# Patient Record
Sex: Female | Born: 1950 | Race: White | Hispanic: No | Marital: Married | Smoking: Never smoker
Health system: Southern US, Community
[De-identification: ages and names within clinical notes are randomized; demographics above are authoritative.]

## PROBLEM LIST (undated history)

## (undated) DIAGNOSIS — F32A Depression, unspecified: Secondary | ICD-10-CM

## (undated) DIAGNOSIS — E785 Hyperlipidemia, unspecified: Secondary | ICD-10-CM

## (undated) DIAGNOSIS — R5383 Other fatigue: Secondary | ICD-10-CM

## (undated) DIAGNOSIS — I2510 Atherosclerotic heart disease of native coronary artery without angina pectoris: Secondary | ICD-10-CM

## (undated) DIAGNOSIS — I82409 Acute embolism and thrombosis of unspecified deep veins of unspecified lower extremity: Secondary | ICD-10-CM

## (undated) DIAGNOSIS — M797 Fibromyalgia: Secondary | ICD-10-CM

## (undated) DIAGNOSIS — I2699 Other pulmonary embolism without acute cor pulmonale: Secondary | ICD-10-CM

## (undated) DIAGNOSIS — F419 Anxiety disorder, unspecified: Secondary | ICD-10-CM

## (undated) DIAGNOSIS — F329 Major depressive disorder, single episode, unspecified: Secondary | ICD-10-CM

## (undated) DIAGNOSIS — F4310 Post-traumatic stress disorder, unspecified: Secondary | ICD-10-CM

## (undated) DIAGNOSIS — F988 Other specified behavioral and emotional disorders with onset usually occurring in childhood and adolescence: Secondary | ICD-10-CM

## (undated) DIAGNOSIS — M62838 Other muscle spasm: Secondary | ICD-10-CM

## (undated) HISTORY — DX: Other muscle spasm: M62.838

## (undated) HISTORY — PX: BUNIONECTOMY: SHX129

## (undated) HISTORY — DX: Other fatigue: R53.83

## (undated) HISTORY — PX: FOOT SURGERY: SHX648

## (undated) HISTORY — DX: Other specified behavioral and emotional disorders with onset usually occurring in childhood and adolescence: F98.8

## (undated) HISTORY — PX: HAND SURGERY: SHX662

## (undated) HISTORY — PX: CARDIAC CATHETERIZATION: SHX172

---

## 1981-09-08 HISTORY — PX: PULMONARY EMBOLISM SURGERY: SHX752

## 1987-09-09 HISTORY — PX: OTHER SURGICAL HISTORY: SHX169

## 2001-04-23 ENCOUNTER — Other Ambulatory Visit: Admission: RE | Admit: 2001-04-23 | Discharge: 2001-04-23 | Payer: Self-pay | Admitting: *Deleted

## 2002-03-22 ENCOUNTER — Encounter: Admission: RE | Admit: 2002-03-22 | Discharge: 2002-03-22 | Payer: Self-pay | Admitting: *Deleted

## 2002-10-13 ENCOUNTER — Other Ambulatory Visit: Admission: RE | Admit: 2002-10-13 | Discharge: 2002-10-13 | Payer: Self-pay | Admitting: *Deleted

## 2003-08-23 ENCOUNTER — Ambulatory Visit (HOSPITAL_COMMUNITY): Admission: RE | Admit: 2003-08-23 | Discharge: 2003-08-23 | Payer: Self-pay | Admitting: Family Medicine

## 2003-12-04 ENCOUNTER — Encounter: Admission: RE | Admit: 2003-12-04 | Discharge: 2003-12-04 | Payer: Self-pay | Admitting: *Deleted

## 2004-01-02 ENCOUNTER — Other Ambulatory Visit: Admission: RE | Admit: 2004-01-02 | Discharge: 2004-01-02 | Payer: Self-pay | Admitting: Pediatrics

## 2004-10-13 ENCOUNTER — Encounter: Admission: RE | Admit: 2004-10-13 | Discharge: 2004-10-13 | Payer: Self-pay | Admitting: Neurology

## 2005-11-26 ENCOUNTER — Encounter: Admission: RE | Admit: 2005-11-26 | Discharge: 2005-11-26 | Payer: Self-pay | Admitting: *Deleted

## 2006-02-26 ENCOUNTER — Ambulatory Visit (HOSPITAL_COMMUNITY): Payer: Self-pay | Admitting: *Deleted

## 2006-04-28 ENCOUNTER — Ambulatory Visit (HOSPITAL_COMMUNITY): Payer: Self-pay | Admitting: *Deleted

## 2006-07-24 ENCOUNTER — Ambulatory Visit (HOSPITAL_COMMUNITY): Payer: Self-pay | Admitting: *Deleted

## 2006-10-20 ENCOUNTER — Ambulatory Visit (HOSPITAL_COMMUNITY): Payer: Self-pay | Admitting: *Deleted

## 2006-12-10 ENCOUNTER — Ambulatory Visit (HOSPITAL_COMMUNITY): Payer: Self-pay | Admitting: *Deleted

## 2007-03-18 ENCOUNTER — Ambulatory Visit (HOSPITAL_COMMUNITY): Payer: Self-pay | Admitting: *Deleted

## 2007-05-25 ENCOUNTER — Ambulatory Visit (HOSPITAL_COMMUNITY): Payer: Self-pay | Admitting: *Deleted

## 2007-10-04 ENCOUNTER — Emergency Department (HOSPITAL_COMMUNITY): Admission: EM | Admit: 2007-10-04 | Discharge: 2007-10-04 | Payer: Self-pay | Admitting: Emergency Medicine

## 2008-03-09 ENCOUNTER — Ambulatory Visit (HOSPITAL_COMMUNITY): Payer: Self-pay | Admitting: *Deleted

## 2008-07-13 ENCOUNTER — Ambulatory Visit (HOSPITAL_COMMUNITY): Payer: Self-pay | Admitting: *Deleted

## 2008-11-09 ENCOUNTER — Ambulatory Visit (HOSPITAL_COMMUNITY): Payer: Self-pay | Admitting: *Deleted

## 2009-06-18 ENCOUNTER — Ambulatory Visit (HOSPITAL_COMMUNITY): Payer: Self-pay | Admitting: Psychiatry

## 2009-07-18 ENCOUNTER — Ambulatory Visit (HOSPITAL_COMMUNITY): Payer: Self-pay | Admitting: Psychiatry

## 2009-08-22 ENCOUNTER — Ambulatory Visit (HOSPITAL_COMMUNITY): Payer: Self-pay | Admitting: Psychiatry

## 2009-11-22 ENCOUNTER — Other Ambulatory Visit: Admission: RE | Admit: 2009-11-22 | Discharge: 2009-11-22 | Payer: Self-pay | Admitting: Family Medicine

## 2012-11-01 DIAGNOSIS — F331 Major depressive disorder, recurrent, moderate: Secondary | ICD-10-CM

## 2012-11-01 DIAGNOSIS — F411 Generalized anxiety disorder: Secondary | ICD-10-CM

## 2012-11-08 DIAGNOSIS — F411 Generalized anxiety disorder: Secondary | ICD-10-CM

## 2012-11-08 DIAGNOSIS — F331 Major depressive disorder, recurrent, moderate: Secondary | ICD-10-CM

## 2012-11-08 DIAGNOSIS — R413 Other amnesia: Secondary | ICD-10-CM

## 2013-01-23 ENCOUNTER — Emergency Department (HOSPITAL_COMMUNITY)
Admission: EM | Admit: 2013-01-23 | Discharge: 2013-01-24 | Disposition: A | Discharge status: Home / Self Care | Payer: Medicare PPO | Attending: Emergency Medicine | Admitting: Emergency Medicine

## 2013-01-23 ENCOUNTER — Emergency Department (HOSPITAL_COMMUNITY): Payer: Medicare PPO

## 2013-01-23 ENCOUNTER — Encounter (HOSPITAL_COMMUNITY): Payer: Self-pay | Admitting: Emergency Medicine

## 2013-01-23 DIAGNOSIS — F4310 Post-traumatic stress disorder, unspecified: Secondary | ICD-10-CM | POA: Insufficient documentation

## 2013-01-23 DIAGNOSIS — Z86718 Personal history of other venous thrombosis and embolism: Secondary | ICD-10-CM | POA: Insufficient documentation

## 2013-01-23 DIAGNOSIS — X58XXXA Exposure to other specified factors, initial encounter: Secondary | ICD-10-CM | POA: Insufficient documentation

## 2013-01-23 DIAGNOSIS — F329 Major depressive disorder, single episode, unspecified: Secondary | ICD-10-CM | POA: Insufficient documentation

## 2013-01-23 DIAGNOSIS — S0990XA Unspecified injury of head, initial encounter: Secondary | ICD-10-CM | POA: Insufficient documentation

## 2013-01-23 DIAGNOSIS — Y999 Unspecified external cause status: Secondary | ICD-10-CM | POA: Insufficient documentation

## 2013-01-23 DIAGNOSIS — R51 Headache: Secondary | ICD-10-CM | POA: Insufficient documentation

## 2013-01-23 DIAGNOSIS — F411 Generalized anxiety disorder: Secondary | ICD-10-CM | POA: Insufficient documentation

## 2013-01-23 DIAGNOSIS — M545 Low back pain, unspecified: Secondary | ICD-10-CM | POA: Insufficient documentation

## 2013-01-23 DIAGNOSIS — Y92009 Unspecified place in unspecified non-institutional (private) residence as the place of occurrence of the external cause: Secondary | ICD-10-CM | POA: Insufficient documentation

## 2013-01-23 DIAGNOSIS — Z86711 Personal history of pulmonary embolism: Secondary | ICD-10-CM | POA: Insufficient documentation

## 2013-01-23 DIAGNOSIS — IMO0001 Reserved for inherently not codable concepts without codable children: Secondary | ICD-10-CM | POA: Insufficient documentation

## 2013-01-23 DIAGNOSIS — F3289 Other specified depressive episodes: Secondary | ICD-10-CM | POA: Insufficient documentation

## 2013-01-23 HISTORY — DX: Post-traumatic stress disorder, unspecified: F43.10

## 2013-01-23 HISTORY — DX: Depression, unspecified: F32.A

## 2013-01-23 HISTORY — DX: Other pulmonary embolism without acute cor pulmonale: I26.99

## 2013-01-23 HISTORY — DX: Anxiety disorder, unspecified: F41.9

## 2013-01-23 HISTORY — DX: Fibromyalgia: M79.7

## 2013-01-23 HISTORY — DX: Acute embolism and thrombosis of unspecified deep veins of unspecified lower extremity: I82.409

## 2013-01-23 HISTORY — DX: Major depressive disorder, single episode, unspecified: F32.9

## 2013-01-23 LAB — COMPREHENSIVE METABOLIC PANEL
ALT: 23 U/L (ref 0–35)
AST: 22 U/L (ref 0–37)
Albumin: 4.8 g/dL (ref 3.5–5.2)
Alkaline Phosphatase: 65 U/L (ref 39–117)
BUN: 21 mg/dL (ref 6–23)
CO2: 27 mEq/L (ref 19–32)
Calcium: 10.5 mg/dL (ref 8.4–10.5)
Chloride: 100 mEq/L (ref 96–112)
Creatinine, Ser: 0.73 mg/dL (ref 0.50–1.10)
GFR calc Af Amer: >90 mL/min (ref >90)
GFR calc non Af Amer: >90 mL/min (ref >90)
Glucose, Bld: 89 mg/dL (ref 70–99)
Potassium: 4.1 mEq/L (ref 3.5–5.1)
Sodium: 138 mEq/L (ref 135–145)
Total Bilirubin: 0.4 mg/dL (ref 0.3–1.2)
Total Protein: 7.4 g/dL (ref 6.0–8.3)

## 2013-01-23 LAB — CBC WITH DIFFERENTIAL/PLATELET
Basophils Absolute: 0 10*3/uL (ref 0.0–0.1)
Basophils Relative: 0 % (ref 0–1)
Eosinophils Absolute: 0.1 10*3/uL (ref 0.0–0.7)
Eosinophils Relative: 2 % (ref 0–5)
HCT: 40.8 % (ref 36.0–46.0)
Hemoglobin: 14.1 g/dL (ref 12.0–15.0)
Lymphocytes Relative: 13 % (ref 12–46)
Lymphs Abs: 0.9 10*3/uL (ref 0.7–4.0)
MCH: 31.1 pg (ref 26.0–34.0)
MCHC: 34.6 g/dL (ref 30.0–36.0)
MCV: 89.9 fL (ref 78.0–100.0)
Monocytes Absolute: 0.5 10*3/uL (ref 0.1–1.0)
Monocytes Relative: 8 % (ref 3–12)
Neutro Abs: 5.4 10*3/uL (ref 1.7–7.7)
Neutrophils Relative %: 78 % — ABNORMAL HIGH (ref 43–77)
Platelets: 231 10*3/uL (ref 150–400)
RBC: 4.54 MIL/uL (ref 3.87–5.11)
RDW: 12.5 % (ref 11.5–15.5)
WBC: 6.9 10*3/uL (ref 4.0–10.5)

## 2013-01-23 LAB — POCT I-STAT TROPONIN I: Troponin i, poc: 0 ng/mL (ref 0.00–0.08)

## 2013-01-23 MED ORDER — HYDROCODONE-ACETAMINOPHEN 5-325 MG PO TABS: 1.0000 | ORAL_TABLET | ORAL | Status: DC | PRN

## 2013-01-23 MED ORDER — HYDROCODONE-ACETAMINOPHEN 5-325 MG PO TABS
1.0000 | ORAL_TABLET | Freq: Once | ORAL | Status: DC
  Filled 2013-01-23: qty 1

## 2013-01-23 NOTE — ED Notes (Signed)
Family checking pt in, pt not with family at registration check in, pt with other family in b/r.

## 2013-01-23 NOTE — Progress Notes (Signed)
62 yo woman went out to ride her horse, has no recall of what happened, but apparently was unconscious, feels pain in back of head.  She was wearing a helmet and vest.  No vomiting.  Initially confused with no recall of what happened for several days in the past, but now her memory is coming back.  Exam shows no deformity of skull or neck, nonfocal neurologic exam.  I feel she has had a closed head injury from which she is recoviering.  Where her tests are negative, it is safe to go home, to recover from her head injury there.

## 2013-01-23 NOTE — Progress Notes (Signed)
10:10 PM  Date: 01/23/2013  Rate: 83  Rhythm: normal sinus rhythm  QRS Axis: normal  Intervals: normal  ST/T Wave abnormalities: nonspecific T wave changes  Conduction Disutrbances:none  Narrative Interpretation: Abnormal EKG  Old EKG Reviewed: none available

## 2013-01-23 NOTE — ED Notes (Addendum)
Family received a call from pt "atleast 1 hour ago" stating that she didn't know where she was and how she got there.  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  Pt c/o pain to L lower back but unsure if she fell.  Pt was found kneeling on ground with her helmet on.  Pt doesn't remember why she was there and no evidence that pt fell off horse.  Pt's dog died 2 weeks ago and she doesn't remember that happening.  Last known well at 4:30pm by husband.  Pt alert and oriented at this time but states she doesn't remember what happened.  No neuro deficits noted on triage exam. Pt crying. Denies neck pain.  Pt pulled straight to treatment room for triage and Lanora Manis, RN notified of pt's complaint.

## 2013-01-23 NOTE — ED Provider Notes (Signed)
History     CSN: [id number]  Arrival date & time 01/23/13  2051   First MD Initiated Contact with Patient 01/23/13 2057      Chief Complaint  Patient presents with  . Back Pain  . Altered Mental Status    (Consider location/radiation/quality/duration/timing/severity/associated sxs/prior treatment) HPI Comments: Patient brought in today by family due to altered mental status.  Patient's husband reports that at 4:30 PM this evening the patient left the home by herself to go to the barn and ride her horse.  He reports that at 6:30 PM this evening he received a call from the patient.  She was at the barn, but was unsure where she was or how she got there.  She was also complaining of a headache and lower back pain at that time.  Other people that were at the barn at that time told the husband that they saw the patient lying on the ground with her helmet on.  They were unsure if she had fallen off of the horse or not.  Patient does not remember riding the horse or falling off of the horse.  She is unsure if she loss consciousness.  She denies having a headache at this time.  She denies nausea, vomiting, or vision changes.  She denies any focal weakness.  No numbness or tingling.  No neck pain.  No difficulty speaking or swallowing.  No fever or chills.  She reports that she has never had symptoms like this before.   No history of TIA or CVA.  Patient is currently not on any blood thinning medications.  The history is provided by the patient.    Past Medical History  Diagnosis Date  . Fibromyalgia   . PTSD (post-traumatic stress disorder)   . Anxiety   . Depression   . DVT (deep venous thrombosis)   . PE (pulmonary embolism)     History reviewed. No pertinent past surgical history.  No family history on file.  History  Substance Use Topics  . Smoking status: Never Smoker   . Smokeless tobacco: Not on file  . Alcohol Use: Yes     Comment: wine    OB History   Grav Para Term  Preterm Abortions TAB SAB Ect Mult Living                  Review of Systems  Constitutional: Negative for fever and chills.  Eyes: Negative for visual disturbance.  Cardiovascular: Negative for chest pain.  Gastrointestinal: Negative for nausea and vomiting.  Musculoskeletal: Positive for back pain.  Neurological: Positive for headaches.  Psychiatric/Behavioral: Positive for confusion.  All other systems reviewed and are negative.    Allergies  Review of patient's allergies indicates not on file.  Home Medications  No current outpatient prescriptions on file.  BP 167/77  Pulse 75  Temp(Src) 98.4 F (36.9 C) (Oral)  Resp 9  SpO2 100%  Physical Exam  Nursing note and vitals reviewed. Constitutional: She is oriented to person, place, and time. She appears well-developed and well-nourished.  HENT:  Head: Normocephalic and atraumatic.  Mouth/Throat: Oropharynx is clear and moist.  Eyes: EOM are normal. Pupils are equal, round, and reactive to light.  Neck: Normal range of motion. Neck supple.  Cardiovascular: Normal rate, regular rhythm and normal heart sounds.   Pulmonary/Chest: Effort normal and breath sounds normal.  Musculoskeletal:       Cervical back: She exhibits normal range of motion, no tenderness, no bony tenderness, no  swelling, no edema and no deformity.       Thoracic back: She exhibits normal range of motion, no tenderness, no bony tenderness, no swelling, no edema and no deformity.       Lumbar back: She exhibits tenderness and bony tenderness. She exhibits normal range of motion, no swelling, no edema and no deformity.  Neurological: She is alert and oriented to person, place, and time. She has normal strength. No cranial nerve deficit or sensory deficit. She displays a negative Romberg sign. Coordination and gait normal.  Skin: Skin is warm and dry.  Psychiatric: She has a normal mood and affect.    ED Course  Procedures (including critical care  time)  Labs Reviewed  CBC WITH DIFFERENTIAL - Abnormal; Notable for the following:    Neutrophils Relative % 78 (*)    All other components within normal limits  COMPREHENSIVE METABOLIC PANEL  URINALYSIS, ROUTINE W REFLEX MICROSCOPIC  POCT I-STAT TROPONIN I   Dg Chest 2 View  01/23/2013   *RADIOLOGY REPORT*  Clinical Data: Altered mental status.  CHEST - 2 VIEW  Comparison: 10/04/2007.  Findings: The cardiac silhouette, mediastinal and hilar contours are normal and stable.  The lungs are clear.  No pleural effusion. The bony thorax is intact.  IMPRESSION: No acute cardiopulmonary findings.   Original Report Authenticated By: Rudie Meyer, M.D.   Dg Lumbar Spine Complete  01/23/2013   *RADIOLOGY REPORT*  Clinical Data: Low back pain.  LUMBAR SPINE - COMPLETE 4+ VIEW  Comparison: None.  Findings: No evidence of acute fracture, spondylolysis, or spondylolisthesis.  Intervertebral disc spaces are maintained.  Mild vertebral osteophytosis is noted at several levels.  Mild bilateral facet DJD noted at L5-S1.  IMPRESSION:  1.  No acute findings. 2.  Mild lumbar degenerative spondylosis.   Original Report Authenticated By: Myles Rosenthal, M.D.   Dg Pelvis 1-2 Views  01/23/2013   *RADIOLOGY REPORT*  Clinical Data: Back pain.  PELVIS - 1-2 VIEW  Comparison: None  Findings: The hips are normally located.  No acute fracture.  No plain film evidence of avascular necrosis.  The pubic symphysis and SI joints are intact.  No obvious sacral fracture.  Enthesopathic changes are noted.  IMPRESSION: No acute bony findings.   Original Report Authenticated By: Rudie Meyer, M.D.   Ct Head Wo Contrast  01/23/2013   *RADIOLOGY REPORT*  Clinical Data: Altered mental status.  CT HEAD WITHOUT CONTRAST  Technique:  Contiguous axial images were obtained from the base of the skull through the vertex without contrast.  Comparison: MRI brain 2006.  Findings: Stable mild ventriculomegaly and slight age advanced cerebral atrophy.   Stable periventricular white matter disease.  No extra-axial fluid collections are identified.  No CT findings for acute hemispheric infarction and/or intracranial hemorrhage.  No mass lesions.  The brainstem and cerebellum grossly normal and stable.  The bony structures are intact.  The paranasal sinuses and mastoid air cells are grossly clear.  Globes are intact.  IMPRESSION:  1.  No acute intracranial findings or mass lesions. 2.  Mild atrophy, ventriculomegaly and periventricular white matter disease.   Original Report Authenticated By: Rudie Meyer, M.D.     No diagnosis found.    MDM  Patient found to be confused earlier today.  Confusion appears to be resolving during ED course.  Suspect that the patient may have sustained a head injury while riding her horse earlier today.  Head CT negative.   Patient not on any blood thinning  medications.  No obvious signs of head trauma.  Labs unremarkable.  Patient has a normal neurological exam at this time.  No vomiting or changes in vision.  Feel that the patient is stable for discharge.  Return precautions given.        Pascal Lux [place], PA-C 01/25/13 [phone]

## 2013-01-24 MED ORDER — ACETAMINOPHEN 325 MG PO TABS
650.0000 mg | ORAL_TABLET | Freq: Once | ORAL | Status: AC
  Administered 2013-01-24: 650 mg via ORAL
  Filled 2013-01-24: qty 2

## 2013-01-24 NOTE — ED Notes (Signed)
Pt or family denies any further questions, verbalized understanding of follow up instructions.

## 2013-01-25 NOTE — ED Provider Notes (Signed)
Medical screening examination/treatment/procedure(s) were conducted as a shared visit with non-physician practitioner(s) and myself.  I personally evaluated the patient during the encounter 62 yo woman went out to ride her horse, has no recall of what happened, but apparently was unconscious, feels pain in back of head. She was wearing a helmet and vest. No vomiting. Initially confused with no recall of what happened for several days in the past, but now her memory is coming back. Exam shows no deformity of skull or neck, nonfocal neurologic exam. I feel she has had a closed head injury from which she is recoviering. Where her tests are negative, it is safe to go home, to recover from her head injury there.       Carleene Cooper III, MD 01/25/13 1416

## 2013-02-03 ENCOUNTER — Encounter: Payer: Self-pay | Admitting: Neurology

## 2013-02-04 ENCOUNTER — Institutional Professional Consult (permissible substitution): Payer: Medicare PPO | Admitting: Neurology

## 2013-02-07 ENCOUNTER — Telehealth: Payer: Self-pay | Admitting: Neurology

## 2013-02-07 ENCOUNTER — Encounter: Payer: Self-pay | Admitting: Neurology

## 2013-02-07 ENCOUNTER — Ambulatory Visit (INDEPENDENT_AMBULATORY_CARE_PROVIDER_SITE_OTHER): Payer: Medicare PPO | Admitting: Neurology

## 2013-02-07 VITALS — BP 117/72 | HR 82 | Temp 98.7°F | Ht 61.0 in | Wt 128.0 lb

## 2013-02-07 DIAGNOSIS — R41844 Frontal lobe and executive function deficit: Secondary | ICD-10-CM

## 2013-02-07 NOTE — Telephone Encounter (Signed)
I called and spoke with patient concerning driving. I informed the patient that I spoke with Dr. Vickey Huger and she stated if she's confused then she shouldn't be driving. Patient stated she is not confused and she that she could drive to the grocery store

## 2013-02-07 NOTE — Patient Instructions (Signed)

## 2013-02-07 NOTE — Progress Notes (Signed)
[facility]  Provider:  Dr Vickey Huger Referring Provider: Primary Care Physician:  Allean Found, MD  No chief complaint on file.   HPI:  Diane Richardson is a caucasian  62 y.o. female ,  disabled due to anxiety and depression for  Many years before her numeric retirement age. She is here as a referral from Dr. Katrinka Blazing , the patient had been referred on 08/30/2012  for a detailed neuropsychological testing to Dr. Leonides Cave at the rehabilitation center for  [facility].  I have signed off this patient's care at that point.  The report was received and of March 2014. Dr. Leonides Cave  mentioned that the patient missed her initial appointment was in and was 35 minutes late for a second recheck appointment. She is 25  minutes late at the time of arrival to my appointment.,too. Dr. Evelene Croon  is the patient's psychiatrist discontinued fluoxetine after receiving Dr. Lindaann Slough neuropsychological test report. Dr. Gladis Riffle diagnostic impressions were that the patient has problems with attentional control and executive functioning this may represent an exacerbation of an obsessive-compulsive or depressive condition cultivated alternatively,  he would like her from a neurological prospective to be evaluated for frontal lobe dysfunction. This should be done today.  It is late in today s delayed appointment that I learn abo out a hospital evaluation last May ( last Month ).   MOCA here is 28 points out of 30.  Words with AF :  22 words. MMSE 29 points, AFT 22  Points  .  The patient was seen for a fall with a resulting possible common percussion in the  emergency department - CT non contrast was  Non- diagnostic for concussion but showed brain atrophy. . The patient cannot remember the circumstance of the fall, she speaks extremely circumferential, tangential and  Has had  short term memory loss for hours after the fall.      I suggest a MRI brian with contrast.         Review of Systems: Out of  a complete 14 system review, the patient complains of only the following symptoms, and all other reviewed systems are negative.  Back pain, fold, confusion after the fall was suspected head injury. I'm knees are for the period of the fall and what led to the fall.    History   Social History  . Marital Status: Married    Spouse Name: N/A    Number of Children: 1  . Years of Education: PHD   Occupational History  . disabled     FORMER TEACHER   Social History Main Topics  . Smoking status: Never Smoker   . Smokeless tobacco: Not on file  . Alcohol Use: Yes     Comment: 3 glasses of wine weekly  . Drug Use: No  . Sexually Active: Not on file   Other Topics Concern  . Not on file   Social History Narrative  . No narrative on file    Family History  Problem Relation Age of Onset  . CAD Father   . Hyperlipidemia Father   . Hypertension Father   . Hyperlipidemia Brother   . Diabetes Maternal Grandmother   . Cancer Maternal Grandmother     stomach  . Dementia Maternal Grandfather   . Colon polyps Maternal Grandfather   . CAD Paternal Grandmother   . Diabetes Paternal Grandmother   . Dementia Paternal Grandfather     Past Medical History  Diagnosis Date  . Fibromyalgia   .  PTSD (post-traumatic stress disorder)   . Anxiety   . Depression   . DVT (deep venous thrombosis)   . PE (pulmonary embolism)   . ADD (attention deficit disorder)   . Muscle spasm     bad  . Fatigue     chronic    Past Surgical History  Procedure Laterality Date  . Childbirth  1989    x1,NVD  . Pulmonary embolism surgery  1983    x9 days    Current Outpatient Prescriptions  Medication Sig Dispense Refill  . amphetamine-dextroamphetamine (ADDERALL) 20 MG tablet Take 20 mg by mouth daily.      Marland Kitchen aspirin (ECOTRIN) 325 MG EC tablet Take 325 mg by mouth daily. Delayed release, one tablet as needed once daily      . celecoxib (CELEBREX) 200 MG capsule Take 200 mg by mouth 2 (two) times  daily. One capsule  daily      . Cholecalciferol (VITAMIN D-3 PO) Take 400 Units by mouth. One tablet orally twice daily      . Coenzyme Q10 (COQ-10) 50 MG CAPS Take by mouth daily. One capsule with a meal      . DULoxetine HCl (CYMBALTA PO) Take 60 mg by mouth daily.      . fish oil-omega-3 fatty acids 1000 MG capsule Take by mouth daily. 3 340mg , delayed release, one capsule with meals orally three times daily      . HYDROcodone-acetaminophen (NORCO/VICODIN) 5-325 MG per tablet Take 1 tablet by mouth every 4 (four) hours as needed for pain.  20 tablet  0  . LORazepam (ATIVAN) 0.5 MG tablet Take 0.5 mg by mouth daily. 1 1/2 tablet orally      . Magnesium 65 MG TABS Take 65 mg by mouth daily.      . metaxalone (SKELAXIN) 800 MG tablet Take 800 mg by mouth 3 (three) times daily. As needed for muscle spasm      . pregabalin (LYRICA) 150 MG capsule Take 150 mg by mouth 2 (two) times daily.      Marland Kitchen thiamine (VITAMIN B-1) 50 MG tablet Take 50 mg by mouth daily.       No current facility-administered medications for this visit.    Allergies as of 02/07/2013 - Review Complete 02/03/2013  Allergen Reaction Noted  . Codeine  02/03/2013  . Donnatal (belladonna alk-phenobarb er)  02/03/2013  . Penicillins Hives and Diarrhea 01/23/2013  . Percocet (oxycodone-acetaminophen)  02/03/2013  . Percodan (oxycodone-aspirin)  02/03/2013    Vitals: There were no vitals taken for this visit. Last Weight:  Wt Readings from Last 1 Encounters:  02/07/13 127 lb (57.607 kg)   Last Height:   Ht Readings from Last 1 Encounters:  02/07/13 5' 0.5" (1.537 m)   Vision Screening:    Physical exam:  General: The patient is awake, alert and appears not in acute distress. The patient is well groomed. Head: Normocephalic, atraumatic. Neck is supple. Mallampati2, neck circumference:14.5 inches  Cardiovascular:  Regular rate and rhythm , without  murmurs or carotid bruit, and without distended neck  veins. Respiratory: Lungs are clear to auscultation. Skin:  Without evidence of edema, or rash Trunk: BMI is  normal , as is posture.  Neurologic exam : The patient is awake and alert, oriented to place and time.  Memory subjective  described as impaired , significant problems with attention.  Abnormal function in March / April testing with Dr Leonides Cave. . There is a impaired  attention span &  concentration ability.  She was a unable to recall that her dog died 14 days prior  To the fall, and after the fall began looking for it. Speech is fluent logorrheic, no  dysarthria, dysphonia. Mood and affect are  aloof . Cranial nerves: Pupils are equal and briskly reactive to light. Funduscopic exam without  evidence of pallor or edema. Extraocular movements  in vertical and horizontal planes intact and without nystagmus. Visual fields by finger perimetry are intact. Hearing to finger rub intact.  Facial sensation intact to fine touch. Facial motor strength is symmetric and tongue and uvula move midline.  Motor exam:   Normal tone and normal muscle bulk and symmetric normal strength in all extremities.  Sensory:  Fine touch, pinprick and vibration were tested in all extremities. Proprioception is tested in the upper extremities only. This was  normal.  Coordination: Rapid alternating movements in the fingers/hands is tested and normal. Finger-to-nose maneuver tested and normal without evidence of ataxia, dysmetria or tremor.  Gait and station: Patient walks without assistive device and is able and assisted stool climb up to the exam table. Strength within normal limits. Stance is stable and normal.  Steps are unfragmented. Romberg testing is normal. Tandem is  Quick and unimpaired. She turned with a pirouette an only 2 steps.   Deep tendon reflexes: in the  upper and lower extremities are symmetric and intact. Babinski maneuver response is  downgoing.   Assessment:  After physical and neurologic  examination, review of laboratory studies, imaging, neurophysiology testing and pre-existing records, assessment will be reviewed on the problem list.  Review of both memory tests performed here today showed that she only missed 1/5 recall items, and was unable to read a list of letters on the [facility]  , MMSE - complete  ability to spell a word backwards, and she was only missing a point  unable to give the exact address of the building .  Verbal acuity was  good.     I will order an MRI brain for frontotemporal evaluation . I find Diane Richardson's mental status nearly unchanged after 7 years. It is possible that the patient suffered a concussion given her confusion after the fall. The CT did not show any evidence of bleed or petechiae. Her blood test showed normal white and red cell counts, normal liver and kidney function as well as TSH been normal vitamin B12 being normal.

## 2013-02-16 ENCOUNTER — Ambulatory Visit
Admission: RE | Admit: 2013-02-16 | Discharge: 2013-02-16 | Disposition: A | Discharge status: Home / Self Care | Payer: Medicare PPO | Source: Ambulatory Visit | Attending: Neurology | Admitting: Neurology

## 2013-02-16 DIAGNOSIS — R41844 Frontal lobe and executive function deficit: Secondary | ICD-10-CM

## 2013-02-16 MED ORDER — GADOBENATE DIMEGLUMINE 529 MG/ML IV SOLN
12.0000 mL | Freq: Once | INTRAVENOUS | Status: AC | PRN
  Administered 2013-02-16: 12 mL via INTRAVENOUS

## 2013-02-19 NOTE — Progress Notes (Signed)
Quick Note:  Please call patient with essentially normal results- unchanged brain MRI over 8 years. CD ______

## 2013-05-12 ENCOUNTER — Ambulatory Visit (INDEPENDENT_AMBULATORY_CARE_PROVIDER_SITE_OTHER): Payer: Medicare PPO | Admitting: Nurse Practitioner

## 2013-05-12 ENCOUNTER — Encounter: Payer: Self-pay | Admitting: Nurse Practitioner

## 2013-05-12 VITALS — BP 121/72 | HR 90 | Ht 60.0 in | Wt 126.0 lb

## 2013-05-12 DIAGNOSIS — R410 Disorientation, unspecified: Secondary | ICD-10-CM

## 2013-05-12 NOTE — Patient Instructions (Addendum)
Memory testing is stable MRI without change from 2006 Followup yearly for repeat memory testing

## 2013-05-12 NOTE — Progress Notes (Signed)
Reason for visit followup for for confusional state and memory HPI: Diane Richardson, 62 y.o. female , disabled due to anxiety and depression for many years before her numeric retirement age. She was last seen by Dr. Vickey Huger 02/07/13. She was  referred  from Dr. Katrinka Blazing , the patient had been referred on 08/30/2012 for a detailed neuropsychological testing to Dr. Leonides Cave at the rehabilitation center at [facility]. I have signed off this patient's care at that point.  The report was received and of March 2014. Dr. Leonides Cave mentioned that the patient missed her initial appointment was in and was 35 minutes late for a second recheck appointment. She is 25 minutes late at the time of arrival to my appointment.,too.  Dr. Evelene Croon is the patient's psychiatrist discontinued fluoxetine after receiving Dr. Lindaann Slough neuropsychological test report. Diagnostic impressions were that the patient has problems with attentional control and executive functioning this may represent an exacerbation of an obsessive-compulsive or depressive condition cultivated alternatively, he would like her from a neurological prospective to be evaluated for frontal lobe dysfunction.  05/12/13: Patient returns for her followup visit. MRI of the brain 02/07/2013 with no acute findings and essentially unchanged from her MRI of the brain in 2006. The mental status exam performed today is stable. She continues to see Dr. Evelene Croon every 6 weeks to 3 months.      ROS:  14 system review of symptoms is negative except for the following Fatigue, feeling hot fibromyalgia, confusion, depression anxiety and decreased energy   Medications Current Outpatient Prescriptions on File Prior to Visit  Medication Sig Dispense Refill  . amphetamine-dextroamphetamine (ADDERALL) 20 MG tablet Take 20 mg by mouth daily. 20-60mg       . aspirin (ECOTRIN) 325 MG EC tablet Take 325 mg by mouth daily. Delayed release, one tablet as needed once daily      . celecoxib (CELEBREX) 200 MG  capsule Take 200 mg by mouth 2 (two) times daily. One capsule  daily      . Coenzyme Q10 (COQ-10) 50 MG CAPS Take by mouth daily. One capsule with a meal      . DULoxetine HCl (CYMBALTA PO) Take 120 mg by mouth daily.       . fish oil-omega-3 fatty acids 1000 MG capsule Take by mouth daily. 3 340mg , delayed release, one capsule with meals orally three times daily      . LORazepam (ATIVAN) 0.5 MG tablet Take 0.5 mg by mouth daily. 1.5 three times  Daily as needed      . Magnesium 65 MG TABS Take 65 mg by mouth daily.      . metaxalone (SKELAXIN) 800 MG tablet Take 800 mg by mouth 3 (three) times daily. As needed for muscle spasm      . pregabalin (LYRICA) 150 MG capsule Take 150 mg by mouth 2 (two) times daily. As needed      . thiamine (VITAMIN B-1) 50 MG tablet Take 50 mg by mouth daily.       No current facility-administered medications on file prior to visit.    Allergies  Allergies  Allergen Reactions  . Codeine     hives  . Donnatal [Belladonna Alk-Phenobarb Er]     hives  . Penicillins Hives and Diarrhea  . Percocet [Oxycodone-Acetaminophen]     5-325mg ,delusional  . Percodan [Oxycodone-Aspirin]     delusional    Physical Exam General: well developed, well nourished, seated, in no evident distress Head: head normocephalic and atraumatic. Oropharynx benign  Neck: supple with no carotid  bruits Cardiovascular: regular rate and rhythm, no murmurs  Neurologic Exam Mental Status: Awake and fully alert. Oriented to place and time. MMSE 29/30 . AFT 20. Follows all commands. Speech and language normal.   Cranial Nerves: Pupils equal, briskly reactive to light. Extraocular movements full without nystagmus. Visual fields full to confrontation. Hearing intact and symmetric to finger snap. Facial sensation intact. Face, tongue, palate move normally and symmetrically. Neck flexion and extension normal.  Motor: Normal bulk and tone. Normal strength in all tested extremity muscles.No focal  weakness Sensory.: intact to touch and pinprick and vibratory.  Coordination: Rapid alternating movements normal in all extremities. Finger-to-nose and heel-to-shin performed accurately bilaterally. Gait and Station: Arises from chair without difficulty. Stance is normal. Able to heel, toe and tandem walk without difficulty.  Reflexes: 2+ and symmetric. Toes downgoing.     ASSESSMENT: Subacute confusional state and memory loss, MRI of the brain with and without contrast 02/07/2013 with nothing  acute, and without changes from 2006     PLAN: Followup yearly for repeat memory testing  Nilda Riggs, GNP-BC APRN

## 2013-10-01 ENCOUNTER — Emergency Department (HOSPITAL_COMMUNITY): Payer: Medicare PPO

## 2013-10-01 ENCOUNTER — Encounter (HOSPITAL_COMMUNITY): Payer: Self-pay | Admitting: Emergency Medicine

## 2013-10-01 ENCOUNTER — Emergency Department (HOSPITAL_COMMUNITY)
Admission: EM | Admit: 2013-10-01 | Discharge: 2013-10-01 | Disposition: A | Discharge status: Home / Self Care | Payer: Medicare PPO | Attending: Emergency Medicine | Admitting: Emergency Medicine

## 2013-10-01 DIAGNOSIS — R5383 Other fatigue: Secondary | ICD-10-CM

## 2013-10-01 DIAGNOSIS — Y9289 Other specified places as the place of occurrence of the external cause: Secondary | ICD-10-CM | POA: Insufficient documentation

## 2013-10-01 DIAGNOSIS — Y9301 Activity, walking, marching and hiking: Secondary | ICD-10-CM | POA: Insufficient documentation

## 2013-10-01 DIAGNOSIS — IMO0001 Reserved for inherently not codable concepts without codable children: Secondary | ICD-10-CM | POA: Insufficient documentation

## 2013-10-01 DIAGNOSIS — S00209A Unspecified superficial injury of unspecified eyelid and periocular area, initial encounter: Secondary | ICD-10-CM | POA: Diagnosis present

## 2013-10-01 DIAGNOSIS — Z885 Allergy status to narcotic agent status: Secondary | ICD-10-CM | POA: Insufficient documentation

## 2013-10-01 DIAGNOSIS — S01119A Laceration without foreign body of unspecified eyelid and periocular area, initial encounter: Secondary | ICD-10-CM | POA: Insufficient documentation

## 2013-10-01 DIAGNOSIS — Z86718 Personal history of other venous thrombosis and embolism: Secondary | ICD-10-CM | POA: Diagnosis not present

## 2013-10-01 DIAGNOSIS — IMO0002 Reserved for concepts with insufficient information to code with codable children: Secondary | ICD-10-CM | POA: Diagnosis not present

## 2013-10-01 DIAGNOSIS — Z23 Encounter for immunization: Secondary | ICD-10-CM | POA: Insufficient documentation

## 2013-10-01 DIAGNOSIS — F329 Major depressive disorder, single episode, unspecified: Secondary | ICD-10-CM | POA: Insufficient documentation

## 2013-10-01 DIAGNOSIS — F411 Generalized anxiety disorder: Secondary | ICD-10-CM | POA: Insufficient documentation

## 2013-10-01 DIAGNOSIS — Z79899 Other long term (current) drug therapy: Secondary | ICD-10-CM | POA: Diagnosis not present

## 2013-10-01 DIAGNOSIS — F3289 Other specified depressive episodes: Secondary | ICD-10-CM | POA: Diagnosis not present

## 2013-10-01 DIAGNOSIS — M62838 Other muscle spasm: Secondary | ICD-10-CM | POA: Insufficient documentation

## 2013-10-01 DIAGNOSIS — F4310 Post-traumatic stress disorder, unspecified: Secondary | ICD-10-CM | POA: Diagnosis not present

## 2013-10-01 DIAGNOSIS — R5381 Other malaise: Secondary | ICD-10-CM | POA: Diagnosis not present

## 2013-10-01 DIAGNOSIS — F988 Other specified behavioral and emotional disorders with onset usually occurring in childhood and adolescence: Secondary | ICD-10-CM | POA: Insufficient documentation

## 2013-10-01 DIAGNOSIS — Z880 Allergy status to penicillin: Secondary | ICD-10-CM | POA: Insufficient documentation

## 2013-10-01 DIAGNOSIS — S0181XA Laceration without foreign body of other part of head, initial encounter: Secondary | ICD-10-CM

## 2013-10-01 MED ORDER — TETANUS-DIPHTH-ACELL PERTUSSIS 5-2.5-18.5 LF-MCG/0.5 IM SUSP
0.5000 mL | Freq: Once | INTRAMUSCULAR | Status: AC
  Administered 2013-10-01: 0.5 mL via INTRAMUSCULAR
  Filled 2013-10-01: qty 0.5

## 2013-10-01 MED ORDER — IBUPROFEN 400 MG PO TABS
800.0000 mg | ORAL_TABLET | Freq: Once | ORAL | Status: AC
  Administered 2013-10-01: 800 mg via ORAL
  Filled 2013-10-01: qty 2

## 2013-10-01 NOTE — ED Notes (Signed)
Pt. reported that the cover of a panel board hit her left eye this evening , presents with superficial laceration at left lower eye / no bleeding or blurred vision with mild bleeding .

## 2013-10-01 NOTE — ED Provider Notes (Signed)
Medical screening examination/treatment/procedure(s) were performed by non-physician practitioner and as supervising physician I was immediately available for consultation/collaboration.    Sunnie NielsenBrian Nicolas Sisler, MD 10/01/13 [phone]

## 2013-10-01 NOTE — Discharge Instructions (Signed)
Facial Laceration °A facial laceration is a cut on the face. These injuries can be painful and cause bleeding. Some cuts may need to be closed with stitches (sutures), skin adhesive strips, or wound glue. Cuts usually heal quickly but can leave a scar. It can take 1 2 years for the scar to go away completely. °HOME CARE  °· Only take medicines as told by your doctor. °· Follow your doctor's instructions for wound care. °For Stitches: °· Keep the cut clean and dry. °· If you have a bandage (dressing), change it at least once a day. Change the bandage if it gets wet or dirty, or as told by your doctor. °· Wash the cut with soap and water 2 times a day. Rinse the cut with water. Pat it dry with a clean towel. °· Put a thin layer of medicated cream on the cut as told by your doctor. °· You may shower after the first 24 hours. Do not soak the cut in water until the stitches are removed. °· Have your stitches removed as told by your doctor. °· Do not wear any makeup until a few days after your stitches are removed. °For Skin Adhesive Strips: °· Keep the cut clean and dry. °· Do not get the strips wet. You may take a bath, but be careful to keep the cut dry. °· If the cut gets wet, pat it dry with a clean towel. °· The strips will fall off on their own. Do not remove the strips that are still stuck to the cut. °For Wound Glue: °· You may shower or take baths. Do not soak or scrub the cut. Do not swim. Avoid heavy sweating until the glue falls off on its own. After a shower or bath, pat the cut dry with a clean towel. °· Do not put medicine or makeup on your cut until the glue falls off. °· If you have a bandage, do not put tape over the glue. °· Avoid lots of sunlight or tanning lamps until the glue falls off. °· The glue will fall off on its own in 5 10 days. Do not pick at the glue. °After Healing: °Put sunscreen on the cut for the first year to reduce your scar. °GET HELP RIGHT AWAY IF:  °· Your cut area gets red,  painful, or puffy (swollen). °· You see a yellowish-white fluid (pus) coming from the cut. °· You have chills or a fever. °MAKE SURE YOU:  °· Understand these instructions. °· Will watch your condition. °· Will get help right away if you are not doing well or get worse. °Document Released: 02/11/2008 Document Revised: 06/15/2013 Document Reviewed: 04/07/2013 °[employer]® Patient Information ©2014 [employer]. ° °

## 2013-10-01 NOTE — ED Provider Notes (Signed)
CSN: [id number]     Arrival date & time 10/01/13  0007 History   First MD Initiated Contact with Patient 10/01/13 0023     Chief Complaint  Patient presents with  . Eye Injury   (Consider location/radiation/quality/duration/timing/severity/associated sxs/prior Treatment) HPI Comments: Diane Richardson states she was [place] a horse [place] her horse got spooked.  At that time.  She was balancing the home for her light switches on the top of her head.  She was sideways, and the panel fell, hitting her just below the left side.  He now has a small laceration.  She did not lose consciousness.  She is not nauseated.  She denies any visual disturbances  Patient is a 63 y.o. female presenting with eye injury. The history is provided by the patient.  Eye Injury This is a new problem. The current episode started today. The problem occurs constantly. The problem has been unchanged. Pertinent negatives include no fever, headaches, neck pain, numbness or weakness. She has tried ice for the symptoms.    Past Medical History  Diagnosis Date  . Fibromyalgia   . PTSD (post-traumatic stress disorder)   . Anxiety   . Depression   . DVT (deep venous thrombosis)   . PE (pulmonary embolism)   . ADD (attention deficit disorder)   . Muscle spasm     bad  . Fatigue     chronic   Past Surgical History  Procedure Laterality Date  . Childbirth  1989    x1,NVD  . Pulmonary embolism surgery  1983    x9 days   Family History  Problem Relation Age of Onset  . CAD Father   . Hyperlipidemia Father   . Hypertension Father   . Hyperlipidemia Brother   . Diabetes Maternal Grandmother   . Cancer Maternal Grandmother     stomach  . Dementia Maternal Grandfather   . Colon polyps Maternal Grandfather   . CAD Paternal Grandmother   . Diabetes Paternal Grandmother   . Dementia Paternal Grandfather    History  Substance Use Topics  . Smoking status: Never Smoker   . Smokeless tobacco: Never Used  . Alcohol Use:  Yes     Comment: 3 glasses of wine weekly   OB History   Grav Para Term Preterm Abortions TAB SAB Ect Mult Living                 Review of Systems  Unable to perform ROS Constitutional: Negative for fever.  HENT: Positive for facial swelling.   Eyes: Negative for redness and visual disturbance.  Musculoskeletal: Negative for neck pain and neck stiffness.  Skin: Positive for wound.  Neurological: Negative for dizziness, weakness, numbness and headaches.  All other systems reviewed and are negative.    Allergies  Codeine; Penicillins; Percocet; Percodan; and Donnatal  Home Medications   Current Outpatient Rx  Name  Route  Sig  Dispense  Refill  . amphetamine-dextroamphetamine (ADDERALL) 10 MG tablet   Oral   Take 10 mg by mouth daily with breakfast. Total daily dose up to 30mg          . amphetamine-dextroamphetamine (ADDERALL) 20 MG tablet   Oral   Take 20 mg by mouth daily. Total daily dose up to 30 mg         . aspirin EC 81 MG tablet   Oral   Take 81 mg by mouth daily.         . Cholecalciferol (VITAMIN D3) 3000 UNITS  TABS   Oral   Take by mouth.         . Coenzyme Q10 (COQ-10) 50 MG CAPS   Oral   Take by mouth daily. One capsule with a meal         . DULoxetine (CYMBALTA) 60 MG capsule   Oral   Take 120 mg by mouth daily.         Marland Kitchen. LORazepam (ATIVAN) 0.5 MG tablet   Oral   Take 0.5 mg by mouth every 8 (eight) hours as needed for anxiety.          . metaxalone (SKELAXIN) 800 MG tablet   Oral   Take 800 mg by mouth 3 (three) times daily. As needed for muscle spasm         . pregabalin (LYRICA) 150 MG capsule   Oral   Take 150 mg by mouth 2 (two) times daily. As needed          BP 149/71  Pulse 88  Temp(Src) 97.5 F (36.4 C) (Oral)  Resp 18  Ht 5\' 1"  (1.549 m)  Wt 125 lb 6 oz (56.87 kg)  BMI 23.70 kg/m2  SpO2 98% Physical Exam  Nursing note reviewed. Constitutional: She is oriented to person, place, and time. She appears  well-developed and well-nourished. No distress.  HENT:  Head: Normocephalic.    Right Ear: External ear normal.  Left Ear: External ear normal.  1cm laceration under L eye  Neck: Normal range of motion. No spinous process tenderness and no muscular tenderness present.  Cardiovascular: Normal rate and regular rhythm.   Pulmonary/Chest: Effort normal.  Musculoskeletal: Normal range of motion.  Neurological: She is alert and oriented to person, place, and time.  Skin: Skin is warm. No erythema.    ED Course  Procedures (including critical care time) Labs Review Labs Reviewed - No data to display Imaging Review Ct Orbitss W/o Cm  10/01/2013   CLINICAL DATA:  Trauma  EXAM: CT ORBITS WITHOUT CONTRAST  TECHNIQUE: Multidetector CT imaging of the orbits was performed following the standard protocol without intravenous contrast.  COMPARISON:  Prior CT from 01/23/2013  FINDINGS: The globes are intact. The bony orbits are intact without evidence of orbital floor fracture. A linear lucency traversing the lateral wall of the left bony orbit on coronal sequence demonstrates a sclerotic margin, and is likely chronic in nature. No retro-orbital hematoma or other abnormality.  The visualized mandible and maxilla are intact. No nasal bone fracture.  Paranasal sinuses are clear. Visualized mastoid air cells are well pneumatized. No soft tissue abnormality.  IMPRESSION: No acute traumatic injury about the orbits. Intact globes. No retro-orbital hematoma.   Electronically Signed   By: Rise MuBenjamin  McClintock M.D.   On: 10/01/2013 01:55    EKG Interpretation   None       MDM   1. Facial laceration    CT is negative for any fractures    Arman FilterGail K Givanni Staron, NP 10/01/13 0207

## 2014-05-12 ENCOUNTER — Telehealth: Payer: Self-pay | Admitting: Nurse Practitioner

## 2014-05-12 ENCOUNTER — Ambulatory Visit: Payer: Medicare PPO | Admitting: Nurse Practitioner

## 2014-05-12 NOTE — Telephone Encounter (Signed)
No showed for scheduled appointment 

## 2014-07-22 ENCOUNTER — Encounter (HOSPITAL_COMMUNITY): Payer: Self-pay | Admitting: *Deleted

## 2014-07-22 ENCOUNTER — Emergency Department (HOSPITAL_COMMUNITY): Payer: BC Managed Care – PPO

## 2014-07-22 ENCOUNTER — Emergency Department (HOSPITAL_COMMUNITY)
Admission: EM | Admit: 2014-07-22 | Discharge: 2014-07-24 | Disposition: A | Discharge status: Other Acute Inpatient Hospital | Payer: BC Managed Care – PPO | Attending: Emergency Medicine | Admitting: Emergency Medicine

## 2014-07-22 DIAGNOSIS — Z7982 Long term (current) use of aspirin: Secondary | ICD-10-CM | POA: Diagnosis not present

## 2014-07-22 DIAGNOSIS — F329 Major depressive disorder, single episode, unspecified: Secondary | ICD-10-CM | POA: Diagnosis not present

## 2014-07-22 DIAGNOSIS — Z008 Encounter for other general examination: Secondary | ICD-10-CM | POA: Diagnosis present

## 2014-07-22 DIAGNOSIS — Z79899 Other long term (current) drug therapy: Secondary | ICD-10-CM | POA: Insufficient documentation

## 2014-07-22 DIAGNOSIS — Z86718 Personal history of other venous thrombosis and embolism: Secondary | ICD-10-CM | POA: Diagnosis not present

## 2014-07-22 DIAGNOSIS — F909 Attention-deficit hyperactivity disorder, unspecified type: Secondary | ICD-10-CM | POA: Diagnosis not present

## 2014-07-22 DIAGNOSIS — Z880 Allergy status to penicillin: Secondary | ICD-10-CM | POA: Diagnosis not present

## 2014-07-22 DIAGNOSIS — M797 Fibromyalgia: Secondary | ICD-10-CM | POA: Insufficient documentation

## 2014-07-22 DIAGNOSIS — F29 Unspecified psychosis not due to a substance or known physiological condition: Secondary | ICD-10-CM | POA: Diagnosis not present

## 2014-07-22 DIAGNOSIS — F419 Anxiety disorder, unspecified: Secondary | ICD-10-CM | POA: Insufficient documentation

## 2014-07-22 DIAGNOSIS — Z86711 Personal history of pulmonary embolism: Secondary | ICD-10-CM | POA: Diagnosis not present

## 2014-07-22 DIAGNOSIS — N390 Urinary tract infection, site not specified: Secondary | ICD-10-CM | POA: Diagnosis not present

## 2014-07-22 DIAGNOSIS — F22 Delusional disorders: Secondary | ICD-10-CM | POA: Diagnosis not present

## 2014-07-22 DIAGNOSIS — Y9289 Other specified places as the place of occurrence of the external cause: Secondary | ICD-10-CM | POA: Insufficient documentation

## 2014-07-22 DIAGNOSIS — Y998 Other external cause status: Secondary | ICD-10-CM | POA: Insufficient documentation

## 2014-07-22 DIAGNOSIS — R410 Disorientation, unspecified: Secondary | ICD-10-CM

## 2014-07-22 DIAGNOSIS — Y9389 Activity, other specified: Secondary | ICD-10-CM | POA: Diagnosis not present

## 2014-07-22 DIAGNOSIS — F05 Delirium due to known physiological condition: Secondary | ICD-10-CM | POA: Diagnosis not present

## 2014-07-22 DIAGNOSIS — W19XXXA Unspecified fall, initial encounter: Secondary | ICD-10-CM

## 2014-07-22 LAB — COMPREHENSIVE METABOLIC PANEL
ALT: 13 U/L (ref 0–35)
AST: 18 U/L (ref 0–37)
Albumin: 4.5 g/dL (ref 3.5–5.2)
Alkaline Phosphatase: 66 U/L (ref 39–117)
Anion gap: 12 (ref 5–15)
BUN: 16 mg/dL (ref 6–23)
CALCIUM: 10.1 mg/dL (ref 8.4–10.5)
CO2: 27 meq/L (ref 19–32)
Chloride: 102 mEq/L (ref 96–112)
Creatinine, Ser: 0.72 mg/dL (ref 0.50–1.10)
GLUCOSE: 102 mg/dL — AB (ref 70–99)
Potassium: 4.7 mEq/L (ref 3.7–5.3)
Sodium: 141 mEq/L (ref 137–147)
Total Bilirubin: 0.4 mg/dL (ref 0.3–1.2)
Total Protein: 7.5 g/dL (ref 6.0–8.3)

## 2014-07-22 LAB — URINALYSIS, ROUTINE W REFLEX MICROSCOPIC
Bilirubin Urine: NEGATIVE
GLUCOSE, UA: NEGATIVE mg/dL
Hgb urine dipstick: NEGATIVE
Ketones, ur: NEGATIVE mg/dL
Nitrite: NEGATIVE
PROTEIN: NEGATIVE mg/dL
Specific Gravity, Urine: 1.013 (ref 1.005–1.030)
Urobilinogen, UA: 0.2 mg/dL (ref 0.0–1.0)
pH: 5.5 (ref 5.0–8.0)

## 2014-07-22 LAB — RAPID URINE DRUG SCREEN, HOSP PERFORMED
Amphetamines: POSITIVE — AB
BARBITURATES: NOT DETECTED
Benzodiazepines: POSITIVE — AB
Cocaine: NOT DETECTED
Opiates: NOT DETECTED
Tetrahydrocannabinol: NOT DETECTED

## 2014-07-22 LAB — CBC WITH DIFFERENTIAL/PLATELET
BASOS PCT: 1 % (ref 0–1)
Basophils Absolute: 0 10*3/uL (ref 0.0–0.1)
EOS PCT: 2 % (ref 0–5)
Eosinophils Absolute: 0.1 10*3/uL (ref 0.0–0.7)
HCT: 39.8 % (ref 36.0–46.0)
Hemoglobin: 13.4 g/dL (ref 12.0–15.0)
LYMPHS ABS: 0.9 10*3/uL (ref 0.7–4.0)
Lymphocytes Relative: 24 % (ref 12–46)
MCH: 30.5 pg (ref 26.0–34.0)
MCHC: 33.7 g/dL (ref 30.0–36.0)
MCV: 90.5 fL (ref 78.0–100.0)
Monocytes Absolute: 0.4 10*3/uL (ref 0.1–1.0)
Monocytes Relative: 11 % (ref 3–12)
Neutro Abs: 2.4 10*3/uL (ref 1.7–7.7)
Neutrophils Relative %: 62 % (ref 43–77)
PLATELETS: 231 10*3/uL (ref 150–400)
RBC: 4.4 MIL/uL (ref 3.87–5.11)
RDW: 12.1 % (ref 11.5–15.5)
WBC: 3.9 10*3/uL — ABNORMAL LOW (ref 4.0–10.5)

## 2014-07-22 LAB — ACETAMINOPHEN LEVEL

## 2014-07-22 LAB — SALICYLATE LEVEL

## 2014-07-22 LAB — URINE MICROSCOPIC-ADD ON

## 2014-07-22 LAB — ETHANOL: Alcohol, Ethyl (B): <11 mg/dL (ref 0–11)

## 2014-07-22 MED ORDER — AMPHETAMINE-DEXTROAMPHETAMINE 10 MG PO TABS
10.0000 mg | ORAL_TABLET | Freq: Every day | ORAL | Status: DC
  Administered 2014-07-23: 10 mg via ORAL
  Filled 2014-07-22: qty 1

## 2014-07-22 MED ORDER — METAXALONE 800 MG PO TABS
800.0000 mg | ORAL_TABLET | Freq: Three times a day (TID) | ORAL | Status: DC | PRN
  Filled 2014-07-22: qty 1

## 2014-07-22 MED ORDER — LORAZEPAM 1 MG PO TABS
1.0000 mg | ORAL_TABLET | Freq: Three times a day (TID) | ORAL | Status: DC | PRN
  Administered 2014-07-22 – 2014-07-23 (×2): 1 mg via ORAL
  Filled 2014-07-22 (×2): qty 1

## 2014-07-22 MED ORDER — ZOLPIDEM TARTRATE 5 MG PO TABS: 5.0000 mg | ORAL_TABLET | Freq: Every evening | ORAL | Status: DC | PRN

## 2014-07-22 MED ORDER — CEPHALEXIN 500 MG PO CAPS
500.0000 mg | ORAL_CAPSULE | Freq: Three times a day (TID) | ORAL | Status: DC
  Administered 2014-07-22 – 2014-07-24 (×5): 500 mg via ORAL
  Filled 2014-07-22 (×6): qty 1

## 2014-07-22 MED ORDER — AMPHETAMINE-DEXTROAMPHETAMINE 20 MG PO TABS
20.0000 mg | ORAL_TABLET | Freq: Every day | ORAL | Status: DC
  Administered 2014-07-23: 20 mg via ORAL
  Filled 2014-07-22 (×2): qty 1

## 2014-07-22 MED ORDER — ASPIRIN EC 81 MG PO TBEC
81.0000 mg | DELAYED_RELEASE_TABLET | Freq: Every day | ORAL | Status: DC
  Filled 2014-07-22: qty 1

## 2014-07-22 MED ORDER — PREGABALIN 50 MG PO CAPS
150.0000 mg | ORAL_CAPSULE | Freq: Two times a day (BID) | ORAL | Status: DC
  Filled 2014-07-22: qty 3

## 2014-07-22 MED ORDER — DULOXETINE HCL 60 MG PO CPEP
120.0000 mg | ORAL_CAPSULE | Freq: Every day | ORAL | Status: DC
  Administered 2014-07-23 – 2014-07-24 (×2): 120 mg via ORAL
  Filled 2014-07-22 (×3): qty 2

## 2014-07-22 MED ORDER — LORAZEPAM 0.5 MG PO TABS: 0.5000 mg | ORAL_TABLET | Freq: Three times a day (TID) | ORAL | Status: DC | PRN

## 2014-07-22 MED ORDER — COQ-10 50 MG PO CAPS: 50.0000 mg | ORAL_CAPSULE | Freq: Every day | ORAL | Status: DC

## 2014-07-22 NOTE — Consult Note (Signed)
Reason for Consult:Head Injury Referring Physician: Zenia Resides  CC: Head Injury  HPI: Diane Richardson is an 63 y.o. female presenting with altered mental status.  Per family patient has been slowly declining over the past year.  Has had a precipitous decline over the past week.  Patient has horses.  She had a bad fall involving a horse about a year ago.  She had another about 3 weeks ago and her last one was a little over a week ago.  The family start ed noted her most significant decline after this fall.  She did not have any physical injuries or bruises from this injury.  She describes a head injury.  In fact she reports that the horse hit her in the head and then knocked her up in the air.  She has since had paranoid and bizarre behavior.    Past Medical History  Diagnosis Date  . Fibromyalgia   . PTSD (post-traumatic stress disorder)   . Anxiety   . Depression   . DVT (deep venous thrombosis)   . PE (pulmonary embolism)   . ADD (attention deficit disorder)   . Muscle spasm     bad  . Fatigue     chronic    Past Surgical History  Procedure Laterality Date  . Childbirth  1989    x1,NVD  . Pulmonary embolism surgery  1983    x9 days    Family History  Problem Relation Age of Onset  . CAD Father   . Hyperlipidemia Father   . Hypertension Father   . Hyperlipidemia Brother   . Diabetes Maternal Grandmother   . Cancer Maternal Grandmother     stomach  . Dementia Maternal Grandfather   . Colon polyps Maternal Grandfather   . CAD Paternal Grandmother   . Diabetes Paternal Grandmother   . Dementia Paternal Grandfather     Social History:  reports that she has never smoked. She has never used smokeless tobacco. She reports that she drinks alcohol. She reports that she does not use illicit drugs.  Allergies  Allergen Reactions  . Codeine Hives and Diarrhea  . Penicillins Hives and Diarrhea  . Percocet [Oxycodone-Acetaminophen] Other (See Comments)    5-368m,delusional  .  Percodan [Oxycodone-Aspirin] Other (See Comments)    delusional  . Donnatal [Belladonna Alk-Phenobarb Er] Rash    Medications: I have reviewed the patient's current medications. Prior to Admission:  Current outpatient prescriptions: ALPRAZolam (XANAX) 0.5 MG tablet, Take 0.5-1.5 mg by mouth 4 (four) times daily as needed for anxiety. 1 tab tid and 3 tab hs, Disp: , Rfl: ;  amphetamine-dextroamphetamine (ADDERALL) 10 MG tablet, Take 10 mg by mouth daily with breakfast. Total daily dose up to 325m Disp: , Rfl: ;  amphetamine-dextroamphetamine (ADDERALL) 20 MG tablet, Take 20 mg by mouth daily. Total daily dose up to 30 mg, Disp: , Rfl:  aspirin EC 81 MG tablet, Take 81 mg by mouth daily., Disp: , Rfl: ;  Cholecalciferol (VITAMIN D3) 3000 UNITS TABS, Take by mouth., Disp: , Rfl: ;  Coenzyme Q10 (COQ-10) 50 MG CAPS, Take by mouth daily. One capsule with a meal, Disp: , Rfl: ;  cyclobenzaprine (FLEXERIL) 10 MG tablet, Take 10 mg by mouth 3 (three) times daily as needed for muscle spasms., Disp: , Rfl:  DULoxetine (CYMBALTA) 30 MG capsule, Take 60-90 mg by mouth daily as needed (pain)., Disp: , Rfl: ;  ibuprofen (ADVIL,MOTRIN) 200 MG tablet, Take 400 mg by mouth every 6 (six) hours  as needed for headache or moderate pain., Disp: , Rfl: ;  metaxalone (SKELAXIN) 800 MG tablet, Take 800 mg by mouth 3 (three) times daily. As needed for muscle spasm, Disp: , Rfl:  Nutritional Supplements (JUICE PLUS FIBRE) LIQD, Take 8 oz by mouth daily., Disp: , Rfl: ;  pregabalin (LYRICA) 50 MG capsule, Take 50 mg by mouth 3 (three) times daily., Disp: , Rfl: ;  traMADol (ULTRAM) 50 MG tablet, Take 50-100 mg by mouth every 6 (six) hours as needed for moderate pain., Disp: , Rfl: ;  FLUoxetine (PROZAC) 20 MG tablet, Take 20 mg by mouth daily., Disp: , Rfl:   ROS: History obtained from the patient  General ROS: negative for - chills, fatigue, fever, night sweats, weight gain or weight loss Psychological ROS: negative for -  behavioral disorder, hallucinations, memory difficulties, mood swings or suicidal ideation Ophthalmic ROS: negative for - blurry vision, double vision, eye pain or loss of vision ENT ROS: negative for - epistaxis, nasal discharge, oral lesions, sore throat, tinnitus or vertigo Allergy and Immunology ROS: negative for - hives or itchy/watery eyes Hematological and Lymphatic ROS: negative for - bleeding problems, bruising or swollen lymph nodes Endocrine ROS: negative for - galactorrhea, hair pattern changes, polydipsia/polyuria or temperature intolerance Respiratory ROS: negative for - cough, hemoptysis, shortness of breath or wheezing Cardiovascular ROS: negative for - chest pain, dyspnea on exertion, edema or irregular heartbeat Gastrointestinal ROS: negative for - abdominal pain, diarrhea, hematemesis, nausea/vomiting or stool incontinence Genito-Urinary ROS: negative for - dysuria, hematuria, incontinence or urinary frequency/urgency Musculoskeletal ROS: negative for - joint swelling or muscular weakness Neurological ROS: as noted in HPI Dermatological ROS: negative for rash and skin lesion changes  Physical Examination: Blood pressure 126/85, pulse 74, temperature 97.9 F (36.6 C), temperature source Oral, resp. rate 16, SpO2 97 %.  Neurologic Examination Mental Status: Alert, oriented.  Thoughts are tangential and at times pressured.  Difficulty with concentration.  Speaks often of seeing verbs differently than other people.  Speech fluent without evidence of aphasia.  Able to follow 3 step commands but requires reinforcement. Cranial Nerves: II: Discs flat bilaterally; Visual fields grossly normal, pupils equal, round, reactive to light and accommodation III,IV, VI: ptosis not present, extra-ocular motions intact bilaterally V,VII: smile symmetric, facial light touch sensation normal bilaterally VIII: hearing normal bilaterally IX,X: gag reflex present XI: bilateral shoulder  shrug XII: midline tongue extension Motor: Right : Upper extremity   5/5    Left:     Upper extremity   5/5  Lower extremity   5/5     Lower extremity   5/5 Tone and bulk:normal tone throughout; no atrophy noted Sensory: Pinprick and light touch intact throughout, bilaterally Deep Tendon Reflexes: 2+ and symmetric with absent AJ's bilaterally Plantars: Right: downgoing   Left: downgoing Cerebellar: normal finger-to-nose and normal heel-to-shin test Gait: normal gait and station CV: pulses palpable throughout     Laboratory Studies:   Basic Metabolic Panel:  Recent Labs Lab 07/22/14 1131  NA 141  K 4.7  CL 102  CO2 27  GLUCOSE 102*  BUN 16  CREATININE 0.72  CALCIUM 10.1    Liver Function Tests:  Recent Labs Lab 07/22/14 1131  AST 18  ALT 13  ALKPHOS 66  BILITOT 0.4  PROT 7.5  ALBUMIN 4.5   No results for input(s): LIPASE, AMYLASE in the last 168 hours. No results for input(s): AMMONIA in the last 168 hours.  CBC:  Recent Labs Lab 07/22/14 1131  WBC 3.9*  NEUTROABS 2.4  HGB 13.4  HCT 39.8  MCV 90.5  PLT 231    Cardiac Enzymes: No results for input(s): CKTOTAL, CKMB, CKMBINDEX, TROPONINI in the last 168 hours.  BNP: Invalid input(s): POCBNP  CBG: No results for input(s): GLUCAP in the last 168 hours.  Microbiology: No results found for this or any previous visit.  Coagulation Studies: No results for input(s): LABPROT, INR in the last 72 hours.  Urinalysis:  Recent Labs Lab 07/22/14 1134  COLORURINE YELLOW  LABSPEC 1.013  PHURINE 5.5  GLUCOSEU NEGATIVE  HGBUR NEGATIVE  BILIRUBINUR NEGATIVE  KETONESUR NEGATIVE  PROTEINUR NEGATIVE  UROBILINOGEN 0.2  NITRITE NEGATIVE  LEUKOCYTESUR MODERATE*    Lipid Panel:  No results found for: CHOL, TRIG, HDL, CHOLHDL, VLDL, LDLCALC  HgbA1C: No results found for: HGBA1C  Urine Drug Screen:     Component Value Date/Time   LABOPIA NONE DETECTED 07/22/2014 1134   COCAINSCRNUR NONE DETECTED  07/22/2014 1134   LABBENZ POSITIVE* 07/22/2014 1134   AMPHETMU POSITIVE* 07/22/2014 1134   THCU NONE DETECTED 07/22/2014 1134   LABBARB NONE DETECTED 07/22/2014 1134    Alcohol Level:  Recent Labs Lab 07/22/14 1131  ETH <11    Other results: EKG: normal sinus rhythm at 67 bpm.  Imaging: Ct Head Wo Contrast  07/22/2014   CLINICAL DATA:  Fall, head injury 2 weeks ago, recent change in behavior  EXAM: CT HEAD WITHOUT CONTRAST  TECHNIQUE: Contiguous axial images were obtained from the base of the skull through the vertex without intravenous contrast.  COMPARISON:  01/23/2013  FINDINGS: No skull fracture is noted. Paranasal sinuses and mastoid air cells are unremarkable.  No intracranial hemorrhage, mass effect or midline shift. No acute cortical infarction. Stable cerebral atrophy. Stable periventricular mild chronic white matter disease. No mass lesion is noted on this unenhanced scan.  IMPRESSION: No acute intracranial abnormality. Stable mild cerebral atrophy. Stable mild periventricular chronic white matter disease.   Electronically Signed   By: Lahoma Crocker M.D.   On: 07/22/2014 11:34     Assessment/Plan: 63 year old female presenting with paranoia and bizarre behavior that has worsened since a head injury about a week ago.  There was no reported loss of consciousness with the injury and the family noted o bruising or injury otherwise.  Head CT reviewed and shows no acute changes.   Although patient may have had a mild concussion that may have may lead to difficulty with memory and concentration, would not expect this to be the cause of her other complaints of paranoia and bizarre behavior.  Patient with an otherwise normal neurological examination.  Imaging unremarkable as well.   No neurological intervention recommended at this time.  Patient cleared neurologically for psychiatric treatment.  Alexis Goodell, MD [facility] [phone] 07/22/2014, 6:29 PM

## 2014-07-22 NOTE — ED Notes (Signed)
Pt wanded by security. Pt to go to bed 40.

## 2014-07-22 NOTE — ED Notes (Signed)
Patient denies SI, HI. Reports VH. States "I see scary words". Speech disorganized. Reports being "unsure" if she is depressed, then states "I'm feeling better because I was at a point that I didn't really care". Patient appears confused, pleasant; cooperative.  Encouragement offered. Given beverage and toiletries.   Q 15 safety checks in place.

## 2014-07-22 NOTE — ED Notes (Signed)
Pt also concerned over a fall a couple of weeks ago. Hit her head but did not come in for evaluation. Pt has spent several minutes rambling in conversation. Family remains with pt.
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
## 2014-07-22 NOTE — ED Notes (Signed)
Spoke with pharmacist, pharmacist states that we should hold off on giving meds at this point as they have not yet been verified. Pt's daughter is going to get medication bottles that pt is taking and will review them with pharmacy tech, at which time med orders will be modified.
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
## 2014-07-22 NOTE — ED Notes (Addendum)
"  I cannot get into my phone, I cannot remember my passwords." "I was trying to concentrate on words and pictures on my computer screen, I noticed that when my husband come close to me I got anxious and he gets anxious." "I am not able to concentrate, I see parts of words or letters or parts of pictures, they make sense to me, I don't know. " "It doesn't happen when its not there." "When he comes home and is almost tense and yells at me because I cannot use the frying pan." "Did you see the cat has thrown up? I haven't been upstairs to see the cat has thrown up." Continues to ramble in conversation. Symptoms started "about time my mother died," On and off for 26 years.

## 2014-07-22 NOTE — ED Notes (Addendum)
Patient reports increasing anxiety. Paranoia. Worried about who she can trust and if certain thoughts and issues from her past were real or imagined.   Encouragement offered. Educated patient on medications. Given Keflex and Ativan.   Q 15 checks continue.

## 2014-07-22 NOTE — ED Notes (Signed)
MD at bedside. 

## 2014-07-22 NOTE — Progress Notes (Signed)
PHARMACIST - PHYSICIAN ORDER COMMUNICATION  CONCERNING: P&T Medication Policy on Herbal Medications  DESCRIPTION:  This patient's order for:  Co Q  has been noted.  This product(s) is classified as an "herbal" or natural product. Due to a lack of definitive safety studies or FDA approval, nonstandard manufacturing practices, plus the potential risk of unknown drug-drug interactions while on inpatient medications, the Pharmacy and Therapeutics Committee does not permit the use of "herbal" or natural products of this type within [place].   [facility]: The pharmacy department is unable to verify this order at this time and your patient has been informed of this safety policy. Please reevaluate patient's clinical condition at discharge and address if the herbal or natural product(s) should be resumed at that time.     Thanks! Lorenza EvangelistGreen, Kersti Scavone R 07/22/2014 11:37 AM

## 2014-07-23 ENCOUNTER — Emergency Department (HOSPITAL_COMMUNITY): Payer: BC Managed Care – PPO

## 2014-07-23 DIAGNOSIS — F29 Unspecified psychosis not due to a substance or known physiological condition: Secondary | ICD-10-CM

## 2014-07-23 DIAGNOSIS — F22 Delusional disorders: Secondary | ICD-10-CM | POA: Diagnosis not present

## 2014-07-23 LAB — URINE CULTURE
Colony Count: NO GROWTH
Culture: NO GROWTH
Special Requests: NORMAL

## 2014-07-23 MED ORDER — ARIPIPRAZOLE 5 MG PO TABS
5.0000 mg | ORAL_TABLET | Freq: Every day | ORAL | Status: DC
  Administered 2014-07-23 – 2014-07-24 (×2): 5 mg via ORAL
  Filled 2014-07-23 (×2): qty 1

## 2014-07-23 NOTE — BH Assessment (Signed)
Annie from [facility] called with questions about PT's behavior and was informed no violence, CIRT, or 1 to1 was noted [facility].  Pattricia Bossnnie reports that as soon as the [employer]PT's [employer] can be verified via Internet portal, she will be placed on wait list.

## 2014-07-23 NOTE — BH Assessment (Addendum)
[facility] Assessment Progress Note   The following gero facilities were called by this clinician in an attempt to place the pt:  Faxed for Review [facility]- can send referral for possible wait list per Diane Davis - beds per Amour Trigg [facility] - beds per [facility] (pt under review there) [place] - discharges expected tomorrow per Angela   [facility] per Kayla [place] per Angela [facility] per [facility] [place] per [place]   No Answer [facility] - left message @ [address] @ 1259 - left message  TTS will continue to seek placement for the pt. There are no beds at [facility] currently.  Diane Dawnell Bryant, MS, LPC Licensed Professional Counselor Therapeutic Triage Specialist [facility] Phone: [phone] Fax: [phone]     

## 2014-07-23 NOTE — Consult Note (Signed)
[facility] Face-to-Face Psychiatry Consult   Reason for Consult:  Paranoia Referring Physician: ED Physician Diane Richardson is an 63 y.o. female. Total Time spent with patient: 35 minutes  Assessment: AXIS I:  Psychosis NOS AXIS II:  Deferred  AXIS III:   Past Medical History  Diagnosis Date  . Fibromyalgia   . PTSD (post-traumatic stress disorder)   . Anxiety   . Depression   . DVT (deep venous thrombosis)   . PE (pulmonary embolism)   . ADD (attention deficit disorder)   . Muscle spasm     bad  . Fatigue     chronic   AXIS IV:  Marital /family strain AXIS V:  31-40 impairment in reality testing  Plan:  No evidence of imminent risk to self or others at present.   Recommend psychiatric Inpatient admission when medically cleared.  Subjective:   Diane Richardson is a 63 y.o. female patient admitted with paranoid ideations, as described by family member.Marland Kitchen  HPI:  Patient is a 63 year old female. She is seen with her husband, who convinced her to come to ED. He provides collateral information. Patient is a fair historian , over-inclusive in her thought process, and has difficulty describing what is occuring. As per information obtained from patient and husband over the past several months patient has become more and more focused ( husband uses term " obsessed") with internet  Sites dealing with horse mistreatment and slaughter. She has become increasingly preoccupied that there is some significant cover up and that experiments are being conducted on horses that will then be used to harm humans. She makes statements such as " you know horses and humans share more than 60 % of their DNA", and  " they use a lot of the same substances on horses and on people". Patient  Has told her husband that she feels they are in danger because of her internet research, although patient herself is vague about this, stating " I really do not know how much I should tell you". Attempts from husband to challenge  her thoughts, reality testing, have resulted in increased marital discord. Patient states she has been feeling depressed, sad, as a result of the above, but does not endorse severe depression and psychotic symptoms do not seem to be associated to a major depressive or manic mood episode at this time. 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 She states she has been diagnosed with ADHD in the past . She denies any history of suicidal attempts and denies any history of hallucinations.There is no known prior history of psychosis. Patient has a history of ADHD(?) an is on Adderall. She is also on Ativan " at night" and Cymbalta Patient denies abusing adderall or any drug or alcohol abuse. * Of note, patient fell off a horse a few weeks ago- she did get a Head CT scan on 11/14- no acute abnormalities   HPI Elements:  Gradually worsening psychotic symptoms  Past Psychiatric History:- see above ( highlighted)  Past Medical History  Diagnosis Date  . Fibromyalgia   . PTSD (post-traumatic stress disorder)   . Anxiety   . Depression   . DVT (deep venous thrombosis)   . PE (pulmonary embolism)   . ADD (attention deficit disorder)   . Muscle spasm     bad  . Fatigue     chronic  reports that she has never smoked. She has never used smokeless tobacco. She reports that she drinks alcohol. She reports that she does not use illicit drugs. Family History  Problem Relation Age of Onset  . CAD Father   . Hyperlipidemia Father   . Hypertension Father   . Hyperlipidemia Brother   . Diabetes Maternal Grandmother   . Cancer Maternal Grandmother     stomach  . Dementia Maternal Grandfather   . Colon polyps Maternal Grandfather   . CAD Paternal Grandmother   . Diabetes Paternal Grandmother   . Dementia Paternal Grandfather    Family History Substance Abuse: No Family  Supports: Yes, List: Living Arrangements: Spouse/significant other Can pt return to current living arrangement?: Yes   Allergies:   Allergies  Allergen Reactions  . Codeine Hives and Diarrhea  . Penicillins Hives and Diarrhea  . Percocet [Oxycodone-Acetaminophen] Other (See Comments)    5-319m,delusional  . Percodan [Oxycodone-Aspirin] Other (See Comments)    delusional  . Donnatal [Belladonna Alk-Phenobarb Er] Rash     Objective: Blood pressure 157/68, pulse 93, temperature 98.9 F (37.2 C), temperature source Oral, resp. rate 18, SpO2 98 %.There is no weight on file to calculate BMI. Results for orders placed or performed during the hospital encounter of 07/22/14 (from the past 72 hour(s))  CBC with Differential     Status: Abnormal   Collection Time: 07/22/14 11:31 AM  Result Value Ref Range   WBC 3.9 (L) 4.0 - 10.5 K/uL   RBC 4.40 3.87 - 5.11 MIL/uL   Hemoglobin 13.4 12.0 - 15.0 g/dL   HCT 39.8 36.0 - 46.0 %   MCV 90.5 78.0 - 100.0 fL   MCH 30.5 26.0 - 34.0 pg   MCHC 33.7 30.0 - 36.0 g/dL   RDW 12.1 11.5 - 15.5 %   Platelets 231 150 - 400 K/uL   Neutrophils Relative % 62 43 - 77 %   Neutro Abs 2.4 1.7 - 7.7 K/uL   Lymphocytes Relative 24 12 - 46 %   Lymphs Abs 0.9 0.7 - 4.0 K/uL   Monocytes Relative 11 3 - 12 %   Monocytes Absolute 0.4 0.1 - 1.0 K/uL   Eosinophils Relative 2 0 - 5 %   Eosinophils Absolute 0.1 0.0 - 0.7 K/uL   Basophils Relative 1 0 - 1 %   Basophils Absolute 0.0 0.0 - 0.1 K/uL  Comprehensive metabolic panel     Status: Abnormal   Collection Time: 07/22/14 11:31 AM  Result Value Ref Range   Sodium 141 137 - 147 mEq/L   Potassium 4.7 3.7 - 5.3 mEq/L   Chloride 102 96 - 112 mEq/L   CO2 27 19 - 32 mEq/L   Glucose, Bld 102 (H) 70 - 99 mg/dL   BUN 16 6 - 23 mg/dL   Creatinine, Ser 0.72 0.50 - 1.10 mg/dL   Calcium 10.1 8.4 - 10.5 mg/dL   Total Protein 7.5 6.0 - 8.3 g/dL   Albumin 4.5 3.5 - 5.2 g/dL   AST 18 0 - 37 U/L   ALT 13 0 - 35 U/L    Alkaline Phosphatase 66 39 - 117 U/L   Total Bilirubin 0.4 0.3 - 1.2 mg/dL   GFR calc non Af Amer >90 >90 mL/min   GFR calc Af Amer >90 >90 mL/min    Comment: (NOTE) The eGFR has been calculated using the CKD EPI equation. This calculation has not been validated in all clinical situations. eGFR's persistently <90 mL/min  signify possible Chronic Kidney Disease.    Anion gap 12 5 - 15  Salicylate level     Status: Abnormal   Collection Time: 07/22/14 11:31 AM  Result Value Ref Range   Salicylate Lvl <3.8 (L) 2.8 - 20.0 mg/dL  Acetaminophen level     Status: None   Collection Time: 07/22/14 11:31 AM  Result Value Ref Range   Acetaminophen (Tylenol), Serum <15.0 10 - 30 ug/mL    Comment:        THERAPEUTIC CONCENTRATIONS VARY SIGNIFICANTLY. A RANGE OF 10-30 ug/mL MAY BE AN EFFECTIVE CONCENTRATION FOR MANY PATIENTS. HOWEVER, SOME ARE BEST TREATED AT CONCENTRATIONS OUTSIDE THIS RANGE. ACETAMINOPHEN CONCENTRATIONS >150 ug/mL AT 4 HOURS AFTER INGESTION AND >50 ug/mL AT 12 HOURS AFTER INGESTION ARE OFTEN ASSOCIATED WITH TOXIC REACTIONS.   Ethanol     Status: None   Collection Time: 07/22/14 11:31 AM  Result Value Ref Range   Alcohol, Ethyl (B) <11 0 - 11 mg/dL    Comment:        LOWEST DETECTABLE LIMIT FOR SERUM ALCOHOL IS 11 mg/dL FOR MEDICAL PURPOSES ONLY   Urinalysis, Routine w reflex microscopic     Status: Abnormal   Collection Time: 07/22/14 11:34 AM  Result Value Ref Range   Color, Urine YELLOW YELLOW   APPearance CLEAR CLEAR   Specific Gravity, Urine 1.013 1.005 - 1.030   pH 5.5 5.0 - 8.0   Glucose, UA NEGATIVE NEGATIVE mg/dL   Hgb urine dipstick NEGATIVE NEGATIVE   Bilirubin Urine NEGATIVE NEGATIVE   Ketones, ur NEGATIVE NEGATIVE mg/dL   Protein, ur NEGATIVE NEGATIVE mg/dL   Urobilinogen, UA 0.2 0.0 - 1.0 mg/dL   Nitrite NEGATIVE NEGATIVE   Leukocytes, UA MODERATE (A) NEGATIVE  Drug screen panel, emergency     Status: Abnormal   Collection Time: 07/22/14  11:34 AM  Result Value Ref Range   Opiates NONE DETECTED NONE DETECTED   Cocaine NONE DETECTED NONE DETECTED   Benzodiazepines POSITIVE (A) NONE DETECTED   Amphetamines POSITIVE (A) NONE DETECTED   Tetrahydrocannabinol NONE DETECTED NONE DETECTED   Barbiturates NONE DETECTED NONE DETECTED    Comment:        DRUG SCREEN FOR MEDICAL PURPOSES ONLY.  IF CONFIRMATION IS NEEDED FOR ANY PURPOSE, NOTIFY LAB WITHIN 5 DAYS.        LOWEST DETECTABLE LIMITS FOR URINE DRUG SCREEN Drug Class       Cutoff (ng/mL) Amphetamine      1000 Barbiturate      200 Benzodiazepine   466 Tricyclics       599 Opiates          300 Cocaine          300 THC              50   Urine microscopic-add on     Status: None   Collection Time: 07/22/14 11:34 AM  Result Value Ref Range   Squamous Epithelial / LPF RARE RARE   WBC, UA 21-50 <3 WBC/hpf   Urine-Other MUCOUS PRESENT    Labs are reviewed and are pertinent for  (+) amphetamines and BZDs on UDS   Current Facility-Administered Medications  Medication Dose Route Frequency Provider Last Rate Last Dose  . amphetamine-dextroamphetamine (ADDERALL) tablet 10 mg  10 mg Oral Q breakfast Courtney A Forcucci, PA-C   10 mg at 07/23/14 3570  . amphetamine-dextroamphetamine (ADDERALL) tablet 20 mg  20 mg Oral Daily Courtney A Forcucci, PA-C   20  mg at 07/23/14 1005  . cephALEXin (KEFLEX) capsule 500 mg  500 mg Oral 3 times per day Jamie Kato Forcucci, PA-C   500 mg at 07/23/14 0548  . DULoxetine (CYMBALTA) DR capsule 120 mg  120 mg Oral Daily Courtney A Forcucci, PA-C   120 mg at 07/23/14 1006  . LORazepam (ATIVAN) tablet 0.5 mg  0.5 mg Oral Q8H PRN Courtney A Forcucci, PA-C      . LORazepam (ATIVAN) tablet 1 mg  1 mg Oral Q8H PRN Courtney A Forcucci, PA-C   1 mg at 07/22/14 2121  . metaxalone (SKELAXIN) tablet 800 mg  800 mg Oral TID PRN Courtney A Forcucci, PA-C      . zolpidem (AMBIEN) tablet 5 mg  5 mg Oral QHS PRN Cherylann Parr, PA-C       Current  Outpatient Prescriptions  Medication Sig Dispense Refill  . ALPRAZolam (XANAX) 0.5 MG tablet Take 0.5-1.5 mg by mouth 4 (four) times daily as needed for anxiety. 1 tab tid and 3 tab hs    . amphetamine-dextroamphetamine (ADDERALL) 10 MG tablet Take 10 mg by mouth daily with breakfast. Total daily dose up to 4m    . amphetamine-dextroamphetamine (ADDERALL) 20 MG tablet Take 20 mg by mouth daily. Total daily dose up to 30 mg    . aspirin EC 81 MG tablet Take 81 mg by mouth daily.    . Cholecalciferol (VITAMIN D3) 3000 UNITS TABS Take by mouth.    . Coenzyme Q10 (COQ-10) 50 MG CAPS Take by mouth daily. One capsule with a meal    . cyclobenzaprine (FLEXERIL) 10 MG tablet Take 10 mg by mouth 3 (three) times daily as needed for muscle spasms.    . DULoxetine (CYMBALTA) 30 MG capsule Take 60-90 mg by mouth daily as needed (pain).    .Marland Kitchenibuprofen (ADVIL,MOTRIN) 200 MG tablet Take 400 mg by mouth every 6 (six) hours as needed for headache or moderate pain.    . metaxalone (SKELAXIN) 800 MG tablet Take 800 mg by mouth 3 (three) times daily. As needed for muscle spasm    . Nutritional Supplements (JUICE PLUS FIBRE) LIQD Take 8 oz by mouth daily.    . pregabalin (LYRICA) 50 MG capsule Take 50 mg by mouth 3 (three) times daily.    . traMADol (ULTRAM) 50 MG tablet Take 50-100 mg by mouth every 6 (six) hours as needed for moderate pain.    .Marland KitchenFLUoxetine (PROZAC) 20 MG tablet Take 20 mg by mouth daily.      Psychiatric Specialty Exam:     Blood pressure 157/68, pulse 93, temperature 98.9 F (37.2 C), temperature source Oral, resp. rate 18, SpO2 98 %.There is no weight on file to calculate BMI.  General Appearance: Fairly Groomed  EEngineer, water:  Good  Speech:  Normal Rate  Volume:  Normal  Mood:  Anxious  Affect:  appropriate, slightly anxious  Thought Process:  Irrelevant  Orientation:  Other:  fully alert and attentive  Thought Content:  Paranoid Ideation, Rumination and denies hallucinations   Suicidal Thoughts:  No At this time denies any suicidal plan or intent and contracts for safety   Homicidal Thoughts:  No  Memory:  Recent and Remote grossly intact  Judgement:  Fair  Insight:  Lacking  Psychomotor Activity:  Normal  Concentration:  Good  Recall:  Good  Fund of Knowledge:Good  Language: Good  Akathisia:  Negative  Handed:  Right  AIMS (if indicated):  Assets:  Communication Skills Desire for Improvement Resilience Social Support  Sleep:      Musculoskeletal: Strength & Muscle Tone: within normal limits Gait & Station: normal Patient leans: N/A  Treatment Plan Summary: 1. At this time patient warrants inpatient psychiatric admission. There are no grounds for involuntary commitment , but she is currently agreeing, and does state " I know something is not right and I need help".  Patient is willing to sign in to hospital on voluntary basis and husband is supportive of this. 2. Will D/C Adderall due to concern it may be contributing to or exacerbating psychotic symptoms 3. Will  Start Abilify 5 mgrs QHS   COBOS, FERNANDO 07/23/2014 1:38 PM

## 2014-07-23 NOTE — ED Notes (Signed)
Pt up and ambulatory about floor, AAO x 3, will continue to monitor for safety.

## 2014-07-23 NOTE — ED Notes (Addendum)
Per Marcelino DusterMichelle, TTS, patient needs chest xray for placement. Dr. Mora Bellmanni made aware.

## 2014-07-24 ENCOUNTER — Encounter (HOSPITAL_COMMUNITY): Payer: Self-pay | Admitting: Psychiatry

## 2014-07-24 DIAGNOSIS — F29 Unspecified psychosis not due to a substance or known physiological condition: Secondary | ICD-10-CM | POA: Insufficient documentation

## 2014-07-24 DIAGNOSIS — F22 Delusional disorders: Secondary | ICD-10-CM | POA: Diagnosis not present

## 2014-07-24 MED ORDER — CEPHALEXIN 500 MG PO CAPS: 500.0000 mg | ORAL_CAPSULE | Freq: Three times a day (TID) | ORAL | Status: DC

## 2014-07-24 MED ORDER — COQ-10 50 MG PO CAPS: 1.0000 | ORAL_CAPSULE | Freq: Every day | ORAL | Status: DC

## 2014-07-24 MED ORDER — LORAZEPAM 1 MG PO TABS: 1.0000 mg | ORAL_TABLET | Freq: Three times a day (TID) | ORAL | Status: DC | PRN

## 2014-07-24 MED ORDER — ARIPIPRAZOLE 5 MG PO TABS: 5.0000 mg | ORAL_TABLET | Freq: Every day | ORAL | Status: DC

## 2014-07-24 MED ORDER — PREGABALIN 50 MG PO CAPS: 50.0000 mg | ORAL_CAPSULE | Freq: Three times a day (TID) | ORAL | Status: DC

## 2014-07-24 MED ORDER — DULOXETINE HCL 60 MG PO CPEP: 120.0000 mg | ORAL_CAPSULE | Freq: Every day | ORAL | Status: DC

## 2014-07-24 MED ORDER — VITAMIN D3 75 MCG (3000 UT) PO TABS: 3000.0000 [IU] | ORAL_TABLET | Freq: Every day | ORAL | Status: DC

## 2014-07-24 NOTE — [facility] Counselor (Signed)
Pt signed voluntary consent for treatment for [place]. Writer faxed signed consent to Nich[facility]athleen at [facility].   Evette Cristalaroline Paige Liandra Mendia, [place] Assessment Counselor

## 2014-07-24 NOTE — Consult Note (Signed)
[facility] Face-to-Face Psychiatry Consult   Reason for Consult:  Paranoia  Referring Physician: ED Physician Diane Richardson is an 63 y.o. female. Total Time spent with patient: 35 minutes  Assessment: AXIS I:  Psychosis NOS AXIS II:  Deferred  AXIS III:   Past Medical History  Diagnosis Date  . Fibromyalgia   . PTSD (post-traumatic stress disorder)   . Anxiety   . Depression   . DVT (deep venous thrombosis)   . PE (pulmonary embolism)   . ADD (attention deficit disorder)   . Muscle spasm     bad  . Fatigue     chronic   AXIS IV:  Marital /family strain AXIS V:  31-40 impairment in reality testing  Plan:  No evidence of imminent risk to self or others at present.   Recommend psychiatric Inpatient admission when medically cleared.  Subjective:   Diane Richardson is a 63 y.o. female patient admitted with paranoid ideations, as described by family member.Marland Kitchen  HPI: Pr. Note Dr. Sindy Messing dated 07/23/2014  Patient is a 63 year old female. She is seen with her husband, who convinced her to come to ED. He provides collateral information. Patient is a fair historian , over-inclusive in her thought process, and has difficulty describing what is occuring. As per information obtained from patient and husband over the past several months patient has become more and more focused ( husband uses term " obsessed") with internet  Sites dealing with horse mistreatment and slaughter. She has become increasingly preoccupied that there is some significant cover up and that experiments are being conducted on horses that will then be used to harm humans. She makes statements such as " you know horses and humans share more than 60 % of their DNA", and  " they use a lot of the same substances on horses and on people". Patient  Has told her husband that she feels they are in danger because of her internet research, although patient herself is vague about this, stating " I really do not know how much I should tell  you". Attempts from husband to challenge her thoughts, reality testing, have resulted in increased marital discord. Patient states she has been feeling depressed, sad, as a result of the above, but does not endorse severe depression and psychotic symptoms do not seem to be associated to a major depressive or manic mood episode at this time. 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 She states she has been diagnosed with ADHD in the past . She denies any history of suicidal attempts and denies any history of hallucinations.There is no known prior history of psychosis. Patient has a history of ADHD(?) an is on Adderall. She is also on Ativan " at night" and Cymbalta Patient denies abusing adderall or any drug or alcohol abuse. * Of note, patient fell off a horse a few weeks ago- she did get a Head CT scan on 11/14- no acute abnormalities  Today:  Patient remains confused states "my mind has too much information"  Patient reports that she has been taking Adderall to help her remain alert and to stay awake during the day.  Today  Reports that she sometimes takes more than prescribed as she still feels tired although previously denies this.  Patient remains paranoid and distrustful.  No evidence of attending to internal stimuli and denies auditory or visual  hallucinations.  UDS was positive for amphetamines and benzodiazepines. Denies current suicidal ideation. No homicidal ideation Thought remain tangential and paranoid.     HPI Elements:  Gradually worsening psychotic symptoms  Past Psychiatric History:- see above ( highlighted)  Past Medical History  Diagnosis Date  . Fibromyalgia   . PTSD (post-traumatic stress disorder)   . Anxiety   . Depression   . DVT (deep venous thrombosis)   . PE (pulmonary embolism)   . ADD (attention deficit disorder)   . Muscle spasm      bad  . Fatigue     chronic    reports that she has never smoked. She has never used smokeless tobacco. She reports that she drinks alcohol. She reports that she does not use illicit drugs. Family History  Problem Relation Age of Onset  . CAD Father   . Hyperlipidemia Father   . Hypertension Father   . Hyperlipidemia Brother   . Diabetes Maternal Grandmother   . Cancer Maternal Grandmother     stomach  . Dementia Maternal Grandfather   . Colon polyps Maternal Grandfather   . CAD Paternal Grandmother   . Diabetes Paternal Grandmother   . Dementia Paternal Grandfather    Family History Substance Abuse: No Family Supports: Yes, List: Living Arrangements: Spouse/significant other Can pt return to current living arrangement?: Yes   Allergies:   Allergies  Allergen Reactions  . Codeine Hives and Diarrhea  . Penicillins Hives and Diarrhea  . Percocet [Oxycodone-Acetaminophen] Other (See Comments)    5-385m,delusional  . Percodan [Oxycodone-Aspirin] Other (See Comments)    delusional  . Donnatal [Belladonna Alk-Phenobarb Er] Rash     Objective: Blood pressure 127/65, pulse 82, temperature 98.1 F (36.7 C), temperature source Oral, resp. rate 16, SpO2 97 %.There is no weight on file to calculate BMI. Results for orders placed or performed during the hospital encounter of 07/22/14 (from the past 72 hour(s))  CBC with Differential     Status: Abnormal   Collection Time: 07/22/14 11:31 AM  Result Value Ref Range   WBC 3.9 (L) 4.0 - 10.5 K/uL   RBC 4.40 3.87 - 5.11 MIL/uL   Hemoglobin 13.4 12.0 - 15.0 g/dL   HCT 39.8 36.0 - 46.0 %   MCV 90.5 78.0 - 100.0 fL   MCH 30.5 26.0 - 34.0 pg   MCHC 33.7 30.0 - 36.0 g/dL   RDW 12.1 11.5 - 15.5 %   Platelets 231 150 - 400 K/uL   Neutrophils Relative % 62 43 - 77 %   Neutro Abs 2.4 1.7 - 7.7 K/uL   Lymphocytes Relative 24 12 - 46 %   Lymphs Abs 0.9 0.7 - 4.0 K/uL   Monocytes Relative 11 3 - 12 %   Monocytes Absolute 0.4 0.1 - 1.0  K/uL   Eosinophils Relative 2 0 - 5 %   Eosinophils Absolute 0.1 0.0 - 0.7 K/uL   Basophils Relative 1 0 - 1 %   Basophils Absolute 0.0 0.0 - 0.1 K/uL  Comprehensive metabolic panel     Status: Abnormal   Collection Time: 07/22/14 11:31 AM  Result Value Ref Range   Sodium 141 137 - 147 mEq/L   Potassium 4.7 3.7 - 5.3 mEq/L   Chloride 102 96 - 112 mEq/L   CO2 27 19 - 32 mEq/L   Glucose, Bld 102 (H) 70 - 99 mg/dL   BUN 16 6 - 23 mg/dL   Creatinine, Ser 0.72 0.50 - 1.10 mg/dL  Calcium 10.1 8.4 - 10.5 mg/dL   Total Protein 7.5 6.0 - 8.3 g/dL   Albumin 4.5 3.5 - 5.2 g/dL   AST 18 0 - 37 U/L   ALT 13 0 - 35 U/L   Alkaline Phosphatase 66 39 - 117 U/L   Total Bilirubin 0.4 0.3 - 1.2 mg/dL   GFR calc non Af Amer >90 >90 mL/min   GFR calc Af Amer >90 >90 mL/min    Comment: (NOTE) The eGFR has been calculated using the CKD EPI equation. This calculation has not been validated in all clinical situations. eGFR's persistently <90 mL/min signify possible Chronic Kidney Disease.    Anion gap 12 5 - 15  Salicylate level     Status: Abnormal   Collection Time: 07/22/14 11:31 AM  Result Value Ref Range   Salicylate Lvl <7.5 (L) 2.8 - 20.0 mg/dL  Acetaminophen level     Status: None   Collection Time: 07/22/14 11:31 AM  Result Value Ref Range   Acetaminophen (Tylenol), Serum <15.0 10 - 30 ug/mL    Comment:        THERAPEUTIC CONCENTRATIONS VARY SIGNIFICANTLY. A RANGE OF 10-30 ug/mL MAY BE AN EFFECTIVE CONCENTRATION FOR MANY PATIENTS. HOWEVER, SOME ARE BEST TREATED AT CONCENTRATIONS OUTSIDE THIS RANGE. ACETAMINOPHEN CONCENTRATIONS >150 ug/mL AT 4 HOURS AFTER INGESTION AND >50 ug/mL AT 12 HOURS AFTER INGESTION ARE OFTEN ASSOCIATED WITH TOXIC REACTIONS.   Ethanol     Status: None   Collection Time: 07/22/14 11:31 AM  Result Value Ref Range   Alcohol, Ethyl (B) <11 0 - 11 mg/dL    Comment:        LOWEST DETECTABLE LIMIT FOR SERUM ALCOHOL IS 11 mg/dL FOR MEDICAL PURPOSES ONLY    Urinalysis, Routine w reflex microscopic     Status: Abnormal   Collection Time: 07/22/14 11:34 AM  Result Value Ref Range   Color, Urine YELLOW YELLOW   APPearance CLEAR CLEAR   Specific Gravity, Urine 1.013 1.005 - 1.030   pH 5.5 5.0 - 8.0   Glucose, UA NEGATIVE NEGATIVE mg/dL   Hgb urine dipstick NEGATIVE NEGATIVE   Bilirubin Urine NEGATIVE NEGATIVE   Ketones, ur NEGATIVE NEGATIVE mg/dL   Protein, ur NEGATIVE NEGATIVE mg/dL   Urobilinogen, UA 0.2 0.0 - 1.0 mg/dL   Nitrite NEGATIVE NEGATIVE   Leukocytes, UA MODERATE (A) NEGATIVE  Drug screen panel, emergency     Status: Abnormal   Collection Time: 07/22/14 11:34 AM  Result Value Ref Range   Opiates NONE DETECTED NONE DETECTED   Cocaine NONE DETECTED NONE DETECTED   Benzodiazepines POSITIVE (A) NONE DETECTED   Amphetamines POSITIVE (A) NONE DETECTED   Tetrahydrocannabinol NONE DETECTED NONE DETECTED   Barbiturates NONE DETECTED NONE DETECTED    Comment:        DRUG SCREEN FOR MEDICAL PURPOSES ONLY.  IF CONFIRMATION IS NEEDED FOR ANY PURPOSE, NOTIFY LAB WITHIN 5 DAYS.        LOWEST DETECTABLE LIMITS FOR URINE DRUG SCREEN Drug Class       Cutoff (ng/mL) Amphetamine      1000 Barbiturate      200 Benzodiazepine   170 Tricyclics       017 Opiates          300 Cocaine          300 THC              50   Urine microscopic-add on     Status: None  Collection Time: 07/22/14 11:34 AM  Result Value Ref Range   Squamous Epithelial / LPF RARE RARE   WBC, UA 21-50 <3 WBC/hpf   Urine-Other MUCOUS PRESENT   Urine culture     Status: None   Collection Time: 07/22/14  1:05 PM  Result Value Ref Range   Specimen Description URINE, CLEAN CATCH    Special Requests Normal    Culture  Setup Time      07/22/2014 20:47 Performed at [place] Performed at [facility]     Culture NO GROWTH Performed at [facility]     Report Status 07/23/2014 FINAL    Labs are reviewed and  are pertinent for  (+) amphetamines and BZDs on UDS   Current Facility-Administered Medications  Medication Dose Route Frequency Provider Last Rate Last Dose  . ARIPiprazole (ABILIFY) tablet 5 mg  5 mg Oral Daily Myer Peer Cobos, MD   5 mg at 07/24/14 1046  . cephALEXin (KEFLEX) capsule 500 mg  500 mg Oral 3 times per day Jamie Kato Forcucci, PA-C   500 mg at 07/24/14 0554  . DULoxetine (CYMBALTA) DR capsule 120 mg  120 mg Oral Daily Courtney A Forcucci, PA-C   120 mg at 07/24/14 1046  . LORazepam (ATIVAN) tablet 1 mg  1 mg Oral Q8H PRN Courtney A Forcucci, PA-C   1 mg at 07/23/14 2135  . metaxalone (SKELAXIN) tablet 800 mg  800 mg Oral TID PRN Cherylann Parr, PA-C       Current Outpatient Prescriptions  Medication Sig Dispense Refill  . ALPRAZolam (XANAX) 0.5 MG tablet Take 0.5-1.5 mg by mouth 4 (four) times daily as needed for anxiety. 1 tab tid and 3 tab hs    . amphetamine-dextroamphetamine (ADDERALL) 10 MG tablet Take 10 mg by mouth daily with breakfast. Total daily dose up to 85m    . amphetamine-dextroamphetamine (ADDERALL) 20 MG tablet Take 20 mg by mouth daily. Total daily dose up to 30 mg    . aspirin EC 81 MG tablet Take 81 mg by mouth daily.    . Cholecalciferol (VITAMIN D3) 3000 UNITS TABS Take by mouth.    . Coenzyme Q10 (COQ-10) 50 MG CAPS Take by mouth daily. One capsule with a meal    . cyclobenzaprine (FLEXERIL) 10 MG tablet Take 10 mg by mouth 3 (three) times daily as needed for muscle spasms.    . DULoxetine (CYMBALTA) 30 MG capsule Take 60-90 mg by mouth daily as needed (pain).    .Marland Kitchenibuprofen (ADVIL,MOTRIN) 200 MG tablet Take 400 mg by mouth every 6 (six) hours as needed for headache or moderate pain.    . metaxalone (SKELAXIN) 800 MG tablet Take 800 mg by mouth 3 (three) times daily. As needed for muscle spasm    . Nutritional Supplements (JUICE PLUS FIBRE) LIQD Take 8 oz by mouth daily.    . pregabalin (LYRICA) 50 MG capsule Take 50 mg by mouth 3 (three) times  daily.    . traMADol (ULTRAM) 50 MG tablet Take 50-100 mg by mouth every 6 (six) hours as needed for moderate pain.    .Marland KitchenFLUoxetine (PROZAC) 20 MG tablet Take 20 mg by mouth daily.      Psychiatric Specialty Exam:     Blood pressure 127/65, pulse 82, temperature 98.1 F (36.7 C), temperature source Oral, resp. rate 16, SpO2 97 %.There is no weight on file to calculate BMI.  General  Appearance: Fairly Groomed  Engineer, water::  Good  Speech:  Normal Rate  Volume:  Normal  Mood:  Anxious  Affect:  appropriate, slightly anxious  Thought Process:  Irrelevant tangential   Orientation:  Other:  fully alert and attentive  Thought Content:  Paranoid Ideation, Rumination and denies hallucinations  Suicidal Thoughts:  No At this time denies any suicidal plan or intent and contracts for safety   Homicidal Thoughts:  No  Memory:  Recent and Remote grossly intact  Judgement:  Fair  Insight:  Lacking  Psychomotor Activity:  Normal  Concentration:  Good  Recall:  Good  Fund of Knowledge:Good  Language: Good  Akathisia:  Negative  Handed:  Right  AIMS (if indicated):     Assets:  Communication Skills Desire for Improvement Resilience Social Support  Sleep:      Musculoskeletal: Strength & Muscle Tone: within normal limits Gait & Station: normal Patient leans: N/A  Treatment Plan Summary: 1. At this time patient warrants inpatient psychiatric admission. There are no grounds for involuntary commitment , but she is currently agreeing, and does state " I know something is not right and I need help".  Patient is willing to sign in to hospital on voluntary basis and husband is supportive of this. 2. Will D/C Adderall due to concern it may be contributing to or exacerbating psychotic symptoms 3. Will  Start Abilify 5 mgrs QHS  Patient to be discharged to [facility] for stabilization of thought processes.    [place], [place] 07/24/2014 6:12 PM  Patient seen, evaluated and I agree with  notes by Nurse Practitioner. Corena Pilgrim, MD

## 2014-07-24 NOTE — BHH Counselor (Addendum)
[facility] from [facility]. Pt's bed is now ready and they've received pt's signed voluntary consent for treatment.  Evette Cristalaroline Paige Hank Walling, [place] Assessment Counselor    Per [facility] at [place], pt has been accepted by Dr. Deeann DowseBen Palumbo. # for report is [phone]. Nicholos JohnsKathleen will fax voluntary consent for pt to sign.   Evette Cristalaroline Paige Chad Donoghue, [place] Assessment Counselor [phone]

## 2014-07-24 NOTE — ED Notes (Signed)
Pt transported to [facility] by [facility] transportation service for continuation of specialized care. She left in no acute distress.

## 2014-07-24 NOTE — Progress Notes (Signed)
[facility] added pt to waitlist per Christiane HaJonathan, Charity fundraiserN.  Blain PaisMichelle L Anelis Hrivnak, MHT/NS

## 2014-07-24 NOTE — BHH Counselor (Signed)
Writer faxed requested EKG results to [facility] at [place]. Notified her that pt is ambulatory and has been calm today with a flat affect. Nicholos JohnsKathleen will Regulatory affairs officercall writer back.  Evette Cristalaroline Paige Rufino Staup, [place] Assessment Counselor

## 2014-07-24 NOTE — ED Provider Notes (Signed)
Pt stable awaiting placement  Joselyn Edling L Zubair Lofton, MD 07/24/14 0714 

## 2014-07-24 NOTE — ED Notes (Signed)
States she is too tired to talk

## 2014-07-24 NOTE — BHH Suicide Risk Assessment (Cosign Needed)
Suicide Risk Assessment  Discharge Assessment     Demographic Factors:  Caucasian  ToPsychiatric Specialty Exam:     Blood pressure 127/65, pulse 82, temperature 98.1 F (36.7 C), temperature source Oral, resp. rate 16, SpO2 97 %.There is no weight on file to calculate BMI.  General Appearance: Fairly Groomed  Patent attorneyye Contact:: Good  Speech: Normal Rate  Volume: Normal  Mood: Anxious  Affect: appropriate, slightly anxious  Thought Process: Irrelevant tangential   Orientation: Other: fully alert and attentive  Thought Content: Paranoid Ideation, Rumination and denies hallucinations  Suicidal Thoughts: No At this time denies any suicidal plan or intent and contracts for safety   Homicidal Thoughts: No  Memory: Recent and Remote grossly intact  Judgement: Fair  Insight: Lacking  Psychomotor Activity: Normal  Concentration: Good  Recall: Good  Fund of Knowledge:Good  Language: Good  Akathisia: Negative  Handed: Right  AIMS (if indicated):    Assets: Communication Skills Desire for Improvement Resilience Social Support  Sleep:     Musculoskeletal: Strength & Muscle Tone: within normal limits Gait & Station: normal Patient leans: N/A  total Time spent with patient: 30 minutes     Mental Status Per Nursing Assessment::   On Admission:   acute psychosis and paranoia  Current Mental Status by Physician: Remains acutely psychotic with paranoid ideations.    Loss Factors: none  Historical Factors: none  Risk Reduction Factors:   Living with another person, especially a relative, Positive social support and Positive coping skills or problem solving skills  Continued Clinical Symptoms:  psychosis  Cognitive Features That Contribute To Risk:  Loss of executive function    Suicide Risk:  Mild:  Suicidal ideation of limited frequency, intensity, duration, and specificity.  There are no identifiable plans, no  associated intent, mild dysphoria and related symptoms, good self-control (both objective and subjective assessment), few other risk factors, and identifiable protective factors, including available and accessible social support.  Discharge Diagnoses:   AXIS I:  Unspecified psychosis  AXIS II:  Deferred AXIS III:   Past Medical History  Diagnosis Date  . Fibromyalgia   . PTSD (post-traumatic stress disorder)   . Anxiety   . Depression   . DVT (deep venous thrombosis)   . PE (pulmonary embolism)   . ADD (attention deficit disorder)   . Muscle spasm     bad  . Fatigue     chronic   AXIS IV:  other psychosocial or environmental problems AXIS V:  31-40 impairment in reality testing  Plan Of Care/Follow-up recommendations:  Treatment Plan Summary: 1. At this time patient warrants inpatient psychiatric admission. There are no grounds for involuntary commitment , but she is currently agreeing, and does state " I know something is not right and I need help". Patient is willing to sign in to hospital on voluntary basis and husband is supportive of this. 2. Will D/C Adderall due to concern it may be contributing to or exacerbating psychotic symptoms 3. Will Start Abilify 5 mgrs QHS [facility] processes.     Is patient on multiple antipsychotic therapies at discharge:  No   Has Patient had three or more failed trials of antipsychotic monotherapy by history:  No  Recommended Plan for Multiple Antipsychotic Therapies: NA    Diane Richardson, Diane Richardson  PMH-NP  07/24/2014, 6:26 PM

## 2014-08-02 DIAGNOSIS — F319 Bipolar disorder, unspecified: Secondary | ICD-10-CM | POA: Insufficient documentation

## 2014-08-29 ENCOUNTER — Encounter: Payer: Self-pay | Admitting: Nurse Practitioner

## 2014-08-29 ENCOUNTER — Ambulatory Visit (INDEPENDENT_AMBULATORY_CARE_PROVIDER_SITE_OTHER): Payer: Medicare PPO | Admitting: Nurse Practitioner

## 2014-08-29 VITALS — BP 111/59 | HR 69 | Ht 61.0 in | Wt 124.0 lb

## 2014-08-29 DIAGNOSIS — F4310 Post-traumatic stress disorder, unspecified: Secondary | ICD-10-CM | POA: Insufficient documentation

## 2014-08-29 DIAGNOSIS — F05 Delirium due to known physiological condition: Secondary | ICD-10-CM

## 2014-08-29 DIAGNOSIS — F32A Depression, unspecified: Secondary | ICD-10-CM | POA: Insufficient documentation

## 2014-08-29 DIAGNOSIS — R410 Disorientation, unspecified: Secondary | ICD-10-CM

## 2014-08-29 DIAGNOSIS — F419 Anxiety disorder, unspecified: Secondary | ICD-10-CM | POA: Insufficient documentation

## 2014-08-29 DIAGNOSIS — Z86711 Personal history of pulmonary embolism: Secondary | ICD-10-CM | POA: Insufficient documentation

## 2014-08-29 DIAGNOSIS — R296 Repeated falls: Secondary | ICD-10-CM | POA: Insufficient documentation

## 2014-08-29 DIAGNOSIS — Z86718 Personal history of other venous thrombosis and embolism: Secondary | ICD-10-CM | POA: Insufficient documentation

## 2014-08-29 DIAGNOSIS — F329 Major depressive disorder, single episode, unspecified: Secondary | ICD-10-CM | POA: Insufficient documentation

## 2014-08-29 DIAGNOSIS — F988 Other specified behavioral and emotional disorders with onset usually occurring in childhood and adolescence: Secondary | ICD-10-CM | POA: Insufficient documentation

## 2014-08-29 NOTE — Patient Instructions (Signed)
Will set up for physical therapy fall risk/frequent falls Follow-up in 6 months with Dr. Vickey Hugerohmeier

## 2014-08-29 NOTE — Progress Notes (Addendum)
[facility]  PATIENT: Diane Richardson DOB: 10-28-1950   REASON FOR VISIT: follow up for memory loss    HISTORY OF PRESENT ILLNESS: Diane Richardson, 63 y.o. female , disabled due to anxiety and depression for many years before her numeric retirement age. She was last seen by Dr. Brett Fairy 02/07/13. She was referred from Dr. Tamala Julian , the patient had been referred on 08/30/2012 for a detailed neuropsychological testing to Dr. Valentina Shaggy at the rehabilitation center at [facility]. I have signed off this patient's care at that point.  The report was received and of March 2014. Dr. Valentina Shaggy mentioned that the patient missed her initial appointment was in and was 35 minutes late for a second recheck appointment. She is 25 minutes late at the time of arrival to my appointment.,too.  Dr. Toy Care is the patient's psychiatrist discontinued fluoxetine after receiving Dr. Francesca Oman neuropsychological test report. Diagnostic impressions were that the patient has problems with attentional control and executive functioning this may represent an exacerbation of an obsessive-compulsive or depressive condition cultivated alternatively, he would like her from a neurological prospective to be evaluated for frontal lobe dysfunction.  08/29/14:  Patient returns for her  yearly followup with her husband. She has had several falls since last seen most recent CT of the head in November without acute findings.  No  intracranial abnormality. Stable mild cerebral atrophy.Stable mild periventricular chronic white matter disease. The mental status exam performed today is stable. She continues to see Dr. Toy Care every 6 weeks to 3 months. she has had 2 inpatient behavioral health  admissions since last seen.  She returns for reevaluation   REVIEW OF SYSTEMS: Full 14 system review of systems performed and notable only for those listed, all others are neg:  Constitutional: fatigue  Cardiovascular: N/A  Ear/Nose/Throat: N/A    Skin: N/A  Eyes: N/A  Respiratory: N/A  Gastroitestinal: abdominal pain, constipation, nausea Hematology/Lymphatic: N/A  Endocrine: N/A Musculoskeletal:falls Allergy/Immunology: N/A  Neurological: Memory loss Psychiatric: bipolar depression Sleep : insomnia   ALLERGIES: Allergies  Allergen Reactions  . Codeine Hives and Diarrhea  . Penicillins Hives and Diarrhea  . Percocet [Oxycodone-Acetaminophen] Other (See Comments)    5-359m,delusional  . Percodan [Oxycodone-Aspirin] Other (See Comments)    delusional  . Donnatal [Belladonna Alk-Phenobarb Er] Rash    HOME MEDICATIONS: Outpatient Prescriptions Prior to Visit  Medication Sig Dispense Refill  . ARIPiprazole (ABILIFY) 5 MG tablet Take 1 tablet (5 mg total) by mouth daily.    .Marland Kitchenaspirin EC 81 MG tablet Take 81 mg by mouth daily.    . Cholecalciferol (VITAMIN D3) 3000 UNITS TABS Take 3,000 Units by mouth daily. For nutrition supplementation    . Coenzyme Q10 (COQ-10) 50 MG CAPS Take 1 capsule by mouth daily. One capsule with a meal for supplementation    . cyclobenzaprine (FLEXERIL) 10 MG tablet Take 10 mg by mouth 3 (three) times daily as needed for muscle spasms.    .Marland Kitchenibuprofen (ADVIL,MOTRIN) 200 MG tablet Take 400 mg by mouth every 6 (six) hours as needed for headache or moderate pain.    .Marland KitchenLORazepam (ATIVAN) 1 MG tablet Take 1 tablet (1 mg total) by mouth every 8 (eight) hours as needed for anxiety (agitation). 30 tablet 0  . metaxalone (SKELAXIN) 800 MG tablet Take 800 mg by mouth 3 (three) times daily. As needed for muscle spasm    . pregabalin (LYRICA) 50 MG capsule Take 1 capsule (50 mg total) by mouth 3 (three)  times daily.    . cephALEXin (KEFLEX) 500 MG capsule Take 1 capsule (500 mg total) by mouth every 8 (eight) hours. For 7 days current on day 2 for urinary tract infection    . DULoxetine (CYMBALTA) 60 MG capsule Take 2 capsules (120 mg total) by mouth daily.  3  . Nutritional Supplements (JUICE PLUS FIBRE) LIQD  Take 8 oz by mouth daily.     No facility-administered medications prior to visit.    PAST MEDICAL HISTORY: Past Medical History  Diagnosis Date  . Fibromyalgia   . PTSD (post-traumatic stress disorder)   . Anxiety   . Depression   . DVT (deep venous thrombosis)   . PE (pulmonary embolism)   . ADD (attention deficit disorder)   . Muscle spasm     bad  . Fatigue     chronic    PAST SURGICAL HISTORY: Past Surgical History  Procedure Laterality Date  . Childbirth  1989    x1,NVD  . Pulmonary embolism surgery  1983    x9 days    FAMILY HISTORY: Family History  Problem Relation Age of Onset  . CAD Father   . Hyperlipidemia Father   . Hypertension Father   . Hyperlipidemia Brother   . Diabetes Maternal Grandmother   . Cancer Maternal Grandmother     stomach  . Dementia Maternal Grandfather   . Colon polyps Maternal Grandfather   . CAD Paternal Grandmother   . Diabetes Paternal Grandmother   . Dementia Paternal Grandfather     SOCIAL HISTORY: History   Social History  . Marital Status: Married    Spouse Name: N/A    Number of Children: 1  . Years of Education: PHD   Occupational History  . disabled     FORMER TEACHER   Social History Main Topics  . Smoking status: Never Smoker   . Smokeless tobacco: Never Used  . Alcohol Use: Yes     Comment: 3 glasses of wine weekly  . Drug Use: No  . Sexual Activity: Not on file   Other Topics Concern  . Not on file   Social History Narrative     PHYSICAL EXAM  Filed Vitals:   08/29/14 1435  Height: _0  (1.549 m)  Weight: 124 lb (56.246 kg)   Body mass index is 23.44 kg/(m^2). General: well developed, well nourished, seated, in no evident distress Head: head normocephalic and atraumatic. Oropharynx benign Neck: supple with no carotid bruits Cardiovascular: regular rate and rhythm, no murmurs  Neurologic Exam Mental Status: Awake and fully alert. Oriented to place and time. MMSE 28/30 . AFT 20.  Follows all commands. Speech and language normal.  Cranial Nerves: Pupils equal, briskly reactive to light. Extraocular movements full without nystagmus. Visual fields full to confrontation. Hearing intact and symmetric to finger snap. Facial sensation intact. Face, tongue, palate move normally and symmetrically. Neck flexion and extension normal.  Motor: Normal bulk and tone. Normal strength in all tested extremity muscles.No focal weakness Sensory.: intact to touch and pinprick and vibratory.  Coordination: Rapid alternating movements normal in all extremities. Finger-to-nose and heel-to-shin performed accurately bilaterally. Gait and Station: Arises from chair without difficulty. Stance is normal. Able to heel, toe and tandem walk without difficulty.  Reflexes: 2+ and symmetric. Toes downgoing.    DIAGNOSTIC DATA (LABS, IMAGING, TESTING) - I reviewed patient records, labs, notes, testing and imaging myself where available.  Lab Results  Component Value Date   WBC 3.9* 07/22/2014  HGB 13.4 07/22/2014   HCT 39.8 07/22/2014   MCV 90.5 07/22/2014   PLT 231 07/22/2014      Component Value Date/Time   NA 141 07/22/2014 1131   K 4.7 07/22/2014 1131   CL 102 07/22/2014 1131   CO2 27 07/22/2014 1131   GLUCOSE 102* 07/22/2014 1131   BUN 16 07/22/2014 1131   CREATININE 0.72 07/22/2014 1131   CALCIUM 10.1 07/22/2014 1131   PROT 7.5 07/22/2014 1131   ALBUMIN 4.5 07/22/2014 1131   AST 18 07/22/2014 1131   ALT 13 07/22/2014 1131   ALKPHOS 66 07/22/2014 1131   BILITOT 0.4 07/22/2014 1131   GFRNONAA >90 07/22/2014 1131   GFRAA >90 07/22/2014 1131    ASSESSMENT AND PLAN  63 y.o. year old female  has a past medical history of subacute confusional state with memory loss Fibromyalgia; PTSD (post-traumatic stress disorder); Anxiety;Bipolar Depression;Fatigue. here to follow-up.  CT of the head in November without acute findings.  No  intracranial abnormality. Stable mild cerebral  atrophy.Stable mild periventricular chronic white matter disease.The patient is a current patient of Dr. Brett Fairy  who is out of the office today . This note is sent to the work in doctor.     Will set up for physical therapy fall risk/frequent falls Follow-up in 6 months with Dr. Brett Fairy Dennie Bible, [facility], [facility], [facility] [address], [place],  [address] [phone]  Personally examined patient and images, agree with history, physical, neuro exam as stated above. Agree with assessment and plan.   Sarina Ill, MD [facility]

## 2014-09-28 ENCOUNTER — Emergency Department (EMERGENCY_DEPARTMENT_HOSPITAL)
Admission: EM | Admit: 2014-09-28 | Discharge: 2014-09-29 | Disposition: A | Discharge status: Other Acute Inpatient Hospital | Payer: Medicare PPO | Source: Home / Self Care | Attending: Emergency Medicine | Admitting: Emergency Medicine

## 2014-09-28 ENCOUNTER — Encounter (HOSPITAL_COMMUNITY): Payer: Self-pay | Admitting: Emergency Medicine

## 2014-09-28 DIAGNOSIS — F419 Anxiety disorder, unspecified: Secondary | ICD-10-CM

## 2014-09-28 DIAGNOSIS — F310 Bipolar disorder, current episode hypomanic: Secondary | ICD-10-CM

## 2014-09-28 DIAGNOSIS — Z86711 Personal history of pulmonary embolism: Secondary | ICD-10-CM | POA: Diagnosis not present

## 2014-09-28 DIAGNOSIS — F3110 Bipolar disorder, current episode manic without psychotic features, unspecified: Secondary | ICD-10-CM

## 2014-09-28 DIAGNOSIS — R413 Other amnesia: Secondary | ICD-10-CM | POA: Diagnosis not present

## 2014-09-28 DIAGNOSIS — F600 Paranoid personality disorder: Secondary | ICD-10-CM

## 2014-09-28 DIAGNOSIS — F4310 Post-traumatic stress disorder, unspecified: Secondary | ICD-10-CM | POA: Diagnosis present

## 2014-09-28 DIAGNOSIS — Z86718 Personal history of other venous thrombosis and embolism: Secondary | ICD-10-CM

## 2014-09-28 DIAGNOSIS — Z880 Allergy status to penicillin: Secondary | ICD-10-CM | POA: Insufficient documentation

## 2014-09-28 DIAGNOSIS — G4700 Insomnia, unspecified: Secondary | ICD-10-CM | POA: Diagnosis present

## 2014-09-28 DIAGNOSIS — Z792 Long term (current) use of antibiotics: Secondary | ICD-10-CM | POA: Insufficient documentation

## 2014-09-28 DIAGNOSIS — Z833 Family history of diabetes mellitus: Secondary | ICD-10-CM | POA: Diagnosis not present

## 2014-09-28 DIAGNOSIS — Z79899 Other long term (current) drug therapy: Secondary | ICD-10-CM

## 2014-09-28 DIAGNOSIS — F312 Bipolar disorder, current episode manic severe with psychotic features: Secondary | ICD-10-CM | POA: Diagnosis present

## 2014-09-28 DIAGNOSIS — F22 Delusional disorders: Secondary | ICD-10-CM | POA: Diagnosis present

## 2014-09-28 DIAGNOSIS — F329 Major depressive disorder, single episode, unspecified: Secondary | ICD-10-CM | POA: Diagnosis not present

## 2014-09-28 DIAGNOSIS — H9190 Unspecified hearing loss, unspecified ear: Secondary | ICD-10-CM | POA: Diagnosis not present

## 2014-09-28 DIAGNOSIS — Z800 Family history of malignant neoplasm of digestive organs: Secondary | ICD-10-CM | POA: Diagnosis not present

## 2014-09-28 DIAGNOSIS — G319 Degenerative disease of nervous system, unspecified: Secondary | ICD-10-CM | POA: Diagnosis not present

## 2014-09-28 DIAGNOSIS — G3184 Mild cognitive impairment, so stated: Secondary | ICD-10-CM | POA: Diagnosis present

## 2014-09-28 DIAGNOSIS — R410 Disorientation, unspecified: Secondary | ICD-10-CM | POA: Diagnosis not present

## 2014-09-28 DIAGNOSIS — Z7982 Long term (current) use of aspirin: Secondary | ICD-10-CM

## 2014-09-28 DIAGNOSIS — Z8739 Personal history of other diseases of the musculoskeletal system and connective tissue: Secondary | ICD-10-CM | POA: Insufficient documentation

## 2014-09-28 DIAGNOSIS — R45851 Suicidal ideations: Secondary | ICD-10-CM

## 2014-09-28 DIAGNOSIS — Z8249 Family history of ischemic heart disease and other diseases of the circulatory system: Secondary | ICD-10-CM | POA: Diagnosis not present

## 2014-09-28 DIAGNOSIS — M797 Fibromyalgia: Secondary | ICD-10-CM | POA: Diagnosis present

## 2014-09-28 DIAGNOSIS — F988 Other specified behavioral and emotional disorders with onset usually occurring in childhood and adolescence: Secondary | ICD-10-CM | POA: Diagnosis present

## 2014-09-28 DIAGNOSIS — R110 Nausea: Secondary | ICD-10-CM | POA: Diagnosis not present

## 2014-09-28 DIAGNOSIS — R262 Difficulty in walking, not elsewhere classified: Secondary | ICD-10-CM | POA: Diagnosis not present

## 2014-09-28 LAB — RAPID URINE DRUG SCREEN, HOSP PERFORMED
Amphetamines: NOT DETECTED
Barbiturates: NOT DETECTED
Benzodiazepines: NOT DETECTED
Cocaine: NOT DETECTED
OPIATES: NOT DETECTED
Tetrahydrocannabinol: NOT DETECTED

## 2014-09-28 LAB — CBC
HEMATOCRIT: 37.2 % (ref 36.0–46.0)
HEMOGLOBIN: 12.1 g/dL (ref 12.0–15.0)
MCH: 30.6 pg (ref 26.0–34.0)
MCHC: 32.5 g/dL (ref 30.0–36.0)
MCV: 93.9 fL (ref 78.0–100.0)
PLATELETS: 192 10*3/uL (ref 150–400)
RBC: 3.96 MIL/uL (ref 3.87–5.11)
RDW: 13 % (ref 11.5–15.5)
WBC: 3 10*3/uL — AB (ref 4.0–10.5)

## 2014-09-28 LAB — URINALYSIS, ROUTINE W REFLEX MICROSCOPIC
Bilirubin Urine: NEGATIVE
Glucose, UA: NEGATIVE mg/dL
HGB URINE DIPSTICK: NEGATIVE
Ketones, ur: NEGATIVE mg/dL
Leukocytes, UA: NEGATIVE
NITRITE: NEGATIVE
Protein, ur: NEGATIVE mg/dL
Specific Gravity, Urine: 1.023 (ref 1.005–1.030)
UROBILINOGEN UA: 0.2 mg/dL (ref 0.0–1.0)
pH: 6 (ref 5.0–8.0)

## 2014-09-28 LAB — ACETAMINOPHEN LEVEL

## 2014-09-28 LAB — COMPREHENSIVE METABOLIC PANEL
ALT: 22 U/L (ref 0–35)
AST: 20 U/L (ref 0–37)
Albumin: 4.2 g/dL (ref 3.5–5.2)
Alkaline Phosphatase: 52 U/L (ref 39–117)
Anion gap: 7 (ref 5–15)
BUN: 27 mg/dL — AB (ref 6–23)
CALCIUM: 9.2 mg/dL (ref 8.4–10.5)
CHLORIDE: 105 meq/L (ref 96–112)
CO2: 25 mmol/L (ref 19–32)
CREATININE: 0.9 mg/dL (ref 0.50–1.10)
GFR calc Af Amer: 78 mL/min — ABNORMAL LOW (ref >90)
GFR calc non Af Amer: 67 mL/min — ABNORMAL LOW (ref >90)
Glucose, Bld: 93 mg/dL (ref 70–99)
POTASSIUM: 4 mmol/L (ref 3.5–5.1)
SODIUM: 137 mmol/L (ref 135–145)
Total Bilirubin: 0.3 mg/dL (ref 0.3–1.2)
Total Protein: 7 g/dL (ref 6.0–8.3)

## 2014-09-28 LAB — SALICYLATE LEVEL

## 2014-09-28 LAB — ETHANOL

## 2014-09-28 LAB — VALPROIC ACID LEVEL: Valproic Acid Lvl: 52.9 ug/mL (ref 50.0–100.0)

## 2014-09-28 MED ORDER — CYCLOBENZAPRINE HCL 10 MG PO TABS: 10.0000 mg | ORAL_TABLET | Freq: Three times a day (TID) | ORAL | Status: DC | PRN

## 2014-09-28 MED ORDER — ASPIRIN EC 81 MG PO TBEC
81.0000 mg | DELAYED_RELEASE_TABLET | Freq: Every day | ORAL | Status: DC
  Administered 2014-09-29: 81 mg via ORAL
  Filled 2014-09-28: qty 1

## 2014-09-28 MED ORDER — DIVALPROEX SODIUM ER 500 MG PO TB24
500.0000 mg | ORAL_TABLET | Freq: Every day | ORAL | Status: DC
  Administered 2014-09-28: 500 mg via ORAL
  Filled 2014-09-28 (×2): qty 1

## 2014-09-28 MED ORDER — ARIPIPRAZOLE 15 MG PO TABS
30.0000 mg | ORAL_TABLET | Freq: Every day | ORAL | Status: DC
  Administered 2014-09-28 – 2014-09-29 (×2): 30 mg via ORAL
  Filled 2014-09-28 (×2): qty 2

## 2014-09-28 MED ORDER — BENZTROPINE MESYLATE 1 MG PO TABS
1.0000 mg | ORAL_TABLET | Freq: Every day | ORAL | Status: DC
  Administered 2014-09-28: 1 mg via ORAL
  Filled 2014-09-28: qty 1

## 2014-09-28 MED ORDER — CLONAZEPAM 1 MG PO TABS: 1.0000 mg | ORAL_TABLET | Freq: Every day | ORAL | Status: DC

## 2014-09-28 MED ORDER — CLONAZEPAM 0.5 MG PO TABS
0.5000 mg | ORAL_TABLET | Freq: Two times a day (BID) | ORAL | Status: DC
  Administered 2014-09-28 – 2014-09-29 (×2): 0.5 mg via ORAL
  Filled 2014-09-28 (×2): qty 1

## 2014-09-28 MED ORDER — HYDROXYZINE HCL 25 MG PO TABS: 25.0000 mg | ORAL_TABLET | Freq: Three times a day (TID) | ORAL | Status: DC | PRN
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
## 2014-09-28 NOTE — ED Notes (Signed)
[facility] Expected date: 09/28/14 Expected time:  Means of arrival:  Comments: Hold for Triage 4

## 2014-09-28 NOTE — [facility] Counselor (Deleted)
Contacted [facility] ED to set-up tele-assess at 1550. Difficulty in setting up tele-assess.  Wolfgang PhoenixBrandi Shamela Haydon, [facility] T[facility] Specialist

## 2014-09-28 NOTE — ED Provider Notes (Signed)
CSN: [id number]     Arrival date & time 09/28/14  1335 History   First MD Initiated Contact with Patient 09/28/14 1547     Chief Complaint  Patient presents with  . Paranoid     (Consider location/radiation/quality/duration/timing/severity/associated sxs/prior Treatment) HPI Comments: Patient with a history of PTSD, depression who presents today for worsening paranoia, delusions, disorganization and being hyperverbal. Patient was recently admitted to a psychiatric facility and treated with various medications. Since being home which as been between 3 and 4 weeks family had noted significant and severe memory loss. They spoke with her psychiatrist Dr. Lafayette Dragonarr who 2 weeks ago recommended that she stop Wellbutrin and decrease her Klonopin to 1 mg.  Since changing the medications the patient has started to display the above symptoms. They spoke with Dr. Lafayette Dragonarr yesterday who recommended she come to the emergency room. Patient and family deny any alcohol or drug use. She did finish an antibiotic yesterday for a possible UTI. Otherwise no other medication changes.  She denies HI or SI  The history is provided by the patient, the spouse and a relative.    Past Medical History  Diagnosis Date  . Fibromyalgia   . PTSD (post-traumatic stress disorder)   . Anxiety   . Depression   . DVT (deep venous thrombosis)   . PE (pulmonary embolism)   . ADD (attention deficit disorder)   . Muscle spasm     bad  . Fatigue     chronic   Past Surgical History  Procedure Laterality Date  . Childbirth  1989    x1,NVD  . Pulmonary embolism surgery  1983    x9 days   Family History  Problem Relation Age of Onset  . CAD Father   . Hyperlipidemia Father   . Hypertension Father   . Hyperlipidemia Brother   . Diabetes Maternal Grandmother   . Cancer Maternal Grandmother     stomach  . Dementia Maternal Grandfather   . Colon polyps Maternal Grandfather   . CAD Paternal Grandmother   . Diabetes Paternal  Grandmother   . Dementia Paternal Grandfather    History  Substance Use Topics  . Smoking status: Never Smoker   . Smokeless tobacco: Never Used  . Alcohol Use: Yes     Comment: 3 glasses of wine weekly   OB History    No data available     Review of Systems  All other systems reviewed and are negative.     Allergies  Codeine; Penicillins; Percocet; Percodan; and Donnatal  Home Medications   Prior to Admission medications   Medication Sig Start Date End Date Taking? Authorizing Provider  ARIPiprazole (ABILIFY) 30 MG tablet Take 30 mg by mouth daily.   Yes Historical Provider, MD  aspirin EC 81 MG tablet Take 81 mg by mouth daily.   Yes Historical Provider, MD  benztropine (COGENTIN) 1 MG tablet Take 1 mg by mouth at bedtime.  08/21/14  Yes Historical Provider, MD  buPROPion (WELLBUTRIN XL) 300 MG 24 hr tablet Take 300 mg by mouth. 08/02/14 08/02/15 Yes Historical Provider, MD  Cholecalciferol (VITAMIN D3) 3000 UNITS TABS Take 3,000 Units by mouth daily. For nutrition supplementation 07/24/14  Yes Bonnetta BarryShelly Eisbach, NP  clonazePAM (KLONOPIN) 2 MG tablet Take 1 mg by mouth at bedtime.  08/02/14 09/28/14 Yes Historical Provider, MD  Coenzyme Q10 (COQ-10) 50 MG CAPS Take 1 capsule by mouth daily. One capsule with a meal for supplementation 07/24/14  Yes Bonnetta BarryShelly Eisbach, NP  cyclobenzaprine (FLEXERIL) 10 MG tablet Take 10 mg by mouth 3 (three) times daily as needed for muscle spasms.   Yes Historical Provider, MD  divalproex (DEPAKOTE ER) 500 MG 24 hr tablet Take 1 tablet by mouth at bedtime. 08/10/14  Yes Historical Provider, MD  ibuprofen (ADVIL,MOTRIN) 200 MG tablet Take 400 mg by mouth every 6 (six) hours as needed for headache or moderate pain.   Yes Historical Provider, MD  metaxalone (SKELAXIN) 800 MG tablet Take 800 mg by mouth 3 (three) times daily. As needed for muscle spasm   Yes Historical Provider, MD  sulfamethoxazole-trimethoprim (BACTRIM DS,SEPTRA DS) 800-160 MG per tablet  Take 1 tablet by mouth 2 (two) times daily.   Yes Historical Provider, MD  traMADol (ULTRAM) 50 MG tablet Take 50 mg by mouth every 6 (six) hours as needed for moderate pain.  08/02/14  Yes Historical Provider, MD  LORazepam (ATIVAN) 1 MG tablet Take 1 tablet (1 mg total) by mouth every 8 (eight) hours as needed for anxiety (agitation). Patient not taking: Reported on 08/29/2014 07/24/14   Bonnetta Barry, NP   BP 127/47 mmHg  Pulse 79  Temp(Src) 98 F (36.7 C) (Oral)  Resp 19  SpO2 99% Physical Exam  Constitutional: She is oriented to person, place, and time. She appears well-developed and well-nourished. No distress.  HENT:  Head: Normocephalic and atraumatic.  Mouth/Throat: Oropharynx is clear and moist.  Eyes: Conjunctivae and EOM are normal. Pupils are equal, round, and reactive to light.  Neck: Normal range of motion. Neck supple.  Cardiovascular: Normal rate, regular rhythm and intact distal pulses.   No murmur heard. Pulmonary/Chest: Effort normal and breath sounds normal. No respiratory distress. She has no wheezes. She has no rales.  Abdominal: Soft. She exhibits no distension. There is no tenderness. There is no rebound and no guarding.  Musculoskeletal: Normal range of motion. She exhibits no edema or tenderness.  Neurological: She is alert and oriented to person, place, and time.  Skin: Skin is warm and dry. No rash noted. No erythema.  Psychiatric: She has a normal mood and affect. Her speech is tangential. She is actively hallucinating. Thought content is paranoid and delusional. She exhibits abnormal recent memory.  Nursing note and vitals reviewed.   ED Course  Procedures (including critical care time) Labs Review Labs Reviewed  CBC - Abnormal; Notable for the following:    WBC 3.0 (*)    All other components within normal limits  COMPREHENSIVE METABOLIC PANEL - Abnormal; Notable for the following:    BUN 27 (*)    GFR calc non Af Amer 67 (*)    GFR calc Af Amer  78 (*)    All other components within normal limits  ACETAMINOPHEN LEVEL - Abnormal; Notable for the following:    Acetaminophen (Tylenol), Serum <10.0 (*)    All other components within normal limits  ETHANOL  URINE RAPID DRUG SCREEN (HOSP PERFORMED)  VALPROIC ACID LEVEL  SALICYLATE LEVEL  URINALYSIS, ROUTINE W REFLEX MICROSCOPIC    Imaging Review No results found.   EKG Interpretation None      MDM   Final diagnoses:  Paranoid  Hallucination    Patient recently admitted to [facility] for medication changes and has been out several weeks however family noted worsening memory loss and heard psychiatrist Dr. Lafayette Dragon recommended that she stop Wellbutrin and decrease her Klonopin to 1 mg 2 weeks ago. Since that time family notes that she is having worsening delusions and paranoid thoughts.  She is becoming disorganized and hyperverbal. On exam patient talks about seeing people playing a game on a daily and she tried to hit them with a golf club and she states it's games for kids.  Patient is upset because she is no longer in charge of her medications.  She has no complaints today except for the hallucinations. She denies any suicidal or homicidal ideation. She denies any substance or alcohol use.  Also has been states patient was seen last week and given antibiotics for possible urinary tract infection. She finished antibiotic yesterday.  CBC, CMP, EtOH, UDS, valproic acid, acetaminophen, salicylate, UA pending. TTS to evaluate  9:56 PM Labs wnl.  Pt medically clear  Gwyneth Sprout, MD 09/28/14 2156

## 2014-09-28 NOTE — ED Notes (Addendum)
Family members are going to take clothes

## 2014-09-28 NOTE — ED Notes (Signed)
Patient brought over from main ED.  She is ambulatory and alert.  States she does not belong here, that her family is trying to punish her because she has uncovered a plan that the government is in on to kill all the penguins, horses, dogs and other animals native to [place].  States she wants to find a lawyer to get her out of here.  States she takes pills that her family gives her, but does not know what they are.  She was oriented to her room and to the unit.  She was offered food and a snack.  She accepted a Diet Coke.

## 2014-09-28 NOTE — ED Notes (Signed)
Per husband stopped Wellbutrin and decreased her Klonopin dosage-states her short term memory has been affected lately and she has not been making sense-states symptoms improved once Wellbutrin was stopped-now paranoid and seeing things-accusatory towards husband/daughter-saw psychiatrist last week and told them to bring her here-patient disorganized, hyper-verbal

## 2014-09-28 NOTE — BH Assessment (Signed)
Assessment Note  Diane Richardson is an 64 y.o. female who was brought in by her daughter and husband after becoming increasingly confused, paranoid with delusions. She states that she is angry because "no one thinks she can do anything for herself". She is upset because her family wont let her manage her own medications and took her car keys from her. She states that she feels that she is capable of taking care of herself and feels "trapped in her house". When asked if she is feeling suicidal she stated "well I'll do whatever I have to get out of that house, I'll break a window and cut myself with glass if I have to". Pt appeared to be confused when trying to remember dates and events and her family corrected her which agitated her further. She was often tangential in thinking and did not answer many of the questions directly. When asked if she heard or saw things she stated she was "starting to see shadows that looked like people." She denied HI.    Collateral- her daughter Diane Richardson stated that she fell off her horse October of 2015 and lost her memory for a period of time due what was described to her by the drs. As a concussion. In November she was brought to the hospital for bizarre behavior, paranoia and confusion. She was sent to [facility] and discharged home. Daughter stated that she has been to [facility] since then and they found some "disruption in her frontal lobe". She says that her mom has not been right since she was released from [facility] but her behaviors have escalated in the last week. She stated that her mom is "talking to people that aren't there" and thinks everyone is out to get her including her and her dad. She stated that she found her mom in the car with her dog in the freezing cold yesterday.   Disposition is inpatient placement per Reginold Agent NP   Axis I: 296.44 Bipolar 1 disorder, current or most recent episode manic with psychotic features Axis II: Deferred Axis III:  Past  Medical History  Diagnosis Date  . Fibromyalgia   . PTSD (post-traumatic stress disorder)   . Anxiety   . Depression   . DVT (deep venous thrombosis)   . PE (pulmonary embolism)   . ADD (attention deficit disorder)   . Muscle spasm     bad  . Fatigue     chronic   Axis IV: other psychosocial or environmental problems and problems with primary support group Axis V: 11-20 some danger of hurting self or others possible OR occasionally fails to maintain minimal personal hygiene OR gross impairment in communication  Past Medical History:  Past Medical History  Diagnosis Date  . Fibromyalgia   . PTSD (post-traumatic stress disorder)   . Anxiety   . Depression   . DVT (deep venous thrombosis)   . PE (pulmonary embolism)   . ADD (attention deficit disorder)   . Muscle spasm     bad  . Fatigue     chronic    Past Surgical History  Procedure Laterality Date  . Childbirth  1989    x1,NVD  . Pulmonary embolism surgery  1983    x9 days    Family History:  Family History  Problem Relation Age of Onset  . CAD Father   . Hyperlipidemia Father   . Hypertension Father   . Hyperlipidemia Brother   . Diabetes Maternal Grandmother   . Cancer Maternal Grandmother  stomach  . Dementia Maternal Grandfather   . Colon polyps Maternal Grandfather   . CAD Paternal Grandmother   . Diabetes Paternal Grandmother   . Dementia Paternal Grandfather     Social History:  reports that she has never smoked. She has never used smokeless tobacco. She reports that she drinks alcohol. She reports that she does not use illicit drugs.  Additional Social History:  Alcohol / Drug Use Pain Medications: Pt denies Prescriptions: Wellbutrin, Klonopin Over the Counter: Pt denies History of alcohol / drug use?: No history of alcohol / drug abuse Longest period of sobriety (when/how long): NA Substance #1 Name of Substance 1: Hx Alcohol use- moderate  1 - Frequency: not using now, family has out of  the house  CIWA: CIWA-Ar BP: (!) 127/47 mmHg Pulse Rate: 79 COWS:    Allergies:  Allergies  Allergen Reactions  . Codeine Hives and Diarrhea  . Penicillins Hives and Diarrhea  . Percocet [Oxycodone-Acetaminophen] Other (See Comments)    5-$R'325mg'rE$ ,delusional  . Percodan [Oxycodone-Aspirin] Other (See Comments)    delusional  . Donnatal [Belladonna Alk-Phenobarb Er] Rash    Home Medications:  (Not in a hospital admission)  OB/GYN Status:  No LMP recorded. Patient is postmenopausal.  General Assessment Data Location of Assessment: [facility] ED ACT Assessment: Yes Is this a Tele or Face-to-Face Assessment?: Tele Assessment Is this an Initial Assessment or a Re-assessment for this encounter?: Initial Assessment Living Arrangements: Spouse/significant other Can pt return to current living arrangement?: Yes Admission Status: Voluntary Is patient capable of signing voluntary admission?: Yes Transfer from: Home Referral Source: Self/Family/Friend     [place] Living Arrangements: Spouse/significant other Name of Psychiatrist:  (Dr. Toy Care) Name of Therapist:  (unknown)  Education Status Is patient currently in school?: No Current Grade: NA Highest grade of school patient has completed: masters Name of school: N/A Contact person: N/A  Risk to self with the past 6 months Suicidal Ideation: No (but states she would do anything to get out of her house) Suicidal Intent: No Is patient at risk for suicide?: No (but can not be safe on her own) Suicidal Plan?: No Access to Means: No What has been your use of drugs/alcohol within the last 12 months?:  (No alcohol in the house ) Previous Attempts/Gestures: Yes How many times?:  (1(confirmed)) Other Self Harm Risks:  (confusion and paranoia) Triggers for Past Attempts: Unknown Intentional Self Injurious Behavior: None Family Suicide History: Unknown Recent stressful life event(s): Other (Comment) (confusion, conflict with  family ) Persecutory voices/beliefs?: Yes (paranoid that others are out to get her) Depression: Yes Depression Symptoms: Tearfulness Substance abuse history and/or treatment for substance abuse?: No Suicide prevention information given to non-admitted patients: Not applicable  Risk to Others within the past 6 months Homicidal Ideation: No Thoughts of Harm to Others: No Current Homicidal Intent: No Current Homicidal Plan: No Access to Homicidal Means: No Identified Victim:  (N/A) History of harm to others?: No Assessment of Violence: None Noted Violent Behavior Description:  (N/A) Does patient have access to weapons?: No Criminal Charges Pending?: No Does patient have a court date: No  Psychosis Hallucinations: Auditory, Visual (sees shadows, daughter says she talks to people that arent t) Delusions: Persecutory  Mental Status Report Appear/Hygiene: Bizarre Eye Contact: Good Motor Activity: Freedom of movement Speech: Incoherent, Tangential Level of Consciousness: Alert Mood: Angry Affect: Angry, Apprehensive Anxiety Level: Moderate Thought Processes: Tangential, Flight of Ideas Judgement: Impaired Orientation: Not oriented Obsessive Compulsive Thoughts/Behaviors: Unable  to Assess  Cognitive Functioning Concentration: Decreased Memory: Recent Impaired, Remote Impaired IQ: Above Average Insight: Poor Impulse Control: Poor Appetite: Good Weight Loss: 0 (0.) Weight Gain: 0 Sleep: No Change Total Hours of Sleep:  (8 hours) Vegetative Symptoms: Unable to Assess  ADLScreening [facility] Assessment Services) Patient's cognitive ability adequate to safely complete daily activities?: No Patient able to express need for assistance with ADLs?: Yes Independently performs ADLs?: Yes (appropriate for developmental age)  Prior Inpatient Therapy Prior Inpatient Therapy: Yes Prior Therapy Dates:  (november 2015) Prior Therapy Facilty/Provider(s):  [facility]) Reason for  Treatment:  (confusion, paranoia)  Prior Outpatient Therapy Prior Outpatient Therapy: Yes Prior Therapy Dates:  (unknown) Prior Therapy Facilty/Provider(s): unknown Reason for Treatment: unknown  ADL Screening (condition at time of admission) Patient's cognitive ability adequate to safely complete daily activities?: No Is the patient deaf or have difficulty hearing?: No Does the patient have difficulty seeing, even when wearing glasses/contacts?: No Does the patient have difficulty concentrating, remembering, or making decisions?: Yes Patient able to express need for assistance with ADLs?: Yes Does the patient have difficulty dressing or bathing?: No Independently performs ADLs?: Yes (appropriate for developmental age) Does the patient have difficulty walking or climbing stairs?: No Weakness of Legs: None Weakness of Arms/Hands: None  Home Assistive Devices/Equipment Home Assistive Devices/Equipment: None    Abuse/Neglect Assessment (Assessment to be complete while patient is alone) Physical Abuse: Denies Verbal Abuse: Denies Sexual Abuse: Denies Exploitation of patient/patient's resources: Denies Self-Neglect: Yes, past (Comment) (From mother) Values / Beliefs Cultural Requests During Hospitalization: None Spiritual Requests During Hospitalization: None   Advance Directives (For Healthcare) Does patient have an advance directive?: Yes Would patient like information on creating an advanced directive?: No - patient declined information    Additional Information 1:1 In Past 12 Months?: No CIRT Risk: No Elopement Risk: Yes (possibly ) Does patient have medical clearance?: No     Disposition:  Disposition Initial Assessment Completed for this Encounter: Yes Disposition of Patient: Inpatient treatment program Type of inpatient treatment program: Adult  On Site Evaluation by:   Reviewed with Physician:    Bedelia Person 09/28/2014 4:12 PM

## 2014-09-28 NOTE — [facility] Counselor (Deleted)
Consult came in at [phone]. Writer received consult at 1542.  Wolfgang PhoenixBrandi Harlie Ragle, [facility] Triage Specialist

## 2014-09-28 NOTE — ED Notes (Signed)
Daughter visiting with patient at present, no distress noted, pt rambles.  Awake, alert & responsive, will continue to monitor for safety.

## 2014-09-28 NOTE — ED Notes (Signed)
[id number] Expected date: 09/28/14 Expected time:  Means of arrival:  Comments: Hold for Triage 3

## 2014-09-29 ENCOUNTER — Encounter (HOSPITAL_COMMUNITY): Payer: Self-pay | Admitting: *Deleted

## 2014-09-29 ENCOUNTER — Inpatient Hospital Stay (HOSPITAL_COMMUNITY)
Admission: AD | Admit: 2014-09-29 | Discharge: 2014-10-12 | DRG: 885 | Disposition: A | Discharge status: Home Health | Payer: Medicare PPO | Source: Intra-hospital | Attending: Psychiatry | Admitting: Psychiatry

## 2014-09-29 DIAGNOSIS — Z86711 Personal history of pulmonary embolism: Secondary | ICD-10-CM

## 2014-09-29 DIAGNOSIS — F310 Bipolar disorder, current episode hypomanic: Secondary | ICD-10-CM | POA: Diagnosis present

## 2014-09-29 DIAGNOSIS — R262 Difficulty in walking, not elsewhere classified: Secondary | ICD-10-CM | POA: Diagnosis not present

## 2014-09-29 DIAGNOSIS — F329 Major depressive disorder, single episode, unspecified: Secondary | ICD-10-CM | POA: Diagnosis not present

## 2014-09-29 DIAGNOSIS — F4310 Post-traumatic stress disorder, unspecified: Secondary | ICD-10-CM | POA: Diagnosis present

## 2014-09-29 DIAGNOSIS — Z833 Family history of diabetes mellitus: Secondary | ICD-10-CM

## 2014-09-29 DIAGNOSIS — G4700 Insomnia, unspecified: Secondary | ICD-10-CM | POA: Diagnosis present

## 2014-09-29 DIAGNOSIS — F419 Anxiety disorder, unspecified: Secondary | ICD-10-CM | POA: Diagnosis present

## 2014-09-29 DIAGNOSIS — R443 Hallucinations, unspecified: Secondary | ICD-10-CM | POA: Insufficient documentation

## 2014-09-29 DIAGNOSIS — G319 Degenerative disease of nervous system, unspecified: Secondary | ICD-10-CM | POA: Diagnosis not present

## 2014-09-29 DIAGNOSIS — R413 Other amnesia: Secondary | ICD-10-CM | POA: Diagnosis not present

## 2014-09-29 DIAGNOSIS — Z800 Family history of malignant neoplasm of digestive organs: Secondary | ICD-10-CM | POA: Diagnosis not present

## 2014-09-29 DIAGNOSIS — G3184 Mild cognitive impairment, so stated: Secondary | ICD-10-CM | POA: Diagnosis present

## 2014-09-29 DIAGNOSIS — Z86718 Personal history of other venous thrombosis and embolism: Secondary | ICD-10-CM

## 2014-09-29 DIAGNOSIS — Z8249 Family history of ischemic heart disease and other diseases of the circulatory system: Secondary | ICD-10-CM | POA: Diagnosis not present

## 2014-09-29 DIAGNOSIS — F32A Depression, unspecified: Secondary | ICD-10-CM | POA: Diagnosis present

## 2014-09-29 DIAGNOSIS — H9190 Unspecified hearing loss, unspecified ear: Secondary | ICD-10-CM | POA: Diagnosis not present

## 2014-09-29 DIAGNOSIS — R45851 Suicidal ideations: Secondary | ICD-10-CM

## 2014-09-29 DIAGNOSIS — M797 Fibromyalgia: Secondary | ICD-10-CM | POA: Diagnosis present

## 2014-09-29 DIAGNOSIS — F1310 Sedative, hypnotic or anxiolytic abuse, uncomplicated: Secondary | ICD-10-CM | POA: Diagnosis present

## 2014-09-29 DIAGNOSIS — R41841 Cognitive communication deficit: Secondary | ICD-10-CM

## 2014-09-29 DIAGNOSIS — R4189 Other symptoms and signs involving cognitive functions and awareness: Secondary | ICD-10-CM

## 2014-09-29 DIAGNOSIS — R110 Nausea: Secondary | ICD-10-CM | POA: Diagnosis not present

## 2014-09-29 LAB — TSH: TSH: 2.71 u[IU]/mL (ref 0.350–4.500)

## 2014-09-29 MED ORDER — DIVALPROEX SODIUM ER 500 MG PO TB24
500.0000 mg | ORAL_TABLET | Freq: Every day | ORAL | Status: DC
  Administered 2014-09-29 – 2014-10-02 (×4): 500 mg via ORAL
  Filled 2014-09-29 (×8): qty 1

## 2014-09-29 MED ORDER — MAGNESIUM HYDROXIDE 400 MG/5ML PO SUSP: 30.0000 mL | Freq: Every day | ORAL | Status: DC | PRN

## 2014-09-29 MED ORDER — ARIPIPRAZOLE 15 MG PO TABS: 15.0000 mg | ORAL_TABLET | Freq: Every day | ORAL | Status: DC

## 2014-09-29 MED ORDER — ACETAMINOPHEN 325 MG PO TABS: 650.0000 mg | ORAL_TABLET | Freq: Four times a day (QID) | ORAL | Status: DC | PRN

## 2014-09-29 MED ORDER — ALUM & MAG HYDROXIDE-SIMETH 200-200-20 MG/5ML PO SUSP
30.0000 mL | ORAL | Status: DC | PRN
  Administered 2014-10-12: 30 mL via ORAL
  Filled 2014-09-29: qty 30

## 2014-09-29 MED ORDER — CLONAZEPAM 0.5 MG PO TABS: 0.5000 mg | ORAL_TABLET | Freq: Every day | ORAL | Status: DC

## 2014-09-29 MED ORDER — BENZTROPINE MESYLATE 1 MG PO TABS
1.0000 mg | ORAL_TABLET | Freq: Every day | ORAL | Status: DC
  Administered 2014-09-29 – 2014-10-01 (×3): 1 mg via ORAL
  Filled 2014-09-29 (×6): qty 1

## 2014-09-29 MED ORDER — QUETIAPINE FUMARATE 50 MG PO TABS: 50.0000 mg | ORAL_TABLET | Freq: Every day | ORAL | Status: DC

## 2014-09-29 MED ORDER — ALUM & MAG HYDROXIDE-SIMETH 200-200-20 MG/5ML PO SUSP: 30.0000 mL | ORAL | Status: DC | PRN

## 2014-09-29 MED ORDER — CYCLOBENZAPRINE HCL 10 MG PO TABS: 5.0000 mg | ORAL_TABLET | Freq: Three times a day (TID) | ORAL | Status: DC | PRN

## 2014-09-29 MED ORDER — CLONAZEPAM 1 MG PO TABS
1.0000 mg | ORAL_TABLET | Freq: Every day | ORAL | Status: DC
  Administered 2014-09-29 – 2014-10-01 (×3): 1 mg via ORAL
  Filled 2014-09-29 (×3): qty 1

## 2014-09-29 MED ORDER — TRAZODONE HCL 50 MG PO TABS
50.0000 mg | ORAL_TABLET | Freq: Every evening | ORAL | Status: DC | PRN
  Administered 2014-09-29 – 2014-10-05 (×4): 50 mg via ORAL
  Filled 2014-09-29 (×5): qty 1

## 2014-09-29 MED ORDER — DIVALPROEX SODIUM 250 MG PO DR TAB
250.0000 mg | DELAYED_RELEASE_TABLET | Freq: Two times a day (BID) | ORAL | Status: DC
  Administered 2014-09-29: 250 mg via ORAL
  Filled 2014-09-29: qty 1

## 2014-09-29 MED ORDER — ARIPIPRAZOLE 15 MG PO TABS
30.0000 mg | ORAL_TABLET | Freq: Every day | ORAL | Status: DC
  Administered 2014-09-30 – 2014-10-02 (×3): 30 mg via ORAL
  Filled 2014-09-29 (×5): qty 2

## 2014-09-29 MED ORDER — TRAZODONE HCL 50 MG PO TABS: 50.0000 mg | ORAL_TABLET | Freq: Every evening | ORAL | Status: DC | PRN

## 2014-09-29 NOTE — Consult Note (Signed)
[facility] Consult   Reason for Consult:  Paranoia, suicide threat Referring Physician:  EDP Patient Identification: Diane Richardson MRN:  [id number] Principal Diagnosis: Paranoia Diagnosis:   Patient Active Problem List   Diagnosis Date Noted  . Bipolar affective disorder, current episode hypomanic [F31.0] 09/29/2014    Priority: High  . ADD (attention deficit disorder) [F90.9] 08/29/2014    Priority: High  . Paranoia [F22]     Priority: High  . Frequent falls [R29.6] 08/29/2014  . Anxiety [F41.9] 08/29/2014  . H/O deep venous thrombosis [Z86.718] 08/29/2014  . Healed or old pulmonary embolism [Z86.711] 08/29/2014  . 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 General psychoses [F29] 07/24/2014  . Subacute confusional state [F05] 02/07/2013    Total Time spent with patient: 30 minutes  Subjective:   Diane Richardson is a 64 y.o. female patient admitted with Paranoia, suicide threat  HPI:  Patient remains tangential, hypomanic.  She is upset with husband and her daughter.  Odie is felling "pretty terrible" and feels she has done nothing wrong.  However, she does admit saying that she would hurt herself if she could not leave her house.  The patient is difficult to get to answer questions directly.  She has suffered multiple injuries on her horse, including hitting her head.  The patient has not been taking her medications.  She claims her husband is trying to control her and her daughter is "messed up herself."  Patient is unstable. HPI Elements:   Location:  Bipolar disorder, manic with Psychotic symptoms. Quality:  confusion, paranoia, anxiety. Severity:  severe. Timing:  gradual onset, ongoing. Duration:  Last November. Context:  Brought in by husband and daughter for increasing Paranoia and confusion.  Past Medical History:  Past Medical History  Diagnosis Date  . Fibromyalgia   . PTSD (post-traumatic stress disorder)   . Anxiety   . Depression    . DVT (deep venous thrombosis)   . PE (pulmonary embolism)   . ADD (attention deficit disorder)   . Muscle spasm     bad  . Fatigue     chronic    Past Surgical History  Procedure Laterality Date  . Childbirth  1989    x1,NVD  . Pulmonary embolism surgery  1983    x9 days   Family History:  Family History  Problem Relation Age of Onset  . CAD Father   . Hyperlipidemia Father   . Hypertension Father   . Hyperlipidemia Brother   . Diabetes Maternal Grandmother   . Cancer Maternal Grandmother     stomach  . Dementia Maternal Grandfather   . Colon polyps Maternal Grandfather   . CAD Paternal Grandmother   . Diabetes Paternal Grandmother   . Dementia Paternal Grandfather    Social History:  History  Alcohol Use  . Yes    Comment: 3 glasses of wine weekly     History  Drug Use No    History   Social History  . Marital Status: Married    Spouse Name: N/A    Number of Children: 1  . Years of Education: PHD   Occupational History  . disabled     FORMER TEACHER   Social History Main Topics  . Smoking status: Never Smoker   . Smokeless tobacco: Never Used  . Alcohol Use: Yes     Comment: 3 glasses of wine weekly  . Drug Use: No  . Sexual Activity: None   Other Topics Concern  .  None   Social History Narrative   Additional Social History:    Pain Medications: Pt denies Prescriptions: Wellbutrin, Klonopin Over the Counter: Pt denies History of alcohol / drug use?: No history of alcohol / drug abuse Longest period of sobriety (when/how long): NA Name of Substance 1: Hx Alcohol use- moderate  1 - Frequency: not using now, family has out of the house                   Allergies:   Allergies  Allergen Reactions  . Codeine Hives and Diarrhea  . Penicillins Hives and Diarrhea  . Percocet [Oxycodone-Acetaminophen] Other (See Comments)    5-365m,delusional  . Percodan [Oxycodone-Aspirin] Other (See Comments)    delusional  . Donnatal  [Belladonna Alk-Phenobarb Er] Rash    Vitals: Blood pressure 129/66, pulse 66, temperature 97.7 F (36.5 C), temperature source Oral, resp. rate 14, SpO2 98 %.  Risk to Self: Suicidal Ideation: No (but states she would do anything to get out of her house) Suicidal Intent: No Is patient at risk for suicide?: No (but can not be safe on her own) Suicidal Plan?: No Access to Means: No What has been your use of drugs/alcohol within the last 12 months?:  (No alcohol in the house ) How many times?:  (1(confirmed)) Other Self Harm Risks:  (confusion and paranoia) Triggers for Past Attempts: Unknown Intentional Self Injurious Behavior: None Risk to Others: Homicidal Ideation: No Thoughts of Harm to Others: No Current Homicidal Intent: No Current Homicidal Plan: No Access to Homicidal Means: No Identified Victim:  (N/A) History of harm to others?: No Assessment of Violence: None Noted Violent Behavior Description:  (N/A) Does patient have access to weapons?: No Criminal Charges Pending?: No Does patient have a court date: No Prior Inpatient Therapy: Prior Inpatient Therapy: Yes Prior Therapy Dates:  (november 2015) Prior Therapy Facilty/Provider(s):  ([facility] Reason for Treatment:  (confusion, paranoia) Prior Outpatient Therapy: Prior Outpatient Therapy: Yes Prior Therapy Dates:  (unknown) Prior Therapy Facilty/Provider(s): unknown Reason for Treatment: unknown  Current Facility-Administered Medications  Medication Dose Route Frequency Provider Last Rate Last Dose  . [START ON 09/30/2014] ARIPiprazole (ABILIFY) tablet 15 mg  15 mg Oral Daily JWaylan Boga NP      . aspirin EC tablet 81 mg  81 mg Oral Daily WBlanchie Dessert MD   81 mg at 09/29/14 0956  . benztropine (COGENTIN) tablet 1 mg  1 mg Oral QHS WBlanchie Dessert MD   1 mg at 09/28/14 2136  . [START ON 09/30/2014] clonazePAM (KLONOPIN) tablet 0.5 mg  0.5 mg Oral QHS JWaylan Boga NP      . cyclobenzaprine (FLEXERIL) tablet  5 mg  5 mg Oral TID PRN JWaylan Boga NP      . divalproex (DEPAKOTE) DR tablet 250 mg  250 mg Oral Q12H JWaylan Boga NP      . hydrOXYzine (ATARAX/VISTARIL) tablet 25 mg  25 mg Oral Q8H PRN JDelfin Gant NP      . QUEtiapine (SEROQUEL) tablet 50 mg  50 mg Oral QHS JWaylan Boga NP       Current Outpatient Prescriptions  Medication Sig Dispense Refill  . ARIPiprazole (ABILIFY) 30 MG tablet Take 30 mg by mouth daily.    .Marland Kitchenaspirin EC 81 MG tablet Take 81 mg by mouth daily.    . benztropine (COGENTIN) 1 MG tablet Take 1 mg by mouth at bedtime.   0  . buPROPion (WELLBUTRIN XL) 300 MG 24 hr tablet  Take 300 mg by mouth.    . Cholecalciferol (VITAMIN D3) 3000 UNITS TABS Take 3,000 Units by mouth daily. For nutrition supplementation    . clonazePAM (KLONOPIN) 2 MG tablet Take 1 mg by mouth at bedtime.     . Coenzyme Q10 (COQ-10) 50 MG CAPS Take 1 capsule by mouth daily. One capsule with a meal for supplementation    . cyclobenzaprine (FLEXERIL) 10 MG tablet Take 10 mg by mouth 3 (three) times daily as needed for muscle spasms.    . divalproex (DEPAKOTE ER) 500 MG 24 hr tablet Take 1 tablet by mouth at bedtime.  0  . ibuprofen (ADVIL,MOTRIN) 200 MG tablet Take 400 mg by mouth every 6 (six) hours as needed for headache or moderate pain.    . metaxalone (SKELAXIN) 800 MG tablet Take 800 mg by mouth 3 (three) times daily. As needed for muscle spasm    . sulfamethoxazole-trimethoprim (BACTRIM DS,SEPTRA DS) 800-160 MG per tablet Take 1 tablet by mouth 2 (two) times daily.    . traMADol (ULTRAM) 50 MG tablet Take 50 mg by mouth every 6 (six) hours as needed for moderate pain.     Marland Kitchen LORazepam (ATIVAN) 1 MG tablet Take 1 tablet (1 mg total) by mouth every 8 (eight) hours as needed for anxiety (agitation). (Patient not taking: Reported on 08/29/2014) 30 tablet 0    Musculoskeletal: Strength & Muscle Tone: within normal limits Gait & Station: normal Patient leans: N/A  Psychiatric Specialty Exam:      Blood pressure 129/66, pulse 66, temperature 97.7 F (36.5 C), temperature source Oral, resp. rate 14, SpO2 98 %.There is no weight on file to calculate BMI.  General Appearance: Casual  Eye Contact::  Good  Speech:  Clear and Coherent and Normal Rate  Volume:  Normal  Mood:  Angry, Anxious and fearful  Affect:  Congruent and Tearful  Thought Process:  Disorganized and Loose  Orientation:  Full (Time, Place, and Person)  Thought Content:  Delusions, Hallucinations: Visual and Paranoid Ideation  Suicidal Thoughts:  No  Homicidal Thoughts:  No  Memory:  Immediate;   Poor Recent;   Fair Remote;   Good  Judgement:  Poor  Insight:  Shallow  Psychomotor Activity:  Normal  Concentration:  Poor  Recall:  [place] of Knowledge:Fair  Language: Good  Akathisia:  NA  Handed:  Right  AIMS (if indicated):     Assets:  Desire for Improvement  ADL's:  Intact  Cognition: Impaired,  Severe  Sleep:      Medical Decision Making: Review or order clinical lab tests (1), Review and summation of old records (2), Established Problem, Worsening (2), Review or order medicine tests (1), Review of Medication Regimen & Side Effects (2) and Review of New Medication or Change in Dosage (2)  Problem Points: Established problem, worsening (2) and Review of psycho-social stressors (1)  Data Points: Review and summation of old records (2) Review of medication regiment & side effects (2) Review of new medications or change in dosage (2)  Treatment Plan Summary: Daily contact with patient to assess and evaluate symptoms and progress in treatment, Medication management and Plan Seek admission at a Geropsychiatric unit  Plan:  Recommend psychiatric Inpatient admission when medically cleared. Disposition: Admit patient  Waylan Boga   PMHNP-BC 09/29/2014 10:07 AM  Patient seen, evaluated and I agree with notes by Nurse Practitioner. Corena Pilgrim, MD

## 2014-09-29 NOTE — BH Assessment (Signed)
[facility] Assessment Progress Note  Per Thedore MinsMojeed Akintayo, MD pt is to be admitted to a psychiatric facility.  Per Berneice Heinrichina Tate, RN, [facility], pt has been assigned to Rm 500-2.  Dr Jannifer FranklinAkintayo has initiated IVC, and paperwork has been faxed to the magistrate with service of Findings and Custody Order pending.  Pt's nurse has been notified.  Doylene Canninghomas Lateya Dauria, MA Triage Specialist 09/29/2014 @ 15:10

## 2014-09-29 NOTE — Progress Notes (Signed)
ADMISSION NOTE: D: Patient is alert and not oriented to place or time. Pt states "today feels like a Monday." Pt asked "am [place] in [place]. I thought I was in [place]." Pt's mood and affect is depressed and flat. Pt's eye contact is fair and pt is tearful at times. Pt's speech is soft, tangential, and word salad. Pt is bizarre, delusional, and paranoid. Pt reports she does not know why she is in the hospital and states "I don't need to be here I'm not stupid, I have a masters degree." During psych assessment pt stated "I'd be better off dead" however pt denies having suicidal thoughts, pt denies HI and AVH but reports history of seeing items like "desks float," and reports "I hear things most people don't, my hearing is excellent." Pt states "It's like an optical illusion." Pt reports her current stressors are "feelings of not being in control" and "the team that was actually involved in the cover," her daughter wanting to get "land [place] that her father owns, and "I'm extremely depressed." Pt reports her husband and daughter "may be calling me to testify." Pt reports her support system is her "horses and friends at the barn." Pt unsteady on feet with history of falls. Pt's husband arrived for visiting hours at 1830. A: Pt oriented to unit. Pt offered nutrition. Pt given hygiene supplies. Pt given time to ask questions and express concerns. NP, May placed orders and orders acknowledged. Wheel chair provided to pt. Encouragement/Support provided to pt. 15 minute checks initiated per protocol for patient safety.  R: Pt verbally contracts not to harm self. Pt oriented to unit with ease. Patient cooperative and receptive to nursing interventions.

## 2014-09-29 NOTE — Tx Team (Signed)
Initial Interdisciplinary Treatment Plan   PATIENT STRESSORS: Medication change or noncompliance   PATIENT STRENGTHS: Ability for insight Average or above average intelligence Capable of independent living Supportive family/friends   PROBLEM LIST: Problem List/Patient Goals Date to be addressed Date deferred Reason deferred Estimated date of resolution  Suicide Risk/Safety 09/29/2014     Depression 09/29/2014     Bipolar 09/29/2014     Paranoia  09/29/2014     "Escape and escape" 09/29/2014                              DISCHARGE CRITERIA:  Improved stabilization in mood, thinking, and/or behavior Verbal commitment to aftercare and medication compliance  PRELIMINARY DISCHARGE PLAN: Placement in alternative living arrangements  PATIENT/FAMIILY INVOLVEMENT: This treatment plan has been presented to and reviewed with the patient, Diane Richardson.  The patient and family have been given the opportunity to ask questions and make suggestions.  Diane Richardson, Diane Richardson 09/29/2014, 5:43 PM

## 2014-09-29 NOTE — ED Notes (Signed)
Patient's thinking disorganized with tangential and flight of ideas. Feels that she doesn't need to be here. Possible placement in short term treatment place.

## 2014-09-29 NOTE — Progress Notes (Signed)
D: client visible on the unit, reports "really hadn't sleep since I was a child" "I need help sleeping" "I was tricked into coming here, my husband pretended he was having a heart attack and when we got to the hospital all of a sudden he was better, then turned their attention to me" Client reports anxiety at "8" of 10, this is my third admission, this been going on since November, from one hospital to the other, that's probably why I'm so anxious" "I go to one hospital and they change my medicine, another one make more changes, I'm just..." "I been on Klonopin for years" "My daughter gives me my medicine how she wants to give it, she don't know a lot about these things, she's not that smart, I mean, well she didn't go to college so..." A: Writer introduced self to client, provided emotional support, assured her that the on call doctor/extender would be notified to review and order her medications. Client also informed she will see the physician tomorrow. Staff will monitor q715min for safety. R: client is safe on the unit, notified Kirby CriglerFran H, NP for medication orders (see MAR). Medications administered as ordered.

## 2014-09-30 DIAGNOSIS — F310 Bipolar disorder, current episode hypomanic: Principal | ICD-10-CM

## 2014-09-30 DIAGNOSIS — R443 Hallucinations, unspecified: Secondary | ICD-10-CM

## 2014-09-30 MED ORDER — IBUPROFEN 600 MG PO TABS
600.0000 mg | ORAL_TABLET | Freq: Four times a day (QID) | ORAL | Status: DC | PRN
  Administered 2014-09-30 – 2014-10-11 (×4): 600 mg via ORAL
  Filled 2014-09-30 (×4): qty 1

## 2014-09-30 NOTE — [facility] Group Notes (Signed)
[facility] LCSW Group Therapy Note  09/30/2014 / 11:15 AM  Type of Therapy and Topic:  Group Therapy: Avoiding Self-Sabotaging and Enabling Behaviors  Participation Level:  Did Not Attend. Patient choose not to attend group after MHT informed patient of group time and location shortly before group began.    Carney Bernatherine C Harrill, LCSW

## 2014-09-30 NOTE — H&P (Signed)
Psychiatric Admission Assessment Adult  Patient Identification: Diane Richardson MRN:  [id number] Date of Evaluation:  09/30/2014 Chief Complaint:  BIPOLAR DISORDER Principal Diagnosis: Bipolar affective disorder, current episode hypomanic Diagnosis:   Patient Active Problem List   Diagnosis Date Noted  . Bipolar affective disorder, current episode hypomanic [F31.0] 09/29/2014  . Suicidal ideations [R45.851] 09/29/2014  . Depression [F32.9] 09/29/2014  . Hallucination [R44.3]   . Frequent falls [R29.6] 08/29/2014  . ADD (attention deficit disorder) [F90.9] 08/29/2014  . Anxiety [F41.9] 08/29/2014  . H/O deep venous thrombosis [Z86.718] 08/29/2014  . Healed or old pulmonary embolism [Z86.711] 08/29/2014  . 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 General psychoses [F29] 07/24/2014  . Paranoia [F22]   . Subacute confusional state [F05] 02/07/2013   History of Present Illness: Diane Richardson is a 64 y.o. female patient admitted with Paranoia and suicide threat.  She presented to [facility] tangential and hypomanic upset with husband and daughter per ED report.  Patient came in stating she was feeling bad and would hurt self if she was not able to her house.  She was accepted at [facility] for inpatient treatment.  Patient was disorganized and challenging to follow her thought patterns.  She mentioned a weekly "pill box" and that my daughter had messed up filling them" The patient is difficult to get to answer questions directly. She states that she has suffered multiple injuries on her horse, including hitting her head. The patient has not been taking her medications.   She appears somnolent.  She is not talkative and hard to get answers to questions directly.  She is not exhibiting disruptive behaviors on the unit.  She is adherent to medication regimen.    HPI Elements: Location: Bipolar disorder, manic with Psychotic symptoms. Quality: confusion, paranoia, anxiety. Severity:  severe. Timing: gradual onset, ongoing. Duration: Last November. Context: Brought in by husband and daughter for increasing Paranoia and confusion.  Associated Signs/Symptoms: Depression Symptoms:  depressed mood, hopelessness, (Hypo) Manic Symptoms:  Flight of Ideas, Labiality of Mood, Anxiety Symptoms:  Excessive Worry, Psychotic Symptoms:  Paranoia, PTSD Symptoms: Negative Total Time spent with patient: 30 minutes  Past Medical History:  Past Medical History  Diagnosis Date  . Fibromyalgia   . PTSD (post-traumatic stress disorder)   . Anxiety   . Depression   . DVT (deep venous thrombosis)   . PE (pulmonary embolism)   . ADD (attention deficit disorder)   . Muscle spasm     bad  . Fatigue     chronic    Past Surgical History  Procedure Laterality Date  . Childbirth  1989    x1,NVD  . Pulmonary embolism surgery  1983    x9 days   Family History:  Family History  Problem Relation Age of Onset  . CAD Father   . Hyperlipidemia Father   . Hypertension Father   . Hyperlipidemia Brother   . Diabetes Maternal Grandmother   . Cancer Maternal Grandmother     stomach  . Dementia Maternal Grandfather   . Colon polyps Maternal Grandfather   . CAD Paternal Grandmother   . Diabetes Paternal Grandmother   . Dementia Paternal Grandfather    Social History:  History  Alcohol Use  . Yes    Comment: 3 glasses of wine weekly     History  Drug Use No    History   Social History  . Marital Status: Married    Spouse Name: N/A    Number of Children:  1  . Years of Education: PHD   Occupational History  . disabled     FORMER TEACHER   Social History Main Topics  . Smoking status: Never Smoker   . Smokeless tobacco: Never Used  . Alcohol Use: Yes     Comment: 3 glasses of wine weekly  . Drug Use: No  . Sexual Activity: Yes   Other Topics Concern  . None   Social History Narrative   Additional Social History:    History of alcohol / drug use?: No  history of alcohol / drug abuse  Musculoskeletal: Strength & Muscle Tone: slightly weakened Gait & Station: normal Patient leans: N/A  Psychiatric Specialty Exam: Physical Exam  Vitals reviewed. Psychiatric: Her mood appears anxious. She exhibits a depressed mood.    Review of Systems  Constitutional: Negative.   HENT: Negative.   Eyes: Negative.   Respiratory: Negative.   Cardiovascular: Negative.   Gastrointestinal: Negative.   Genitourinary: Negative.   Musculoskeletal: Negative.   Skin: Negative.   Neurological: Negative.   Endo/Heme/Allergies: Negative.   Psychiatric/Behavioral: Positive for depression. Negative for suicidal ideas, hallucinations, memory loss and substance abuse. The patient is nervous/anxious. The patient does not have insomnia.     Blood pressure 87/48, pulse 87, temperature 97.3 F (36.3 C), temperature source Oral, resp. rate 16, height 5' 1"  (1.549 m), weight 55.792 kg (123 lb), SpO2 100 %.Body mass index is 23.25 kg/(m^2).   General Appearance: Casual  Eye Contact:: Good  Speech: Clear and Coherent and Normal Rate  Volume: Normal  Mood: Angry, Anxious and fearful  Affect: Congruent and Tearful  Thought Process: Disorganized and Loose  Orientation: Full (Time, Place, and Person)  Thought Content: Delusions, Hallucinations: Visual and Paranoid Ideation  Suicidal Thoughts: No  Homicidal Thoughts: No  Memory: Immediate; Poor Recent; Fair Remote; Good  Judgement: Poor  Insight: Shallow  Psychomotor Activity: Normal  Concentration: Poor  Recall: [place] of Knowledge:Fair  Language: Good  Akathisia: NA  Handed: Right  AIMS (if indicated):    Assets: Desire for Improvement  ADL's: Intact  Cognition: Impaired, Severe  Sleep:     Risk to Self: Is patient at risk for suicide?: Yes Risk to Others:   Prior Inpatient Therapy:   Prior Outpatient Therapy:    Alcohol Screening: 1. How  often do you have a drink containing alcohol?: 2 to 3 times a week 2. How many drinks containing alcohol do you have on a typical day when you are drinking?: 5 or 6 3. How often do you have six or more drinks on one occasion?: Never Preliminary Score: 2 4. How often during the last year have you found that you were not able to stop drinking once you had started?: Less than monthly 5. How often during the last year have you failed to do what was normally expected from you becasue of drinking?: Never 6. How often during the last year have you needed a first drink in the morning to get yourself going after a heavy drinking session?: Never 7. How often during the last year have you had a feeling of guilt of remorse after drinking?: Never 8. How often during the last year have you been unable to remember what happened the night before because you had been drinking?: Weekly 9. Have you or someone else been injured as a result of your drinking?: No 10. Has a relative or friend or a doctor or another health worker been concerned about your drinking or  suggested you cut down?: No Alcohol Use Disorder Identification Test Final Score (AUDIT): 9 Brief Intervention: Yes  Allergies:   Allergies  Allergen Reactions  . Codeine Hives and Diarrhea  . Penicillins Hives and Diarrhea  . Percocet [Oxycodone-Acetaminophen] Other (See Comments)    5-357m,delusional  . Percodan [Oxycodone-Aspirin] Other (See Comments)    delusional  . Donnatal [Belladonna Alk-Phenobarb Er] Rash   Lab Results:  Results for orders placed or performed during the hospital encounter of 09/29/14 (from the past 48 hour(s))  TSH     Status: None   Collection Time: 09/29/14  7:30 PM  Result Value Ref Range   TSH 2.710 0.350 - 4.500 uIU/mL    Comment: Performed at [facility]  Current Medications: Current Facility-Administered Medications  Medication Dose Route Frequency Provider Last Rate Last Dose  . acetaminophen  (TYLENOL) tablet 650 mg  650 mg Oral Q6H PRN SJanett Labella NP      . alum & mag hydroxide-simeth (MAALOX/MYLANTA) 200-200-20 MG/5ML suspension 30 mL  30 mL Oral Q4H PRN SJanett Labella NP      . ARIPiprazole (ABILIFY) tablet 30 mg  30 mg Oral Daily FLurena Nida NP   30 mg at 09/30/14 1009  . benztropine (COGENTIN) tablet 1 mg  1 mg Oral QHS FLurena Nida NP   1 mg at 09/29/14 2138  . clonazePAM (KLONOPIN) tablet 1 mg  1 mg Oral Daily FLurena Nida NP   1 mg at 09/30/14 1009  . divalproex (DEPAKOTE ER) 24 hr tablet 500 mg  500 mg Oral QHS FLurena Nida NP   500 mg at 09/29/14 2138  . magnesium hydroxide (MILK OF MAGNESIA) suspension 30 mL  30 mL Oral Daily PRN SFreda MunroMay Agustin, NP      . traZODone (DESYREL) tablet 50 mg  50 mg Oral QHS PRN SJanett Labella NP   50 mg at 09/29/14 2138   PTA Medications: Prescriptions prior to admission  Medication Sig Dispense Refill Last Dose  . ARIPiprazole (ABILIFY) 30 MG tablet Take 30 mg by mouth daily.   09/27/2014 at Unknown time  . aspirin EC 81 MG tablet Take 81 mg by mouth daily.   09/28/2014 at Unknown time  . benztropine (COGENTIN) 1 MG tablet Take 1 mg by mouth at bedtime.   0 09/27/2014 at Unknown time  . buPROPion (WELLBUTRIN XL) 300 MG 24 hr tablet Take 300 mg by mouth.   09/25/2014 at Unknown time  . Cholecalciferol (VITAMIN D3) 3000 UNITS TABS Take 3,000 Units by mouth daily. For nutrition supplementation   09/28/2014 at Unknown time  . clonazePAM (KLONOPIN) 2 MG tablet Take 1 mg by mouth at bedtime.    09/27/2014 at Unknown time  . Coenzyme Q10 (COQ-10) 50 MG CAPS Take 1 capsule by mouth daily. One capsule with a meal for supplementation   09/28/2014 at Unknown time  . cyclobenzaprine (FLEXERIL) 10 MG tablet Take 10 mg by mouth 3 (three) times daily as needed for muscle spasms.   unknown  . divalproex (DEPAKOTE ER) 500 MG 24 hr tablet Take 1 tablet by mouth at bedtime.  0 09/27/2014 at Unknown time  . ibuprofen (ADVIL,MOTRIN) 200 MG  tablet Take 400 mg by mouth every 6 (six) hours as needed for headache or moderate pain.   Past Week at Unknown time  . LORazepam (ATIVAN) 1 MG tablet Take 1 tablet (1 mg total) by mouth every 8 (eight) hours as needed for  anxiety (agitation). (Patient not taking: Reported on 08/29/2014) 30 tablet 0 Not Taking at Unknown time  . metaxalone (SKELAXIN) 800 MG tablet Take 800 mg by mouth 3 (three) times daily. As needed for muscle spasm   unknown  . sulfamethoxazole-trimethoprim (BACTRIM DS,SEPTRA DS) 800-160 MG per tablet Take 1 tablet by mouth 2 (two) times daily.   09/27/2014 at Unknown time  . traMADol (ULTRAM) 50 MG tablet Take 50 mg by mouth every 6 (six) hours as needed for moderate pain.    unknown   Previous Psychotropic Medications:  yes  Substance Abuse History in the last 12 months:  No.  Consequences of Substance Abuse: NA  Results for orders placed or performed during the hospital encounter of 09/29/14 (from the past 72 hour(s))  TSH     Status: None   Collection Time: 09/29/14  7:30 PM  Result Value Ref Range   TSH 2.710 0.350 - 4.500 uIU/mL    Comment: Performed at [facility]   Observation Level/Precautions:  15 minute checks  Laboratory:  per ED  Psychotherapy:  Group therapy  Medications:  As per medlist  Consultations:  As needed  Discharge Concerns:  Safety  Estimated LOS:  5-7 days  Other:     Psychological Evaluations: Yes   Treatment Plan Summary: Daily contact with patient to assess and evaluate symptoms and progress in treatment and Medication management.  Possible dc Monday. Depakote XR 500 mg QD and Abilify 30 mg QD for mood stabilization Klonopin 1 mg daily for anxiety Cogentin 1 mg for EPS  Medical Decision Making:  Review of Psycho-Social Stressors (1), Discuss test with performing physician (1), Review and summation of old records (2), Established Problem, Worsening (2), Review or order medicine tests (1) and Review of Medication Regimen & Side  Effects (2)  I certify that inpatient services furnished can reasonably be expected to improve the patient's condition.   Kerrie Buffalo MAY, AGNP-BC 1/23/201610:46 AM  I have personally seen the patient and agreed with the findings and involved in the treatment plan. Berniece Andreas, MD

## 2014-09-30 NOTE — Progress Notes (Signed)
Did not attend group 

## 2014-09-30 NOTE — Progress Notes (Signed)
Patient ID: Diane Richardson, female   DOB: 03/30/1951, 64 y.o.   MRN: [id number]   D: Pt has been very disorganized on the unit today, she has repeatedly went up to staff reporting that she is suppose to be moved to the 5th floor and that someone was stealing her things. Pt remains very paranoid and continues to think that people are after her to include her roommate. Pt has required frequent redirection about her not having any belongings at time of admission and that she was not going to be moved to the 5th floor. Pt reported that her depression was a 0, and that her helplessness was a 0.  Pt reported being negative SI/HI, no AH/VH noted. A: 15 min checks continued for patient safety. R: Pt safety maintained.

## 2014-09-30 NOTE — [facility] Suicide Risk Assessment (Signed)
[facility] Admission Suicide Risk Assessment   Nursing information obtained from:  Patient Demographic factors:  Caucasian, Unemployed Current Mental Status:  Suicidal ideation indicated by patient Loss Factors:  NA Historical Factors:  NA Risk Reduction Factors:  Sense of responsibility to family, Living with another person, especially a relative, Positive social support Total Time spent with patient: 45 minutes Principal Problem: Bipolar affective disorder, current episode hypomanic Diagnosis:   Patient Active Problem List   Diagnosis Date Noted  . Bipolar affective disorder, current episode hypomanic [F31.0] 09/29/2014  . Suicidal ideations [R45.851] 09/29/2014  . Depression [F32.9] 09/29/2014  . Hallucination [R44.3]   . Frequent falls [R29.6] 08/29/2014  . ADD (attention deficit disorder) [F90.9] 08/29/2014  . Anxiety [F41.9] 08/29/2014  . H/O deep venous thrombosis [Z86.718] 08/29/2014  . Healed or old pulmonary embolism [Z86.711] 08/29/2014  . 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 General psychoses [F29] 07/24/2014  . Paranoia [F22]   . Subacute confusional state [F05] 02/07/2013     Continued Clinical Symptoms:  Alcohol Use Disorder Identification Test Final Score (AUDIT): 9 The "Alcohol Use Disorders Identification Test", Guidelines for Use in Primary Care, Second Edition.  World Science writer [facility]). Score between 0-7:  no or low risk or alcohol related problems. Score between 8-15:  moderate risk of alcohol related problems. Score between 16-19:  high risk of alcohol related problems. Score 20 or above:  warrants further diagnostic evaluation for alcohol dependence and treatment.   CLINICAL FACTORS:   Bipolar Disorder:   Mixed State Currently Psychotic Unstable or Poor Therapeutic Relationship Previous Psychiatric Diagnoses and Treatments   Musculoskeletal: Strength & Muscle Tone: within normal limits Gait & Station: unsteady, Requires  wheelchair Patient leans: Front and Backward  Psychiatric Specialty Exam: Physical Exam  ROS  Blood pressure 87/48, pulse 87, temperature 97.3 F (36.3 C), temperature source Oral, resp. rate 16, height  (1.549 m), weight 55.792 kg (123 lb), SpO2 100 %.Body mass index is 23.25 kg/(m^2).  General Appearance: Guarded  Eye Contact::  Minimal  Speech:  Rambling  Volume:  Increased  Mood:  Angry, Hopeless and Irritable  Affect:  Labile  Thought Process:  Circumstantial, Disorganized, Irrelevant, Loose and Tangential  Orientation:  Full (Time, Place, and Person)  Thought Content:  Delusions, Hallucinations: Visual, Paranoid Ideation and Rumination  Suicidal Thoughts:  Passive suicidal thoughts but no plan or intent.  Homicidal Thoughts:  No  Memory:  Immediate;   Fair Recent;   Fair Remote;   Fair  Judgement:  Impaired  Insight:  Lacking  Psychomotor Activity:  Increased  Concentration:  Poor  Recall:  Poor  Fund of Knowledge:Poor  Language: Fair  Akathisia:  No  Handed:  Right  AIMS (if indicated):     Assets:  Desire for Improvement Housing Social Support  Sleep:     Cognition: Impaired,  Moderate  ADL's:  Intact     COGNITIVE FEATURES THAT CONTRIBUTE TO RISK:  Closed-mindedness, Loss of executive function, Polarized thinking and Thought constriction (tunnel vision)    SUICIDE RISK:   Moderate:  Frequent suicidal ideation with limited intensity, and duration, some specificity in terms of plans, no associated intent, good self-control, limited dysphoria/symptomatology, some risk factors present, and identifiable protective factors, including available and accessible social support.  PLAN OF CARE: Patient is 64 year old Caucasian married female who was admitted because of paranoia, psychosis, hallucination and having suicidal thoughts if she could not leave her house.  Upon admission she was very difficult to get answer.  She was admitted in November at [place] but  did not provide details.  She mentioned it was a planned game from her husband.  Patient remains very psychotic and manic.  She requires psychotropic medication, stabilization at inpatient services.  Restart home medication and psychotropic medication.  Encouraged to participate in group milieu therapy.  Length of stay 3-5 days.  Medical Decision Making:  New problem, with additional work up planned, Review of Psycho-Social Stressors (1), Review or order clinical lab tests (1), Decision to obtain old records (1), Review and summation of old records (2), Established Problem, Worsening (2), Review of Medication Regimen & Side Effects (2) and Review of New Medication or Change in Dosage (2)  I certify that inpatient services furnished can reasonably be expected to improve the patient's condition.   Thorn Demas T. 09/30/2014, 12:23 PM

## 2014-09-30 NOTE — Progress Notes (Signed)
Patient ID: [name], female   DOB: 08/30/1951, 64 y.o.   MRN: [id number] Attempted to complete PSA with patient after lunch was unsuccessful as she insisted we speak later. Will try once again 1/24//16. Carney Bernatherine C Harrill, LCSW

## 2014-10-01 MED ORDER — CLONAZEPAM 0.5 MG PO TABS
0.5000 mg | ORAL_TABLET | Freq: Every day | ORAL | Status: DC
  Administered 2014-10-02 – 2014-10-03 (×2): 0.5 mg via ORAL
  Filled 2014-10-01 (×2): qty 1

## 2014-10-01 NOTE — Progress Notes (Signed)
Patient ID: Diane Richardson, female   DOB: 09/17/1950, [age] y.o.   MRN: [id number]  D: Pt continues to be very disorganized on the unit, she continues to also be very paranoid. Pt still thinks that people are stealing her things and trying to get her. Pt has continued to require frequent redirection, due to her being very confused. Pt reported that her depression was a 7, and that her helplessness was a 7.  Pt reported being negative SI/HI, no AH/VH noted. A: 15 min checks continued for patient safety. R: Pt safety maintained.

## 2014-10-01 NOTE — [facility] Group Notes (Signed)
[facility]  10/01/2014 1:15 PM   Type of Therapy:  Group Therapy  Participation Level:  Did Not Attend  Diane IvanChelsea Horton, LCSW 10/01/2014 3:18 PM

## 2014-10-01 NOTE — Progress Notes (Addendum)
[facility] MD Progress Note  10/01/2014 12:01 PM Diane Richardson  MRN:  [id number]   Subjective:  I am very sleepy.  I want to go home.  Objective: Patient seen chart reviewed.  Patient remains very confused and labile.  However she appears sedated.  She requires some time redirection.  She was given Klonopin, trazodone, Depakote at bedtime for sleep.  She continued to endorse paranoia and admitted having crying spells and tearfulness.  She is accusing her husband for inpatient treatment.  Patient remains very labile and she wants to go home so she can enjoy horse riding.  Patient denies any side effects and she is compliant with the medication.  She has limited interaction with other patients and she minimally involved in the groups.  She continues to have flight of ideas and manic symptoms however she is not aggressive or violent.  Principal Problem: Bipolar affective disorder, current episode hypomanic Diagnosis:   Patient Active Problem List   Diagnosis Date Noted  . Bipolar affective disorder, current episode hypomanic [F31.0] 09/29/2014  . Suicidal ideations [R45.851] 09/29/2014  . Depression [F32.9] 09/29/2014  . Hallucination [R44.3]   . Frequent falls [R29.6] 08/29/2014  . ADD (attention deficit disorder) [F90.9] 08/29/2014  . Anxiety [F41.9] 08/29/2014  . H/O deep venous thrombosis [Z86.718] 08/29/2014  . Healed or old pulmonary embolism [Z86.711] 08/29/2014  . 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 General psychoses [F29] 07/24/2014  . Paranoia [F22]   . Subacute confusional state [F05] 02/07/2013   Total Time spent with patient: 30 minutes   Past Medical History:  Past Medical History  Diagnosis Date  . Fibromyalgia   . PTSD (post-traumatic stress disorder)   . Anxiety   . Depression   . DVT (deep venous thrombosis)   . PE (pulmonary embolism)   . ADD (attention deficit disorder)   . Muscle spasm     bad  . Fatigue     chronic    Past Surgical History   Procedure Laterality Date  . Childbirth  1989    x1,NVD  . Pulmonary embolism surgery  1983    x9 days   Family History:  Family History  Problem Relation Age of Onset  . CAD Father   . Hyperlipidemia Father   . Hypertension Father   . Hyperlipidemia Brother   . Diabetes Maternal Grandmother   . Cancer Maternal Grandmother     stomach  . Dementia Maternal Grandfather   . Colon polyps Maternal Grandfather   . CAD Paternal Grandmother   . Diabetes Paternal Grandmother   . Dementia Paternal Grandfather    Social History:  History  Alcohol Use  . Yes    Comment: 3 glasses of wine weekly     History  Drug Use No    History   Social History  . Marital Status: Married    Spouse Name: N/A    Number of Children: 1  . Years of Education: PHD   Occupational History  . disabled     FORMER TEACHER   Social History Main Topics  . Smoking status: Never Smoker   . Smokeless tobacco: Never Used  . Alcohol Use: Yes     Comment: 3 glasses of wine weekly  . Drug Use: No  . Sexual Activity: Yes   Other Topics Concern  . None   Social History Narrative   Additional History:    Sleep: Fair  Appetite:  Fair   Assessment:   Musculoskeletal: Strength & Muscle Tone:  within normal limits Gait & Station: normal Patient leans: N/A   Psychiatric Specialty Exam: Physical Exam  Review of Systems  Psychiatric/Behavioral: Positive for hallucinations. The patient is nervous/anxious.        Paranoia and delusions.    Blood pressure 92/54, pulse 68, temperature 97.8 F (36.6 C), temperature source Oral, resp. rate 18, height 5\' 1"  (1.549 m), weight 55.792 kg (123 lb), SpO2 100 %.Body mass index is 23.25 kg/(m^2).  General Appearance: Casual  Eye Contact::  Minimal  Speech:  Slow  Volume:  Decreased  Mood:  Angry and Irritable  Affect:  Labile  Thought Process:  Circumstantial, Irrelevant and Loose  Orientation:  Full (Time, Place, and Person)  Thought Content:   Delusions, Hallucinations: Auditory, Paranoid Ideation and Confusion  Suicidal Thoughts:  No  Homicidal Thoughts:  No  Memory:  Immediate;   Poor Recent;   Fair Remote;   Poor  Judgement:  Impaired  Insight:  Lacking  Psychomotor Activity:  Decreased  Concentration:  Fair  Recall:  Fis[employer]Fair  Fund of Knowledge:Poor  Language: Fair  Akathisia:  No  Handed:  Right  AIMS (if indicated):     Assets:  Housing Social Support  ADL's:  Intact  Cognition: WNL  Sleep:  Number of Hours: 6.75     Current Medications: Current Facility-Administered Medications  Medication Dose Route Frequency Provider Last Rate Last Dose  . acetaminophen (TYLENOL) tablet 650 mg  650 mg Oral Q6H PRN Lindwood QuaSheila May Agustin, NP      . alum & mag hydroxide-simeth (MAALOX/MYLANTA) 200-200-20 MG/5ML suspension 30 mL  30 mL Oral Q4H PRN Lindwood QuaSheila May Agustin, NP      . ARIPiprazole (ABILIFY) tablet 30 mg  30 mg Oral Daily Kristeen MansFran E Hobson, NP   30 mg at 10/01/14 0826  . benztropine (COGENTIN) tablet 1 mg  1 mg Oral QHS Kristeen MansFran E Hobson, NP   1 mg at 09/30/14 2114  . clonazePAM (KLONOPIN) tablet 1 mg  1 mg Oral Daily Kristeen MansFran E Hobson, NP   1 mg at 10/01/14 [id number]  . divalproex (DEPAKOTE ER) 24 hr tablet 500 mg  500 mg Oral QHS Kristeen MansFran E Hobson, NP   500 mg at 09/30/14 2114  . ibuprofen (ADVIL,MOTRIN) tablet 600 mg  600 mg Oral Q6H PRN Kristeen MansFran E Hobson, NP   600 mg at 09/30/14 2114  . magnesium hydroxide (MILK OF MAGNESIA) suspension 30 mL  30 mL Oral Daily PRN Velna HatchetSheila May Agustin, NP      . traZODone (DESYREL) tablet 50 mg  50 mg Oral QHS PRN Velna HatchetSheila May Agustin, NP   50 mg at 09/29/14 2138    Lab Results:  Results for orders placed or performed during the hospital encounter of 09/29/14 (from the past 48 hour(s))  TSH     Status: None   Collection Time: 09/29/14  7:30 PM  Result Value Ref Range   TSH 2.710 0.350 - 4.500 uIU/mL    Comment: Performed at [facility]    Physical Findings: AIMS: Facial and Oral Movements Muscles of  Facial Expression: None, normal Lips and Perioral Area: None, normal Jaw: None, normal Tongue: None, normal,Extremity Movements Upper (arms, wrists, hands, fingers): None, normal Lower (legs, knees, ankles, toes): None, normal, Trunk Movements Neck, shoulders, hips: None, normal, Overall Severity Severity of abnormal movements (highest score from questions above): None, normal Incapacitation due to abnormal movements: None, normal Patient's awareness of abnormal movements (rate only patient's report): No Awareness, Dental Status Current problems  with teeth and/or dentures?: No Does patient usually wear dentures?: No  CIWA:  CIWA-Ar Total: 2 COWS:  COWS Total Score: 0  Treatment Plan Summary: Daily contact with patient to assess and evaluate symptoms and progress in treatment, Medication management and Plan See below. Medication management to reduce symptoms to baseline and improved the patient's overall level of functioning.  Closely monitor the side effects, efficacy and therapeutic response of medication. Continue Abilify 30 mg daily, continue Cogentin 1 mg at bedtime, continue Depakote 500 mg at bedtime.  Reduce Klonopin to 0.5 mg at bedtime and use trazodone only as needed for insomnia.  Discussed medication side effects and benefits. Treat health problem as indicated. Developed treatment plan to decrease the risk of relapse upon discharge and to reduce the need for readmission. Psychosocial education regarding relapse prevention in self-care. Healthcare followup as needed for medical problems and called consults as indicated.   Increase collateral information. Restart home medication where appropriate Encouraged to participate and verbalize into group milieu therapy.    Medical Decision Making:  Review of Psycho-Social Stressors (1), Review or order clinical lab tests (1), Review and summation of old records (2), Review of Last Therapy Session (1), Review of Medication Regimen &  Side Effects (2) and Review of New Medication or Change in Dosage (2) Problem Points:  Established problem, stable/improving (1), New problem, with no additional work-up planned (3), Review of last therapy session (1) and Review of psycho-social stressors (1) Data Points:  Review or order clinical lab tests (1) Review or order medicine tests (1) Review and summation of old records (2) Review of medication regiment & side effects (2) Review of new medications or change in dosage (2)    Espn Zeman T. 10/01/2014, 12:01 PM
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
## 2014-10-01 NOTE — Plan of Care (Signed)
Problem: Diagnosis: Increased Risk For Suicide Attempt Goal: STG-Patient Will Comply With Medication Regime Outcome: Progressing Pt has been compliant with scheduled medications this shift.       

## 2014-10-01 NOTE — Progress Notes (Signed)
Did not attended group 

## 2014-10-01 NOTE — Progress Notes (Signed)
D: Pt has depressed affect and depressed, anxious mood.  Pt reports "I don't want to be here and I don't think I need to be here.  I'm frustrated."  Pt reports "my husband and daughter took me to [facility] and no one contacted me about what I felt."  Pt reports "I was working in a network of people when I realized how broad the mischief was."  Pt stated "things were appearing to me in my window.  I was afraid someone might walk in."  Pt denies hallucinations at this time.  Pt reports passive SI without a plan.  She verbally contracts for safety.  Pt denies HI.  Pt's daughter and husband visited her this evening.  Pt did not attend evening group.  Pt is confused and requires redirection at times.   A: Medications administered per order.  PRN medication administered for pain, see flowsheet.  Safety maintained.  Actively listened to pt.  Supported and encouraged pt.   R: Pt is compliant with scheduled medications.  She verbally contracts for safety and reports that she will notify staff of needs and concerns.  Will continue to monitor and assess for safety.

## 2014-10-02 MED ORDER — BACITRACIN-NEOMYCIN-POLYMYXIN OINTMENT TUBE
TOPICAL_OINTMENT | Freq: Three times a day (TID) | CUTANEOUS | Status: DC | PRN
  Administered 2014-10-03 – 2014-10-05 (×3): via TOPICAL
  Filled 2014-10-02: qty 15

## 2014-10-02 MED ORDER — ARIPIPRAZOLE 10 MG PO TABS
20.0000 mg | ORAL_TABLET | Freq: Every day | ORAL | Status: DC
  Administered 2014-10-03: 20 mg via ORAL
  Filled 2014-10-02 (×4): qty 2

## 2014-10-02 NOTE — [facility] Group Notes (Signed)
[facility]   10/02/2014 9:39 AM  Participation Quality:  Appropriate   Mood/Affect:  Flat  Depression Rating:  8  Anxiety Rating:  8  Thoughts of Suicide:  No Will you contract for safety?   NA  Current AVH:  No  Plan for Discharge/Comments:  Pt reports that she lives at home with her husband and daughter. Pt wanting to be d/ced asap, providing minimal info about reason for admission. Pt reports that her husband "made me come." Pt reports good sleep and would like follow-up continued with Dr. Evelene CroonKaur for med management and [name] for therapy. CSW assessing.   Transportation Means: walk/family   Supports: pt reports minimal family supports   Counselling psychologistmart, OncologistHeather LCSWA

## 2014-10-02 NOTE — Progress Notes (Signed)
Patient ID: [name], female   DOB: 12/16/1950, 64 y.o.   MRN: [id number] D: Client visible on the unit, visiting with husband earlier this shift, then in dayroom. Client still suspicious, tangential, anxious. Client reports "I have some concerns about the guy in the stripped shirt he was on the phone, then putting his hands behind his back suddenly, he maybe a drug dealer" Client c/o tight ring on right ring finger, client preoccupied with getting ring off, using neosporin. Client reports day been "bad in a way" "couple of friends passed away, been thinking about it" A: Clinical research associateWriter provided emotional support, assured client she was safe here, also encouraged client to stop twisting ring and consider taking a warm shower it may slip off. Staff will monitor q3515min for safety. R: client is safe on the unit, attended group.

## 2014-10-02 NOTE — Progress Notes (Signed)
D)   Has been disorganized and confused this evening, soft spoken, unable to make a complete sentence.  Went to her room and got into bed but was watching the floor and talking to herself.  Medications were taken to her and she made a comment when handed her water, that the girls had been pouring dirt from their boots, then said water from a boot. When asked different questions, her answers are unrelated to the subject. Has been out on the hall briefly this evening, seems to be wandering aimlessly, gait was unsteady, appears frail, affect flat, sad, depressed mood. A)  Will continue to monitor and maintain safety, continue POC, reorient to time place R)  Safety maintained

## 2014-10-02 NOTE — Tx Team (Signed)
Interdisciplinary Treatment Plan Update (Adult)   Date: 10/02/2014  Time Reviewed:9:44 AM  Progress in Treatment:  Attending groups: Yes Participating in groups: Yes  Taking medication as prescribed: Yes  Tolerating medication: Yes  Family/Significant othe contact made: Not yet. SPE required for pt.   Patient understands diagnosis: Minimal insight-pt disclosing little about reason for admission "My husband made me come." Pt admitted due to paranoia, tangential thought process, Passive SI, and for med stabilization.  Discussing patient identified problems/goals with staff: Yes  Medical problems stabilized or resolved: Yes  Denies suicidal/homicidal ideation: Yes during group/self report.  Patient has not harmed self or Others: Yes  New problem(s) identified:  Discharge Plan or Barriers: Pt plans to return home/[facility] Dr. Evelene CroonKaur for med management and [name] for therapy. CSW assessing.  Additional comments:  Diane Richardson is a 64 y.o. female patient admitted with Paranoia and suicide threat. She presented to [facility] tangential and hypomanic upset with husband and daughter per ED report. Patient came in stating she was feeling bad and would hurt self if she was not able to her house. [facility] Patient was disorganized and challenging to follow her thought patterns. She mentioned a weekly "pill box" and that my daughter had messed up filling them" The patient is difficult to get to answer questions directly. She states that she has suffered multiple injuries on her horse, including hitting her head. The patient has not been taking her medications.She appears somnolent. She is not talkative and hard to get answers to questions directly. She is not exhibiting disruptive behaviors on the unit. She is adherent to medication regimen.  Reason for Continuation of Hospitalization: Mood stabilization Disorganized thought process;  delusions, paranoid ideation Medication stabilization Estimated length of stay: 3-5 days  For review of initial/current patient goals, please see plan of care.  Attendees:  Patient:    Family:    Physician: Dr. Jama Flavorsobos MD  10/02/2014 9:45 AM   Nursing: Meriam SpragueBeverly RN 10/02/2014 9:45 AM   Clinical Social Worker Charnell Peplinski Smart, LCSWA  10/02/2014 9:45 AM   Other: Alonna MiniumQuylle H LCSW; Kristin D. LCSWA 10/02/2014 9:45 AM   Other: Vikki PortsValerie Care Coordinator 10/02/2014 9:46 AM   Other:    Other:    Scribe for Treatment Team:  The Sherwin-WilliamsHeather Smart LCSWA 10/02/2014 9:48 AM

## 2014-10-02 NOTE — Progress Notes (Signed)
Patient's right hand ring finger is red/swollen.  Stated she has been wearing several rings approximately 3 months without taking rings off.  Patient given ice pack, cup with ice water to soak her finger, vaseline to help ease ring off finger.  Patient stated she has been reluctant to remove these rings because her husband had given her these rings.   MD order for neosporin tid prn.
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
## 2014-10-02 NOTE — Clinical Social Work Note (Signed)
CSW met with pt and MD. Pt continues to minimize memory issues but did acknowledge that she had been experiencing some memory issues prior to falling off her horses. 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 Pt shared that conflict with her daughter and husband has been triggering an increase in depression and hopelessness. "My daughter has not been an adult long enough to treat me this way. I'm not even allowed to take my own medications." Pt reports minimal support from husband but is open to [facility] calling him to schedule family session. Pt does not want her daughter present at family session. Pt also wants to d/c today and return tomorrow for family session. CSW explained that pt cannot d/c today and will discuss possibility of d/c tomorrow during family session. Pt open to returning to [facility]. [facility] for med management and to [facility]) for therapy. Pt also open to doctors's recommendation of med changes (stopping benzo use) due to memory issues. Pt struggled to answer some questions during meeting with MD and CSW, and was given memory test by MD.   Diane Richardson, [place] 10/02/2014 3:56 PM   CSW contacted pt's husband in attempt to schedule family session with pt, doctor, and CSW. Diane Richardson (husband) [phone]. [name] can come for family session at 2:30PM on Wednesday, 10/04/14.   [place], [place] 10/02/2014 4:04 PM

## 2014-10-02 NOTE — Progress Notes (Addendum)
Patient ID: Diane Richardson, female   DOB: May 21, 1951, 64 y.o.   MRN: [id number] [facility] MD Progress Note  10/02/2014 2:34 PM Diane Richardson  MRN:  [id number]   Subjective:  Patient reports a subjective sense of some improvement, but continues to report she feels labile, sad, and upset with her husband and daughter " for bringing me here"   Objective: I have discussed case with treatment team, and have met with patient. 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 She is a fair historian and has some difficulty in describing events that have led to admission. She does report history of head trauma a few weeks ago after falling off one of her horses, but states she had a head MRI which was negative. She also endorses some subjective sense of gradual cognitive decline, even prior to trauma noted above. As per chart information patient had deteriorated over recent weeks, with impaired memory and paranoia, including persecutory delusions.  Presents depressed, mildly anxious, ruminative about her family members " bringing me here". She is alert, attentive, and is partially oriented to time ( " first month, 2016, winter", and to place " [facility]". She is fully oriented to person). She states she has had some passive thoughts of " wanting to die", but denies any actual plan or intention of hurting self at this time and contracts for safety on the unit. Denies hallucinations and does not present internally preoccupied at this time. Although angry at her family for bringing her to hospital, and insight into illness seems limited at this time, she is not currently voicing any clear delusional ideations.  Denies medication side effects at present . No disruptive behaviors on unit.  Principal Problem: Bipolar affective disorder, current episode hypomanic Diagnosis:   Patient Active Problem List   Diagnosis Date Noted  . Bipolar affective disorder, current episode hypomanic [F31.0] 09/29/2014  . Suicidal ideations [R45.851] 09/29/2014  . Depression [F32.9] 09/29/2014  . Hallucination [R44.3]   . Frequent falls [R29.6] 08/29/2014  . ADD (attention deficit disorder) [F90.9] 08/29/2014  . Anxiety [F41.9] 08/29/2014  . H/O deep venous thrombosis [Z86.718] 08/29/2014  . Healed or old pulmonary embolism [Z86.711] 08/29/2014  . 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 General psychoses [F29] 07/24/2014  . Paranoia [F22]   . Subacute confusional state [F05] 02/07/2013   Total Time spent with patient: 30 minutes   Past Medical History:  Past Medical History  Diagnosis Date  . Fibromyalgia   . PTSD (post-traumatic stress disorder)   . Anxiety   . Depression   . DVT (deep venous thrombosis)   . PE (pulmonary embolism)   . ADD (attention deficit disorder)   . Muscle spasm     bad  . Fatigue     chronic    Past Surgical History  Procedure Laterality Date  . Childbirth  1989    x1,NVD  . Pulmonary embolism surgery  1983    x9 days   Family History:  Family History  Problem Relation Age of Onset  . CAD Father   . Hyperlipidemia Father   . Hypertension Father   . Hyperlipidemia Brother   . Diabetes Maternal Grandmother   . Cancer Maternal Grandmother     stomach  . Dementia Maternal Grandfather   . Colon polyps Maternal Grandfather   . CAD Paternal Grandmother   . Diabetes Paternal Grandmother   .  Dementia Paternal Grandfather    Social History:  History  Alcohol Use  . Yes    Comment: 3 glasses of wine weekly     History  Drug Use No    History   Social History  . Marital Status: Married    Spouse Name: N/A    Number of Children: 1  . Years of Education: PHD   Occupational History  . disabled     FORMER TEACHER   Social History Main Topics  . Smoking status: Never Smoker   . Smokeless tobacco: Never Used  . Alcohol Use: Yes     Comment: 3  glasses of wine weekly  . Drug Use: No  . Sexual Activity: Yes   Other Topics Concern  . None   Social History Narrative   Additional History:    Sleep: improved   Appetite:  Fair   Assessment:   Musculoskeletal: Strength & Muscle Tone: within normal limits Gait & Station: normal Patient leans: N/A   Psychiatric Specialty Exam: Physical Exam  ROS  Blood pressure 113/59, pulse 66, temperature 97.7 F (36.5 C), temperature source Oral, resp. rate 18, height _0  (1.549 m), weight 123 lb (55.792 kg), SpO2 100 %.Body mass index is 23.25 kg/(m^2).  General Appearance: Casual  Eye Contact::  Fair  Speech:  Slow, but seems to be improving compared to admission  Volume:  Decreased  Mood:  Depressed and vaguely irritable  Affect:  Labile, sad   Thought Process:  Linear and but slowed   Orientation:  Full (Time, Place, and Person)  Thought Content:  at this time patient denies hallucinations and does not appear internally preoccupied, seems to blame family for bringing her to hospital but does not express any clear delusional thoughts   Suicidal Thoughts:  No denies any plan or intention of hurting self or anyone else at this time.  Homicidal Thoughts:  No  Memory:3/3 immediate, 1/3 at 3 minutes. Able to perform 5 serial subtractions without much difficulty  Judgement:  Poor  Insight:  Lacking  Psychomotor Activity:  Decreased  Concentration:  Fair  Recall:  [employer] of Knowledge:Poor  Language: Fair  Akathisia:  No  Handed:  Right  AIMS (if indicated):     Assets:  Housing Social Support  ADL's:  Intact  Cognition: WNL and Impaired,  Moderate  Sleep:  Number of Hours: 6.75     Current Medications: Current Facility-Administered Medications  Medication Dose Route Frequency Provider Last Rate Last Dose  . acetaminophen (TYLENOL) tablet 650 mg  650 mg Oral Q6H PRN Janett Labella, NP      . alum & mag hydroxide-simeth (MAALOX/MYLANTA) 200-200-20 MG/5ML  suspension 30 mL  30 mL Oral Q4H PRN Janett Labella, NP      . ARIPiprazole (ABILIFY) tablet 30 mg  30 mg Oral Daily Lurena Nida, NP   30 mg at 10/02/14 0756  . benztropine (COGENTIN) tablet 1 mg  1 mg Oral QHS Lurena Nida, NP   1 mg at 10/01/14 2159  . clonazePAM (KLONOPIN) tablet 0.5 mg  0.5 mg Oral Daily Kathlee Nations, MD   0.5 mg at 10/02/14 0756  . divalproex (DEPAKOTE ER) 24 hr tablet 500 mg  500 mg Oral QHS Lurena Nida, NP   500 mg at 10/01/14 2159  . ibuprofen (ADVIL,MOTRIN) tablet 600 mg  600 mg Oral Q6H PRN Lurena Nida, NP   600 mg at 09/30/14 2114  . magnesium  hydroxide (MILK OF MAGNESIA) suspension 30 mL  30 mL Oral Daily PRN Freda Munro May Agustin, NP      . traZODone (DESYREL) tablet 50 mg  50 mg Oral QHS PRN Janett Labella, NP   50 mg at 09/29/14 2138    Lab Results:  No results found for this or any previous visit (from the past 48 hour(s)).  Physical Findings: AIMS: Facial and Oral Movements Muscles of Facial Expression: None, normal Lips and Perioral Area: None, normal Jaw: None, normal Tongue: None, normal,Extremity Movements Upper (arms, wrists, hands, fingers): None, normal Lower (legs, knees, ankles, toes): None, normal, Trunk Movements Neck, shoulders, hips: None, normal, Overall Severity Severity of abnormal movements (highest score from questions above): None, normal Incapacitation due to abnormal movements: None, normal Patient's awareness of abnormal movements (rate only patient's report): No Awareness, Dental Status Current problems with teeth and/or dentures?: No Does patient usually wear dentures?: No  CIWA:  CIWA-Ar Total: 2 COWS:  COWS Total Score: 2   Assessment: Patient remains depressed, constricted in affect, ruminative, angry with family.  Not overtly psychotic at this time. Denies any SI or HI at this time and behavior on unit in good control. No overt delirium at this time and appears alert and attentive throughout session but does  report cognitive decline and has some lingering confusion and slowed mentation.  She denies medication side effects.  Treatment Plan Summary: Daily contact with patient to assess and evaluate symptoms and progress in treatment, Medication management and Plan See below. With patient's express consent would like to arrange for a family meeting with husband and family to review history /family input. Decrease  Abilify to 20 mgrs QDAY Continue Klonopin 0.5 mgrs QDAY but would consider tapering off BZD due to potential  Contribution to cognitive symptoms  D/C Cogentin Continue Depakote ER 500 mgrs QHS at this time.  Will request B12, Folate, RPR to rule out possible reversible contributors to cognitive issues/decline.     Medical Decision Making:  Review of Psycho-Social Stressors (1), Review or order clinical lab tests (1), Review and summation of old records (2), Review of Last Therapy Session (1), Review of Medication Regimen & Side Effects (2) and Review of New Medication or Change in Dosage (2) Problem Points:  Established problem, stable/improving (1), New problem, with no additional work-up planned (3), Review of last therapy session (1) and Review of psycho-social stressors (1) Data Points:  Review or order clinical lab tests (1) Review or order medicine tests (1) Review and summation of old records (2) Review of medication regiment & side effects (2) Review of new medications or change in dosage (2)    COBOS, FERNANDO 10/02/2014, 2:34 PM

## 2014-10-02 NOTE — [facility] Suicide Risk Assessment (Signed)
[facility] INPATIENT:  Family/Significant Other Suicide Prevention Education  Suicide Prevention Education:  Education Completed; Dorice LamasMike Fowles (pt's husband) [phone] has been identified by the patient as the family member/significant other with whom the patient will be residing, and identified as the person(s) who will aid the patient in the event of a mental health crisis (suicidal ideations/suicide attempt).  With written consent from the patient, the family member/significant other has been provided the following suicide prevention education, prior to the and/or following the discharge of the patient.  The suicide prevention education provided includes the following:  Suicide risk factors  Suicide prevention and interventions  National Suicide Hotline telephone number  [facility] telephone number  [facility] Emergency Assistance 911  [facility] and/or Residential Mobile Crisis Unit telephone number  Request made of family/significant other to:  Remove weapons (e.g., guns, rifles, knives), all items previously/currently identified as safety concern.    Remove drugs/medications (over-the-counter, prescriptions, illicit drugs), all items previously/currently identified as a safety concern.  The family member/significant other verbalizes understanding of the suicide prevention education information provided.  The family member/significant other agrees to remove the items of safety concern listed above.  Smart, Lindsy Cerullo LCSWA 10/02/2014, 4:05 PM

## 2014-10-02 NOTE — [facility] Group Notes (Signed)
[facility] LCSW Group Therapy  10/02/2014 2:53 PM  Type of Therapy:  Group Therapy  Participation Level:  Active  Participation Quality:  Attentive  Affect:  Blunted  Cognitive:  Alert and Oriented  Insight:  Limited  Engagement in Therapy:  Improving  Modes of Intervention:  Confrontation, Discussion, Education, Exploration, Problem-solving, Rapport Building, Socialization and Support  Summary of Progress/Problems: Today's Topic: Overcoming Obstacles. Pt identified obstacles faced currently and processed barriers involved in overcoming these obstacles. Pt identified steps necessary for overcoming these obstacles and explored motivation (internal and external) for facing these difficulties head on. Pt further identified one area of concern in their lives and chose a skill of focus pulled from their "toolbox." Diane Richardson was attentive and engaged during today's processing group. She shared that her biggest obstacle involves "family relationships with my husband and daughter." Pt reports that they are not being respectful of her and don't trust her to take meds, thinking she has memory issues. Pt minimizes memory problems but does acknowledge that they exist. Pt actively participated in group discussion and helped another pt problem solve regarding reaching out for support in the community.      Smart, Diane Richardson LCSWA 10/02/2014, 2:53 PM

## 2014-10-02 NOTE — Plan of Care (Signed)
Problem: Diagnosis: Increased Risk For Suicide Attempt Goal: LTG-Patient Will Show Positive Response to Medication LTG (by discharge) : Patient will show positive response to medication and will participate in the development of the discharge plan.  Outcome: Completed/Met Date Met:  10/02/14 Nurse discussed suicide information with patient.

## 2014-10-03 LAB — FOLATE: Folate: >20 ng/mL

## 2014-10-03 LAB — VITAMIN B12: VITAMIN B 12: 624 pg/mL (ref 211–911)

## 2014-10-03 MED ORDER — ARIPIPRAZOLE 15 MG PO TABS
15.0000 mg | ORAL_TABLET | Freq: Every day | ORAL | Status: DC
  Administered 2014-10-04 – 2014-10-06 (×3): 15 mg via ORAL
  Filled 2014-10-03 (×5): qty 1

## 2014-10-03 MED ORDER — HYDROXYZINE HCL 25 MG PO TABS
25.0000 mg | ORAL_TABLET | Freq: Once | ORAL | Status: AC
Start: 2014-10-03 — End: 2014-10-03
  Administered 2014-10-03: 25 mg via ORAL

## 2014-10-03 MED ORDER — DIVALPROEX SODIUM ER 250 MG PO TB24
250.0000 mg | ORAL_TABLET | Freq: Every day | ORAL | Status: DC
  Administered 2014-10-03 – 2014-10-08 (×6): 250 mg via ORAL
  Filled 2014-10-03 (×9): qty 1

## 2014-10-03 MED ORDER — HYDROXYZINE HCL 25 MG PO TABS
ORAL_TABLET | ORAL | Status: AC
  Filled 2014-10-03: qty 1

## 2014-10-03 MED ORDER — CLONAZEPAM 0.5 MG PO TABS
0.2500 mg | ORAL_TABLET | Freq: Every day | ORAL | Status: DC
  Administered 2014-10-04 – 2014-10-05 (×2): 0.25 mg via ORAL
  Filled 2014-10-03 (×2): qty 1

## 2014-10-03 NOTE — Progress Notes (Signed)
Patient ID: Diane Richardson, female   DOB: August 19, 1951, 65 y.o.   MRN: [id number] [facility] MD Progress Note  10/03/2014 5:38 PM Diane Richardson  MRN:  [id number]   Subjective:  Patient reports she feels " about the same"  Objective: I have discussed case with treatment team, and have met with patient. No disruptive behaviors on unit. Presents as superficially pleasant, cooperative, but tends to feel confused easily. She does know she is in a psychiatric hospital, although today states it is in " [place]". Aware of month, year, season, but unable to provide day of week or date. Oriented to person. No evidence of fluctuating sensorium, presentation not currently suggestive of delirium. With patient's express consent I have spoken with patient's adult daughter and husband . She has , as per family, improved somewhat since her admission, with less overt paranoia and anger. As per family, patient may have had a history of some depression but in general had been high functioning without psychiatric issues until about one year ago. There has been some gradual but noticeable cognitive decline, emergence of paranoia, recently with overt delusions of being pursued by political figures and daughter being in danger of being murdered. She has had at least two documented head traumas by falling off horse, most recently in November 2014, at which time a Head CT scan was negative for acute changes. She had been started on psychiatric medications due to paranoia and onset of mood episodes/anger outbursts, which are new and coincide with cognitive decline. 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 Folate, B12, TSH within normal limits  Principal Problem: Cognitive Decline with psychotic symptoms/ Mood outbursts.  Diagnosis:   Patient Active Problem List   Diagnosis Date Noted  . Bipolar affective disorder, current episode hypomanic [F31.0]  09/29/2014  . Suicidal ideations [R45.851] 09/29/2014  . Depression [F32.9] 09/29/2014  . Hallucination [R44.3]   . Frequent falls [R29.6] 08/29/2014  . ADD (attention deficit disorder) [F90.9] 08/29/2014  . Anxiety [F41.9] 08/29/2014  . H/O deep venous thrombosis [Z86.718] 08/29/2014  . Healed or old pulmonary embolism [Z86.711] 08/29/2014  . 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 General psychoses [F29] 07/24/2014  . Paranoia [F22]   . Subacute confusional state [F05] 02/07/2013   Total Time spent with patient: 30 minutes   Past Medical History:  Past Medical History  Diagnosis Date  . Fibromyalgia   . PTSD (post-traumatic stress disorder)   . Anxiety   . Depression   . DVT (deep venous thrombosis)   . PE (pulmonary embolism)   . ADD (attention deficit disorder)   . Muscle spasm     bad  . Fatigue     chronic    Past Surgical History  Procedure Laterality Date  . Childbirth  1989    x1,NVD  . Pulmonary embolism surgery  1983    x9 days   Family History:  Family History  Problem Relation Age of Onset  . CAD Father   . Hyperlipidemia Father   . Hypertension Father   . Hyperlipidemia Brother   . Diabetes Maternal Grandmother   . Cancer Maternal Grandmother     stomach  . Dementia Maternal Grandfather   . Colon polyps Maternal Grandfather   . CAD Paternal Grandmother   . Diabetes Paternal Grandmother   . Dementia Paternal Grandfather    Social History:  History  Alcohol Use  . Yes    Comment: 3 glasses of  wine weekly     History  Drug Use No    History   Social History  . Marital Status: Married    Spouse Name: N/A    Number of Children: 1  . Years of Education: PHD   Occupational History  . disabled     FORMER TEACHER   Social History Main Topics  . Smoking status: Never Smoker   . Smokeless tobacco: Never Used  . Alcohol Use: Yes     Comment: 3 glasses of wine weekly  . Drug Use: No  . Sexual Activity: Yes   Other Topics  Concern  . None   Social History Narrative   Additional History:    Sleep:fair  Appetite:  Fair   Assessment:   Musculoskeletal: Strength & Muscle Tone: within normal limits Gait & Station: normal Patient leans: N/A   Psychiatric Specialty Exam: Physical Exam  Review of Systems  Constitutional: Negative for fever and chills.  Eyes: Negative for blurred vision.  Respiratory: Positive for cough. Negative for shortness of breath.   Cardiovascular: Negative for chest pain.  Gastrointestinal: Negative for vomiting.  Genitourinary: Negative for dysuria, urgency and frequency.  Skin: Negative for rash.  Neurological: Negative.  Negative for headaches.       Cognitive decline      Blood pressure 109/69, pulse 76, temperature 97.6 F (36.4 C), temperature source Oral, resp. rate 18, height 5' 1"  (1.549 m), weight 123 lb (55.792 kg), SpO2 100 %.Body mass index is 23.25 kg/(m^2).  General Appearance: Casual  Eye Contact::  Fair  Speech:  Normal Rate improving compared to admission  Volume:  Decreased  Mood:  seems less depressed  Affect:  more reactive,    Thought Process:  Linear and but slowed   Orientation:  Full (Time, Place, and Person)  Thought Content:  denies hallucinations, not voicing any clear delusions at this time  Suicidal Thoughts:  No denies any plan or intention of hurting self or anyone else at this time.  Homicidal Thoughts:  No  Memory:3/3 immediate, 1/3 at 3 minutes. Able to perform 5 serial subtractions without much difficulty  Judgement:  Poor  Insight:  Lacking  Psychomotor Activity:  Decreased  Concentration:  Fair  Recall:  [employer] of Knowledge:Poor  Language: Fair  Akathisia:  No  Handed:  Right  AIMS (if indicated):     Assets:  Housing Social Support  ADL's:  Intact  Cognition: WNL and Impaired,  Moderate  Sleep:  Number of Hours: 5     Current Medications: Current Facility-Administered Medications  Medication Dose Route  Frequency Provider Last Rate Last Dose  . acetaminophen (TYLENOL) tablet 650 mg  650 mg Oral Q6H PRN Janett Labella, NP      . alum & mag hydroxide-simeth (MAALOX/MYLANTA) 200-200-20 MG/5ML suspension 30 mL  30 mL Oral Q4H PRN Janett Labella, NP      . ARIPiprazole (ABILIFY) tablet 20 mg  20 mg Oral Daily Jenne Campus, MD   20 mg at 10/03/14 0741  . clonazePAM (KLONOPIN) tablet 0.5 mg  0.5 mg Oral Daily Kathlee Nations, MD   0.5 mg at 10/03/14 0741  . divalproex (DEPAKOTE ER) 24 hr tablet 500 mg  500 mg Oral QHS Lurena Nida, NP   500 mg at 10/02/14 2116  . ibuprofen (ADVIL,MOTRIN) tablet 600 mg  600 mg Oral Q6H PRN Lurena Nida, NP   600 mg at 09/30/14 2114  . magnesium hydroxide (MILK  OF MAGNESIA) suspension 30 mL  30 mL Oral Daily PRN Freda Munro May Agustin, NP      . neomycin-bacitracin-polymyxin (NEOSPORIN) ointment   Topical TID PRN Nicholaus Bloom, MD      . traZODone (DESYREL) tablet 50 mg  50 mg Oral QHS PRN Freda Munro May Agustin, NP   50 mg at 10/02/14 2116    Lab Results:  Results for orders placed or performed during the hospital encounter of 09/29/14 (from the past 48 hour(s))  Vitamin B12     Status: None   Collection Time: 10/03/14  6:30 AM  Result Value Ref Range   Vitamin B-12 624 211 - 911 pg/mL    Comment: Performed at [facility]  Folate     Status: None   Collection Time: 10/03/14  6:30 AM  Result Value Ref Range   Folate >20.0 ng/mL    Comment: (NOTE) Reference Ranges        Deficient:       0.4 - 3.3 ng/mL        Indeterminate:   3.4 - 5.4 ng/mL        Normal:              > 5.4 ng/mL Performed at [facility]     Physical Findings: AIMS: Facial and Oral Movements Muscles of Facial Expression: None, normal Lips and Perioral Area: None, normal Jaw: None, normal Tongue: None, normal,Extremity Movements Upper (arms, wrists, hands, fingers): None, normal Lower (legs, knees, ankles, toes): None, normal, Trunk Movements Neck, shoulders,  hips: None, normal, Overall Severity Severity of abnormal movements (highest score from questions above): None, normal Incapacitation due to abnormal movements: None, normal Patient's awareness of abnormal movements (rate only patient's report): No Awareness, Dental Status Current problems with teeth and/or dentures?: No Does patient usually wear dentures?: No  CIWA:  CIWA-Ar Total: 1 COWS:  COWS Total Score: 1   Assessment: Mood and affect partially improved, no disruptive behaviors on unit. Has had no rageful/angry outbursts on unit, as  She was experiencing prior to admission as described by family. Remains  Confused but not currently overtly delirious or encephalopathic.  Tolerating medications well. As per family, gradual cognitive decline preceded /coincided with onset of paranoia, delusions, mood lability, and prior to this was essentially high functioning without significant psychiatric history. No history of mania.  Plan at this time is to minimize medication management that could worsen /impair mentation further.    Treatment Plan Summary: Daily contact with patient to assess and evaluate symptoms and progress in treatment, Medication management and Plan See below. Family Meeting scheduled for tomorrow. Decrease  Abilify to 15  mgrs QDAY Decrease  Klonopin to 0.25  mgrs QDAY  Decrease  Depakote ER  To 250 mgrs QHS  Will request Head MRI  ( most recent from 2014)      Medical Decision Making:  Review of Psycho-Social Stressors (1), Review or order clinical lab tests (1), Review and summation of old records (2), Review of Last Therapy Session (1), Review of Medication Regimen & Side Effects (2) and Review of New Medication or Change in Dosage (2) Problem Points:  Established problem, stable/improving (1), New problem, with no additional work-up planned (3), Review of last therapy session (1) and Review of psycho-social stressors (1) Data Points:  Review or order clinical lab  tests (1) Review or order medicine tests (1) Review and summation of old records (2) Review of medication regiment & side effects (2) Review  of new medications or change in dosage (2)    Xzavien Harada 10/03/2014, 5:38 PM

## 2014-10-03 NOTE — Progress Notes (Signed)
Adult Psychoeducational Group Note  Date:  10/03/2014 Time:  8:50 PM  Group Topic/Focus:  Wrap-Up Group:   The focus of this group is to help patients review their daily goal of treatment and discuss progress on daily workbooks.    Participation Quality:  Redirectable  Affect:  Flat  Cognitive:  Confused  Insight: Limited  Engagement in Group:  Off Topic    Additional Comments:  Pt was off topic talking about different scenarios which had nothing to do with her stay at the hospital. This writer redirected patient and patient still spoke off topic.  Diane Richardson, Diane Richardson H 10/03/2014, 8:50 PM

## 2014-10-03 NOTE — Progress Notes (Signed)
[facility] Group Notes:  (Nursing/MHT/Case Management/Adjunct)  Date:  10/03/2014  Time:  1:26 AM  Type of Therapy:  Psychoeducational Skills  Participation Level:  Active  Participation Quality:  Appropriate  Affect:  Blunted  Cognitive:  Disorganized  Insight:  Limited  Engagement in Group:  Lacking  Modes of Intervention:  Education  Summary of Progress/Problems: The patient verbalized in group that she missed her pets who are presently at home, but at the same time, she expressed that she was grateful for being in the hospital. In terms of the theme for the day, her steps to wellness will include an emphasis on avoiding her bad eating habits and trying to get more rest.   Diane Richardson S 10/03/2014, 1:26 AM

## 2014-10-03 NOTE — [facility] Group Notes (Signed)
The focus of this group is to educate the patient on the purpose and policies of crisis stabilization and provide a format to answer questions about their admission.  The group details unit policies and expectations of patients while admitted.  Patient attended 0900 nurse education orientation group this morning.  Patient listened attentively, appropriate affect, alert, appropriate insight and engagement.  Today patient will work on 3 goals for discharge.  
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
## 2014-10-03 NOTE — Progress Notes (Signed)
NURSE IS TO CALL CENTRAL SCHEDULING WED MORNING PHONE [phone] TO SCHEDULE MR MRA HEAD WITHOUT CONTRAST PER MD ORDER FOR THIS PATIENT.    (Per [facility] [phone])

## 2014-10-03 NOTE — [facility] Group Notes (Signed)
[facility] LCSW Group Therapy  10/03/2014 1:21 PM  Type of Therapy:  Group Therapy  Participation Level:  Active  Participation Quality:  Attentive  Affect:  Depressed and Flat  Cognitive:  Lacking  Insight:  Limited  Engagement in Therapy:  Improving  Modes of Intervention:  Discussion, Education, Exploration, Problem-solving, Rapport Building, Socialization and Support  Summary of Progress/Problems: [name] came to talk about his personal journey with substance abuse and addiction. The pt processed ways by which to relate to the speaker. [name] provided handouts and educational information pertaining to groups and services offered by [facility].    Smart, Diane Richardson LCSWA 10/03/2014, 1:21 PM

## 2014-10-03 NOTE — Progress Notes (Signed)
Recreation Therapy Notes  Animal-Assisted Activity/Therapy (AAA/T) Program Checklist/Progress Notes Patient Eligibility Criteria Checklist & Daily Group note for Rec Tx Intervention  Date: 01.26.2016 Time: 2:45pm Location: [address] Dayroom   AAA/T Program Assumption of Risk Form signed by Patient/ or Parent Legal Guardian yes  Patient is free of allergies or sever asthma yes  Patient reports no fear of animals yes  Patient reports no history of cruelty to animals yes  Patient understands his/her participation is voluntary yes  Behavioral Response: Did not attend.   Diane Richardson, LRT/CTRS  Diane Richardson L 10/03/2014 4:39 PM 

## 2014-10-03 NOTE — Progress Notes (Signed)
D:  Patient's self inventory sheet, patient has poor sleep, no sleep medication given.  Fair appetite, low energy level, poor concentration.  Rated depression 5-6, hopeless and anxiety #5.  Withdrawals continue, chilling, cravings, agitation, nausea, irritability.  Denied SI.  Physical problems of pain raw ring finger on right hand.  Physical pain 5-6.  Goal is to make good decisions and get sleep.  Plans to help reduce swelling, functional.  Not sure of discharge plans.  A:  Medications administered per MD orders.  Emotional support and encouragement given patient. R:  Denied SI and HI.  Denied A/V hallucinations.  Safety maintained with 15 minute checks.  Neosporin applied to ring finger R hand, redness decreased.  Patient enjoys talking about her horses and is concerned about their care while she is in the hospital.

## 2014-10-03 NOTE — Plan of Care (Signed)
Problem: Alteration in mood Goal: LTG-Patient reports reduction in suicidal thoughts (Patient reports reduction in suicidal thoughts and is able to verbalize a safety plan for whenever patient is feeling suicidal)  Outcome: Completed/Met Date Met:  10/03/14 Nurse discussed suicidal thoughts with patient.

## 2014-10-04 ENCOUNTER — Ambulatory Visit (HOSPITAL_COMMUNITY)
Admission: RE | Admit: 2014-10-04 | Discharge: 2014-10-04 | Disposition: A | Discharge status: Home / Self Care | Payer: BLUE CROSS/BLUE SHIELD | Source: Ambulatory Visit | Attending: Psychiatry | Admitting: Psychiatry

## 2014-10-04 DIAGNOSIS — G3184 Mild cognitive impairment, so stated: Secondary | ICD-10-CM | POA: Insufficient documentation

## 2014-10-04 DIAGNOSIS — R262 Difficulty in walking, not elsewhere classified: Secondary | ICD-10-CM | POA: Diagnosis not present

## 2014-10-04 DIAGNOSIS — R413 Other amnesia: Secondary | ICD-10-CM | POA: Insufficient documentation

## 2014-10-04 DIAGNOSIS — G319 Degenerative disease of nervous system, unspecified: Secondary | ICD-10-CM | POA: Diagnosis not present

## 2014-10-04 DIAGNOSIS — F329 Major depressive disorder, single episode, unspecified: Secondary | ICD-10-CM | POA: Insufficient documentation

## 2014-10-04 DIAGNOSIS — R110 Nausea: Secondary | ICD-10-CM | POA: Insufficient documentation

## 2014-10-04 DIAGNOSIS — H9190 Unspecified hearing loss, unspecified ear: Secondary | ICD-10-CM | POA: Insufficient documentation

## 2014-10-04 DIAGNOSIS — R410 Disorientation, unspecified: Secondary | ICD-10-CM | POA: Diagnosis not present

## 2014-10-04 LAB — RPR: RPR Ser Ql: NONREACTIVE

## 2014-10-04 MED ORDER — CLONAZEPAM 0.5 MG PO TABS
0.5000 mg | ORAL_TABLET | Freq: Once | ORAL | Status: AC
  Administered 2014-10-04: 0.5 mg via ORAL
  Filled 2014-10-04: qty 1

## 2014-10-04 NOTE — Progress Notes (Signed)
Adult Psychoeducational Group Note  Date:  10/04/2014 Time:  9:37 PM  Group Topic/Focus:  Wrap-Up Group:   The focus of this group is to help patients review their daily goal of treatment and discuss progress on daily workbooks.  Participation Level:  Active  Participation Quality:  Appropriate  Affect:  Appropriate  Cognitive:  Appropriate  Insight: Appropriate  Engagement in Group:  Engaged  Modes of Intervention:  Discussion  Additional Comments: The patient expressed that she had a better day.The patient also said that in group she learned more about her medications. Octavio Mannshigpen, Detria Cummings Lee 10/04/2014, 9:37 PM

## 2014-10-04 NOTE — [facility] Group Notes (Signed)
[facility]   10/04/2014 9:24 AM  Participation Quality:  Minimal   Mood/Affect:  Depressed and Flat  Depression Rating:  4-5  Anxiety Rating:  4-5  Thoughts of Suicide:  No Will you contract for safety?   NA  Current AVH:  No  Plan for Discharge/Comments:  Pt reports that she is getting ready for family session with her husband this afternoon. No new concerns other than wanting to d/c asap. Pt reports feeling better and reports that "last night I slept really well." Pt pleasant during group with flat/depressed affect.   Transportation Means: husband   Supports: Surveyor, mineralshusband/daughter  Smart, OncologistHeather LCSWA

## 2014-10-04 NOTE — Clinical Social Work Note (Signed)
CSW met with pts' husband and MD this afternoon for family session. (Pt at ED during session getting MRI testing). Pt's husband requesting referral to different psychiatrist--possibly Dr. Casimiro Needle who had been recommended by pt's therapist, Lyndee Leo. 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 Dr. Parke Poisson discussed medication changes and CSW discussed options for when pt returns home-home care, day center, etc. Dr. Parke Poisson will contact pt's providers (neurologist/therapist/psychiatrist) to get on same page and CSW will assess for appropriate psychiatry referrals. CSW to touch base with pt's husband Friday afternoon by phone.  [place], [place] 10/04/2014 4:03 PM
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
## 2014-10-04 NOTE — [facility] Group Notes (Signed)
[facility] LCSW Group Therapy  10/04/2014 2:23 PM  Type of Therapy:  Group Therapy  Participation Level:  Minimal  Participation Quality:  Inattentive  Affect:  Flat and Depressed   Cognitive:  Confused and Lacking  Insight:  Limited  Engagement in Therapy:  Limited  Modes of Intervention:  Confrontation, Discussion, Education, Exploration, Problem-solving, Rapport Building, Socialization and Support  Summary of Progress/Problems: Emotion Regulation: This group focused on both positive and negative emotion identification and allowed group members to process ways to identify feelings, regulate negative emotions, and find healthy ways to manage internal/external emotions. Group members were asked to reflect on a time when their reaction to an emotion led to a negative outcome and explored how alternative responses using emotion regulation would have benefited them. Group members were also asked to discuss a time when emotion regulation was utilized when a negative emotion was experienced. Lorene DyChristie participated minimally in discussion. She stated taht she suffered from depression due to family factors. Pt did not further engaged in group discussion and was called out by RN. She did not return to group. At this time, pt continues to make minimal progress in the group setting and demonstrates limited insight and difficluty remaining on topic. She also demonstrates some confusion regarding topics being discussed and has to be reminded about what the group is discussing often.   Smart, Sara Selvidge LCSWA  10/04/2014, 2:23 PM

## 2014-10-04 NOTE — Progress Notes (Signed)
Patient ID: Diane Richardson, female   DOB: 1951-07-24, 64 y.o.   MRN: [id number] [facility] MD Progress Note  10/04/2014 4:18 PM Diane Richardson  MRN:  [id number]   Subjective:  Patient reports she  Feels " the same".  Objective: I have discussed case with treatment team, and have met with patient. As discussed with staff, patient presents confused at times, responding fairly well to redirections and reorientation/reassurance from staff. No disruptive behaviors on unit. Has been going to some groups, but interaction and participation is limited. Patient is scheduled for Head MRI today, as part of work up for cognitive decline. With patient's express consent we had a family meeting of about 30(+) minutes with patient's husband . She did not assist. He provided collateral information, and report is that patient has been having some behavioral changes for a number of months, but has tended to worsen gradually. She had increased cognitive difficulties with certain medications as well. In addition to confusion, she has become " obsessed" with horse related issues, a perception that there is a conspiracy involving misuse /abuse of horses, and more recently a thought that she is in danger of being killed by certain politicians. As per husband , Abilify and Depakote were started relatively recently ( several weeks) due to paranoia and increased propensity to angry outbursts . Patient has been seen by an outpatient neurologist, and husband stated they were told that dementia was given as a possible diagnosis. On unit, patient's psychotic symptoms have been relatively minor- has made no mention of elaborate delusional system as described above, and her presentation has been  More characterized by confusion and poor insight. As noted, at this time we are tapering down medication doses of meds that might be contributing to cognitive/memory impairment, and have tapered down Klonopin, and have stopped  Cogentin.    Principal Problem: Cognitive Decline with psychotic symptoms/ Mood outbursts.  Diagnosis:   Patient Active Problem List   Diagnosis Date Noted  . Bipolar affective disorder, current episode hypomanic [F31.0] 09/29/2014  . Suicidal ideations [R45.851] 09/29/2014  . Depression [F32.9] 09/29/2014  . Hallucination [R44.3]   . Frequent falls [R29.6] 08/29/2014  . ADD (attention deficit disorder) [F90.9] 08/29/2014  . Anxiety [F41.9] 08/29/2014  . H/O deep venous thrombosis [Z86.718] 08/29/2014  . Healed or old pulmonary embolism [Z86.711] 08/29/2014  . 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 General psychoses [F29] 07/24/2014  . Paranoia [F22]   . Subacute confusional state [F05] 02/07/2013   Total Time spent with patient: 30 minutes   Past Medical History:  Past Medical History  Diagnosis Date  . Fibromyalgia   . PTSD (post-traumatic stress disorder)   . Anxiety   . Depression   . DVT (deep venous thrombosis)   . PE (pulmonary embolism)   . ADD (attention deficit disorder)   . Muscle spasm     bad  . Fatigue     chronic    Past Surgical History  Procedure Laterality Date  . Childbirth  1989    x1,NVD  . Pulmonary embolism surgery  1983    x9 days   Family History:  Family History  Problem Relation Age of Onset  . CAD Father   . Hyperlipidemia Father   . Hypertension Father   . Hyperlipidemia Brother   . Diabetes Maternal Grandmother   . Cancer Maternal Grandmother     stomach  . Dementia Maternal Grandfather   . Colon polyps Maternal Grandfather   . CAD Paternal Grandmother   .  Diabetes Paternal Grandmother   . Dementia Paternal Grandfather    Social History:  History  Alcohol Use  . Yes    Comment: 3 glasses of wine weekly     History  Drug Use No    History   Social History  . Marital Status: Married    Spouse Name: N/A    Number of Children: 1  . Years of Education: PHD   Occupational History  . disabled     FORMER  TEACHER   Social History Main Topics  . Smoking status: Never Smoker   . Smokeless tobacco: Never Used  . Alcohol Use: Yes     Comment: 3 glasses of wine weekly  . Drug Use: No  . Sexual Activity: Yes   Other Topics Concern  . None   Social History Narrative   Additional History:    Sleep:fair  Appetite:  Fair   Assessment:   Musculoskeletal: Strength & Muscle Tone: within normal limits Gait & Station: normal Patient leans: N/A   Psychiatric Specialty Exam: Physical Exam  ROS- has denied headache, no chest pain, no shortness of breath, no abdominal pain, no peripheral edema, no rash.   Blood pressure 96/48, pulse 79, temperature 97.8 F (36.6 C), temperature source Oral, resp. rate 18, height _0  (1.549 m), weight 123 lb (55.792 kg), SpO2 100 %.Body mass index is 23.25 kg/(m^2).  General Appearance: Fairly Groomed  Engineer, water::  Fair  Speech:  Normal Rate   Volume:  Decreased  Mood:  Anxious and mildly depressed, although affect more reactive  Affect:  more reactive,  Tends to be anxious   Thought Process:  Linear and but slowed   Orientation:  Full (Time, Place, and Person)  Thought Content:  denies hallucinations, not voicing any clear delusions at this time  Suicidal Thoughts:  No denies any plan or intention of hurting self or anyone else at this time.  Homicidal Thoughts:  No  Memory: recent and remote poor to fair.   Judgement:  Poor  Insight:  Lacking  Psychomotor Activity:  improving  Concentration:  Fair  Recall:  [employer] of Knowledge:Poor  Language: Fair  Akathisia:  No  Handed:  Right  AIMS (if indicated):     Assets:  Housing Social Support  ADL's:  Intact  Cognition: WNL and Impaired,  Moderate  Sleep:  Number of Hours: 6.5     Current Medications: Current Facility-Administered Medications  Medication Dose Route Frequency Provider Last Rate Last Dose  . acetaminophen (TYLENOL) tablet 650 mg  650 mg Oral Q6H PRN Janett Labella,  NP      . alum & mag hydroxide-simeth (MAALOX/MYLANTA) 200-200-20 MG/5ML suspension 30 mL  30 mL Oral Q4H PRN Janett Labella, NP      . ARIPiprazole (ABILIFY) tablet 15 mg  15 mg Oral Daily Jenne Campus, MD   15 mg at 10/04/14 6295  . clonazePAM (KLONOPIN) tablet 0.25 mg  0.25 mg Oral Daily Jenne Campus, MD   0.25 mg at 10/04/14 2841  . divalproex (DEPAKOTE ER) 24 hr tablet 250 mg  250 mg Oral QHS Jenne Campus, MD   250 mg at 10/03/14 2133  . ibuprofen (ADVIL,MOTRIN) tablet 600 mg  600 mg Oral Q6H PRN Lurena Nida, NP   600 mg at 09/30/14 2114  . magnesium hydroxide (MILK OF MAGNESIA) suspension 30 mL  30 mL Oral Daily PRN Janett Labella, NP      .  neomycin-bacitracin-polymyxin (NEOSPORIN) ointment   Topical TID PRN Nicholaus Bloom, MD      . traZODone (DESYREL) tablet 50 mg  50 mg Oral QHS PRN Janett Labella, NP   50 mg at 10/03/14 2131    Lab Results:  Results for orders placed or performed during the hospital encounter of 09/29/14 (from the past 48 hour(s))  Vitamin B12     Status: None   Collection Time: 10/03/14  6:30 AM  Result Value Ref Range   Vitamin B-12 624 211 - 911 pg/mL    Comment: Performed at [facility]  Folate     Status: None   Collection Time: 10/03/14  6:30 AM  Result Value Ref Range   Folate >20.0 ng/mL    Comment: (NOTE) Reference Ranges        Deficient:       0.4 - 3.3 ng/mL        Indeterminate:   3.4 - 5.4 ng/mL        Normal:              > 5.4 ng/mL Performed at [facility]   RPR     Status: None   Collection Time: 10/03/14  6:30 AM  Result Value Ref Range   RPR Ser Ql Non Reactive Non Reactive    Comment: (NOTE) Performed At: [facility] [place], [place] [id number] Lindon Romp MD PO:[id number] Performed at [facility]     Physical Findings: AIMS: Facial and Oral Movements Muscles of Facial Expression: None, normal Lips and Perioral Area: None, normal Jaw:  None, normal Tongue: None, normal,Extremity Movements Upper (arms, wrists, hands, fingers): None, normal Lower (legs, knees, ankles, toes): None, normal, Trunk Movements Neck, shoulders, hips: None, normal, Overall Severity Severity of abnormal movements (highest score from questions above): None, normal Incapacitation due to abnormal movements: None, normal Patient's awareness of abnormal movements (rate only patient's report): No Awareness, Dental Status Current problems with teeth and/or dentures?: No Does patient usually wear dentures?: No  CIWA:  CIWA-Ar Total: 1 COWS:  COWS Total Score: 1   Assessment: History of cognitive decline over recent months, and more recently onset of psychotic symptoms which tended to worsen overtime, but which now seem improved. Patient confused, slowed mentation, but no evidence of delirium . Work up for reversible causes of cognitive decline has been negative thus far. Today is scheduled for Head MRI. Tolerating medication changes well, no evidence of exacerbation of symptoms as medications have been tapered to minimize cognitive, psychiatric side effects.     Treatment Plan Summary: Daily contact with patient to assess and evaluate symptoms and progress in treatment, Medication management and Plan See below. Abilify to 15  mgrs QDAY Klonopin to 0.25  mgrs QDAY  Depakote ER  To 250 mgrs QHS  Head MRI   Today- patient to go to  [facility] for this service and to receive BZD dose x 1 to minimize anxiety/agitation during procedure. Husband has requested I communicate with PCP, Neurologist, and patient's brother, who is a physician in [place]. Will make contacts pending written consent by patient.      Medical Decision Making:  Review of Psycho-Social Stressors (1), Review or order clinical lab tests (1), Review and summation of old records (2), Review of Last Therapy Session (1), Review of Medication Regimen & Side Effects (2) and Review of New  Medication or Change in Dosage (2) Problem Points:  Established problem, stable/improving (  1), New problem, with no additional work-up planned (3), Review of last therapy session (1) and Review of psycho-social stressors (1) Data Points:  Review or order clinical lab tests (1) Review or order medicine tests (1) Review and summation of old records (2) Review of medication regiment & side effects (2)    Delante Karapetyan 10/04/2014, 4:18 PM

## 2014-10-04 NOTE — Progress Notes (Signed)
D:  Diane Richardson continues to exhibit some mild confusion this evening but is attending groups and is appropriate with other patients and staff.  She denies any thoughts of hurting herself or others.  She is attending groups. A:  Safety checks q 15 minutes.  Emotional support provided.  Medications administered as ordered. R:  Safety maintained on unit.

## 2014-10-04 NOTE — Progress Notes (Signed)
D: Pt bright on approach. Pt appears anxious this morning, fidgety and pacing. Pt denies feeling suicidal and denies having any depression. Pt presents with mild confused and delayed responses during shift assessment. Pt had difficulty remembering who she lives with. Pt initially stated that she couldn't remember who she lives with but knew that it was a family member. Pt then stated that she lives with her husband and how could she forget that. Pt right hand, ring finger noted to be swollen. Pt has been attempting to get ring off for the last three days. Writer called over to ED charge RN to have pt ring removed. RN charged in ED agreed to remove pt ring, [facility]. Pt consent to have ring removed.  A: Medications administered as ordered per MD. Verbal support given. Pt encouraged to attend groups. 15 minute checks performed for safety.  R: Pt receptive treatment.

## 2014-10-05 MED ORDER — TRAZODONE HCL 50 MG PO TABS
50.0000 mg | ORAL_TABLET | Freq: Every evening | ORAL | Status: DC | PRN
  Administered 2014-10-06: 50 mg via ORAL

## 2014-10-05 MED ORDER — HYDROXYZINE HCL 50 MG PO TABS
50.0000 mg | ORAL_TABLET | Freq: Once | ORAL | Status: AC
  Administered 2014-10-05: 50 mg via ORAL
  Filled 2014-10-05 (×2): qty 1

## 2014-10-05 NOTE — [facility] Group Notes (Signed)
[facility] LCSW Group Therapy  10/05/2014 2:30 PM  Type of Therapy:  Group Therapy  Participation Level:  Minimal  Participation Quality:  Attentive  Affect:  Depressed and Flat  Cognitive:  Lacking  Insight:  Limited  Engagement in Therapy:  Limited  Modes of Intervention:  Confrontation, Discussion, Education, Exploration, Problem-solving, Rapport Building, Socialization and Support  Summary of Progress/Problems:  Finding Balance in Life. Today's group focused on defining balance in one's own words, identifying things that can knock one off balance, and exploring healthy ways to maintain balance in life. Group members were asked to provide an example of a time when they felt off balance, describe how they handled that situation,and process healthier ways to regain balance in the future. Group members were asked to share the most important tool for maintaining balance that they [facility] and how they plan to apply this method after discharge. Diane Richardson was attentive during today's processing group, but did not actively participate in group discussion unless prompted by CSW. She shared that she was feeling more balanced today until she found out that she could not discharge tomorrow. Pt reports feeling anxious to return home "and take care of some things." Diane Richardson identified "being in nature and riding horses" as a way that she reestablishes her sense of balance. "I like being around my horses and being outside in nature."    Smart, Linn CreekHeather LCSWA 10/05/2014, 2:30 PM

## 2014-10-05 NOTE — Progress Notes (Signed)
Adult Psychoeducational Group Note  Date:  10/05/2014 Time:  8:40 PM  Group Topic/Focus:  Wrap-Up Group:   The focus of this group is to help patients review their daily goal of treatment and discuss progress on daily workbooks.  Participation Level:  Minimal  Participation Quality:  Inattentive  Affect:  Flat  Cognitive:  Disorganized  Insight: Limited  Engagement in Group:  Off Topic  Modes of Intervention:  Discussion  Additional Comments:  Pt was not on topic but did state that she may be sometimes confused due to lack of sleep.  Casilda CarlsKELLY, Allisa Einspahr H 10/05/2014, 8:40 PM

## 2014-10-05 NOTE — Progress Notes (Signed)
Patient ID: Diane Richardson, female   DOB: 06/08/1951, 64 y.o.   MRN: [id number] [facility] MD Progress Note  10/05/2014 6:10 PM Diane Richardson  MRN:  [id number]   Subjective:  Patient reports she  Feels  "OK".   Objective: I have discussed case with treatment team, and have met with patient. Patient has made some improvement since admission. She is pleasant, not agitated, has not exhibited any disruptive or agitated behaviors,  And is responsive to redirections and support from staff. Her insight is limited, although she does state that she feels she is " less capable than I was", although has difficulty defining in what way. She denies hallucinations, and does not appear internally preoccupied, but does, when specifically asked , express delusional ideations regarding some national conspiracy to kill all horses and pets. Head MRI done yesterday negative for acute changes, positive for brain atrophy.    Principal Problem: Cognitive Decline with psychotic symptoms/ Mood outbursts.  Diagnosis:   Patient Active Problem List   Diagnosis Date Noted  . Bipolar affective disorder, current episode hypomanic [F31.0] 09/29/2014  . Suicidal ideations [R45.851] 09/29/2014  . Depression [F32.9] 09/29/2014  . Hallucination [R44.3]   . Frequent falls [R29.6] 08/29/2014  . ADD (attention deficit disorder) [F90.9] 08/29/2014  . Anxiety [F41.9] 08/29/2014  . H/O deep venous thrombosis [Z86.718] 08/29/2014  . Healed or old pulmonary embolism [Z86.711] 08/29/2014  . 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 General psychoses [F29] 07/24/2014  . Paranoia [F22]   . Subacute confusional state [F05] 02/07/2013   Total Time spent with patient: 20 minutes   Past Medical History:  Past Medical History  Diagnosis Date  . Fibromyalgia   . PTSD (post-traumatic stress disorder)   . Anxiety   . Depression   . DVT (deep venous thrombosis)   . PE (pulmonary embolism)   . ADD (attention deficit disorder)   .  Muscle spasm     bad  . Fatigue     chronic    Past Surgical History  Procedure Laterality Date  . Childbirth  1989    x1,NVD  . Pulmonary embolism surgery  1983    x9 days   Family History:  Family History  Problem Relation Age of Onset  . CAD Father   . Hyperlipidemia Father   . Hypertension Father   . Hyperlipidemia Brother   . Diabetes Maternal Grandmother   . Cancer Maternal Grandmother     stomach  . Dementia Maternal Grandfather   . Colon polyps Maternal Grandfather   . CAD Paternal Grandmother   . Diabetes Paternal Grandmother   . Dementia Paternal Grandfather    Social History:  History  Alcohol Use  . Yes    Comment: 3 glasses of wine weekly     History  Drug Use No    History   Social History  . Marital Status: Married    Spouse Name: N/A    Number of Children: 1  . Years of Education: PHD   Occupational History  . disabled     FORMER TEACHER   Social History Main Topics  . Smoking status: Never Smoker   . Smokeless tobacco: Never Used  . Alcohol Use: Yes     Comment: 3 glasses of wine weekly  . Drug Use: No  . Sexual Activity: Yes   Other Topics Concern  . None   Social History Narrative   Additional History:    Sleep:fair  Appetite:  Fair   Assessment:  Musculoskeletal: Strength & Muscle Tone: within normal limits Gait & Station: normal Patient leans: N/A   Psychiatric Specialty Exam: Physical Exam  ROS- has denied headache, no chest pain, no shortness of breath, no abdominal pain, no peripheral edema, no rash.   Blood pressure 120/53, pulse 70, temperature 97.9 F (36.6 C), temperature source Oral, resp. rate 18, height 5' 1"  (1.549 m), weight 123 lb (55.792 kg), SpO2 100 %.Body mass index is 23.25 kg/(m^2).  General Appearance: improved grooming  Eye Contact::  Good  Speech:  Normal Rate   Volume:  Decreased  Mood:  mood seems improved and presents less depressed , with a reactive affect , smiles at times  appropriately  Affect:  more reactive,  Less anxious today  Thought Process:  Linear and but slowed   Orientation:  Other:  states " [facility] Knows January, 2016, Winter, but not date or day of week.   Thought Content:  Paranoid Ideation- no hallucinations  Suicidal Thoughts:  No denies any plan or intention of hurting self or anyone else at this time.  Homicidal Thoughts:  No  Memory: recent and remote poor to fair. Recall was 3/3 immediate, and 1/3 at 3 minutes   Judgement:  Poor  Insight:  Lacking  Psychomotor Activity:  improving  Concentration:  Fair  Recall:  [employer] of Knowledge:Poor  Language: Fair  Akathisia:  No  Handed:  Right  AIMS (if indicated):     Assets:  Housing Social Support  ADL's:  Intact  Cognition: WNL and Impaired,  Moderate  Sleep:  Number of Hours: 6.75     Current Medications: Current Facility-Administered Medications  Medication Dose Route Frequency Provider Last Rate Last Dose  . acetaminophen (TYLENOL) tablet 650 mg  650 mg Oral Q6H PRN Janett Labella, NP      . alum & mag hydroxide-simeth (MAALOX/MYLANTA) 200-200-20 MG/5ML suspension 30 mL  30 mL Oral Q4H PRN Janett Labella, NP      . ARIPiprazole (ABILIFY) tablet 15 mg  15 mg Oral Daily Jenne Campus, MD   15 mg at 10/05/14 0809  . clonazePAM (KLONOPIN) tablet 0.25 mg  0.25 mg Oral Daily Myer Peer Cobos, MD   0.25 mg at 10/05/14 0809  . divalproex (DEPAKOTE ER) 24 hr tablet 250 mg  250 mg Oral QHS Jenne Campus, MD   250 mg at 10/04/14 2148  . ibuprofen (ADVIL,MOTRIN) tablet 600 mg  600 mg Oral Q6H PRN Lurena Nida, NP   600 mg at 10/04/14 1711  . magnesium hydroxide (MILK OF MAGNESIA) suspension 30 mL  30 mL Oral Daily PRN Freda Munro May Agustin, NP      . neomycin-bacitracin-polymyxin (NEOSPORIN) ointment   Topical TID PRN Nicholaus Bloom, MD      . traZODone (DESYREL) tablet 50 mg  50 mg Oral QHS PRN Freda Munro May Agustin, NP   50 mg at 10/03/14 2131     Lab Results:  No results found for this or any previous visit (from the past 48 hour(s)).  Physical Findings: AIMS: Facial and Oral Movements Muscles of Facial Expression: None, normal Lips and Perioral Area: None, normal Jaw: None, normal Tongue: None, normal,Extremity Movements Upper (arms, wrists, hands, fingers): None, normal Lower (legs, knees, ankles, toes): None, normal, Trunk Movements Neck, shoulders, hips: None, normal, Overall Severity Severity of abnormal movements (highest score from questions above): None, normal Incapacitation due to abnormal movements: None, normal Patient's awareness of abnormal movements (  rate only patient's report): No Awareness, Dental Status Current problems with teeth and/or dentures?: No Does patient usually wear dentures?: No  CIWA:  CIWA-Ar Total: 2 COWS:  COWS Total Score: 1   Assessment: Some improvement compared to admission. Less anxious , less depressed. Not overtly psychotic but some delusional ideations become apparent when specifically asked about. No medication side effects. Insight is limited .      Treatment Plan Summary: Daily contact with patient to assess and evaluate symptoms and progress in treatment, Medication management and Plan See below. [id number]  mgrs QDAY Will D/C klonopin as of tomorrow, to minimize BZD induced cognitive deficits.  Depakote ER   250 mgrs QHS  SW and myself have met with husband , he wants her to return home after discharge, and is looking into home health care assistance and other outpatient options.       Medical Decision Making:  Review of Psycho-Social Stressors (1), Review or order clinical lab tests (1), Review and summation of old records (2), Review of Last Therapy Session (1), Review of Medication Regimen & Side Effects (2) and Review of New Medication or Change in Dosage (2) Problem Points:  Established problem, stable/improving (1), New problem, with no additional work-up planned  (3), Review of last therapy session (1) and Review of psycho-social stressors (1) Data Points:  Review or order clinical lab tests (1) Review or order medicine tests (1) Review and summation of old records (2) Review of medication regiment & side effects (2)    COBOS, FERNANDO 10/05/2014, 6:10 PM

## 2014-10-05 NOTE — Progress Notes (Signed)
D:  Patient's self inventory sheet, patient slept good last night, sleep medication was helpful.  Good appetite, normal energy level, good concentration.  Rated depression 2-3, hopeless 1-2, anxiety 5.  Not sure of withdrawals, diarrhea, cramping, agitation, runny nose.  Denied SI.  Physical problems, pain, headaches.  Headache, sore finger, off/on.  Medication helpful.  Goal is to understand when, why, how much, how med should be take.  Plans to talk to MD how does she know if and when she should be stopping.  No discharge plan.  Hopes the plan can be modified. A:  Medications administered per MD orders.  Emotional support and encouragement given patient. R:  Denied SI and Hi, contracts for safety.  Denied A/V hallucinations.  Safety maintained with 15 minute checks.

## 2014-10-05 NOTE — Plan of Care (Signed)
Problem: Alteration in mood Goal: LTG-Pt's behavior demonstrates decreased signs of depression Pt rates depression as 8/10 and presents with depressed mood and flat affect. Pt requesting to be d/ced today. Minimal information provided by CSW. Pt blamed husband for her admission when asked her reason for hospitalization. Minimal insight. By d/c, pt will rate depression as 3 or less and pt presentation will correspond to self report. Goal not met. [place], [place] 10/02/2014 10:26 AM  Outcome: Completed/Met Date Met:  10/05/14 Nurse discussed depression with patient.

## 2014-10-05 NOTE — Progress Notes (Signed)
Patient ID: [name], female   DOB: 08/17/1951, 64 y.o.   MRN: [id number] D: client visited with her husband tonight. Client still has periods of confusion report "todays is November 22nd or 23rd, no it's not it's January, 2016, Monday. "I am more optimistic today than yesterday, I slept through the night" "I like the groups, like that there are different ones doing the group, like the diversity, each one has something different to say, you learn from each one point of view" A: Writer provided emotional support, reviewed medications, administered as ordered. Staff will monitor q2415min for safety. R: client is safe on the unit, attended group. "I thought I was suppose to get Klonopin twice todayPatent attorney" writer obtained order for Hydroxyzine 50 mg for anxiety and a repeat Trazodone.

## 2014-10-05 NOTE — [facility] Group Notes (Signed)
The focus of this group is to educate the patient on the purpose and policies of crisis stabilization and provide a format to answer questions about their admission.  The group details unit policies and expectations of patients while admitted.  Patient attended 0900 nurse education orientation group this morning.  Patient actively participated, appropriate affect, alert, appropriate insight and engagement.  Today patient will work on 3 goals for discharge.  

## 2014-10-05 NOTE — Progress Notes (Signed)
Patient's husband came to visit her tonight.  Husband stated he has all her rings, and took the ring that was cut off her finger with him tonight.  Patient is wearing her earrings tonight and husband acknowledges.

## 2014-10-06 LAB — VITAMIN D 1,25 DIHYDROXY
VITAMIN D3 1, 25 (OH): 67 pg/mL
Vitamin D 1, 25 (OH)2 Total: 67 pg/mL (ref 18–72)
Vitamin D2 1, 25 (OH)2: <8 pg/mL

## 2014-10-06 LAB — VALPROIC ACID LEVEL: VALPROIC ACID LVL: 13.1 ug/mL — AB (ref 50.0–100.0)

## 2014-10-06 MED ORDER — ARIPIPRAZOLE 10 MG PO TABS
20.0000 mg | ORAL_TABLET | Freq: Every day | ORAL | Status: DC
  Administered 2014-10-07 – 2014-10-09 (×3): 20 mg via ORAL
  Filled 2014-10-06 (×5): qty 2

## 2014-10-06 MED ORDER — TRAZODONE HCL 50 MG PO TABS
75.0000 mg | ORAL_TABLET | Freq: Every day | ORAL | Status: DC
  Administered 2014-10-06 – 2014-10-08 (×2): 75 mg via ORAL
  Filled 2014-10-06 (×5): qty 1.5

## 2014-10-06 NOTE — Progress Notes (Signed)
Patient ID: Diane Richardson, female   DOB: [birth year], 64 y.o.   MRN: [id number] [facility] MD Progress Note  10/06/2014 12:30 PM Diane Richardson  MRN:  [id number]   Subjective:  Patient reports poor sleep last night and reports she feels " something happened last night , like these young people were harrassing people, and calling out my name".  Objective: I have discussed case with treatment team, and have met with patient. Patient presents more animated and more conversant, and with improved mood overall. She is fully alert and attentive but remains partially disoriented, particularly to day of week and exact name of hospital. Her memory/cognition seems partially improved today and for example was able to remember names and clinics of her neurologist, psychiatrist and PCP.  Of note, she does present with increased psychotic symptoms , and reports a worry that she and her daughter are in danger because of her research into horse issues on the internet. No agitated or disruptive behaviors on unit. With her express consent I have spoken with husband , who has come to visit her daily, and who corroborates that her mood seems improved. With patient's express consent I contacted her PCP, Dr. Tamala Julian, and left message to discuss case. I also spoke with Dr. Toy Care, her outpatient psychiatrist, who agreed with medication adjustments, and I spoke with Dr. Pryor Montes, her Neurologist, and she made an appt to see patient on February 8th at 11,15 AM ( patient's husband aware ) . Husband also wanted me to contact patient's brother, who is a physician, but patient declined to provide consent.  Patient responds partially to reorientation , reassurance and support. Denies medication side effects- no akatisia.     Principal Problem: Cognitive Decline with psychotic symptoms/ Mood outbursts.  Diagnosis:   Patient Active Problem List   Diagnosis Date Noted  . Bipolar affective disorder, current episode hypomanic [F31.0]  09/29/2014  . Suicidal ideations [R45.851] 09/29/2014  . Depression [F32.9] 09/29/2014  . Hallucination [R44.3]   . Frequent falls [R29.6] 08/29/2014  . ADD (attention deficit disorder) [F90.9] 08/29/2014  . Anxiety [F41.9] 08/29/2014  . H/O deep venous thrombosis [Z86.718] 08/29/2014  . Healed or old pulmonary embolism [Z86.711] 08/29/2014  . 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 General psychoses [F29] 07/24/2014  . Paranoia [F22]   . Subacute confusional state [F05] 02/07/2013   Total Time spent with patient: 30 minutes   Past Medical History:  Past Medical History  Diagnosis Date  . Fibromyalgia   . PTSD (post-traumatic stress disorder)   . Anxiety   . Depression   . DVT (deep venous thrombosis)   . PE (pulmonary embolism)   . ADD (attention deficit disorder)   . Muscle spasm     bad  . Fatigue     chronic    Past Surgical History  Procedure Laterality Date  . Childbirth  1989    x1,NVD  . Pulmonary embolism surgery  1983    x9 days   Family History:  Family History  Problem Relation Age of Onset  . CAD Father   . Hyperlipidemia Father   . Hypertension Father   . Hyperlipidemia Brother   . Diabetes Maternal Grandmother   . Cancer Maternal Grandmother     stomach  . Dementia Maternal Grandfather   . Colon polyps Maternal Grandfather   . CAD Paternal Grandmother   . Diabetes Paternal Grandmother   . Dementia Paternal Grandfather    Social History:  History  Alcohol Use  .  Yes    Comment: 3 glasses of wine weekly     History  Drug Use No    History   Social History  . Marital Status: Married    Spouse Name: N/A    Number of Children: 1  . Years of Education: PHD   Occupational History  . disabled     FORMER TEACHER   Social History Main Topics  . Smoking status: Never Smoker   . Smokeless tobacco: Never Used  . Alcohol Use: Yes     Comment: 3 glasses of wine weekly  . Drug Use: No  . Sexual Activity: Yes   Other Topics  Concern  . None   Social History Narrative   Additional History:    Sleep:fair  Appetite:  Fair   Assessment:   Musculoskeletal: Strength & Muscle Tone: within normal limits Gait & Station: normal Patient leans: N/A   Psychiatric Specialty Exam: Physical Exam  ROS- has denied headache, no chest pain, no shortness of breath, no abdominal pain, no peripheral edema, no rash. No fever, no chills, no akatisia or abnormal involuntary movements noted .  Blood pressure 129/64, pulse 77, temperature 97.9 F (36.6 C), temperature source Oral, resp. rate 18, height _0  (1.549 m), weight 123 lb (55.792 kg), SpO2 100 %.Body mass index is 23.25 kg/(m^2).  General Appearance: improved grooming  Eye Contact::  Good  Speech:  Normal Rate   Volume:  Normal  Mood:  Improved, affect more reactive   Affect:  more reactive,    Thought Process:  Linear and but slowed   Orientation:  Other:  partially oriented to place and time  Thought Content:  Paranoid Ideation- no hallucinations- today seems more focused on delusional ideations.  Suicidal Thoughts:  No denies any plan or intention of hurting self or anyone else at this time.  Homicidal Thoughts:  No  Memory: recent and remote poor to fair. Recall was 3/3 immediate, and 1/3 at 3 minutes   Judgement:  Poor  Insight:  Lacking  Psychomotor Activity:  improving  Concentration:  Fair  Recall:  [employer] of Knowledge:Poor  Language: Fair  Akathisia:  No  Handed:  Right  AIMS (if indicated):     Assets:  Housing Social Support  ADL's:  Intact  Cognition: WNL and Impaired,  Moderate  Sleep:  Number of Hours: 3.25     Current Medications: Current Facility-Administered Medications  Medication Dose Route Frequency Provider Last Rate Last Dose  . acetaminophen (TYLENOL) tablet 650 mg  650 mg Oral Q6H PRN Janett Labella, NP      . alum & mag hydroxide-simeth (MAALOX/MYLANTA) 200-200-20 MG/5ML suspension 30 mL  30 mL Oral Q4H PRN  Janett Labella, NP      . ARIPiprazole (ABILIFY) tablet 15 mg  15 mg Oral Daily Jenne Campus, MD   15 mg at 10/06/14 0754  . divalproex (DEPAKOTE ER) 24 hr tablet 250 mg  250 mg Oral QHS Jenne Campus, MD   250 mg at 10/05/14 2131  . ibuprofen (ADVIL,MOTRIN) tablet 600 mg  600 mg Oral Q6H PRN Lurena Nida, NP   600 mg at 10/04/14 1711  . magnesium hydroxide (MILK OF MAGNESIA) suspension 30 mL  30 mL Oral Daily PRN Freda Munro May Agustin, NP      . neomycin-bacitracin-polymyxin (NEOSPORIN) ointment   Topical TID PRN Nicholaus Bloom, MD      . traZODone (DESYREL) tablet 50 mg  50 mg Oral  QHS PRN,MR X 1 Evanna Cori Greig Castilla, NP   50 mg at 10/06/14 6415    Lab Results:  No results found for this or any previous visit (from the past 48 hour(s)).  Physical Findings: AIMS: Facial and Oral Movements Muscles of Facial Expression: None, normal Lips and Perioral Area: None, normal Jaw: None, normal Tongue: None, normal,Extremity Movements Upper (arms, wrists, hands, fingers): None, normal Lower (legs, knees, ankles, toes): None, normal, Trunk Movements Neck, shoulders, hips: None, normal, Overall Severity Severity of abnormal movements (highest score from questions above): None, normal Incapacitation due to abnormal movements: None, normal Patient's awareness of abnormal movements (rate only patient's report): No Awareness, Dental Status Current problems with teeth and/or dentures?: No Does patient usually wear dentures?: No  CIWA:  CIWA-Ar Total: 2 COWS:  COWS Total Score: 1   Assessment: Mood is improving , and to some degree, although still confused, cognition seems somewhat improved. She is , however, more paranoid today, expressing delusional ideations of being in danger of bodily harm due to prior research /interest in horses . Tolerating medications well. Will increase antipsychotic dose. .      Treatment Plan Summary: Daily contact with patient to assess and evaluate symptoms  and progress in treatment, Medication management and Plan See below. Increase Abilify to 20 mgrs QDAY Depakote ER   250 mgrs QHS Obtain Valproic Acid Serum level  Trazodone 75 mgrs QHS for insomnia  Of note, patient is wanting discharge soon, although agreeing to remain in hospital " just a little longer for the medications to work".  I have spoken with husband- he will have home health services to help as of Monday. If improves further, would consider discharge Sunday. Husband aware and agrees.        Medical Decision Making:  Review of Psycho-Social Stressors (1), Review or order clinical lab tests (1), Review and summation of old records (2), Review of Last Therapy Session (1), Review of Medication Regimen & Side Effects (2) and Review of New Medication or Change in Dosage (2) Problem Points:  Established problem, stable/improving (1), New problem, with no additional work-up planned (3), Review of last therapy session (1) and Review of psycho-social stressors (1) Data Points:  Review or order clinical lab tests (1) Review or order medicine tests (1) Review of medication regiment & side effects (2) Review of new medications or change in dosage (2)    COBOS, FERNANDO 10/06/2014, 12:30 PM

## 2014-10-06 NOTE — Progress Notes (Signed)
  [facility] Discharge Plan :  Will you be returning to the same living situation after discharge:  Yes,  home with husband At discharge, do you have transportation home?: Yes,  husband coming sunday after lunch Do you have the ability to pay for your medications: [employer] medicare  Release of information consent forms completed and submitted to medical records by CSW.  Patient to Follow up at: Follow-up Information    Follow up with [facility].   Why:  Alan RipperClaire will call you directly at discharge. If you do not get call by Monday, please call her to schedule appt.    Contact information:   ATTN: Arbutus PedClaire Huprich, LCSW [address] [place], [place] [address] Phone: [phone]. [phone] Fax: [phone]      Follow up with [facility].   Why:  Social worker informed husband that he must call to schedule appt at this agency due to deposit requirement. Please request Dr. Donell BeersPlovsky.    Contact information:   ATTN: Dr. Donell BeersPlovsky [address] [place], [place] [address] Phone: [phone] Fax: [phone]      Follow up with [facility] On 10/13/2014.   Why:  Appt for neurological consult on this date at 11:15AM.    Contact information:   ATTN: Dr. Porfirio Mylararmen Dohmeier [address] [place], [place] [address] Phone: [phone] Fax: [phone]      [facility].   Why:  Mr. Irving CopasFinn: Please call on Monday to discuss referral-we are sending discharge paperwork to this facility and they are asking for 2 years worth of records. They will work with you directly to get Camashristi further memory testing.   Contact information:   [facility] [place], [place] [address] Phone: [phone] Fax: [phone]      Patient denies SI/HI: Yes,  during group/self report.     Safety Planning and Suicide Prevention discussed:  Yes,  SPE completed with pt's husband. SPI pamphlet provided to pt and she was encouraged to share information with support network, ask questions, and talk about any concerns relating to SPE.  Has patient been referred to the Quitline?: N/A patient is not a smoker  Smart, Lebron QuamHeather LCSWA  10/06/2014, 2:52 PM

## 2014-10-06 NOTE — [facility] Group Notes (Signed)
[facility]   10/06/2014 9:31 AM  Participation Quality:  Appropriate   Mood/Affect:  Appropriate  Depression Rating:  0  Anxiety Rating:  0  Thoughts of Suicide:  No Will you contract for safety?   NA  Current AVH:  No  Plan for Discharge/Comments:  Pt tangential throughout group. Positive message to others who are discharging-"I came here to relearn how to sleep. I've learned a lot. You all are good people. Learn from being here and be the best version of yourself." Pt reports poor sleep due to "staff kept coming in and loudly checking on me." Pt agreeable to referral to Dr. Donell BeersPlovsky rather than Dr. Evelene CroonKaur per the recommendation of her therapist, Alan RipperClaire. CSW assessing.   Transportation Means: husband  Supports: husband   Counselling psychologistmart, OncologistHeather LCSWA

## 2014-10-06 NOTE — Progress Notes (Signed)
Writer spoke with patient 1:1 and she requested her night medications and writer explained that it was too early. She reports not resting well last night and wants to lay down and get some rest. Patient's husband visited earlier and inquired about her hs medications and wanted to know what she has for sleep. Writer provided husband with information. During my conversation with patient she made mention several times that she hears someone calling her name. Patient is anxious and constantly pacing from her room to the hallway or dayroom. She came to Clinical research associatewriter after she had received her night medications and told writer that there was a small piece of glass in her room on the floor. Writer went in and found that what she thought was small piece of glass was actually a piece of torn paper. Writer encouraged her to lie down and rest since she has had medications. She has disorganized thinking and continues to be paranoid. Safety maintained on unit with 15 min checks.

## 2014-10-06 NOTE — Tx Team (Addendum)
Interdisciplinary Treatment Plan Update (Adult)   Date: 10/06/2014  Time Reviewed:10:05 AM  Progress in Treatment:  Attending groups: Yes Participating in groups: Yes  Taking medication as prescribed: Yes  Tolerating medication: Yes  Family/Significant othe contact made: SPE completed with pt's husband. Family session also completed with husband. Patient understands diagnosis: Minimal insight-pt disclosing little about reason for admission "My husband made me come." Pt admitted due to paranoia, tangential thought process, Passive SI, and for med stabilization.  Discussing patient identified problems/goals with staff: Yes  Medical problems stabilized or resolved: Yes  Denies suicidal/homicidal ideation: Yes during group/self report.  Patient has not harmed self or Others: Yes  New problem(s) identified:  Discharge Plan or Barriers: Pt agreeable to referral for med management with Dr. Donell BeersPlovsky at [facility] rather than returning to Dr. Evelene CroonKaur. Pt to follow up with her therapist, Alan RipperClaire, at [facility]. Pt's husband reported that she has follow-up neurology appt scheduled already. She will return home on Saturday.  Additional comments:  Diane Richardson is a 64 y.o. female patient admitted with Paranoia and suicide threat. She presented to [facility] tangential and hypomanic upset with husband and daughter per ED report. Patient came in stating she was feeling bad and would hurt self if she was not able to her house. [facility]. Patient was disorganized and challenging to follow her thought patterns. She mentioned a weekly "pill box" and that my daughter had messed up filling them" The patient is difficult to get to answer questions directly. She states that she has suffered multiple injuries on her horse, including hitting her head. The patient has not been taking her medications.She appears somnolent. She is not talkative and hard to get answers to  questions directly. She is not exhibiting disruptive behaviors on the unit. She is adherent to medication regimen.   10/06/14: Pt pleasant and cooperative with staff and other pts. Attending groups. Pt continues to demonstrate tangential thought process, confusion, and some delusional beliefs. Pt has difficulty following group topics and continues to demonstrate signs of early onset dementia.  Reason for Continuation of Hospitalization: Disorganized thought process; delusions, paranoid ideation Medication stabilization Estimated length of stay: 2 day-d/c scheduled for Sunday 1/301/16 per Dr. Jama Flavorsobos For review of initial/current patient goals, please see plan of care.  Attendees:  Patient:    Family:    Physician: Dr. Jama Flavorsobos MD  10/06/2014 10:05 AM   Nursing: Wynetta FinesBritney, Caroline RN 10/06/2014 10:05 AM   Clinical Social Worker  Streeper Smart, LCSWA  10/06/2014 10:05 AM   Other: Alonna MiniumQuylle H LCSW; Kristin D. LCSWA 10/06/2014 10:05 AM   Other: Vikki PortsValerie Care Coordinator 10/06/2014 10:05 AM   Other: Darden DatesJennifer C. Nurse CM 10/06/2014 10:08 AM   Other:    Scribe for Treatment Team:  [employer] Smart LCSWA 10/06/2014 10:05 AM

## 2014-10-06 NOTE — Progress Notes (Signed)
D: Patient denies SI/HI and A/V hallucinations; patient reports that she did not sleep well; patient states that she was up all night because girls were calling through the ceiling at me and patient states" I know they know what they are doing"  A: Monitored q 15 minutes; patient encouraged to attend groups; patient educated about medications; patient given medications per physician orders; patient encouraged to express feelings and/or concerns  R: Patient is able to answer some things with coherency but is confused; patient presented to writer concerned about children in the facility and states " the students should not be done like this"; patient is constantly talking about telling someone about what is going on with the students but cautious because she reports that she may get into trouble; patient's interaction with staff and peers is appropriate; patient can be tangential but redirection is needed frequently; SI; patient is taking medications as prescribed and tolerating medications; patient is attending all groups

## 2014-10-06 NOTE — Progress Notes (Signed)
Adult Psychoeducational Group Note  Date:  10/06/2014 Time:  1:19 PM  Group Topic/Focus:  Developing a Wellness Toolbox:   The focus of this group is to help patients develop a "wellness toolbox" with skills and strategies to promote recovery upon discharge.  Participation Level:  Active  Participation Quality:  Appropriate  Affect:  Anxious and Flat  Cognitive:  Alert  Insight: Limited  Engagement in Group:  Engaged and Off Topic  Modes of Intervention:  Activity, Clarification, Discussion, Education and Support  Additional Comments:  Gratitude Journaling Pt met with the doctor for the first part of the group but was able to grasp the concept of gratitude journaling.  Pt appeared to understand the concept of expressing gratitude and gratitude journaling as a way to elevate mood when feeling stressed or depressed.  Pt presented as hyper-verbal and tangential as she shared her passion for the need to protect children from bullying, drug dealers, and abuse.  Versie Starks 10/06/2014, 1:19 PM

## 2014-10-06 NOTE — Progress Notes (Signed)
D: Client up confused, agitated, insinuates that she heard someone talking about her. "I don't think I'm ignorant at all, I heard my name then I can't make out the rest, but I can hear well, I had my ears tested both of them" "I'm educated and taught school for 32 years, at [employer] in [place]" "I worked with many diversity brown, black, whatever you want to call it and I work there because I choose too" "I know there are kids all over the place and we all trying to keep our jobs" "I don't think any less of you, I just think I'm going to be leaving" "I was going to stay another day, but I think I'm going to leave. A: Writer provided emotional support listening and reassuring client that she was safe. Staff will monitor q1815min for safety. R: client is safe on the unit, but up several time during the night even with sleep medication and medication for anxiety. Client reported seeing monsters and over hearing conversations that never were made.

## 2014-10-06 NOTE — [facility] Group Notes (Signed)
[facility] LCSW Group Therapy  10/06/2014 4:12 PM  Type of Therapy:  Group Therapy  Participation Level:  Active  Participation Quality:  Redirectable  Affect:  Irritable  Cognitive:  Disorganized, Confused and Delusional  Insight:  Distracting and Lacking  Engagement in Therapy:  Limited and Off Topic  Modes of Intervention:  Confrontation, Discussion, Education, Exploration, Problem-solving, Rapport Building, Socialization and Support  Summary of Progress/Problems: Feelings around Relapse. Group members discussed the meaning of relapse and shared personal stories of relapse, how it affected them and others, and how they perceived themselves during this time. Group members were encouraged to identify triggers, warning signs and coping skills used when facing the possibility of relapse. Social supports were discussed and explored in detail. Lorene DyChristie was attentive during group but had difficulty remaining on topic, often presenting as confused, with delusional and tangential speech. Lorene DyChristie shared that she is able to detach from social media, which helps her avoid feeling depressed. Lorene DyChristie continues to demonstrate limited insight and tended to go off topic during group-"I have to do research because things are cited incorrectly and there are people that are trying to hurt horses and other people." She was redirectable.   Smart, Jamian Andujo LCSWA 10/06/2014, 4:12 PM

## 2014-10-07 DIAGNOSIS — F39 Unspecified mood [affective] disorder: Secondary | ICD-10-CM

## 2014-10-07 DIAGNOSIS — R4181 Age-related cognitive decline: Secondary | ICD-10-CM

## 2014-10-07 MED ORDER — LORAZEPAM 1 MG PO TABS
2.0000 mg | ORAL_TABLET | Freq: Once | ORAL | Status: AC
  Administered 2014-10-07: 2 mg via ORAL

## 2014-10-07 MED ORDER — LORAZEPAM 1 MG PO TABS
ORAL_TABLET | ORAL | Status: AC
  Filled 2014-10-07: qty 2

## 2014-10-07 NOTE — Progress Notes (Signed)
Patient ID: Diane Richardson, female   DOB: 17-Jan-1951, 64 y.o.   MRN: [id number] Patient ID: Diane Richardson, female   DOB: 1950-10-04, 64 y.o.   MRN: [id number] [facility] MD Progress Note  10/07/2014 6:10 PM Diane Richardson  MRN:  [id number]   Subjective:  Diane Richardson reports, "I see some people shooting through the window. I have to take cover"  Objective: Patient was seen, chart reviewed. Diane Richardson was observed in her room with a blanket over her head. She says she is trying to take cover from people shooting through her window. She seem fearful & paranoiad. She says she does not want to answer questions at this point because she does not have a legal representation.  Principal Problem: Cognitive Decline with psychotic symptoms/ Mood outbursts.  Diagnosis:   Patient Active Problem List   Diagnosis Date Noted  . Bipolar affective disorder, current episode hypomanic [F31.0] 09/29/2014  . Suicidal ideations [R45.851] 09/29/2014  . Depression [F32.9] 09/29/2014  . Hallucination [R44.3]   . Frequent falls [R29.6] 08/29/2014  . ADD (attention deficit disorder) [F90.9] 08/29/2014  . Anxiety [F41.9] 08/29/2014  . H/O deep venous thrombosis [Z86.718] 08/29/2014  . Healed or old pulmonary embolism [Z86.711] 08/29/2014  . 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 General psychoses [F29] 07/24/2014  . Paranoia [F22]   . Subacute confusional state [F05] 02/07/2013   Total Time spent with patient: 30 minutes   Past Medical History:  Past Medical History  Diagnosis Date  . Fibromyalgia   . PTSD (post-traumatic stress disorder)   . Anxiety   . Depression   . DVT (deep venous thrombosis)   . PE (pulmonary embolism)   . ADD (attention deficit disorder)   . Muscle spasm     bad  . Fatigue     chronic    Past Surgical History  Procedure Laterality Date  . Childbirth  1989    x1,NVD  . Pulmonary embolism surgery  1983    x9 days   Family History:  Family History  Problem Relation Age of  Onset  . CAD Father   . Hyperlipidemia Father   . Hypertension Father   . Hyperlipidemia Brother   . Diabetes Maternal Grandmother   . Cancer Maternal Grandmother     stomach  . Dementia Maternal Grandfather   . Colon polyps Maternal Grandfather   . CAD Paternal Grandmother   . Diabetes Paternal Grandmother   . Dementia Paternal Grandfather    Social History:  History  Alcohol Use  . Yes    Comment: 3 glasses of wine weekly     History  Drug Use No    History   Social History  . Marital Status: Married    Spouse Name: N/A    Number of Children: 1  . Years of Education: PHD   Occupational History  . disabled     FORMER TEACHER   Social History Main Topics  . Smoking status: Never Smoker   . Smokeless tobacco: Never Used  . Alcohol Use: Yes     Comment: 3 glasses of wine weekly  . Drug Use: No  . Sexual Activity: Yes   Other Topics Concern  . None   Social History Narrative   Additional History:    Sleep: Fair  Appetite:  Fair  Assessment:   Musculoskeletal: Strength & Muscle Tone: within normal limits Gait & Station: normal Patient leans: N/A  Psychiatric Specialty Exam: Physical Exam  ROS- has denied headache, no chest pain,  no shortness of breath, no abdominal pain, no peripheral edema, no rash. No fever, no chills, no akatisia or abnormal involuntary movements noted .  Blood pressure 132/118, pulse 102, temperature 98.2 F (36.8 C), temperature source Oral, resp. rate 18, height 5\' 1"  (1.549 m), weight 55.792 kg (123 lb), SpO2 100 %.Body mass index is 23.25 kg/(m^2).  General Appearance: improved grooming  Eye Contact::  Good  Speech:  Normal Rate   Volume:  Normal  Mood:  Improved, affect more reactive   Affect:  Fearful,    Thought Process:  Linear and but slowed   Orientation:  Other:  partially oriented to place and time  Thought Content:  Paranoid Ideation- no hallucinations- more delusional ideations.  Suicidal Thoughts:  No denies  any plan or intention of hurting self or anyone else at this time.  Homicidal Thoughts:  No  Memory: recent and remote poor to fair. Recall was 3/3 immediate, and 1/3 at 3 minutes   Judgement:  Poor  Insight:  Lacking  Psychomotor Activity:  improving  Concentration:  Fair  Recall:  [employer]  Fund of Knowledge:Poor  Language: Fair  Akathisia:  No  Handed:  Right  AIMS (if indicated):     Assets:  Housing Social Support  ADL's:  Intact  Cognition: WNL and Impaired,  Moderate  Sleep:  Number of Hours: 1.5     Current Medications: Current Facility-Administered Medications  Medication Dose Route Frequency Provider Last Rate Last Dose  . acetaminophen (TYLENOL) tablet 650 mg  650 mg Oral Q6H PRN Lindwood QuaSheila May Agustin, NP      . alum & mag hydroxide-simeth (MAALOX/MYLANTA) 200-200-20 MG/5ML suspension 30 mL  30 mL Oral Q4H PRN Lindwood QuaSheila May Agustin, NP      . ARIPiprazole (ABILIFY) tablet 20 mg  20 mg Oral Daily Craige CottaFernando A Cobos, MD   20 mg at 10/07/14 0906  . divalproex (DEPAKOTE ER) 24 hr tablet 250 mg  250 mg Oral QHS Craige CottaFernando A Cobos, MD   250 mg at 10/06/14 2054  . ibuprofen (ADVIL,MOTRIN) tablet 600 mg  600 mg Oral Q6H PRN Kristeen MansFran E Hobson, NP   600 mg at 10/04/14 1711  . magnesium hydroxide (MILK OF MAGNESIA) suspension 30 mL  30 mL Oral Daily PRN Velna HatchetSheila May Agustin, NP      . neomycin-bacitracin-polymyxin (NEOSPORIN) ointment   Topical TID PRN Rachael FeeIrving A Ossie Yebra, MD      . traZODone (DESYREL) tablet 75 mg  75 mg Oral QHS Craige CottaFernando A Cobos, MD   75 mg at 10/06/14 2055    Lab Results:  Results for orders placed or performed during the hospital encounter of 09/29/14 (from the past 48 hour(s))  Valproic acid level     Status: Abnormal   Collection Time: 10/06/14  7:41 PM  Result Value Ref Range   Valproic Acid Lvl 13.1 (L) 50.0 - 100.0 ug/mL    [facility]    Physical Findings: AIMS: Facial and Oral Movements Muscles of Facial Expression: None, normal Lips and  Perioral Area: None, normal Jaw: None, normal Tongue: None, normal,Extremity Movements Upper (arms, wrists, hands, fingers): None, normal Lower (legs, knees, ankles, toes): None, normal, Trunk Movements Neck, shoulders, hips: None, normal, Overall Severity Severity of abnormal movements (highest score from questions above): None, normal Incapacitation due to abnormal movements: None, normal Patient's awareness of abnormal movements (rate only patient's report): No Awareness, Dental Status Current problems with teeth and/or dentures?: No Does patient usually wear dentures?:  No  CIWA:  CIWA-Ar Total: 2 COWS:  COWS Total Score: 1   Assessment: Still confused, cognition seems some poor. She is paranoid today, expressing delusional ideations of being in danger of bodily harm due to some people shooting through the window in her room. Tolerating medications well. Will continue current treatment regimen  Treatment Plan Summary: Daily contact with patient to assess and evaluate symptoms and progress in treatment, Medication management and Plan See below. Continue Abilify to 20 mgrs QDAY, Depakote ER   250 mgrs QHS, Trazodone 75 mgrs QHS for insomnia  Review lab, Valproic Acid Serum level at 13.1, low  Of note, patient is wanting discharge soon, although agreeing to remain in hospital " just a little longer for the medications to work".  I have spoken with husband- he will have home health services to help as of Monday. If improves further, consider discharge Monday due increase in paranoia.   Medical Decision Making:  Review of Psycho-Social Stressors (1), Review or order clinical lab tests (1), Review and summation of old records (2), Review of Last Therapy Session (1), Review of Medication Regimen & Side Effects (2) and Review of New Medication or Change in Dosage (2) Problem Points:  Established problem, stable/improving (1), New problem, with no additional work-up planned (3), Review of last  therapy session (1) and Review of psycho-social stressors (1) Data Points:  Review or order clinical lab tests (1) Review or order medicine tests (1) Review of medication regiment & side effects (2) Review of new medications or change in dosage (2)  Sanjuana Kava, PMHNP-BC 10/07/2014, 6:10 PM I personally evaluated the patient with the NP. She is not improving as expected,. Will monitor closely Madie Reno A. Dub Mikes, M.D.

## 2014-10-07 NOTE — [facility] Group Notes (Addendum)
[facility] Group Notes:  (Nursing/MHT/Case Management/Adjunct)  Date:  10/07/2014  Time:  2:39 PM  Type of Therapy:  Psychoeducational Skills  Participation Level:  Minimal  Participation Quality:  Resistant  Affect:  Resistant  Cognitive:  Lacking  Insight:  Lacking  Engagement in Group:  Monopolizing  Modes of Intervention:  Problem-solving  Summary of Progress/Problems: Pt did attend healthy coping skills group, pt reported that a coping skill for her is to work with her animals.   Jacquelyne BalintForrest, Kevan Prouty Shanta 10/07/2014, 2:39 PM

## 2014-10-07 NOTE — [facility] Group Notes (Addendum)
[facility] Group Notes:  (Nursing/MHT/Case Management/Adjunct)  Date:  10/07/2014  Time:  2:37 PM  Type of Therapy:  Nurse Education  Participation Level:  Minimal  Participation Quality:  Monopolizing  Affect:  Resistant  Cognitive:  Disorganized  Insight:  Lacking  Engagement in Group:  Lacking  Modes of Intervention:  Problem-solving  Summary of Progress/Problems: Pt did attend patient self inventory group, she reported her depression as a 20, and her helplessness/hopelessness as a 10. Pt reported being negative SI/HI, no AH/VH noted.   Jacquelyne BalintForrest, Shamonique Battiste Shanta 10/07/2014, 2:37 PM

## 2014-10-07 NOTE — [facility] Group Notes (Signed)
[facility] Group Notes: (Clinical Social Work)   10/07/2014      Type of Therapy:  Group Therapy   Participation Level:  Did Not Attend - was invited by MHT, but would not come   Ambrose MantleMareida Grossman-Orr, LCSW 10/07/2014, 3:50 PM

## 2014-10-07 NOTE — Progress Notes (Signed)
Patient ID: [name], female   DOB: 06/26/1951, 64 y.o.   MRN: [id number]   D: Pt has been very disorganized and confused on the unit today. Pt has also been very paranoid, and continues to think that people are out to get her and that people are trying to hurt her. Pt also reported that bad spirits were everywhere and that she could not get away. Pt was for discharge tomorrow, how patients husband had some concerns regarding her discharge because he fells like the patient has gotten worst. Pts dad was advised that his concerns would be given to the doctor, Dr. Dub MikesLugo was made aware and it was noted that patient would not be discharged over the weekend. Pt reported being negative SI/HI, no AH/VH noted. A: 15 min checks continued for patient safety. R: Pt safety maintained.

## 2014-10-08 DIAGNOSIS — R4189 Other symptoms and signs involving cognitive functions and awareness: Secondary | ICD-10-CM

## 2014-10-08 DIAGNOSIS — R41841 Cognitive communication deficit: Secondary | ICD-10-CM

## 2014-10-08 MED ORDER — LORAZEPAM 1 MG PO TABS
ORAL_TABLET | ORAL | Status: AC
  Filled 2014-10-08: qty 2

## 2014-10-08 MED ORDER — LORAZEPAM 1 MG PO TABS
2.0000 mg | ORAL_TABLET | Freq: Once | ORAL | Status: AC
  Administered 2014-10-08: 2 mg via ORAL

## 2014-10-08 NOTE — [facility] Group Notes (Signed)
[facility] Group Notes:  (Clinical Social Work)  10/08/2014   1:15-2:15PM  Summary of Progress/Problems:  The main focus of today's process group was to   identify the patient's current support system and decide on other supports that can be put in place.  The picture on workbook was used to discuss why additional supports are needed.  An emphasis was placed on using counselor, doctor, therapy groups, 12-step groups, and problem-specific support groups to expand supports.   There was also an extensive discussion about what constitutes a healthy support versus an unhealthy support.  The patient expressed that one current healthy support is her 26yo daughter.  She then digressed into a tangential story which devolved into completely unrelated thoughts, such as knowing her head injury needed to be checked to then talking about not wanting to have her ring finger cut off.  Later she fell asleep.  Type of Therapy:  Process Group  Participation Level:  Minimal  Participation Quality:  Alert  Affect:  Blunted and Depressed  Cognitive:  Confused  Insight:  Lacking  Engagement in Therapy: Limited  Modes of Intervention:  Education,  Support and [employer]Processing  Christiona Siddique Grossman-Orr, LCSW 10/08/2014, 4:00pm

## 2014-10-08 NOTE — Progress Notes (Signed)
Did not attend group 

## 2014-10-08 NOTE — Progress Notes (Signed)
Patient ID: [name], female   DOB: 10/20/1950, 64 y.o.   MRN: [id number]   D: Pt in the dayroom watching tv. Pt continues to be very disorganized on the unit, she continues to be very confused. Pt is on the unit pacing the halls and reporting that she is hearing voices.  A: 1:1 continued for patient safety. R: Pt safety maintained.

## 2014-10-08 NOTE — Plan of Care (Signed)
Problem: Diagnosis: Increased Risk For Suicide Attempt Goal: LTG-Patient Will Report Improvement in Psychotic Symptoms LTG (by discharge) : Patient will report improvement in psychotic symptoms.  Outcome: Not Progressing Patient is paranoid and suspicious of staff and other patients.

## 2014-10-08 NOTE — [facility] Group Notes (Signed)
[facility] Group Notes:  (Nursing/MHT/Case Management/Adjunct)  Date:  10/08/2014  Time:  3:23 PM  Type of Therapy: Nurse Education  Participation Level: Minimal  Participation Quality: Monopolizing  Affect: Resistant  Cognitive: Disorganized  Insight: Lacking  Engagement in Group: Lacking  Modes of Intervention: Problem-solving  Summary of Progress/Problems: Pt did attend patient self inventory group, she reported her depression as a 10, and her helplessness/hopelessness as a 0. Pt reported being negative SI/HI, no AH/VH noted.    Jacquelyne BalintForrest, Jerri Hargadon Shanta 10/08/2014, 3:23 PM

## 2014-10-08 NOTE — Progress Notes (Signed)
Writer has observed patient wandering into other patients rooms on several occasions and had to be redirected to her room. She is very paranoid and feel that someone is going to harm her family. She remains disorganized in her thinking and suspicious of other patients on the unit.  Her husband visited and attempted to get her to eat some of her dinner but she only ate a few bites. He is concerned about her discharging on tomorrow and would like to speak with her doctor. Writer reassured patient that she was safe while here. Safety maintained on unit with 15 min checks.

## 2014-10-08 NOTE — Progress Notes (Signed)
Patient ID: Diane Richardson, female   DOB: 08/31/1951, 64 y.o.   MRN: [id number]   1:1 NURSING NOTE  D: Pt continues to be very disorganized on the unit, she continues to be very confused. Pt is on the unit pacing the halls and reporting that she is hearing voices. Pt was given a one time does of Ativan due to increased anxiety and increased agitation. Pt took medication without any problems. A: 1:1 continued for patient safety. R: Pt safety maintained.

## 2014-10-08 NOTE — [facility] Group Notes (Signed)
[facility] Group Notes:  (Nursing/MHT/Case Management/Adjunct)  Date:  10/08/2014  Time:  3:12 PM  Type of Therapy:  Psychoeducational Skills  Participation Level:  Minimal  Participation Quality:  Intrusive  Affect:  Resistant  Cognitive:  Disorganized  Insight:  Lacking  Engagement in Group:  Monopolizing and Resistant  Modes of Intervention:  Problem-solving  Summary of Progress/Problems: Pt attended healthy support systems group.   Jacquelyne BalintForrest, Daved Mcfann Shanta 10/08/2014, 3:12 PM

## 2014-10-08 NOTE — Progress Notes (Signed)
Patient ID: Diane Richardson, female   DOB: 10/10/1950, 64 y.o.   MRN: [id number]   D: Pt has been very disorganized and confused on the unit today. Pt has also been very paranoid, thinking people are after her. Pt was found wondering in other patients rooms, and has been requiring frequent redirection. Pt has had very poor ADL's, and has not been eating. Pt has to encouraged to eat and to do ADL's. Dr. Dub MikesLugo was contacted, orders noted to start patient on a 1:1. Pt reported being negative SI/HI, no AH/VH noted. A: Pt on 1:1 for safety. R: Pt safety maintained.

## 2014-10-08 NOTE — Progress Notes (Addendum)
1:1 note-    Writer has observed patient up in her room earlier visiting with her husband. She attended group this evening and ate a snack after group. She continues to have disorganized thinking and is on 1:1 for safety. She denies having pain. Patient is safe and 1:1 continues.

## 2014-10-08 NOTE — Progress Notes (Signed)
Patient ID: Diane Richardson, female   DOB: 10/21/1950, 64 y.o.   MRN: [id number] Patient ID: Diane Richardson, female   DOB: 05/26/1951, 64 y.o.   MRN: [id number] Patient ID: Diane Richardson, female   DOB: 07/12/1951, 64 y.o.   MR[facility] [id number] [facility]  10/08/2014 2:46 PM Diane LuxChristie M Bouknight  MRN:  [id number]   Subjective:  Diane Richardson reports, "I'm worried about my husband. I have not seen him today, but he came by last night, but with group of people who buy contraband stuff"  Objective: Patient was seen, chart reviewed. Diane Richardson was assessed in her room. She remains on 1:1 supervision for safety. He continues to present with paranoia & suspiciousness. She says when she gets like this, only Ativan relaxes her.  Principal Problem: Cognitive Decline with psychotic symptoms/ Mood outbursts.  Diagnosis:   Patient Active Problem List   Diagnosis Date Noted  . Bipolar affective disorder, current episode hypomanic [F31.0] 09/29/2014  . Suicidal ideations [R45.851] 09/29/2014  . Depression [F32.9] 09/29/2014  . Hallucination [R44.3]   . Frequent falls [R29.6] 08/29/2014  . ADD (attention deficit disorder) [F90.9] 08/29/2014  . Anxiety [F41.9] 08/29/2014  . H/O deep venous thrombosis [Z86.718] 08/29/2014  . Healed or old pulmonary embolism [Z86.711] 08/29/2014  . 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 General psychoses [F29] 07/24/2014  . Paranoia [F22]   . Subacute confusional state [F05] 02/07/2013   Total Time spent with patient: 30 minutes   Past Medical History:  Past Medical History  Diagnosis Date  . Fibromyalgia   . PTSD (post-traumatic stress disorder)   . Anxiety   . Depression   . DVT (deep venous thrombosis)   . PE (pulmonary embolism)   . ADD (attention deficit disorder)   . Muscle spasm     bad  . Fatigue     chronic    Past Surgical History  Procedure Laterality Date  . Childbirth  1989    x1,NVD  . Pulmonary embolism surgery  1983    x9 days    Family History:  Family History  Problem Relation Age of Onset  . CAD Father   . Hyperlipidemia Father   . Hypertension Father   . Hyperlipidemia Brother   . Diabetes Maternal Grandmother   . Cancer Maternal Grandmother     stomach  . Dementia Maternal Grandfather   . Colon polyps Maternal Grandfather   . CAD Paternal Grandmother   . Diabetes Paternal Grandmother   . Dementia Paternal Grandfather    Social History:  History  Alcohol Use  . Yes    Comment: 3 glasses of wine weekly     History  Drug Use No    History   Social History  . Marital Status: Married    Spouse Name: N/A    Number of Children: 1  . Years of Education: PHD   Occupational History  . disabled     FORMER TEACHER   Social History Main Topics  . Smoking status: Never Smoker   . Smokeless tobacco: Never Used  . Alcohol Use: Yes     Comment: 3 glasses of wine weekly  . Drug Use: No  . Sexual Activity: Yes   Other Topics Concern  . None   Social History Narrative   Additional History:    Sleep: Fair  Appetite:  Fair  Assessment:   Musculoskeletal: Strength & Muscle Tone: within normal limits Gait & Station: normal Patient leans: N/A  Psychiatric Specialty Exam:  Physical Exam  ROS- has denied headache, no chest pain, no shortness of breath, no abdominal pain, no peripheral edema, no rash. No fever, no chills, no akatisia or abnormal involuntary movements noted .  Blood pressure 132/118, pulse 102, temperature 98.2 F (36.8 C), temperature source Oral, resp. rate 18, height  (1.549 m), weight 55.792 kg (123 lb), SpO2 100 %.Body mass index is 23.25 kg/(m^2).  General Appearance: improved grooming  Eye Contact::  Good  Speech:  Normal Rate   Volume:  Normal  Mood:  Improved, affect more reactive   Affect:  Fearful,    Thought Process:  Linear and but slowed   Orientation:  Other:  partially oriented to place and time  Thought Content:  Paranoid Ideation- no  hallucinations- more delusional ideations.  Suicidal Thoughts:  No denies any plan or intention of hurting self or anyone else at this time.  Homicidal Thoughts:  No  Memory: recent and remote poor to fair. Recall was 3/3 immediate, and 1/3 at 3 minutes   Judgement:  Poor  Insight:  Lacking  Psychomotor Activity:  improving  Concentration:  Fair  Recall:  [employer] of Knowledge:Poor  Language: Fair  Akathisia:  No  Handed:  Right  AIMS (if indicated):     Assets:  Housing Social Support  ADL's:  Intact  Cognition: WNL and Impaired,  Moderate  Sleep:  Number of Hours: 6.75     Current Medications: Current Facility-Administered Medications  Medication Dose Route Frequency Provider Last Rate Last Dose  . acetaminophen (TYLENOL) tablet 650 mg  650 mg Oral Q6H PRN Lindwood Qua, NP      . alum & mag hydroxide-simeth (MAALOX/MYLANTA) 200-200-20 MG/5ML suspension 30 mL  30 mL Oral Q4H PRN Lindwood Qua, NP      . ARIPiprazole (ABILIFY) tablet 20 mg  20 mg Oral Daily Craige Cotta, MD   20 mg at 10/08/14 0818  . divalproex (DEPAKOTE ER) 24 hr tablet 250 mg  250 mg Oral QHS Craige Cotta, MD   250 mg at 10/07/14 2035  . ibuprofen (ADVIL,MOTRIN) tablet 600 mg  600 mg Oral Q6H PRN Kristeen Mans, NP   600 mg at 10/04/14 1711  . magnesium hydroxide (MILK OF MAGNESIA) suspension 30 mL  30 mL Oral Daily PRN Velna Hatchet May Agustin, NP      . neomycin-bacitracin-polymyxin (NEOSPORIN) ointment   Topical TID PRN Rachael Fee, MD      . traZODone (DESYREL) tablet 75 mg  75 mg Oral QHS Craige Cotta, MD   75 mg at 10/06/14 2055    Lab Results:  Results for orders placed or performed during the hospital encounter of 09/29/14 (from the past 48 hour(s))  Valproic acid level     Status: Abnormal   Collection Time: 10/06/14  7:41 PM  Result Value Ref Range   Valproic Acid Lvl 13.1 (L) 50.0 - 100.0 ug/mL    Comment: Performed at [facility]    Physical Findings: AIMS:  Facial and Oral Movements Muscles of Facial Expression: None, normal Lips and Perioral Area: None, normal Jaw: None, normal Tongue: None, normal,Extremity Movements Upper (arms, wrists, hands, fingers): None, normal Lower (legs, knees, ankles, toes): None, normal, Trunk Movements Neck, shoulders, hips: None, normal, Overall Severity Severity of abnormal movements (highest score from questions above): None, normal Incapacitation due to abnormal movements: None, normal Patient's awareness of abnormal movements (rate only patient's report): No Awareness, Dental Status Current problems  with teeth and/or dentures?: No Does patient usually wear dentures?: No  CIWA:  CIWA-Ar Total: 2 COWS:  COWS Total Score: 1   Assessment: Still confused, cognition seems some poor. She is paranoid today, expressing delusional ideations of being in danger of bodily harm due to some people shooting through the window in her room. Tolerating medications well. Will continue current treatment regimen  Treatment Plan Summary: Daily contact with patient to assess and evaluate symptoms and progress in treatment, Medication management and Plan See below. Continue Abilify to 20 mgrs QDAY, Depakote ER   250 mgrs QHS, Trazodone 75 mgrs QHS for insomnia  Review lab, Valproic Acid Serum level at 13.1, low  Of note, patient is wanting discharge soon, although agreeing to remain in hospital " just a little longer for the medications to work".  The Md had spoken with husband- he will have home health services to help as of Monday. If improves further, consider discharge Monday..   Medical Decision Making:  Review of Psycho-Social Stressors (1), Review or order clinical lab tests (1), Review and summation of old records (2), Review of Last Therapy Session (1), Review of Medication Regimen & Side Effects (2) and Review of New Medication or Change in Dosage (2) Problem Points:  Established problem, stable/improving (1), New  problem, with no additional work-up planned (3), Review of last therapy session (1) and Review of psycho-social stressors (1) Data Points:  Review or order clinical lab tests (1) Review or order medicine tests (1) Review of medication regiment & side effects (2) Review of new medications or change in dosage (2)  Sanjuana Kava, PMHNP-BC 10/08/2014, 2:46 PM I personally evaluated the patient. She continues to exhibit some psychotic symptoms. Will pursue the Abilify and the Depakote and use the Ativan as needed. She is not ready for D/C as planned. Will place on 1:1 OBS due to her wandering into other patients rooms and needing re direction Marcelle Hepner A. Dub Mikes, M.D.

## 2014-10-09 MED ORDER — ARIPIPRAZOLE 15 MG PO TABS
30.0000 mg | ORAL_TABLET | Freq: Every day | ORAL | Status: DC
  Administered 2014-10-10 – 2014-10-12 (×3): 30 mg via ORAL
  Filled 2014-10-09: qty 28
  Filled 2014-10-09 (×5): qty 2

## 2014-10-09 MED ORDER — TRAZODONE HCL 50 MG PO TABS
50.0000 mg | ORAL_TABLET | Freq: Every evening | ORAL | Status: DC | PRN
  Filled 2014-10-09: qty 14

## 2014-10-09 MED ORDER — LORAZEPAM 0.5 MG PO TABS
0.5000 mg | ORAL_TABLET | Freq: Two times a day (BID) | ORAL | Status: DC
  Administered 2014-10-09 – 2014-10-12 (×6): 0.5 mg via ORAL
  Filled 2014-10-09 (×6): qty 1

## 2014-10-09 MED ORDER — DIVALPROEX SODIUM ER 500 MG PO TB24
500.0000 mg | ORAL_TABLET | Freq: Every day | ORAL | Status: DC
  Administered 2014-10-10 – 2014-10-11 (×2): 500 mg via ORAL
  Filled 2014-10-09 (×2): qty 1
  Filled 2014-10-09: qty 14
  Filled 2014-10-09 (×2): qty 1

## 2014-10-09 MED ORDER — LORAZEPAM 0.5 MG PO TABS
0.5000 mg | ORAL_TABLET | Freq: Three times a day (TID) | ORAL | Status: DC | PRN
  Administered 2014-10-09 – 2014-10-11 (×4): 0.5 mg via ORAL
  Filled 2014-10-09 (×4): qty 1

## 2014-10-09 NOTE — Progress Notes (Signed)
Patient ID: Diane Richardson, female   DOB: 04/26/1951, 64 y.o.   MRN: [id number]  1:1 Nursing Note-  Patient is laying her bed at this time. Writer notified patient that her husband called and asked about her. Patient was encouraged to eat and writer inquired why patient was not eating. "I feel scared." Patient reports it takes her awhile to feel comfortable in a new place and she has no appetite. Patient reported she has been attending groups and, "I've had a pretty good day so far." MHT is within reach and 1:1 is continued for safety. Q15 minute safety checks are maintained as well.

## 2014-10-09 NOTE — Progress Notes (Signed)
Patient ID: Diane Richardson, female   DOB: 1951/02/19, 64 y.o.   MRN: [id number] [facility] MD Progress Note  10/09/2014 3:30 PM ADRIENA MANFRE  MRN:  [id number]   Subjective: Patient reports she is " scared" but cannot specify why.  Objective:  I have discussed case with treatment team and with Dr. Sabra Heck , who followed her over weekend. I have also met with patient. Patient has worsened, and as per team has been more psychotic, more fearful, and has been placed on 1:1 monitoring . Patient presents alert, attentive, but today is confused and states she is at " [facility]" and is unsure of date, day of week.  She ruminates about " people here that I have met before, maybe in high school", but cannot specify further. She does continue to report that she feels she is in danger and alludes to some conspiracy. She does not appear internally preoccupied and does not endorse hallucinations. She denies medication side effects. States she feels Ativan " is what really works for me"  With her express consent I have spoken with Dr. Virgilio Belling , her outpatient Neurologist , Dr. Toy Care, her outpatient Psychiatrist, and today with Dr. Tamala Julian, her long time PCP. There is agreement that there is likely and underlying neurologic , cognitive disorder, which may be contributing to her delusions and functional decline. As noted, patient does have  A history of several head traumas over recent months to years  Related to falling off horses . Patient responds fairly well to support, encouragement, reorientation, reassurance. She did seem to calm down significantly during session. Valproic Acid Serum level was low/subtherapeutic.  Principal Problem: Cognitive Decline with psychotic symptoms/ Mood outbursts.  Diagnosis:   Patient Active Problem List   Diagnosis Date Noted  . Acute cognitive decline [R41.841]   . Bipolar affective disorder, current episode hypomanic [F31.0] 09/29/2014  . Suicidal ideations [R45.851]  09/29/2014  . Depression [F32.9] 09/29/2014  . Hallucination [R44.3]   . Frequent falls [R29.6] 08/29/2014  . ADD (attention deficit disorder) [F90.9] 08/29/2014  . Anxiety [F41.9] 08/29/2014  . H/O deep venous thrombosis [Z86.718] 08/29/2014  . Healed or old pulmonary embolism [Z86.711] 08/29/2014  . 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 General psychoses [F29] 07/24/2014  . Paranoia [F22]   . Subacute confusional state [F05] 02/07/2013   Total Time spent with patient: 30 minutes   Past Medical History:  Past Medical History  Diagnosis Date  . Fibromyalgia   . PTSD (post-traumatic stress disorder)   . Anxiety   . Depression   . DVT (deep venous thrombosis)   . PE (pulmonary embolism)   . ADD (attention deficit disorder)   . Muscle spasm     bad  . Fatigue     chronic    Past Surgical History  Procedure Laterality Date  . Childbirth  1989    x1,NVD  . Pulmonary embolism surgery  1983    x9 days   Family History:  Family History  Problem Relation Age of Onset  . CAD Father   . Hyperlipidemia Father   . Hypertension Father   . Hyperlipidemia Brother   . Diabetes Maternal Grandmother   . Cancer Maternal Grandmother     stomach  . Dementia Maternal Grandfather   . Colon polyps Maternal Grandfather   . CAD Paternal Grandmother   . Diabetes Paternal Grandmother   . Dementia Paternal Grandfather    Social History:  History  Alcohol Use  . Yes  Comment: 3 glasses of wine weekly     History  Drug Use No    History   Social History  . Marital Status: Married    Spouse Name: N/A    Number of Children: 1  . Years of Education: PHD   Occupational History  . disabled     FORMER TEACHER   Social History Main Topics  . Smoking status: Never Smoker   . Smokeless tobacco: Never Used  . Alcohol Use: Yes     Comment: 3 glasses of wine weekly  . Drug Use: No  . Sexual Activity: Yes   Other Topics Concern  . None   Social History Narrative    Additional History:    Sleep: improved   Appetite:  Fair  Assessment:   Musculoskeletal: Strength & Muscle Tone: within normal limits Gait & Station: normal Patient leans: N/A  Psychiatric Specialty Exam: Physical Exam  ROS- has denied headache, no chest pain, no shortness of breath, no abdominal pain, no peripheral edema, no rash. No fever, no chills, no akatisia or abnormal involuntary movements noted .  Blood pressure 128/64, pulse 80, temperature 98.3 F (36.8 C), temperature source Oral, resp. rate 18, height 5' 1"  (1.549 m), weight 123 lb (55.792 kg), SpO2 100 %.Body mass index is 23.25 kg/(m^2).  General Appearance: Fairly Groomed  Engineer, water::  Good  Speech:  Normal Rate   Volume:  Normal  Mood:  Anxious, Fearful   Affect:  Fearful,    Thought Process:  Linear and but slowed   Orientation:  Other:  partially oriented to place and time  Thought Content:  Paranoid Ideation- no hallucinations- more delusional ideations.  Suicidal Thoughts:  No denies any plan or intention of hurting self or anyone else at this time.  Homicidal Thoughts:  No  Memory: recent and remote  Fair   Judgement:  Poor  Insight:  Lacking  Psychomotor Activity:  Decreased  Concentration:  Fair  Recall:  [employer] of Knowledge:Poor  Language: Fair  Akathisia:  No  Handed:  Right  AIMS (if indicated):     Assets:  Housing Social Support  ADL's:  Intact  Cognition: WNL and Impaired,  Moderate  Sleep:  Number of Hours: 6.75     Current Medications: Current Facility-Administered Medications  Medication Dose Route Frequency Provider Last Rate Last Dose  . acetaminophen (TYLENOL) tablet 650 mg  650 mg Oral Q6H PRN Janett Labella, NP      . alum & mag hydroxide-simeth (MAALOX/MYLANTA) 200-200-20 MG/5ML suspension 30 mL  30 mL Oral Q4H PRN Janett Labella, NP      . ARIPiprazole (ABILIFY) tablet 20 mg  20 mg Oral Daily Jenne Campus, MD   20 mg at 10/09/14 0759  . divalproex  (DEPAKOTE ER) 24 hr tablet 250 mg  250 mg Oral QHS Jenne Campus, MD   250 mg at 10/08/14 2149  . ibuprofen (ADVIL,MOTRIN) tablet 600 mg  600 mg Oral Q6H PRN Lurena Nida, NP   600 mg at 10/04/14 1711  . LORazepam (ATIVAN) tablet 0.5 mg  0.5 mg Oral Q8H PRN Jenne Campus, MD   0.5 mg at 10/09/14 1238  . magnesium hydroxide (MILK OF MAGNESIA) suspension 30 mL  30 mL Oral Daily PRN Freda Munro May Agustin, NP      . neomycin-bacitracin-polymyxin (NEOSPORIN) ointment   Topical TID PRN Nicholaus Bloom, MD      . traZODone (DESYREL) tablet 75 mg  75 mg Oral QHS Jenne Campus, MD   75 mg at 10/08/14 2148    Lab Results:  No results found for this or any previous visit (from the past 28 hour(s)).  Physical Findings: AIMS: Facial and Oral Movements Muscles of Facial Expression: None, normal Lips and Perioral Area: None, normal Jaw: None, normal Tongue: None, normal,Extremity Movements Upper (arms, wrists, hands, fingers): None, normal Lower (legs, knees, ankles, toes): None, normal, Trunk Movements Neck, shoulders, hips: None, normal, Overall Severity Severity of abnormal movements (highest score from questions above): None, normal Incapacitation due to abnormal movements: None, normal Patient's awareness of abnormal movements (rate only patient's report): No Awareness, Dental Status Current problems with teeth and/or dentures?: No Does patient usually wear dentures?: No  CIWA:  CIWA-Ar Total: 2 COWS:  COWS Total Score: 1   Assessment: Patient presents with worsening delusions, although has difficulty describing them. She presents fearful. She is confused, but alert and attentive, and does not appear  Delirious at this time. Medications had been tapered to minimize cognitive side effects, but she seems to be doing worse at lower doses, so will titrate .   Treatment Plan Summary: Daily contact with patient to assess and evaluate symptoms and progress in treatment, Medication management and  Plan See below. Increase Abilify to 30 mgrs QDAY Increase Depakote ER    To 500  mgrs QHS Start Ativan 0.5 mgrs BID in addition to PRNs  For agitation   Medical Decision Making:  Review of Psycho-Social Stressors (1), Review or order clinical lab tests (1), Established Problem, Worsening (2), Review of Last Therapy Session (1), Review of Medication Regimen & Side Effects (2) and Review of New Medication or Change in Dosage (2) Problem Points:  Established problem, worsening (2), Review of last therapy session (1) and Review of psycho-social stressors (1) Data Points:  Review or order clinical lab tests (1) Review of medication regiment & side effects (2) Review of new medications or change in dosage (2)  COBOS, FERNANDO,  10/09/2014, 3:30 PM

## 2014-10-09 NOTE — [facility] Group Notes (Signed)
[facility] LCSW Group Therapy  10/09/2014 1:15 pm  Type of Therapy: Process Group Therapy  Participation Level:  Active  Participation Quality:  Appropriate  Affect:  Flat  Cognitive:  Oriented  Insight:  Improving  Engagement in Group:  Limited  Engagement in Therapy:  Limited  Modes of Intervention:  Activity, Clarification, Education, Problem-solving and Support  Summary of Progress/Problems: Today's group addressed the issue of overcoming obstacles.  Patients were asked to identify their biggest obstacle post d/c that stands in the way of their on-going success, and then problem solve as to how to manage this.  Pt was present for about half of the group.  Difficult time tracking.  When I told her that her biggest obstacle seemed to be getting out of the fog, she agreed.  "I'm tired of being in the fog."  Diane Richardson, Diane Richardson 10/09/2014   3:18 PM

## 2014-10-09 NOTE — [facility] Group Notes (Signed)
[facility]   10/09/2014 10:38 AM  Participation Quality:  Pt invited to group. Sat for about 3 minutes and left room before she could be checked in with by CSW. Pt continues to present with disorganized thought process and paranoia.   Smart, American FinancialHeather LCSWA

## 2014-10-09 NOTE — Progress Notes (Signed)
1:1 Nursing Note  Patient is seen in her bed eating lunch at this time. Patient received PRN Ativan for anxiety and agitation. Patient took medication without complication. MHT is within reach and 1:1 is continued for safety. Q15 minute safety checks maintained as well.

## 2014-10-09 NOTE — Plan of Care (Signed)
Problem: Ineffective individual coping Goal: STG: Patient will remain free from self harm Outcome: Completed/Met Date Met:  10/09/14 Patient is free from self harm as evidenced by Q15 minute safety checks and 1:1 observation.

## 2014-10-09 NOTE — Progress Notes (Signed)
1:1 note - Patient is currently lying in bed asleep with eyes closed and respirations even and unlabored, no distress noted. 1:1 continues and patient is safe.

## 2014-10-09 NOTE — Progress Notes (Signed)
[facility] Group Notes:  (Nursing/MHT/Case Management/Adjunct)  Date:  10/09/2014  Time:  12:06 AM  Type of Therapy:  Group Therapy  Participation Level:  Minimal  Participation Quality:  Appropriate  Affect:  Confused  Cognitive:  Confused and Lacking  Insight:  Limited  Engagement in Group:  Developing/Improving and Off Topic  Modes of Intervention:  Socialization and Support  Summary of Progress/Problems: Pt. Participated in group discussion, but insight was off-topic.  Sondra ComeWilson, Regina Coppolino J 10/09/2014, 12:06 AM

## 2014-10-09 NOTE — Progress Notes (Signed)
1:1 Nursing note- Patient currently lying in bed asleep with eyes closed and respirations even and unlabored. 1:1 continues and patient remains safe.

## 2014-10-09 NOTE — Progress Notes (Signed)
Patient ID: Diane Richardson, female   DOB: 04/11/1951, 64 y.o.   MRN: [id number]  1:1 Nursing Note-   Patient is seen in her bedroom sitting in her bed. Patient reports, "sometimes this [referring to Abilify] makes me sleepy." Patient is in no distress at this time. Patient denies SI/HI and A/V hallucinations. Patient reports no pain at this time. Patient's goal for the day is to, "pray for God's guidance." Patient reports sleeping good last night, appetite is poor, energy level is low, and concentration is low. MHT is within reach and 1:1 is continued for safety. Q15 minute safety checks maintained as well.

## 2014-10-09 NOTE — Progress Notes (Signed)
Patient 1:1 note Patient in her room on approach.  Patient speech tangential and soft.  Patient did states she thinks she had a head injury from falling off her horse.  Patient states she does not rememeber when she fell.  Patient is calm at this time.  Patient denies SI/HI and denies AVH.  Patient 1:1 remains for safety.

## 2014-10-10 DIAGNOSIS — F99 Mental disorder, not otherwise specified: Secondary | ICD-10-CM

## 2014-10-10 DIAGNOSIS — F1310 Sedative, hypnotic or anxiolytic abuse, uncomplicated: Secondary | ICD-10-CM | POA: Diagnosis present

## 2014-10-10 DIAGNOSIS — F419 Anxiety disorder, unspecified: Secondary | ICD-10-CM

## 2014-10-10 DIAGNOSIS — G3184 Mild cognitive impairment, so stated: Secondary | ICD-10-CM

## 2014-10-10 NOTE — Progress Notes (Signed)
1:1 NOTE D: Patient sitting in group therapy at this time. Flat affect observed by RN. Pt is participating and engaged. Pt appears to be in no distress. A: RN with pt on 1:1 status. 15 minute checks continued per protocol for patient safety.  R: Pt remains safe.

## 2014-10-10 NOTE — Progress Notes (Signed)
D: Patient resting in bed with eyes closed.  Respirations even and unlabored.  Patient appears to be in no apparent distress.  Patient remains on 1:1 for safety A: Staff to monitor Q 15 mins for safety.   R:Patient remains safe on the unit.  

## 2014-10-10 NOTE — Progress Notes (Signed)
The focus of this group is to help patients review their daily goal of treatment and discuss progress on daily workbooks. Pt attended the evening group session but responded minimally to discussion prompts from the Writer. Pt shared that today was a good day, the highlight of which was getting to talk to her daughter. Diane Richardson also shared that she looked forward to discharging so that she could see the family dog again, whom she has been missing. Pt reported having no additional needs from Nursing Staff this evening. Pt's affect was flat.

## 2014-10-10 NOTE — Progress Notes (Signed)
1:1 NOTE D: Patient resting in bed at this time. Pt observed to be in no distress. Pt denies concerns at this time. Pt showered this afternoon. A: 1:1 status continued, RN with pt. 15 minute checks continued per protocol for patient safety.  R: Patient cooperative and receptive to nursing interventions. Pt remains safe.

## 2014-10-10 NOTE — Progress Notes (Signed)
Patient ID: Diane Richardson, female   DOB: 13-Nov-1950, 64 y.o.   MRN: [id number] [facility] MD Progress Note  10/10/2014 2:00 PM Diane Richardson  MRN:  [id number]   Subjective: Patient reports "I feel better today."  Objective:  I have discussed case with treatment team and with Dr. Tiajuana Amass.  I have also met with patient. Patient today seems to be having a better day. Patient had  Worsened over the weekend and had became fearful, psychotic and was placed on 1:1 monitoring . Patient today is more alert , oriented to place ,situation as well as the time , reports the month as February and day as 2nd. Patient was able to provide some hx about her previous head injury as well as medications. Patient denies any AH/VH or paranoia today. Discussed with patient that we could keep her medications the same except for her Bzd . Discussed that BZD can cause cognitive issues and could worsen her sx. Discussed alternative treatment. However patient is not willing to make any changes today. Pt would like to stay on BZD ,since that is the only medication that will help her. Pt understands the risks associated with it including memory issues , confusion ,fall risk and so on. This was also discussed with patient's husband 'Diane Richardson " . Patient to follow up with outpt psychiatrist who could taper her off of it. Husband voices concern about support system once discharged . CSW to work on disposition.  Per Dr. Tiajuana Amass notes in EHR from 10/09/14 : With her express consent I have spoken with Dr. Virgilio Belling , her outpatient Neurologist , Dr. Toy Care, her outpatient Psychiatrist, and today with Dr. Tamala Julian, her long time PCP. There is agreement that there is likely and underlying neurologic , cognitive disorder, which may be contributing to her delusions and functional decline. As noted, patient does have  A history of several head traumas over recent months to years  Related to falling off horses .      Principal Problem: Cognitive Decline  with psychotic symptoms/ Mood outbursts.  Diagnosis:   Patient Active Problem List   Diagnosis Date Noted  . 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 Anxiety disorder [F41.9] 10/10/2014  . Anxiety [F41.9] 08/29/2014  . H/O deep venous thrombosis [Z86.718] 08/29/2014  . Healed or old pulmonary embolism [Z86.711] 08/29/2014  . Paranoia [F22]    Total Time spent with patient: 30 minutes   Past Medical History:  Past Medical History  Diagnosis Date  . Fibromyalgia   . PTSD (post-traumatic stress disorder)   . Anxiety   . Depression   . DVT (deep venous thrombosis)   . PE (pulmonary embolism)   . ADD (attention deficit disorder)   . Muscle spasm     bad  . Fatigue     chronic    Past Surgical History  Procedure Laterality Date  . Childbirth  1989    x1,NVD  . Pulmonary embolism surgery  1983    x9 days   Family History:  Family History  Problem Relation Age of Onset  . CAD Father   . Hyperlipidemia Father   . Hypertension Father   . Hyperlipidemia Brother   . Diabetes Maternal Grandmother   . Cancer Maternal Grandmother     stomach  . Dementia Maternal Grandfather   . Colon polyps Maternal Grandfather   . CAD Paternal Grandmother   . Diabetes Paternal Grandmother   . Dementia Paternal Grandfather  Social History:  History  Alcohol Use  . Yes    Comment: 3 glasses of wine weekly     History  Drug Use No    History   Social History  . Marital Status: Married    Spouse Name: N/A    Number of Children: 1  . Years of Education: PHD   Occupational History  . disabled     FORMER TEACHER   Social History Main Topics  . Smoking status: Never Smoker   . Smokeless tobacco: Never Used  . Alcohol Use: Yes     Comment: 3 glasses of wine weekly  . Drug Use: No  . Sexual Activity: Yes   Other Topics Concern  . None   Social History Narrative   Additional History:    Sleep: improved    Appetite:  Fair    Musculoskeletal: Strength & Muscle Tone: within normal limits Gait & Station: normal Patient leans: N/A  Psychiatric Specialty Exam: Physical Exam  ROS- has denied headache, no chest pain, no shortness of breath, no abdominal pain, no peripheral edema, no rash. No fever, no chills, no akatisia or abnormal involuntary movements noted .  Blood pressure 125/62, pulse 86, temperature 97.6 F (36.4 C), temperature source Oral, resp. rate 16, height 5' 1"  (1.549 m), weight 55.792 kg (123 lb), SpO2 100 %.Body mass index is 23.25 kg/(m^2).  General Appearance: Fairly Groomed  Engineer, water::  Good  Speech:  Normal Rate   Volume:  Normal  Mood:  Anxious - improving  Affect:  Congruent,    Thought Process:  Linear and but slowed   Orientation:  Full (Time, Place, and Person)  Thought Content:  Rumination and denies any paranoia ,delusions  Suicidal Thoughts:  No denies any plan or intention of hurting self or anyone else at this time.  Homicidal Thoughts:  No  Memory: recent and remote  Fair   Judgement:  Poor  Insight:  Lacking  Psychomotor Activity:  Decreased  Concentration:  Fair  Recall:  [employer] of Knowledge:Poor  Language: Fair  Akathisia:  No  Handed:  Right  AIMS (if indicated):     Assets:  Housing Social Support  ADL's:  Intact  Cognition: Impaired,  Mild  Sleep:  Number of Hours: 6.75     Current Medications: Current Facility-Administered Medications  Medication Dose Route Frequency Provider Last Rate Last Dose  . acetaminophen (TYLENOL) tablet 650 mg  650 mg Oral Q6H PRN Janett Labella, NP      . alum & mag hydroxide-simeth (MAALOX/MYLANTA) 200-200-20 MG/5ML suspension 30 mL  30 mL Oral Q4H PRN Janett Labella, NP      . ARIPiprazole (ABILIFY) tablet 30 mg  30 mg Oral Daily Jenne Campus, MD   30 mg at 10/10/14 0755  . divalproex (DEPAKOTE ER) 24 hr tablet 500 mg  500 mg Oral QHS Myer Peer Cobos, MD   500 mg at 10/09/14 2200  .  ibuprofen (ADVIL,MOTRIN) tablet 600 mg  600 mg Oral Q6H PRN Lurena Nida, NP   600 mg at 10/04/14 1711  . LORazepam (ATIVAN) tablet 0.5 mg  0.5 mg Oral Q8H PRN Jenne Campus, MD   0.5 mg at 10/09/14 2149  . LORazepam (ATIVAN) tablet 0.5 mg  0.5 mg Oral BID Jenne Campus, MD   0.5 mg at 10/10/14 0756  . magnesium hydroxide (MILK OF MAGNESIA) suspension 30 mL  30 mL Oral Daily PRN Janett Labella, NP      .  neomycin-bacitracin-polymyxin (NEOSPORIN) ointment   Topical TID PRN Nicholaus Bloom, MD      . traZODone (DESYREL) tablet 50 mg  50 mg Oral QHS PRN Jenne Campus, MD        Lab Results:  No results found for this or any previous visit (from the past 48 hour(s)).  Physical Findings: AIMS: Facial and Oral Movements Muscles of Facial Expression: None, normal Lips and Perioral Area: None, normal Jaw: None, normal Tongue: None, normal,Extremity Movements Upper (arms, wrists, hands, fingers): None, normal Lower (legs, knees, ankles, toes): None, normal, Trunk Movements Neck, shoulders, hips: None, normal, Overall Severity Severity of abnormal movements (highest score from questions above): None, normal Incapacitation due to abnormal movements: None, normal Patient's awareness of abnormal movements (rate only patient's report): No Awareness, Dental Status Current problems with teeth and/or dentures?: No Does patient usually wear dentures?: No  CIWA:  CIWA-Ar Total: 2 COWS:  COWS Total Score: 1   Assessment: Patient presented  with worsening delusions, although has difficulty describing them.Patient had worsening sx yesterday and hence her medications were titrated up and was placed on 1:1 . She seems to be making some progress today.   Treatment Plan Summary: Daily contact with patient to assess and evaluate symptoms and progress in treatment, Medication management and Plan See below. Continue Abilify  30 mg QDAY Continue  Depakote ER  500  mgs QHS Continue  Ativan 0.5 mgrs  BID in addition to PRNs . Discussed the need to taper it off . Pt refuses to start Klonopin which is more long acting and wants to stay on Ativan. Husband is also aware that pt needs to be tapered off of it. Pt reports that Only Ativan helps with her anxiety sx. She was restarted on Ativan By Dr.Cobbos yesterday.  Continue Trazodone 50 mg po qhs. Disposition plan in progress. Plan to DC patient once she is more stable . She is making a lot of progress compared to yesterday. Will continue to monitor.    Medical Decision Making:  Review of Psycho-Social Stressors (1), Review or order clinical lab tests (1), Established Problem, Worsening (2), Review of Last Therapy Session (1), Review of Medication Regimen & Side Effects (2) and Review of New Medication or Change in Dosage (2) Problem Points:  Established problem, worsening (2), Review of last therapy session (1) and Review of psycho-social stressors (1) Data Points:  Review or order clinical lab tests (1) Review of medication regiment & side effects (2)  Ethell Blatchford,  10/10/2014, 2:00 PM

## 2014-10-10 NOTE — Progress Notes (Signed)
D:  Patient did wake up speaking softly stating she did not need anything. Patient remains on 1:1 for safety. A: Staff to monitor Q 15 mins for safety.   R:Patient remains safe on the unit.

## 2014-10-10 NOTE — Progress Notes (Signed)
1:1 NOTE D: Patient ambulating in the hall. Pt denies SI/HI and AVH. Pt's mood is pleasant, affect is flat. Pt rates depression 1/10, hopelessness and anxiety 2/10.  A: RN with pt on 1:1 status. Scheduled medications administered per providers orders (See MAR). 15 minute checks continued per protocol for patient safety.  R: Patient cooperative and receptive to nursing interventions. Pt remains safe

## 2014-10-10 NOTE — [facility] Group Notes (Signed)
[facility] Group Notes:  (Nursing/MHT/Case Management/Adjunct)  Date:  10/10/2014  Time:  0930am  Type of Therapy:  Nurse Education  Participation Level:  Active  Participation Quality:  Appropriate and Attentive  Affect:  Flat  Cognitive:  Appropriate and Disorganized (at times)  Insight:  Good  Engagement in Group:  Engaged  Modes of Intervention:  Discussion, Education and Support  Summary of Progress/Problems: Patient attended group and remained engaged. Pt's comments were disorganized at times but pt was easily redirected. In regards to the daily topic of "recovery" pt states "You're always on Richardson journey."  Diane Richardson, [place] 10/10/2014, 9:47 AM

## 2014-10-10 NOTE — Plan of Care (Signed)
Problem: Diagnosis: Increased Risk For Suicide Attempt Goal: STG-Patient Will Attend All Groups On The Unit Outcome: Progressing Patient has attended all unit groups today, participated, and remained engaged. Goal: STG-Patient Will Comply With Medication Regime Outcome: Progressing Patient has complied with medication regimen today with ease.

## 2014-10-10 NOTE — Progress Notes (Signed)
Pt is confused and disorganized in her thinking   She is pleasant on approach   She continues to need frequent redirection and has poor boundaries   Pt is on a 1:1 and remains free from self harm   She is compliant with medications and treatment

## 2014-10-10 NOTE — [facility] Group Notes (Signed)
.  [facility] LCSW Group Therapy  10/10/2014 , 12:59 PM   Type of Therapy:  Group Therapy  Participation Level:  Active  Participation Quality:  Attentive  Affect:  Appropriate  Cognitive:  Alert  Insight:  Improving  Engagement in Therapy:  Engaged  Modes of Intervention:  Discussion, Exploration and Socialization  Summary of Progress/Problems: Today's group focused on the term Diagnosis.  Participants were asked to define the term, and then pronounce whether it is a negative, positive or neutral term.  Diane Richardson attended and stayed for the entire group.  Did not offer anything spontaneously, but when asked directly, responded.  She did a better job of tracking today, and her comments were related to the subject at hand.  Vague, but interpretable.  Daryel Geraldorth, Diane Richardson 10/10/2014 , 12:59 PM

## 2014-10-11 NOTE — Progress Notes (Signed)
Pt is resting in bed with eyes closed  No distress noted   Pt is on a 1:1 and is presently safe

## 2014-10-11 NOTE — Progress Notes (Signed)
1:1 Nursing Note  D: Patient is sitting in group at this time.  A: Continue to monitor 1:1 and close observation of pt.  R: Remains safe on the unit.

## 2014-10-11 NOTE — Progress Notes (Signed)
Pt is resting in bed with eyes closed   No distress noted   Pt on 1:1 and is presently safe

## 2014-10-11 NOTE — [facility] Group Notes (Signed)
[facility] LCSW Group Therapy  10/11/2014 1:31 PM  Type of Therapy:  Group Therapy  Participation Level:  Minimal  Participation Quality:  Attentive  Affect:  Flat  Cognitive:  Alert  Insight:  Limited  Engagement in Therapy:  Engaged  Modes of Intervention:  Education, Socialization and Support  Summary of Progress/Problems:M[employer] (MHA) speaker came to talk about his personal journey with substance abuse and mental illness. Group members were challenged to process ways by which to relate to the speaker. [name] provided handouts and educational information pertaining to groups and services offered by the [facility]. Lorene DyChristie attended group and stayed the entire time. She sat quietly and listened to the speaker.     Hyatt,Candace 10/11/2014, 1:31 PM

## 2014-10-11 NOTE — [facility] Group Notes (Addendum)
[facility]   10/11/2014 12:25 PM  Participation Quality:  Active   Mood/Affect:  Flat  Depression Rating:  0  Anxiety Rating:  0  Thoughts of Suicide:  No Will you contract for safety?   NA  Current AVH:  No  Plan for Discharge/Comments:  Pt was concerned that she could not stretch on the floor because it is too hard. She likes to stretch when she is at home and would like to while she is here as well.  Goal directed.   Transportation Means: Husband  Supports: Family   Diane Richardson,Diane Richardson

## 2014-10-11 NOTE — Progress Notes (Signed)
1:1 Nursing Note  D: Patient in her room, cleaning and speaking with the RN/MHT; communication is disorganized and mildly confused.  A: Administered scheduled medications and PRN Ibuprofen for headache 5/10. Maintain Q15 minute checks for safety.  R: Patient receptive and easily redirected. Remains safe on the hall.

## 2014-10-11 NOTE — Progress Notes (Addendum)
Patient ID: Diane Richardson, female   DOB: August 27, 1951, 64 y.o.   MRN: [id number] [facility] MD Progress Note  10/11/2014 12:12 PM Diane Richardson  MRN:  [id number]   Subjective: Patient reports "I feel OK."  Objective:  I have discussed case with treatment team .  I have also met with patient. Patient today seems to be more alert than the previous few days. Patient does have periods when she lose track of her thoughts and has intermittent periods of confusion. Patient today appears to be less anxious ,looking forward to discharge.  Patient is oriented to person , place ,situation as well as time. Able to state the date as well as city. Patient denies any AH/VH or paranoia today. Discussed with patient again today that BZD can cause cognitive issues and could worsen her sx. Discussed alternative treatment. However patient is not willing to make any changes today. Pt would like to stay on BZD ,since that is the only medication that will help her. Pt understands the risks associated with it including memory issues , confusion ,fall risk and so on. This was also discussed with patient's husband 'Mike " yesterday. Patient to follow up with outpt psychiatrist who could taper her off of it.  Patient is currently on 1;1 due to periods of confusion as well as fall risk. Husband requests support once patient is discharged. CSW to work on this today. Patient to be discharged tomorrow if she continues to make progress.  Per Dr. Tiajuana Amass notes in EHR from 10/09/14 : With her express consent I have spoken with Dr. Virgilio Belling , her outpatient Neurologist , Dr. Toy Care, her outpatient Psychiatrist, and today with Dr. Tamala Julian, her long time PCP. There is agreement that there is likely and underlying neurologic , cognitive disorder, which may be contributing to her delusions and functional decline. As noted, patient does have  A history of several head traumas over recent months to years  Related to falling off horses  .      Principal Problem: Cognitive Decline with psychotic symptoms/ Mood outbursts.  Diagnosis:   Patient Active Problem List   Diagnosis Date Noted  . 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 Anxiety disorder [F41.9] 10/10/2014  . Anxiety [F41.9] 08/29/2014  . H/O deep venous thrombosis [Z86.718] 08/29/2014  . Healed or old pulmonary embolism [Z86.711] 08/29/2014  . Paranoia [F22]    Total Time spent with patient: 30 minutes   Past Medical History:  Past Medical History  Diagnosis Date  . Fibromyalgia   . PTSD (post-traumatic stress disorder)   . Anxiety   . Depression   . DVT (deep venous thrombosis)   . PE (pulmonary embolism)   . ADD (attention deficit disorder)   . Muscle spasm     bad  . Fatigue     chronic    Past Surgical History  Procedure Laterality Date  . Childbirth  1989    x1,NVD  . Pulmonary embolism surgery  1983    x9 days   Family History:  Family History  Problem Relation Age of Onset  . CAD Father   . Hyperlipidemia Father   . Hypertension Father   . Hyperlipidemia Brother   . Diabetes Maternal Grandmother   . Cancer Maternal Grandmother     stomach  . Dementia Maternal Grandfather   . Colon polyps Maternal Grandfather   . CAD Paternal Grandmother   . Diabetes Paternal Grandmother   . Dementia  Paternal Grandfather    Social History:  History  Alcohol Use  . Yes    Comment: 3 glasses of wine weekly     History  Drug Use No    History   Social History  . Marital Status: Married    Spouse Name: N/A    Number of Children: 1  . Years of Education: PHD   Occupational History  . disabled     FORMER TEACHER   Social History Main Topics  . Smoking status: Never Smoker   . Smokeless tobacco: Never Used  . Alcohol Use: Yes     Comment: 3 glasses of wine weekly  . Drug Use: No  . Sexual Activity: Yes   Other Topics Concern  . None   Social History  Narrative   Additional History:    Sleep: improved   Appetite:  Fair    Musculoskeletal: Strength & Muscle Tone: within normal limits Gait & Station: normal Patient leans: N/A  Psychiatric Specialty Exam: Physical Exam  ROS- has denied headache, no chest pain, no shortness of breath, no abdominal pain, no peripheral edema, no rash. No fever, no chills, no akatisia or abnormal involuntary movements noted .  Blood pressure 98/69, pulse 85, temperature 98.8 F (37.1 C), temperature source Oral, resp. rate 15, height 5' 1"  (1.549 m), weight 55.792 kg (123 lb), SpO2 100 %.Body mass index is 23.25 kg/(m^2).  General Appearance: Fairly Groomed  Engineer, water::  Good  Speech:  Normal Rate   Volume:  Normal  Mood:  Anxious - improving  Affect:  Congruent,    Thought Process:  Linear and but slowed   Orientation:  Full (Time, Place, and Person)  Thought Content:  Rumination and denies any paranoia ,delusions  Suicidal Thoughts:  No denies any plan or intention of hurting self or anyone else at this time.  Homicidal Thoughts:  No  Memory: recent and remote  Fair   Judgement:  Poor  Insight:  Lacking  Psychomotor Activity:  Decreased  Concentration:  Fair  Recall:  [employer] of Knowledge:Poor  Language: Fair  Akathisia:  No  Handed:  Right  AIMS (if indicated):     Assets:  Housing Social Support  ADL's:  Intact  Cognition: Impaired,  Mild  Sleep:  Number of Hours: 6.75     Current Medications: Current Facility-Administered Medications  Medication Dose Route Frequency Provider Last Rate Last Dose  . acetaminophen (TYLENOL) tablet 650 mg  650 mg Oral Q6H PRN Janett Labella, NP      . alum & mag hydroxide-simeth (MAALOX/MYLANTA) 200-200-20 MG/5ML suspension 30 mL  30 mL Oral Q4H PRN Janett Labella, NP      . ARIPiprazole (ABILIFY) tablet 30 mg  30 mg Oral Daily Jenne Campus, MD   30 mg at 10/11/14 4801  . divalproex (DEPAKOTE ER) 24 hr tablet 500 mg  500 mg Oral  QHS Jenne Campus, MD   500 mg at 10/10/14 2123  . ibuprofen (ADVIL,MOTRIN) tablet 600 mg  600 mg Oral Q6H PRN Lurena Nida, NP   600 mg at 10/11/14 0935  . LORazepam (ATIVAN) tablet 0.5 mg  0.5 mg Oral Q8H PRN Jenne Campus, MD   0.5 mg at 10/10/14 2123  . LORazepam (ATIVAN) tablet 0.5 mg  0.5 mg Oral BID Jenne Campus, MD   0.5 mg at 10/11/14 6553  . magnesium hydroxide (MILK OF MAGNESIA) suspension 30 mL  30 mL Oral Daily PRN  Freda Munro May Agustin, NP      . neomycin-bacitracin-polymyxin (NEOSPORIN) ointment   Topical TID PRN Nicholaus Bloom, MD      . traZODone (DESYREL) tablet 50 mg  50 mg Oral QHS PRN Jenne Campus, MD        Lab Results:  No results found for this or any previous visit (from the past 48 hour(s)).  Physical Findings: AIMS: Facial and Oral Movements Muscles of Facial Expression: None, normal Lips and Perioral Area: None, normal Jaw: None, normal Tongue: None, normal,Extremity Movements Upper (arms, wrists, hands, fingers): None, normal Lower (legs, knees, ankles, toes): None, normal, Trunk Movements Neck, shoulders, hips: None, normal, Overall Severity Severity of abnormal movements (highest score from questions above): None, normal Incapacitation due to abnormal movements: None, normal Patient's awareness of abnormal movements (rate only patient's report): No Awareness, Dental Status Current problems with teeth and/or dentures?: No Does patient usually wear dentures?: No  CIWA:  CIWA-Ar Total: 2 COWS:  COWS Total Score: 1   Assessment: Patient presented  with worsening delusions, although has difficulty describing them.Patient had worsening sx over the weekend as well as earlier this week and hence her medications were titrated up and was placed on 1:1 . She seems to be making some progress today.   Treatment Plan Summary: Daily contact with patient to assess and evaluate symptoms and progress in treatment, Medication management and Plan See  below. Continue Abilify  30 mg QDAY Continue  Depakote ER  500  mgs QHS Continue  Ativan 0.5 mgs BID in addition to PRNs . Discussed the need to taper it off . Pt refuses to start Klonopin which is more long acting and wants to stay on Ativan. Husband is also aware that pt needs to be tapered off of it. Pt reports that Only Ativan helps with her anxiety sx. She was restarted on Ativan By Dr.Cobbos who saw her previously. Continue Trazodone 50 mg po qhs. Disposition plan in progress. Plan to DC patient once she is more stable . Will continue to monitor.    Medical Decision Making:  Established Problem, Stable/Improving (1), Review of Psycho-Social Stressors (1), Review of Last Therapy Session (1) and Review of Medication Regimen & Side Effects (2) Problem Points:  Established problem, stable/improving (1), Review of last therapy session (1) and Review of psycho-social stressors (1) Data Points:  Review or order clinical lab tests (1) Review of medication regiment & side effects (2)  I certify that the services received since the previous certification/recertification were and continue to be medically necessary as the treatment provided can be reasonably expected to improve the patient's condition; the medical record documents that the services furnished were intensive treatment services or their equivalent services, and this patient continues to need, on a daily basis, active treatment furnished directly by or requiring the supervision of inpatient psychiatric personnel.       Judy Goodenow, MD 10/11/2014, 12:12 PM

## 2014-10-11 NOTE — Tx Team (Signed)
  Interdisciplinary Treatment Plan Update   Date Reviewed:  10/11/2014  Time Reviewed:  3:09 PM  Progress in Treatment:   Attending groups: Yes Participating in groups: Yes Taking medication as prescribed: Yes  Tolerating medication: Yes Family/Significant other contact made: Yes  Patient understands diagnosis: Yes  Discussing patient identified problems/goals with staff: Yes  See initial care plan Medical problems stabilized or resolved: Yes Denies suicidal/homicidal ideation: Yes  In tx team Patient has not harmed self or others: Yes  For review of initial/current patient goals, please see plan of care.  Estimated Length of Stay:  Likely D/C tomorrow  Reason for Continuation of Hospitalization:   New Problems/Goals identified:  N/A  Discharge Plan or Barriers:   return home with a multitude of services-see Follow up Appointments  Additional Comments:  Attendees:  Signature: Ivin BootySarama Eappen, MD 10/11/2014 3:09 PM   Signature: Richelle Itood Shaquanda Graves, LCSW 10/11/2014 3:09 PM  Signature: Assunta FoundShuvon Rankin, NP 10/11/2014 3:09 PM  Signature: Orlie PollenMarion Friedman, RN 10/11/2014 3:09 PM  Signature:  10/11/2014 3:09 PM  Signature:  10/11/2014 3:09 PM  Signature:   10/11/2014 3:09 PM  Signature:    Signature:    Signature:    Signature:    Signature:    Signature:      Scribe for Treatment Team:   Richelle Itood Winthrop Shannahan, LCSW  10/11/2014 3:09 PM

## 2014-10-11 NOTE — Progress Notes (Signed)
1:1 Nursing Note  D: Patient currently sitting in the dayroom watching television; calm and cooperative.  A: Maintain 1:1 close observation and Q15 minute checks for safety.  R: Receptive and easily redirected; remains safe on the hall.

## 2014-10-12 MED ORDER — COQ-10 50 MG PO CAPS: 1.0000 | ORAL_CAPSULE | Freq: Every day | ORAL | Status: DC

## 2014-10-12 MED ORDER — ARIPIPRAZOLE 30 MG PO TABS: 30.0000 mg | ORAL_TABLET | Freq: Every day | ORAL | Status: DC

## 2014-10-12 MED ORDER — DIVALPROEX SODIUM ER 500 MG PO TB24: 500.0000 mg | ORAL_TABLET | Freq: Every day | ORAL | Status: DC

## 2014-10-12 MED ORDER — LORAZEPAM 0.5 MG PO TABS: 0.5000 mg | ORAL_TABLET | Freq: Two times a day (BID) | ORAL | Status: AC

## 2014-10-12 MED ORDER — TRAZODONE HCL 50 MG PO TABS: 50.0000 mg | ORAL_TABLET | Freq: Every evening | ORAL | Status: DC | PRN

## 2014-10-12 MED ORDER — ASPIRIN EC 81 MG PO TBEC: 81.0000 mg | DELAYED_RELEASE_TABLET | Freq: Every day | ORAL | Status: DC

## 2014-10-12 MED ORDER — VITAMIN D3 75 MCG (3000 UT) PO TABS: 3000.0000 [IU] | ORAL_TABLET | Freq: Every day | ORAL | Status: DC

## 2014-10-12 NOTE — Progress Notes (Signed)
D:Pt is in her room standing near her door talking about getting an Technical sales engineerofficer and an attorney for someone outside the hospital. Pt is mildly confused. A:Offered support, redirection and 1:1 monitoring. R:Safety maintained on the unit.

## 2014-10-12 NOTE — Progress Notes (Signed)
[facility] Post 1:1 Observation Documentation  For the first (8) hours following discontinuation of 1:1 precautions, a progress note entry by nursing staff should be documented at least every 2 hours, reflecting the patient's behavior, condition, mood, and conversation.  Use the progress notes for additional entries.  Time 1:1 discontinued:  1018  Patient's Behavior:  Pt walking in the hall waiting to discharge from the hospital   Patient's Condition:  No acute distress  Patient's Conversation:  Pt reports that she is looking for her husband  Beatrix ShipperWright, Deniel Mcquiston Martin 10/12/2014, 2:09 PM

## 2014-10-12 NOTE — Progress Notes (Signed)
  [facility] :  Will you be returning to the same living situation after discharge:  Yes,  home At discharge, do you have transportation home?: Yes,  husband Do you have the ability to pay for your medications: Yes,  insurance  Release of information consent forms completed and in the chart;  Patient's signature needed at discharge.  Patient to Follow up at: Follow-up Information    Follow up with [facility].   Why:  Alan RipperClaire will call you directly at discharge. If you do not get call by Monday, please call her to schedule appt.    Contact information:   ATTN: Arbutus PedClaire Huprich, LCSW [address] [place], [place] [address] Phone: [phone]. [phone] Fax: [phone]      Follow up with [facility] On 11/02/2014.   Why:  Arrive at 8:45 for your 9:15 appointment   Contact information:   ATTN: Dr. Donell BeersPlovsky [address] [place], [place] [address] Phone: [phone] Fax: [phone]      Follow up with [facility] On 10/16/2014.   Why:  Appt for neurological consult on this date at 11:15AM.    Contact information:   ATTN: Dr. Porfirio Mylararmen Dohmeier [address] [place], [place] [address] Phone: [phone] Fax: [phone]      Follow up with [facility]referral for memory testing.   Why:  Mr. Irving CopasFinn: Please call  to discuss referral-we are sending discharge paperwork to this facility and they are asking for 2 years worth of records. They will work with you directly to get [place] further memory testing.   Contact information:   [facility] [place], [place] [address] Phone: [phone] Fax: [phone]      Follow up with [facility].   Why:  Call Luvenia Hellerenee Wilson at this number Thursday or Friday to set up an assessment appointment   Contact information:   [address]  [place]  [phone]      [facility]   Why:  Will plan on seeing Lorene DyChristie on monday for a psychiatric nursing assessment to see if there is anything they can provide in home   Contact information:   [phone]      Patient denies SI/HI: Yes,  yes    Safety Planning and Suicide Prevention discussed: Yes,  yes  Has patient been referred to the Quitline?: N/A patient is not a smoker  Kiribatiorth, Thereasa DistanceRodney B 10/12/2014, 10:11 AM

## 2014-10-12 NOTE — [facility] Suicide Risk Assessment (Signed)
[facility]   Demographic Factors:  Caucasian  Total Time spent with patient: 30 minutes  Musculoskeletal: Strength & Muscle Tone: within normal limits Gait & Station: normal Patient leans: N/A  Psychiatric Specialty Exam: Physical Exam  Review of Systems  Constitutional: Negative.   HENT: Negative.   Eyes: Negative.   Respiratory: Negative.   Cardiovascular: Negative.   Gastrointestinal: Negative.   Genitourinary: Negative.   Musculoskeletal: Negative.   Skin: Negative.   Neurological: Negative.   Psychiatric/Behavioral: Negative for depression, suicidal ideas and hallucinations. The patient is not nervous/anxious.     Blood pressure 115/61, pulse 80, temperature 98.5 F (36.9 C), temperature source Oral, resp. rate 17, height 5\' 1"  (1.549 m), weight 55.792 kg (123 lb), SpO2 100 %.Body mass index is 23.25 kg/(m^2).  General Appearance: Fairly Groomed  Patent attorneyye Contact::  Fair  Speech:  Clear and Coherent409  Volume:  Normal  Mood:  Euthymic  Affect:  Congruent  Thought Process:  Goal Directed  Orientation:  Full (Time, Place, and Person)  Thought Content:  WDL  Suicidal Thoughts:  denies  Homicidal Thoughts:  denies  Memory:  Immediate;   Fair Recent;   Fair Remote;   Fair  Judgement:  Fair  Insight:  Fair  Psychomotor Activity:  Normal  Concentration:  Fair  Recall:  FiservFair  Fund of Knowledge:Fair  Language: Fair  Akathisia:  No  Handed:  Right  AIMS (if indicated):     Assets:  Communication Skills Desire for Improvement Social Support  Sleep:  Number of Hours: 6.75  Cognition: WNL  ADL's:  Intact   Have you used any form of tobacco in the last 30 days? (Cigarettes, Smokeless Tobacco, Cigars, and/or Pipes): No  Has this patient used any form of tobacco in the last 30 days? (Cigarettes, Smokeless Tobacco, Cigars, and/or Pipes) No  Mental Status Per Nursing Assessment::   On Admission:  Suicidal ideation indicated by  patient  Current Mental Status by Physician: patient denies SI/HI/AH/VH  Loss Factors: Decline in physical health  Historical Factors: Impulsivity  Risk Reduction Factors:   Living with another person, especially a relative and Positive social support  Continued Clinical Symptoms:  Previous Psychiatric Diagnoses and Treatments Medical Diagnoses and Treatments/Surgeries  Cognitive Features That Contribute To Risk:  Polarized thinking and Thought constriction (tunnel vision)  . Patient with nonspecific cognitive changes , pt today is alert , oriented x3.  Suicide Risk:  Minimal: No identifiable suicidal ideation.  Patients presenting with no risk factors but with morbid ruminations; may be classified as minimal risk based on the severity of the depressive symptoms  Principal Problem: Mild neurocognitive disorder Discharge Diagnoses:  Patient Active Problem List   Diagnosis Date Noted  . 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 Anxiety disorder [F41.9] 10/10/2014  . Anxiety [F41.9] 08/29/2014  . H/O deep venous thrombosis [Z86.718] 08/29/2014  . Healed or old pulmonary embolism [Z86.711] 08/29/2014  . Paranoia [F22]     Follow-up Information    Follow up with [facility].   Why:  Alan RipperClaire will call you directly at discharge. If you do not get call by Monday, please call her to schedule appt.    Contact information:   ATTN: Arbutus PedClaire Huprich, LCSW [address] [place], [place] [address] Phone: [phone] Fax: [phone]      Follow up with [facility] On 11/02/2014.   Why:  Arrive at 8:45  for your 9:15 appointment   Contact information:   ATTN: Dr. Donell Beers [address] [place], [place] [address] Phone: [phone] Fax: [phone]      Follow up with [facility] On 10/16/2014.   Why:  Appt for  neurological consult on this date at 11:15AM.    Contact information:   ATTN: Dr. Porfirio Mylar Dohmeier [address] [place], [place] [address] Phone: [phone] Fax: [phone]      Follow up with [facility]-referral for memory testing.   Why:  Mr. Rhodus: Please call  to discuss referral-we are sending discharge paperwork to this facility and they are asking for 2 years worth of records. They will work with you directly to get [place] [facility] further memory testing.   Contact information:   [facility] [place], [place] [address] Phone: [phone] Fax: [phone]      [facility] with [facility].   Why:  Call Luvenia Heller at this number Thursday or Friday to set up an assessment appointment   Contact information:   [address] [facility]  [phone]      Follow up with [facility].   Why:  Will plan on seeing [place] on monday for a psychiatric nursing assessment to see if there is anything they can provide in home   Contact information:   [phone]      Plan Of Care/Follow-up recommendations:  Activity:  No restrictions Diet:  regular Tests:  as needed, follow up with after care as scheduled Other:  none  Is patient on multiple antipsychotic therapies at discharge:  No   Has Patient had three or more failed trials of antipsychotic monotherapy by history:  No  Recommended Plan for Multiple Antipsychotic Therapies: NA    Adir Schicker MD 10/12/2014, 9:39 AM

## 2014-10-12 NOTE — Progress Notes (Addendum)
1:1 Nursing Note: Patient 's husband visited at the beginning of the shift. She reported that they have been married for 35 years and they have one daughter. Her mood and affect flat and depressed. She complaint of having headache. Denied SI/HI and denied Hallucinations. Writer offered patient ibuprofen for the headache. She requested for Ativan with her bedtime medication. Patient also received Ativan. She seemed less confused during my assessment. Writer encouraged and supported patient. 1:1 minute observation continues as ordered to maintain safety.

## 2014-10-12 NOTE — Progress Notes (Signed)
Pt d/c from the hospital with her husband. All items returned. D/C instructions given, prescriptions given and samples given. D/C instructions given to pt and her husband.

## 2014-10-12 NOTE — Discharge Summary (Signed)
Physician Discharge Summary Note  Patient:  Diane LuxChristie M Richardson is an 64 y.o., female MRN:  [id number] DOB:  12/03/1950 Patient phone:  [phone] (home)  Patient address:   [address] [place] [address],  Total Time spent with patient: 30 minutes  Date of Admission:  09/29/2014 Date of Discharge: 10/12/2014  Reason for Admission:  Suicidal ideation  Principal Problem: Mild neurocognitive disorder Discharge Diagnoses: Patient Active Problem List   Diagnosis Date Noted  . 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 Anxiety disorder [F41.9] 10/10/2014  . Anxiety [F41.9] 08/29/2014  . H/O deep venous thrombosis [Z86.718] 08/29/2014  . Healed or old pulmonary embolism [Z86.711] 08/29/2014  . Paranoia [F22]    Musculoskeletal: Strength & Muscle Tone: within normal limits Gait & Station: normal Patient leans: N/A  Psychiatric Specialty Exam: Physical Exam  ROS  Blood pressure 115/61, pulse 80, temperature 98.5 F (36.9 C), temperature source Oral, resp. rate 17, height 5\' 1"  (1.549 m), weight 55.792 kg (123 lb), SpO2 100 %.Body mass index is 23.25 kg/(m^2).   Past Medical History:  Past Medical History  Diagnosis Date  . Fibromyalgia   . PTSD (post-traumatic stress disorder)   . Anxiety   . Depression   . DVT (deep venous thrombosis)   . PE (pulmonary embolism)   . ADD (attention deficit disorder)   . Muscle spasm     bad  . Fatigue     chronic    Past Surgical History  Procedure Laterality Date  . Childbirth  1989    x1,NVD  . Pulmonary embolism surgery  1983    x9 days   Family History:  Family History  Problem Relation Age of Onset  . CAD Father   . Hyperlipidemia Father   . Hypertension Father   . Hyperlipidemia Brother   . Diabetes Maternal Grandmother   . Cancer Maternal Grandmother     stomach  . Dementia Maternal Grandfather   . Colon polyps Maternal Grandfather   . CAD Paternal  Grandmother   . Diabetes Paternal Grandmother   . Dementia Paternal Grandfather    Social History:  History  Alcohol Use  . Yes    Comment: 3 glasses of wine weekly     History  Drug Use No    History   Social History  . Marital Status: Married    Spouse Name: N/A    Number of Children: 1  . Years of Education: PHD   Occupational History  . disabled     FORMER TEACHER   Social History Main Topics  . Smoking status: Never Smoker   . Smokeless tobacco: Never Used  . Alcohol Use: Yes     Comment: 3 glasses of wine weekly  . Drug Use: No  . Sexual Activity: Yes   Other Topics Concern  . None   Social History Narrative   Past Psychiatric History: Hospitalizations:  depression  [facility]   Substance Abuse Care:  Denies  Self-Mutilation:  Denies  Suicidal Attempts:  Denies  Violent Behaviors:  Denies   Risk to Self: Is patient at risk for suicide?: Yes What has been your use of drugs/alcohol within the last 12 months?: Pt reports 1 - 2 glasses wine 1 - 3 times weekly Risk to Others:   Prior Inpatient Therapy:   Prior Outpatient Therapy:    Level of Care:  OP  Hospital Course:  Diane LuxChristie M Richardson is a 64  y.o. female patient admitted with paranoia and suicide threat. She presented to [facility] tangential and hypomanic upset with husband and daughter per ED report. Patient came in stating she was feeling bad and would hurt self if she was not able to go to her house. She was accepted at [facility] for inpatient treatment. Patient was disorganized and challenging to follow her thought patterns. She mentioned a weekly "pill box" and that my daughter had messed up filling them."  The patient is difficult to get to answer questions directly. She states that she has suffered multiple injuries on her horse, including hitting her head. The patient has not been taking her medications.   She appears somnolent. She is not talkative and hard to get answers to questions  directly. She is not exhibiting disruptive behaviors on the unit. She is adherent to medication regimen.  Moods gradually improved.  Patient became more alert, oriented, less anxious and looked forward to discharge.  At time of discharge, she rated both depression and anxiety levels to be manageable and minimal.  Denies physiological concerns/paranoia/SI/HI/AVH at time of discharge.  She has satisfactory support network and home environment and will adhere to medication compliance and outpatient treatment.  Several discussions with patient again today that BZD causing cognitive issues and could worsen her sx. Discussed alternative treatment. However patient is not willing to make any changes and would like to stay on BZD.  States that is the only medication that will help her. Pt understands the risks associated with it including memory issues, confusionfall risk and so on.  This was also discussed with patient's husband "Kathlene November " yesterday.  Writer discussed Ativan Rx to patient and husband and advised to follow dosage and frequency as prescribed.  Patient to follow up with outpt psychiatrist who could taper her off of it.  Per Dr. Rosezella Rumpf notes in EHR from 10/09/14 : With her express consent I have spoken with Dr. Maryclare Bean , her outpatient Neurologist , Dr. Evelene Croon, her outpatient Psychiatrist, and today with Dr. Katrinka Blazing, her long time PCP. There is agreement that there is likely and underlying neurologic, cognitive disorder, which may be contributing to her delusions and functional decline.  As noted, patient does have A history of several head traumas over recent months to yearsrelated to falling off horses .   Consults:  psychiatry  Significant Diagnostic Studies:  labs: per ED  Discharge Vitals:   Blood pressure 115/61, pulse 80, temperature 98.5 F (36.9 C), temperature source Oral, resp. rate 17, height  (1.549 m), weight 55.792 kg (123 lb), SpO2 100 %. Body mass index is 23.25 kg/(m^2). Lab  Results:   No results found for this or any previous visit (from the past 72 hour(s)).  Physical Findings: AIMS: Facial and Oral Movements Muscles of Facial Expression: None, normal Lips and Perioral Area: None, normal Jaw: None, normal Tongue: None, normal,Extremity Movements Upper (arms, wrists, hands, fingers): None, normal Lower (legs, knees, ankles, toes): None, normal, Trunk Movements Neck, shoulders, hips: None, normal, Overall Severity Severity of abnormal movements (highest score from questions above): None, normal Incapacitation due to abnormal movements: None, normal Patient's awareness of abnormal movements (rate only patient's report): No Awareness, Dental Status Current problems with teeth and/or dentures?: No Does patient usually wear dentures?: No  CIWA:  CIWA-Ar Total: 2 COWS:  COWS Total Score: 1   See Psychiatric Specialty Exam and Suicide Risk Assessment completed by Attending Physician prior to discharge.  Discharge destination:  Home  Is patient on multiple  antipsychotic therapies at discharge:  No   Has Patient had three or more failed trials of antipsychotic monotherapy by history:  No    Recommended Plan for Multiple Antipsychotic Therapies: NA     Medication List    STOP taking these medications        benztropine 1 MG tablet  Commonly known as:  COGENTIN     buPROPion 300 MG 24 hr tablet  Commonly known as:  WELLBUTRIN XL     clonazePAM 2 MG tablet  Commonly known as:  KLONOPIN     cyclobenzaprine 10 MG tablet  Commonly known as:  FLEXERIL     ibuprofen 200 MG tablet  Commonly known as:  ADVIL,MOTRIN     SKELAXIN 800 MG tablet  Generic drug:  metaxalone     sulfamethoxazole-trimethoprim 800-160 MG per tablet  Commonly known as:  BACTRIM DS,SEPTRA DS     traMADol 50 MG tablet  Commonly known as:  ULTRAM      TAKE these medications      Indication   ARIPiprazole 30 MG tablet  Commonly known as:  ABILIFY  Take 1 tablet (30 mg  total) by mouth daily.   Indication:  mood psychosis     aspirin EC 81 MG tablet  Take 1 tablet (81 mg total) by mouth daily.   Indication:  Mild to Moderate Pain, health management     CoQ-10 50 MG Caps  Take 1 capsule by mouth daily. One capsule with a meal for supplementation   Indication:  supplement     divalproex 500 MG 24 hr tablet  Commonly known as:  DEPAKOTE ER  Take 1 tablet (500 mg total) by mouth at bedtime.   Indication:  mood stabilization     LORazepam 0.5 MG tablet  Commonly known as:  ATIVAN  Take 1 tablet (0.5 mg total) by mouth 2 (two) times daily.   Indication:  Feeling Anxious     traZODone 50 MG tablet  Commonly known as:  DESYREL  Take 1 tablet (50 mg total) by mouth at bedtime as needed for sleep.   Indication:  Trouble Sleeping     Vitamin D3 3000 UNITS Tabs  Take 3,000 Units by mouth daily. For nutrition supplementation   Indication:  vit D supplements           Follow-up Information    Follow up with [facility].   Why:  Alan Ripper will call you directly at discharge. If you do not get call by Monday, please call her to schedule appt.    Contact information:   ATTN: Arbutus Ped, LCSW [address] [place] [place], [place] [address] Phone: [phone]. [phone] Fax: [phone]      Follow up with [facility] On 11/02/2014.   Why:  Arrive at 8:45 for your 9:15 appointment   Contact information:   ATTN: Dr. Donell Beers [address] [place], [place] [address] Phone: [phone] Fax: [phone]      Follow up with [facility] On 10/16/2014.   Why:  Appt for neurological consult on this date at 11:15AM.    Contact information:   ATTN: Dr. Porfirio Mylar Dohmeier [address] [place], [place] [address] Phone: [phone] Fax: [phone]      Follow up with [facility]referral for memory testing.   Why:  Mr. Fait: Please call  to discuss  referral-we are sending discharge paperwork to this facility and they are asking for 2 years worth  of records. They will work with you directly to get [place] further memory testing.   Contact information:   [facility] [place], [place] [address] Phone: [phone] Fax: [phone]      [facility].   Why:  Call Luvenia Heller at this number Thursday or Friday to set up an assessment appointment   Contact information:   [address] [facility]  [phone]      Follow up with [facility].   Why:  Will plan on seeing Ia on monday for a psychiatric nursing assessment to see if there is anything they can provide in home   Contact information:   [phone]      Follow-up recommendations:  Activity:  as tol, diet as tol  Comments:  1.  Take all your medications as prescribed.              2.  Report any adverse side effects to outpatient provider.                       3.  Patient instructed to not use alcohol or illegal drugs while on prescription medicines.            4.  In the event of worsening symptoms, instructed patient to call 911, the crisis hotline or go to nearest emergency room for evaluation of symptoms.  Total Discharge Time:  30 min  Signed: Adonis Brook MAY, AGNP-BC 10/12/2014, 2:55 PM

## 2014-10-12 NOTE — Progress Notes (Signed)
1:1 Nursing Note Patient resting quietly with eyes closed. Respirations even and unlabored. No distress noted.1:1 observations continues as ordered to maintain safety.

## 2014-10-12 NOTE — Progress Notes (Signed)
Nursing 1:1 note Patient requested for Mylanta around 0400 am . She still mildly confused but pleasant. She said she is looking forward to her discharge today and would be happy to see her daughter. Safety maintained.

## 2014-10-16 ENCOUNTER — Encounter: Payer: Self-pay | Admitting: Neurology

## 2014-10-16 ENCOUNTER — Telehealth: Payer: Self-pay | Admitting: Neurology

## 2014-10-16 ENCOUNTER — Ambulatory Visit (INDEPENDENT_AMBULATORY_CARE_PROVIDER_SITE_OTHER): Payer: Medicare PPO | Admitting: Neurology

## 2014-10-16 VITALS — BP 118/64 | HR 80 | Ht 61.75 in | Wt 130.0 lb

## 2014-10-16 DIAGNOSIS — F429 Obsessive-compulsive disorder, unspecified: Secondary | ICD-10-CM

## 2014-10-16 DIAGNOSIS — F22 Delusional disorders: Secondary | ICD-10-CM

## 2014-10-16 DIAGNOSIS — F42 Obsessive-compulsive disorder: Secondary | ICD-10-CM

## 2014-10-16 NOTE — Telephone Encounter (Signed)
EEG for this patient to evaluate medication related cognitive slowing , MOCA and MMSE were normal,

## 2014-10-16 NOTE — Progress Notes (Signed)
[facility]  Provider:  Dr Brett Fairy Referring Provider: Primary Care Physician:  Reginia Naas, MD  Chief Complaint  Patient presents with  . RV memory    Rm 10, husband    HPI:  Diane Richardson is a caucasian  64 y.o. female ,  disabled due to anxiety and depression for  Many years before her numeric retirement age. She is here as a referral from Dr. Tamala Julian , the patient had been referred on 08/30/2012  for a detailed neuropsychological testing to Dr. Valentina Shaggy at the rehabilitation center for  [facility].  I have signed off this patient's care at that point.  The report was received and of March 2014. Dr. Valentina Shaggy  mentioned that the patient missed her initial appointment was in and was 35 minutes late for a second recheck appointment. She is 25  minutes late at the time of arrival to my appointment.,too. Dr. Toy Care  is the patient's psychiatrist discontinued fluoxetine after receiving Dr. Francesca Oman neuropsychological test report. Dr. Ledon Snare diagnostic impressions were that the patient has problems with attentional control and executive functioning this may represent an exacerbation of an obsessive-compulsive or depressive condition cultivated alternatively,  he would like her from a neurological prospective to be evaluated for frontal lobe dysfunction. This should be done today.  It is late in today s delayed appointment that I learn abo out a hospital evaluation last May ( last Month ).  MOCA here is 28 points out of 30.  Words with AF :  22 words. MMSE 29 points, AFT 22  Points  .  The patient was seen for a fall with a resulting possible common percussion in the  emergency department - CT non contrast was  Non- diagnostic for concussion but showed brain atrophy. . The patient cannot remember the circumstance of the fall, she speaks extremely circumferential, tangential and  Has had  short term memory loss for hours after the fall.  I suggest a MRI brian with contrast. I  will order an MRI brain for frontotemporal evaluation . I find Diane Richardson's mental status nearly unchanged after 7 years. It is possible that the patient suffered a concussion given her confusion after the fall. The CT did not show any evidence of bleed or petechiae. Her blood test showed normal white and red cell counts, normal liver and kidney function as well as TSH been normal vitamin B12 being normal.    10-16-14 Interval history , the patient was seen by Cecille Rubin .  Epic note from 9-14 , and did not show up for her previous appointment in 2015, was seen by Hoyle Sauer again in December.   Diane. Richardson is an established patient in our practice my last saw in June 2014 and has suffered from memory loss but this was felt to be also due to anxiety and depression and not truly dementia in origin. She seemed to today in the company of her husband her initial workup includes today to memory tests due to the chief complaint of the past in a Mini-Mental examination, the so-called. Mini-Mental test she scored 30 out of 30 points and in the [facility] cognitive assessment test which is considered considered much more difficult, she scored 29 out of 30.  I would not consider this patient to be memory impaired based on these 2 tests.  I would like to especially state,  that the patient is able to do complex tasks such as the trail making test, drawing a three-dimensional cube and  she recalled 5 of 5 words she is fully oriented to place and time. She was not able to do these tests during her hospitliization in December , for paranoia. She has a systematic delusion. She is constanlly talking tangentially about a conspiracy , about her husband tricking her into the treatment and admission. Dr. Toy Care is treating her anxiety, insomnia and paranoia.  I am not sure why she is here today ?       Her medications seemed to have had highly impact on the test results.   She suffers from paranoia.             Review  of Systems: Out of a complete 14 system review, the patient complains of only the following symptoms, and all other reviewed systems are negative.  Back pain, fold, confusion after the fall was suspected head injury. I'm knees are for the period of the fall and what led to the fall.    History   Social History  . Marital Status: Married    Spouse Name: N/A    Number of Children: 1  . Years of Education: PHD   Occupational History  . disabled     FORMER TEACHER   Social History Main Topics  . Smoking status: Never Smoker   . Smokeless tobacco: Never Used  . Alcohol Use: Yes     Comment: 3 glasses of wine weekly  . Drug Use: No  . Sexual Activity: Yes   Other Topics Concern  . Not on file   Social History Narrative   Caffeine    Family History  Problem Relation Age of Onset  . CAD Father   . Hyperlipidemia Father   . Hypertension Father   . Hyperlipidemia Brother   . Diabetes Maternal Grandmother   . Cancer Maternal Grandmother     stomach  . Dementia Maternal Grandfather   . Colon polyps Maternal Grandfather   . CAD Paternal Grandmother   . Diabetes Paternal Grandmother   . Dementia Paternal Grandfather     Past Medical History  Diagnosis Date  . Fibromyalgia   . PTSD (post-traumatic stress disorder)   . Anxiety   . Depression   . DVT (deep venous thrombosis)   . PE (pulmonary embolism)   . ADD (attention deficit disorder)   . Muscle spasm     bad  . Fatigue     chronic    Past Surgical History  Procedure Laterality Date  . Childbirth  1989    x1,NVD  . Pulmonary embolism surgery  1983    x9 days    Current Outpatient Prescriptions  Medication Sig Dispense Refill  . ARIPiprazole (ABILIFY) 30 MG tablet Take 1 tablet (30 mg total) by mouth daily. 30 tablet 0  . aspirin EC 81 MG tablet Take 1 tablet (81 mg total) by mouth daily.    . Cholecalciferol (VITAMIN D3) 3000 UNITS TABS Take 3,000 Units by mouth daily. For nutrition supplementation     . Coenzyme Q10 (COQ-10) 50 MG CAPS Take 1 capsule by mouth daily. One capsule with a meal for supplementation  0  . divalproex (DEPAKOTE ER) 500 MG 24 hr tablet Take 1 tablet (500 mg total) by mouth at bedtime. 30 tablet 0  . LORazepam (ATIVAN) 0.5 MG tablet Take 1 tablet (0.5 mg total) by mouth 2 (two) times daily. 30 tablet 0  . metaxalone (SKELAXIN) 800 MG tablet Take 800 mg by mouth as needed.   0  . traZODone (  DESYREL) 50 MG tablet Take 1 tablet (50 mg total) by mouth at bedtime as needed for sleep. (Patient not taking: Reported on 10/16/2014) 30 tablet 0   No current facility-administered medications for this visit.    Allergies as of 10/16/2014 - Review Complete 10/16/2014  Allergen Reaction Noted  . Codeine Hives and Diarrhea 02/03/2013  . Penicillins Hives and Diarrhea 01/23/2013  . Percocet [oxycodone-acetaminophen] Other (See Comments) 02/03/2013  . Percodan [oxycodone-aspirin] Other (See Comments) 02/03/2013  . Donnatal [belladonna alk-phenobarb er] Rash 02/03/2013    Vitals: BP 118/64 mmHg  Pulse 80  Ht 5' 1.75" (1.568 m)  Wt 130 lb (58.968 kg)  BMI 23.98 kg/m2  LMP  (LMP Unknown) Last Weight:  Wt Readings from Last 1 Encounters:  10/16/14 130 lb (58.968 kg)   Last Height:   Ht Readings from Last 1 Encounters:  10/16/14 5' 1.75" (1.568 m)   Vision Screening:    Physical exam:  General: The patient is awake, alert and appears not in acute distress. The patient is well groomed. Head: Normocephalic, atraumatic. Neck is supple. Mallampati2, neck circumference:14.5 inches  Cardiovascular:  Regular rate and rhythm , without  murmurs or carotid bruit, and without distended neck veins. Respiratory: Lungs are clear to auscultation. Skin:  Without evidence of edema, or rash Trunk: BMI is  normal , as is posture.  Neurologic exam : The patient is awake and alert, oriented to place and time.  Memory subjective  described as impaired , significant problems with  attention.   Abnormal function in March / April  2014 testing with Dr Valentina Shaggy.  There is a impaired  attention span & concentration ability.  S Speech is fluent- logorrheic,but with  dysarthria, dysphonia. Mood and affect are  aloof . Cranial nerves: Pupils are equal and briskly reactive to light. Funduscopic exam without  evidence of pallor or edema. Extraocular movements  in vertical and horizontal planes intact and without nystagmus. Visual fields by finger perimetry are intact. Hearing to finger rub intact.  Facial sensation intact to fine touch. Facial motor strength is symmetric and tongue and uvula move midline.  Motor exam:   Normal tone and normal muscle bulk and symmetric normal strength in all extremities.  Sensory:  Fine touch, pinprick and vibration were tested in all extremities. Proprioception is tested in the upper extremities only. This was  normal.  Coordination: Rapid alternating movements in the fingers/hands is tested and normal. Finger-to-nose maneuver tested and normal without evidence of ataxia, dysmetria or tremor.  Gait and station: Patient walks without assistive device and is able to rise form a seated position.  Strength within normal limits.  Stance is stable and normal.   Steps are unfragmented. Romberg testing is normal. Tandem is slowed , but  unimpaired. She turned with 4  steps.   Deep tendon reflexes: in the  upper and lower extremities are symmetric and intact. Babinski maneuver response is  downgoing. Patient reports pain especially at the patellar region when testing deep tendon reflexes, she attributes this to her fibromyalgia history.   Assessment:   After physical and neurologic examination, review of laboratory studies, imaging, neurophysiology testing and pre-existing records, assessment :  The patient is an active horse rider, fell again in October 2015 and than later in autumn once more quit. She was supposingly concussed . She had a previous  concussion or contusion in 2014.  She is not a reliable witness or reported of the process, she had a sore head, headaches, not  nausea, unclear if LOC or loss a of awareness. The falls were witnessed.   Review of both memory tests performed here MMSE 30-30 and MOCA 29-30.   Verbal acuity was good. There is tangential thinking, paranoid content.  She answers in  a monotone voice and is slightly slurred. She appears angry about her January 2016  mental health evaluation.  She underwent 10-04-14 a MRI brain in the hospital: FINDINGS: There is no evidence of acute infarct, intracranial hemorrhage, mass, midline shift, or extra-axial fluid collection. There is moderate cerebral atrophy, unchanged. Patchy T2 hyperintensities in the subcortical and deep cerebral white matter bilaterally do not appear significantly changed from the prior MRI and are nonspecific but compatible with moderate chronic small vessel ischemic disease.  Orbits are unremarkable. Paranasal sinuses and mastoid air cells are clear. Major intracranial vascular flow voids are preserved.  IMPRESSION: 1. No acute intracranial abnormality. 2. Moderate cerebral atrophy and chronic small vessel ischemic disease, unchanged from the prior MRI and advanced for age.    PLAN : paranoia .  The patient appears slowed in her movements by the medications, has little facial expression and a slurred ,momotone voice.  She is agitated, angry at her spouse. There is no primary neurological concern. Her judgement is impaired, she was easily  Distracted. She has questions about the Adderall feeding into her anxiety and paranoia. That, I cannot answer. I will not order a follow up, but recommend a neuropsychological battery. The patient is interested in psychiatric follow up, too.

## 2014-10-17 NOTE — Progress Notes (Signed)
Patient Discharge Instructions:  After Visit Summary (AVS):   Faxed to:  10/17/14 Discharge Summary Note:   Faxed to:  10/17/14 Psychiatric Admission Assessment Note:   Faxed to:  10/17/14 Suicide Risk Assessment - Discharge Assessment:   Faxed to:  10/17/14 Faxed/Sent to the Next Level Care provider:  10/17/14 [facility] @ [phone] [facility] to [facility] Dr. Richardean Chimeraohmeir @ [phone] Faxed to [facility] @ [phone] Faxed to [facility] - Dr. Donell BeersPlovsky @ [phone]  Jerelene ReddenSheena E [place], 10/17/2014, 2:23 PM

## 2014-10-25 ENCOUNTER — Ambulatory Visit (INDEPENDENT_AMBULATORY_CARE_PROVIDER_SITE_OTHER): Payer: Medicare PPO | Admitting: Neurology

## 2014-10-25 DIAGNOSIS — F429 Obsessive-compulsive disorder, unspecified: Secondary | ICD-10-CM

## 2014-10-25 DIAGNOSIS — F22 Delusional disorders: Secondary | ICD-10-CM

## 2014-11-08 NOTE — Procedures (Signed)
[facility]  EEG (ELECTROENCEPHALOGRAM) REPORT   STUDY DATE:  10-25-2014 PATIENT NAME: Diane Richardson, Bradlee  MRN: [id number]  Referring physician Dr. Jerrilyn CairoKaur/ Plovsky and  Dr. Merri Brunetteandace Smith  (primary care physician) ORDERING CLINICIAN: Melvyn Novasarmen Laticha Ferrucci, MD   TECHNOLOGIST: Yetta BarreJones TECHNIQUE: Electroencephalogram was recorded utilizing standard 10-20 system of lead placement and reformatted into average and bipolar montages.   RECORDING TIME: 30 minutes,   ACTIVATION:  strobe lights and hyperventilation    CLINICAL INFORMATION:  [name] has been followed in this practice over 2 years, she presents with a progressive psychomotor slowing associated with memory loss, bradyphrenia, forgetfulness, repetitiveness. 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 Her husband describes a change in personality. Some problems with impulse control.  FINDINGS:  There is no clearly identified posterior dominant rhythm. There is abnormal slowing over both frontal regions 5-6 Hz, during photic stimulation there was excessive eye blink artifact but even aside from photic stimulation frontal polar and frontal channels indicated rhythmic eye movements. At times it appears as if  Beta fast activity interferes with a central and posterior regions of the EEG recording. No clear identification of a delta brush pattern. The technologist made annotations indicating that the patient was talking was excessively moving perhaps twitching perhaps just been restless throughout the EEG which would have interfered with the quality of the study. Her EKG is of normal sinus rhythm.      IMPRESSION:  This EEG study is not normal and I will ask Dr Karel JarvisAquino to evaluate this patient for abnormal EEG.       Melvyn Novasarmen Modene Andy , MD

## 2014-12-04 ENCOUNTER — Ambulatory Visit: Payer: Medicare PPO | Attending: Nurse Practitioner | Admitting: Physical Therapy

## 2014-12-04 DIAGNOSIS — R293 Abnormal posture: Secondary | ICD-10-CM | POA: Insufficient documentation

## 2014-12-04 DIAGNOSIS — R269 Unspecified abnormalities of gait and mobility: Secondary | ICD-10-CM | POA: Diagnosis present

## 2014-12-04 NOTE — Therapy (Signed)
[facility] [address] [place], [place], [address] Phone: [phone]   Fax:  [phone]  Physical Therapy Evaluation  Patient Details  Name: Diane Richardson MRN: [id number] Date of Birth: 1951-09-01 Referring Provider:  Lynder Parents, NP  Encounter Date: 12/04/2014      PT End of Session - 12/04/14 1312    Visit Number 1   Number of Visits 9   Date for PT Re-Evaluation 02/03/15   PT Start Time 1149   PT Stop Time 1237   PT Time Calculation (min) 48 min   Activity Tolerance Patient tolerated treatment well   Behavior During Therapy [facility] for tasks assessed/performed      Past Medical History  Diagnosis Date  . Fibromyalgia   . PTSD (post-traumatic stress disorder)   . Anxiety   . Depression   . DVT (deep venous thrombosis)   . PE (pulmonary embolism)   . ADD (attention deficit disorder)   . Muscle spasm     bad  . Fatigue     chronic    Past Surgical History  Procedure Laterality Date  . Childbirth  1989    x1,NVD  . Pulmonary embolism surgery  1983    x9 days    There were no vitals filed for this visit.  Visit Diagnosis:  Abnormality of gait  Postural instability      Subjective Assessment - 12/04/14 1157    Symptoms Pt is a 64 year old female who presents to OP PT with history of falls outdoors when she is near her horses.  Pt has fallen three times in the past 4 years.  She is concerned because she feels weaker and having spastic muscles since being on Abilify.  She feels that she is having stumbles and balance issues, with most recent fall in her home earlier this month.She does not use assistive device.  She is able to get up from floor on her own.    Pertinent History Delusional, psychiatric history   Patient Stated Goals Pt wants to be more fit and healthy, be able to walk and hike outdoors and safely participate with her horses.   Currently in Pain? Yes  MD is aware of pain; pt to  potentially have bunionectomy   Pain Score 10-Worst pain ever   Pain Location Foot   Pain Orientation Right;Left   Pain Descriptors / Indicators Burning;Shooting   Pain Type Chronic pain   Pain Onset More than a month ago   Pain Frequency Intermittent   Aggravating Factors  walking aggravates   Pain Relieving Factors petroleum jelly alleviates pain            [facility] PT Assessment - 12/04/14 1205    Assessment   Medical Diagnosis frequent falls   Onset Date --  December 2015/January 2016 after beginning new medication   Precautions   Precautions Fall   Balance Screen   Has the patient fallen in the past 6 months Yes   How many times? 3  at least 3 falls   Has the patient had a decrease in activity level because of a fear of falling?  No   Is the patient reluctant to leave their home because of a fear of falling?  No   Home Environment   Living Enviornment Private residence   Living Arrangements Spouse/significant other;Children  Has nurse with her 3 days/wk   Available Help at Discharge Family;Friend(s);Personal care attendant   Type of Home House  Home Access Stairs to enter   Entrance Stairs-Number of Steps 2   Entrance Stairs-Rails None   Home Layout Able to live on main level with bedroom/bathroom;Two level   Prior Function   Level of Independence Independent with homemaking with ambulation;Independent with gait;Independent with transfers  takes longer   Leisure Enjoys horse back riding; walks nearly a mile everyday; has enjoyed swimming, but not as much recently due to increased saliva on medication   ROM / Strength   AROM / PROM / Strength Strength   Strength   Overall Strength Comments grossly tested 5/5, except L ankle dorsiflexion 4/5   Strength Assessment Site Hip;Knee;Ankle   Ambulation/Gait   Ambulation/Gait Yes   Ambulation/Gait Assistance 5: Supervision   Ambulation/Gait Assistance Details Pt has increased forward flexed posture with gait and no arm  swing   Ambulation Distance (Feet) 200 Feet   Assistive device None   Gait Pattern Decreased step length - right;Decreased step length - left;Decreased dorsiflexion - right;Decreased dorsiflexion - left  L foot catches; R foot slaps; decr. arm swing   Ambulation Surface Level;Indoor   Gait velocity 12.14=2.7 ft/sec   Stairs Yes   Stairs Assistance 5: Supervision   Stairs Assistance Details (indicate cue type and reason) --  decreased foot clearance descending steps   Stair Management Technique Two rails;Alternating pattern   Number of Stairs 4   Height of Stairs 6   Standardized Balance Assessment   Standardized Balance Assessment Dynamic Gait Index;Timed Up and Go Test;Berg Balance Test   Berg Balance Test   Sit to Stand Able to stand without using hands and stabilize independently   Standing Unsupported Able to stand safely 2 minutes   Sitting with Back Unsupported but Feet Supported on Floor or Stool Able to sit safely and securely 2 minutes   Stand to Sit Controls descent by using hands   Transfers Able to transfer safely, definite need of hands   Standing Unsupported with Eyes Closed Able to stand 10 seconds with supervision   Standing Ubsupported with Feet Together Able to place feet together independently and stand 1 minute safely   From Standing, Reach Forward with Outstretched Arm Can reach confidently >25 cm (10")   From Standing Position, Pick up Object from Floor Able to pick up shoe, needs supervision   From Standing Position, Turn to Look Behind Over each Shoulder Looks behind from both sides and weight shifts well   Turn 360 Degrees Able to turn 360 degrees safely but slowly   Standing Unsupported, Alternately Place Feet on Step/Stool Able to stand independently and safely and complete 8 steps in 20 seconds   Standing Unsupported, One Foot in Front Able to plae foot ahead of the other independently and hold 30 seconds   Standing on One Leg Tries to lift leg/unable to hold  3 seconds but remains standing independently   Total Score 46   Dynamic Gait Index   Level Surface Moderate Impairment   Change in Gait Speed Mild Impairment   Gait with Horizontal Head Turns Mild Impairment   Gait with Vertical Head Turns Mild Impairment   Gait and Pivot Turn Mild Impairment  3.89 sec, decr. foot clearance   Step Over Obstacle Mild Impairment   Step Around Obstacles Mild Impairment   Steps Mild Impairment   Total Score 15   Timed Up and Go Test   TUG Normal TUG   Normal TUG (seconds) 13.17  PT Long Term Goals - December 15, 2014 1714    PT LONG TERM GOAL #1   Title Pt will perform HEP for balance/transfers/functional strength/gait with family/caregiver supervision (Target date 01/04/15)   Time 4   Period Weeks   Status New   PT LONG TERM GOAL #2   Title Pt will improve Dynamic Gait Index score to at least 19/24 for decreased fall risk.   Time 4   Period Weeks   Status New   PT LONG TERM GOAL #3   Title Pt will improve Berg Balance score to at least 51/56 for decreased fall risk.   Time 4   Period Weeks   Status New   PT LONG TERM GOAL #4   Title Pt will verbalize understanding of fall prevention techniques within home environment.   Time 4   Period Weeks   Status New   PT LONG TERM GOAL #5   Title Pt will ambulate at least 1000 ft, indoor/outdoor surfaces, independently with no loss of balance, for improved gait in outdoor areas.   Time 4   Period Weeks   Status New   Additional Long Term Goals   Additional Long Term Goals Yes   PT LONG TERM GOAL #6   Title Pt will perform at least 6 of 10 reps of sit<>stand transfers with minimal to no UE support for improved safety and efficiency of transfers.   Time 4   Period Weeks   Status New               Plan - 2014/12/15 1313    Clinical Impression Statement Pt is a 64 year old female who presents to OP PT with diagnosis of frequent falls.  She reports  her balance and walking have declined since beginning Abilify medication in Dec. 2015/January 2016.  Pt reports having at least 3 falls in the past 6 months, several of which she lost her balance due to external perturbation (dog, horse) and she was unable to regain her balance.  She is at increased fall risk per Berg score of 46/56 and Dynamic Gait Index score of 15/24.  She presents decreased foot clearance, decreased arm swing and decreased overall balance with gait.  Pt would benefit from skilled PT to address balance and gait.   Pt will benefit from skilled therapeutic intervention in order to improve on the following deficits Abnormal gait;Decreased activity tolerance;Decreased balance;Decreased mobility;Decreased strength;Difficulty walking   Rehab Potential Good   PT Frequency 2x / week   PT Duration 4 weeks  plus eval   PT Treatment/Interventions ADLs/Self Care Home Management;Gait training;Stair training;Functional mobility training;Neuromuscular re-education;Balance training;Therapeutic exercise;Therapeutic activities;Patient/family education   PT Next Visit Plan Initiate balance exercises, gait and use of machines for functional strengthening; transfer training   PT Home Exercise Plan corner and counter balance exercises; hip/ankle strategy, transfer training   Consulted and Agree with Plan of Care Patient;Family member/caregiver   Family Member Consulted Catherine-caregiver          G-Codes - Dec 15, 2014 1719    Functional Assessment Tool Used Berg Balance score 46/56, Dynamic Gait Index score 15/24, at least 3 falls in past 6 months   Functional Limitation Mobility: Walking and moving around   Mobility: Walking and Moving Around Current Status [phone]) At least 40 percent but less than 60 percent impaired, limited or restricted   Mobility: Walking and Moving Around Goal Status (U0454) At least 1 percent but less than 20 percent impaired, limited or restricted  Problem  List Patient Active Problem List   Diagnosis Date Noted  . OCD (obsessive compulsive disorder) 10/16/2014  . Delusional disorder 10/16/2014  . Mild neurocognitive disorder 10/10/2014  . Mild benzodiazepine use disorder 10/10/2014  . Anxiety disorder 10/10/2014  . Anxiety 08/29/2014  . H/O deep venous thrombosis 08/29/2014  . Healed or old pulmonary embolism 08/29/2014  . Paranoia     Imanii Gosdin W. 12/04/2014, 5:20 PM  Lonia Bloodmy Kaveon Blatz, PT 12/04/2014 5:21 PM Phone: [phone] Fax: [phone]   [facility] [address] [place], [place], [address] Phone: [phone]   Fax:  [phone]

## 2014-12-07 ENCOUNTER — Telehealth: Payer: Self-pay | Admitting: *Deleted

## 2014-12-07 NOTE — Telephone Encounter (Signed)
Patient is calling wanting to know if it is ok for her to drive. Dr. Desiree Haneoh never said she could not drive but patient family thinks someone needs to drive the patient round. Please call the patient and advise.

## 2014-12-08 NOTE — Telephone Encounter (Signed)
LMVM for pt that returned her call and to address with Dr. Vickey Hugerohmeier on Monday.

## 2014-12-11 ENCOUNTER — Ambulatory Visit: Payer: Medicare PPO | Attending: Nurse Practitioner

## 2014-12-11 DIAGNOSIS — R269 Unspecified abnormalities of gait and mobility: Secondary | ICD-10-CM | POA: Diagnosis not present

## 2014-12-11 DIAGNOSIS — R293 Abnormal posture: Secondary | ICD-10-CM | POA: Diagnosis not present

## 2014-12-11 NOTE — Telephone Encounter (Signed)
I called and LMVM for pt that Dr. Vickey Hugerohmeier stated that pt should not drive.  If there is question to this then she can get a driving evaluation per OT (occupational therapy).   Pt to call back if questions.

## 2014-12-11 NOTE — Patient Instructions (Signed)
   Functional Quadriceps: Sit to Stand   Scoot to edge of chair, move feet behind your knees (your heels will be slightly off the ground) lean forward and feel the weight shift to your toes, then stand up. Perform 5x daily. http://orth.exer.us/735   Copyright  VHI. All rights reserved.  Feet Close Together with eyes open and head motion; with eyes closed standing static   Stand in a corner with a chair in front of you--hold on as needed. Place feet closer together and move your head up and down 10x, then left and right 10x.  Then close your eyes and hold the position for 30 seconds.  Perform daily Copyright  VHI. All rights reserved.    Copyright  VHI. All rights reserved.    Weight Shift: Anterior / Posterior (Limits of Stability)    Stand in a corner with a chair in front of you. Slowly shift weight backward until toes begin to rise off floor. Return to starting position. Shift weight slowly forward until heels begin to rise off floor. Hold each position 2 seconds. Shift 20x each direction. Perform daily, Copyright  VHI. All rights reserved.    "I love a Scientific laboratory technicianarade" Lift   Hold onto your counter as you march forward, pausing 3 seconds with each march. Perform forward and backward, slowly. Perform 3 laps at counter daily, http://gt2.exer.us/345   Copyright  VHI. All rights reserved.    FUNCTIONAL MOBILITY: Heel Walking   Hold onto counter as needed. Walk forward on heels. Perform 3 laps at counter daily, Copyright  VHI. All rights reserved.

## 2014-12-11 NOTE — Therapy (Signed)
[facility] [address] [place], [place], [address] Phone: [phone]   Fax:  [phone]  Physical Therapy Treatment  Patient Details  Name: Diane Richardson MRN: [id number] Date of Birth: 07/18/1951 Referring Provider:  Merri BrunetteSmith, Candace, MD  Encounter Date: 12/11/2014      PT End of Session - 12/11/14 1202    Visit Number 2   Number of Visits 9   Date for PT Re-Evaluation 02/03/15   PT Start Time 1105   PT Stop Time 1150   PT Time Calculation (min) 45 min      Past Medical History  Diagnosis Date  . Fibromyalgia   . PTSD (post-traumatic stress disorder)   . Anxiety   . Depression   . DVT (deep venous thrombosis)   . PE (pulmonary embolism)   . ADD (attention deficit disorder)   . Muscle spasm     bad  . Fatigue     chronic    Past Surgical History  Procedure Laterality Date  . Childbirth  1989    x1,NVD  . Pulmonary embolism surgery  1983    x9 days    There were no vitals filed for this visit.  Visit Diagnosis:  Abnormality of gait  Postural instability      Subjective Assessment - 12/11/14 1107    Subjective Didn't sleep well last night. Not moving as well today.     Theract:  Bed mobility: pt demonstrated independence with rolling right and left, scooting right and left in sitting and supine, and performing supine to sit and sit to supine. However, pt has increased difficulty with getting in and out of the bed due to height of bed. Practiced 3x with independent performance pt stepping laterally onto 4" stool and then sitting on bed. Recommend this for home until she purchases a new bed.   Sit to stand training with emphasis on sequencing and form, especially forward lean.   Neuro re-ed: See balance HEP provided today-pt was taught and provided with the handout today. Pt demonstrated festination and backward loss of balance with retro walking on  heels                          PT Education - 12/11/14 1200    Education provided Yes   Education Details HEP-see pt instructions; recommended a 4" box or step stool or 1/2 cinder block for getting in/out of bed.   Person(s) Educated Patient   Methods Explanation;Handout;Demonstration   Comprehension Verbalized understanding;Returned demonstration             PT Long Term Goals - 12/04/14 1714    PT LONG TERM GOAL #1   Title Pt will perform HEP for balance/transfers/functional strength/gait with family/caregiver supervision (Target date 01/04/15)   Time 4   Period Weeks   Status New   PT LONG TERM GOAL #2   Title Pt will improve Dynamic Gait Index score to at least 19/24 for decreased fall risk.   Time 4   Period Weeks   Status New   PT LONG TERM GOAL #3   Title Pt will improve Berg Balance score to at least 51/56 for decreased fall risk.   Time 4   Period Weeks   Status New   PT LONG TERM GOAL #4   Title Pt will verbalize understanding of fall prevention techniques within home environment.   Time 4   Period Weeks   Status  New   PT LONG TERM GOAL #5   Title Pt will ambulate at least 1000 ft, indoor/outdoor surfaces, independently with no loss of balance, for improved gait in outdoor areas.   Time 4   Period Weeks   Status New   Additional Long Term Goals   Additional Long Term Goals Yes   PT LONG TERM GOAL #6   Title Pt will perform at least 6 of 10 reps of sit<>stand transfers with minimal to no UE support for improved safety and efficiency of transfers.   Time 4   Period Weeks   Status New               Plan - 12/11/14 1203    Clinical Impression Statement Pt demonstrated good bedmobility in session today, but has increased difficulty with getting into bed due to the height of her bed at home. She and her husband are considering buying a lower bed.  She was provided with HEP today. In addition, she demonstrated retropulsion for sit to  stand so emphasis was placed on sequencing and weight shift in sit to stands.   PT Next Visit Plan Use of machines for functional strengthening, balance training, determine of pt needs to stretch calves.        Problem List Patient Active Problem List   Diagnosis Date Noted  . OCD (obsessive compulsive disorder) 10/16/2014  . Delusional disorder 10/16/2014  . Mild neurocognitive disorder 10/10/2014  . Mild benzodiazepine use disorder 10/10/2014  . Anxiety disorder 10/10/2014  . Anxiety 08/29/2014  . H/O deep venous thrombosis 08/29/2014  . Healed or old pulmonary embolism 08/29/2014  . Paranoia    Lamar Laundry, PT,DPT,NCS 12/11/2014 12:14 PM Phone [phone] FAX [phone]         [facility] [address] [place], [place], [address] Phone: [phone]   Fax:  [phone]

## 2014-12-12 ENCOUNTER — Telehealth: Payer: Self-pay | Admitting: Neurology

## 2014-12-12 NOTE — Telephone Encounter (Signed)
Patient called inquiring about a referral to [facility]. I advised her that I did not see it in our system. She seemed kind of confused. She states she didn't understand why she would have a referral there anyway but thinks maybe someone called her about getting a second opinion on her EEG. I advised her I did not see that documentation either and would send a message to the provider so that someone can contact her and help her to understand the next steps

## 2014-12-13 ENCOUNTER — Encounter: Payer: Self-pay | Admitting: Physical Therapy

## 2014-12-13 ENCOUNTER — Ambulatory Visit: Payer: Medicare PPO | Admitting: Physical Therapy

## 2014-12-13 DIAGNOSIS — R269 Unspecified abnormalities of gait and mobility: Secondary | ICD-10-CM | POA: Diagnosis not present

## 2014-12-13 NOTE — Patient Instructions (Signed)
Ankle Bend (Dorsiflexion and Plantar Flexion)   Sitting or lying down, point toes up, keeping both heels on floor. Then press toes to floor, raising heels. Repeat 10 times. Do 2 sessions per day.  http://gt2.exer.us/404   Copyright  VHI. All rights reserved.   Ankle Bend   Sitting straight, move heels back as far as possible. Attempt to bend ankles by lifting toes off floor. Feel stretch on back of calf. Repeat 10 times. Do 2 sessions per day.  http://gt2.exer.us/470   Copyright  VHI. All rights reserved.

## 2014-12-13 NOTE — Telephone Encounter (Signed)
LMVM for pt that returned call.  °

## 2014-12-14 NOTE — Therapy (Signed)
[facility] [address] [place], [place], [address] Phone: [phone]   Fax:  [phone]  Physical Therapy Treatment  Patient Details  Name: Diane LuxChristie M Richardson MRN: [id number] Date of Birth: 07/11/1951 Referring Provider:  Merri BrunetteSmith, Candace, MD  Encounter Date: 12/13/2014      PT End of Session - 12/14/14 1231    Visit Number 3   Number of Visits 9   Date for PT Re-Evaluation 02/03/15   PT Start Time 0935   PT Stop Time 1017   PT Time Calculation (min) 42 min   Activity Tolerance Patient limited by fatigue;Patient limited by lethargy   Behavior [facility] Therapy [facility] for tasks assessed/performed      Past Medical History  Diagnosis Date  . Fibromyalgia   . PTSD (post-traumatic stress disorder)   . Anxiety   . Depression   . DVT (deep venous thrombosis)   . PE (pulmonary embolism)   . ADD (attention deficit disorder)   . Muscle spasm     bad  . Fatigue     chronic    Past Surgical History  Procedure Laterality Date  . Childbirth  1989    x1,NVD  . Pulmonary embolism surgery  1983    x9 days    There were no vitals filed for this visit.  Visit Diagnosis:  Abnormality of gait      Subjective Assessment - 12/13/14 1002    Subjective Didn't sleep well last night.   Pertinent History Delusional, psychiatric history   Patient Stated Goals Pt wants to be more fit and healthy, be able to walk and hike outdoors and safely participate with her horses.   Currently in Pain? Yes   Pain Score 10-Worst pain ever   Pain Location Foot   Pain Orientation Right;Left   Pain Descriptors / Indicators Burning;Shooting   Pain Type Chronic pain   Pain Onset More than a month ago   Pain Frequency Intermittent   Aggravating Factors  walking aggravates   Pain Relieving Factors cream helps some   Multiple Pain Sites Yes   Pain Score 5   Pain Location Hip   Pain Orientation Right;Left   Pain Descriptors / Indicators  Tightness;Aching   Pain Onset In the past 7 days   Pain Frequency Intermittent   Aggravating Factors  sitting   Pain Relieving Factors heating pad                       [facility] Adult PT Treatment/Exercise - 12/14/14 1224    Transfers   Transfers Sit to Stand;Stand to Sit   Sit to Stand 5: Supervision;Without upper extremity assist;From chair/3-in-1;Other/comment  x 10 for strengthening activity   Sit to Stand Details (indicate cue type and reason) verbal cues to lean forward   Stand to Sit 5: Supervision;Without upper extremity assist;To chair/3-in-1   Ambulation/Gait   Ambulation/Gait Yes   Ambulation/Gait Assistance 5: Supervision   Ambulation/Gait Assistance Details No armswing or trunk rotation, forward flexed   Ambulation Distance (Feet) 120 Feet  4 times   Assistive device None   Gait Pattern Decreased step length - right;Decreased step length - left;Decreased dorsiflexion - right;Decreased dorsiflexion - left   Ambulation Surface Level;Indoor   Gait velocity extremely decreased today   Ankle Exercises: Standing   SLS 10 sec x 3 reps each leg   Heel Walk (Round Trip) 8" x 4 reps with UE support   Ankle Exercises: Seated  ABC's 3 reps   Ankle Circles/Pumps 5 reps   Heel Raises 10 reps   Toe Raise 10 reps             Balance Exercises - 12/14/14 1228    Balance Exercises: Standing   Standing Eyes Opened Wide (BOA);Head turns;Solid surface;2 reps;20 secs   Standing Eyes Closed Wide (BOA);Head turns;Solid surface;3 reps;20 secs   Tandem Stance Eyes open;3 reps;10 secs;Upper extremity support 1   Stepping Strategy Anterior;Posterior;10 reps;UE support           PT Education - 12/14/14 1231    Education provided Yes   Education Details HEP   Person(s) Educated Agricultural engineer);Patient   Methods Explanation;Demonstration;Handout   Comprehension Verbalized understanding;Returned demonstration;Need further instruction             PT Long Term  Goals - 12/04/14 1714    PT LONG TERM GOAL #1   Title Pt will perform HEP for balance/transfers/functional strength/gait with family/caregiver supervision (Target date 01/04/15)   Time 4   Period Weeks   Status New   PT LONG TERM GOAL #2   Title Pt will improve Dynamic Gait Index score to at least 19/24 for decreased fall risk.   Time 4   Period Weeks   Status New   PT LONG TERM GOAL #3   Title Pt will improve Berg Balance score to at least 51/56 for decreased fall risk.   Time 4   Period Weeks   Status New   PT LONG TERM GOAL #4   Title Pt will verbalize understanding of fall prevention techniques within home environment.   Time 4   Period Weeks   Status New   PT LONG TERM GOAL #5   Title Pt will ambulate at least 1000 ft, indoor/outdoor surfaces, independently with no loss of balance, for improved gait in outdoor areas.   Time 4   Period Weeks   Status New   Additional Long Term Goals   Additional Long Term Goals Yes   PT LONG TERM GOAL #6   Title Pt will perform at least 6 of 10 reps of sit<>stand transfers with minimal to no UE support for improved safety and efficiency of transfers.   Time 4   Period Weeks   Status New               Plan - 12/14/14 1232    Clinical Impression Statement Pt somewhat lethargic during session and slow to respond.  Needed frequent rest breaks during session.  Uses "Abilify" as reasons why she has difficulty with numerous deficits (balance, speech, strength, etc...)  Continue PT per POC.   Pt will benefit from skilled therapeutic intervention in order to improve on the following deficits Abnormal gait;Decreased activity tolerance;Decreased balance;Decreased mobility;Decreased strength;Difficulty walking   Rehab Potential Good   PT Frequency 2x / week   PT Duration 4 weeks   PT Treatment/Interventions ADLs/Self Care Home Management;Gait training;Stair training;Functional mobility training;Neuromuscular re-education;Balance  training;Therapeutic exercise;Therapeutic activities;Patient/family education   PT Next Visit Plan Work on speed of mobility.  Balance training.  Provide heel cord stretch as HEP in seated position.   Consulted and Agree with Plan of Care Patient;Family member/caregiver   Family Member Consulted Catherine-caregiver        Problem List Patient Active Problem List   Diagnosis Date Noted  . OCD (obsessive compulsive disorder) 10/16/2014  . Delusional disorder 10/16/2014  . Mild neurocognitive disorder 10/10/2014  . Mild benzodiazepine use disorder 10/10/2014  .  Anxiety disorder 10/10/2014  . Anxiety 08/29/2014  . H/O deep venous thrombosis 08/29/2014  . Healed or old pulmonary embolism 08/29/2014  . Paranoia     Newell Coral 12/14/2014, 12:35 PM  [facility] [address] [place], [place], [address] Phone: [phone]   Fax:  [phone]     Newell Coral, [place] [facility] 12/14/2014 12:35 PM Phone: [phone] Fax: [phone]

## 2014-12-14 NOTE — Telephone Encounter (Signed)
I called Diane Richardson and answered her questions about appt with Dr. Karel JarvisAquino.  (which is a second opinion for her abnormal EEG).  I could not answer the question if she may have another EEG, I relayed that would be up to Dr. Karel JarvisAquino.

## 2014-12-14 NOTE — Telephone Encounter (Signed)
Pt is calling back returning your call, please call back.

## 2014-12-18 ENCOUNTER — Ambulatory Visit: Payer: Medicare PPO

## 2014-12-18 DIAGNOSIS — R269 Unspecified abnormalities of gait and mobility: Secondary | ICD-10-CM | POA: Diagnosis not present

## 2014-12-18 NOTE — Therapy (Signed)
[facility] [address] [place], [place], [address] Phone: [phone]   Fax:  [phone]  Physical Therapy Treatment  Patient Details  Name: Diane Richardson MRN: [id number] Date of Birth: 04/27/1951 Referring Provider:  Merri BrunetteSmith, Candace, MD  Encounter Date: 12/18/2014      PT End of Session - 12/18/14 1212    Visit Number 4   Number of Visits 9   Date for PT Re-Evaluation 02/03/15   PT Start Time 1100   PT Stop Time 1145   PT Time Calculation (min) 45 min      Past Medical History  Diagnosis Date  . Fibromyalgia   . PTSD (post-traumatic stress disorder)   . Anxiety   . Depression   . DVT (deep venous thrombosis)   . PE (pulmonary embolism)   . ADD (attention deficit disorder)   . Muscle spasm     bad  . Fatigue     chronic    Past Surgical History  Procedure Laterality Date  . Childbirth  1989    x1,NVD  . Pulmonary embolism surgery  1983    x9 days    There were no vitals filed for this visit.  Visit Diagnosis:  Abnormality of gait      Subjective Assessment - 12/18/14 1109    Subjective Didn't sleep well last night. Reports the Abilify is causing her to have difficulty opening her mouth, feeding herserself, and that she is having freezing episodes while standing and trying to transition.  Also reporting anxiety at this time.    Currently in Pain? No/denies      Standing weight shifting right and left with overhead reach onto cabinets with single UE support -High knee marching 3 laps around therapy mat with MIN A to steady, -Tandem walking 3 laps around therapy mat with MIN A to steady.  -braiding with BUE hand held support and therapist mirroring 4 laps left and right x 10' each.  -Gait training on indoor level surface without assistive device [phone]' with verbal cues for heel strike, increased step length, increased arm swing. -Gait training on treadmill 3 bouts of 2 minutes each  progressing to 1.0 MPH with BUE support and ball for target to increase step length, constant verbal cues for heel strike. Pt unable to maintain good step length and heel strike.   3x30 seconds seated glut stretch each leg with discussion of how this can be performed sitting or in bed--with increasing difficulty/decreased range of motion with each repetition--this is an atypical response   Standing calf stretch at wall x2-3 minutes with pt reporting she already stretches calves in this manner or dropping heels off a step when at home.                               PT Long Term Goals - 12/04/14 1714    PT LONG TERM GOAL #1   Title Pt will perform HEP for balance/transfers/functional strength/gait with family/caregiver supervision (Target date 01/04/15)   Time 4   Period Weeks   Status New   PT LONG TERM GOAL #2   Title Pt will improve Dynamic Gait Index score to at least 19/24 for decreased fall risk.   Time 4   Period Weeks   Status New   PT LONG TERM GOAL #3   Title Pt will improve Berg Balance score to at least 51/56 for decreased fall risk.  Time 4   Period Weeks   Status New   PT LONG TERM GOAL #4   Title Pt will verbalize understanding of fall prevention techniques within home environment.   Time 4   Period Weeks   Status New   PT LONG TERM GOAL #5   Title Pt will ambulate at least 1000 ft, indoor/outdoor surfaces, independently with no loss of balance, for improved gait in outdoor areas.   Time 4   Period Weeks   Status New   Additional Long Term Goals   Additional Long Term Goals Yes   PT LONG TERM GOAL #6   Title Pt will perform at least 6 of 10 reps of sit<>stand transfers with minimal to no UE support for improved safety and efficiency of transfers.   Time 4   Period Weeks   Status New               Plan - 12/18/14 1212    Clinical Impression Statement Pt continues to have difficulty with coordination of gait pattern and arm swing.  Occasionally demonstrates shuffling and "freezing-like" patterns of movement. However, with verbal cue to stop and take a deep breath she is able to reset her gait pattern. This is likely anxiety related as pt reported no pain during session, but high anxiety.    PT Next Visit Plan Balance and speed trainig. Continue to promote deep breathing when pt is feeling flustered or appearing more unsteady.        Problem List Patient Active Problem List   Diagnosis Date Noted  . OCD (obsessive compulsive disorder) 10/16/2014  . Delusional disorder 10/16/2014  . Mild neurocognitive disorder 10/10/2014  . Mild benzodiazepine use disorder 10/10/2014  . Anxiety disorder 10/10/2014  . Anxiety 08/29/2014  . H/O deep venous thrombosis 08/29/2014  . Healed or old pulmonary embolism 08/29/2014  . Paranoia     Marliyah Reid, Lynita Lombard 12/18/2014, 12:15 PM  [facility] [address] [place], [place], [address] Phone: [phone]   Fax:  [phone]

## 2014-12-20 ENCOUNTER — Encounter: Payer: Self-pay | Admitting: Physical Therapy

## 2014-12-20 ENCOUNTER — Ambulatory Visit: Payer: Medicare PPO | Admitting: Physical Therapy

## 2014-12-20 DIAGNOSIS — R269 Unspecified abnormalities of gait and mobility: Secondary | ICD-10-CM

## 2014-12-20 DIAGNOSIS — R293 Abnormal posture: Secondary | ICD-10-CM

## 2014-12-21 ENCOUNTER — Ambulatory Visit (INDEPENDENT_AMBULATORY_CARE_PROVIDER_SITE_OTHER): Payer: Medicare PPO | Admitting: Neurology

## 2014-12-21 ENCOUNTER — Encounter: Payer: Self-pay | Admitting: Neurology

## 2014-12-21 VITALS — BP 116/60 | HR 87 | Ht 61.0 in | Wt 129.7 lb

## 2014-12-21 DIAGNOSIS — R9401 Abnormal electroencephalogram [EEG]: Secondary | ICD-10-CM | POA: Diagnosis not present

## 2014-12-21 DIAGNOSIS — F05 Delirium due to known physiological condition: Secondary | ICD-10-CM

## 2014-12-21 DIAGNOSIS — R410 Disorientation, unspecified: Secondary | ICD-10-CM

## 2014-12-21 NOTE — Therapy (Signed)
[facility] [address] [place], [place], [address] Phone: [phone]   Fax:  [phone]  Physical Therapy Treatment  Patient Details  Name: Diane Richardson MRN: [id number] Date of Birth: Aug 22, 1951 Referring Provider:  Merri Brunette, MD  Encounter Date: 12/20/2014      PT End of Session - 12/21/14 1027    Visit Number 5   Number of Visits 9   Date for PT Re-Evaluation 02/03/15   Authorization Type [employer] approved 6 visits from 3/28-5/12/16     PT Start Time 1019   PT Stop Time 1100   PT Time Calculation (min) 41 min   Activity Tolerance Patient limited by fatigue;Patient limited by lethargy  Pt needed frequent rest breaks during PT session   Behavior During Therapy [facility] for tasks assessed/performed      Past Medical History  Diagnosis Date  . Fibromyalgia   . PTSD (post-traumatic stress disorder)   . Anxiety   . Depression   . DVT (deep venous thrombosis)   . PE (pulmonary embolism)   . ADD (attention deficit disorder)   . Muscle spasm     bad  . Fatigue     chronic    Past Surgical History  Procedure Laterality Date  . Childbirth  1989    x1,NVD  . Pulmonary embolism surgery  1983    x9 days    There were no vitals filed for this visit.  Visit Diagnosis:  Abnormality of gait  Postural instability      Subjective Assessment - 12/20/14 1025    Subjective Pt reports she slept better last night.  Had one fall yesterday and another one this morning.  Reports this mornings fall due to dog knocking her down.  Feels like she might not do as well today.   Pertinent History Delusional, psychiatric history   Patient Stated Goals Pt wants to be more fit and healthy, be able to walk and hike outdoors and safely participate with her horses.   Currently in Pain? No/denies                       [facility] Adult PT Treatment/Exercise - 12/21/14 0843    Transfers   Transfers Sit to  Stand;Stand to Sit   Sit to Stand 5: Supervision   Sit to Stand Details (indicate cue type and reason) verbal cues to lean forward   Stand to Sit 5: Supervision;Without upper extremity assist;To chair/3-in-1   Ambulation/Gait   Ambulation/Gait Yes   Ambulation/Gait Assistance 5: Supervision   Ambulation/Gait Assistance Details forward flexed, no arm swing,shuffles   Ambulation Distance (Feet) 120 Feet  three times   Assistive device None   Gait Pattern Decreased step length - right;Decreased step length - left;Decreased dorsiflexion - right;Decreased dorsiflexion - left   Ambulation Surface Level;Indoor   Gait Comments Also amb on treadmill x 3 minutes working on heel strike and increased step length.  Pt relying significantly on UE support.   High Level Balance   High Level Balance Activities Other (comment)  forward, backward and forward/backward weight shifts   High Level Balance Comments at counter with UE support                     PT Long Term Goals - 12/04/14 1714    PT LONG TERM GOAL #1   Title Pt will perform HEP for balance/transfers/functional strength/gait with family/caregiver supervision (Target date 01/04/15)   Time  4   Period Weeks   Status New   PT LONG TERM GOAL #2   Title Pt will improve Dynamic Gait Index score to at least 19/24 for decreased fall risk.   Time 4   Period Weeks   Status New   PT LONG TERM GOAL #3   Title Pt will improve Berg Balance score to at least 51/56 for decreased fall risk.   Time 4   Period Weeks   Status New   PT LONG TERM GOAL #4   Title Pt will verbalize understanding of fall prevention techniques within home environment.   Time 4   Period Weeks   Status New   PT LONG TERM GOAL #5   Title Pt will ambulate at least 1000 ft, indoor/outdoor surfaces, independently with no loss of balance, for improved gait in outdoor areas.   Time 4   Period Weeks   Status New   Additional Long Term Goals   Additional Long Term  Goals Yes   PT LONG TERM GOAL #6   Title Pt will perform at least 6 of 10 reps of sit<>stand transfers with minimal to no UE support for improved safety and efficiency of transfers.   Time 4   Period Weeks   Status New               Plan - 12/21/14 1028    Clinical Impression Statement Pt continues with slowed movements.  Pt to see neurologist for second opinion.  Continue PT per POC.   Pt will benefit from skilled therapeutic intervention in order to improve on the following deficits Abnormal gait;Decreased activity tolerance;Decreased balance;Decreased mobility;Decreased strength;Difficulty walking   Rehab Potential Good   PT Frequency 2x / week   PT Duration 4 weeks   PT Treatment/Interventions ADLs/Self Care Home Management;Gait training;Stair training;Functional mobility training;Neuromuscular re-education;Balance training;Therapeutic exercise;Therapeutic activities;Patient/family education   PT Next Visit Plan Balance and increasing speed of movements.   Consulted and Agree with Plan of Care Patient;Family member/caregiver   Family Member Consulted Catherine-caregiver        Problem List Patient Active Problem List   Diagnosis Date Noted  . OCD (obsessive compulsive disorder) 10/16/2014  . Delusional disorder 10/16/2014  . Mild neurocognitive disorder 10/10/2014  . Mild benzodiazepine use disorder 10/10/2014  . Anxiety disorder 10/10/2014  . Anxiety 08/29/2014  . H/O deep venous thrombosis 08/29/2014  . Healed or old pulmonary embolism 08/29/2014  . Paranoia     Newell CoralRobertson, Denise Terry 12/21/2014, 10:30 AM  [facility] [address] [place], [place], [address] Phone: [phone]   Fax:  [phone]     Newell CoralDenise Terry [place], [place] [facility] 12/21/2014 10:31 AM Phone: [phone] Fax: [phone]

## 2014-12-21 NOTE — Patient Instructions (Signed)
1. Schedule 1-hour EEG 2. Refer to Dr. Leonides CaveZelson for repeat Neuropsychological evaluation 3. Our office will call with results, follow-up with Dr. Vickey Hugerohmeier as scheduled
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
## 2014-12-25 ENCOUNTER — Ambulatory Visit: Payer: Medicare PPO

## 2014-12-25 DIAGNOSIS — R293 Abnormal posture: Secondary | ICD-10-CM

## 2014-12-25 DIAGNOSIS — R269 Unspecified abnormalities of gait and mobility: Secondary | ICD-10-CM | POA: Diagnosis not present

## 2014-12-25 NOTE — Therapy (Signed)
[facility] [address] [place], [place], [address] Phone: [phone]   Fax:  [phone]  Physical Therapy Treatment  Patient Details  Name: Diane Richardson MRN: [id number] Date of Birth: 07/26/1951 Referring Provider:  Merri BrunetteSmith, Candace, MD  Encounter Date: 12/25/2014      PT End of Session - 12/25/14 1231    Visit Number 6   Number of Visits 9   Date for PT Re-Evaluation 02/03/15   Authorization Type [employer] approved 6 visits from 3/28-5/12/16     PT Start Time [id number]  pt arrived late   PT Stop Time 1017   PT Time Calculation (min) 35 min      Past Medical History  Diagnosis Date  . Fibromyalgia   . PTSD (post-traumatic stress disorder)   . Anxiety   . Depression   . DVT (deep venous thrombosis)   . PE (pulmonary embolism)   . ADD (attention deficit disorder)   . Muscle spasm     bad  . Fatigue     chronic    Past Surgical History  Procedure Laterality Date  . Childbirth  1989    x1,NVD  . Pulmonary embolism surgery  1983    x9 days    There were no vitals filed for this visit.  Visit Diagnosis:  Abnormality of gait  Postural instability      Subjective Assessment - 12/25/14 0945    Subjective Pt reports she had another fall last week when she dropped something on the ground and had to pick it up, and and lost balance in a left posterolateral direction. Did not hit her head.    Currently in Pain? No/denies        Pt requested to trial nordic poles today.  Gait training with nordic poles x 18 minutes with verbal cues and therapist demo for proper coordination of reciprocal UE/LE motion, audible cues for tempo. Pt noted to have increased stride length and confidence/stability with poles.   Gait speed without device: 2.54 ft/sec Gait speed with poles: 2.65 ft/sec    Balance training in parallel bars-emphasis on balance with squatting/bending over as pt reports recent fall occurring  while bending over. -squats on airex with verbal cues and demo for BUE utilization for hip strategy -squats on compliant balance beam for increased challenge -standing on airex, gathering bean bags from floor in front, one at a time, straightening up and tossing into basket. Maximal verbal cues to prevent pt from using unnecessary support with pt stating "I can't do it." Pt was able to accomplish task with verbal cues.                              PT Long Term Goals - 12/04/14 1714    PT LONG TERM GOAL #1   Title Pt will perform HEP for balance/transfers/functional strength/gait with family/caregiver supervision (Target date 01/04/15)   Time 4   Period Weeks   Status New   PT LONG TERM GOAL #2   Title Pt will improve Dynamic Gait Index score to at least 19/24 for decreased fall risk.   Time 4   Period Weeks   Status New   PT LONG TERM GOAL #3   Title Pt will improve Berg Balance score to at least 51/56 for decreased fall risk.   Time 4   Period Weeks   Status New   PT LONG TERM GOAL #4  Title Pt will verbalize understanding of fall prevention techniques within home environment.   Time 4   Period Weeks   Status New   PT LONG TERM GOAL #5   Title Pt will ambulate at least 1000 ft, indoor/outdoor surfaces, independently with no loss of balance, for improved gait in outdoor areas.   Time 4   Period Weeks   Status New   Additional Long Term Goals   Additional Long Term Goals Yes   PT LONG TERM GOAL #6   Title Pt will perform at least 6 of 10 reps of sit<>stand transfers with minimal to no UE support for improved safety and efficiency of transfers.   Time 4   Period Weeks   Status New               Plan - 12/25/14 1232    Clinical Impression Statement Pt ambulated with improved step length and arm swing using nordic poles today. Gait speed with nordic poles vs without was not a substantial difference.   PT Next Visit Plan continue gait training  with nordic poles, continue balance with picking up objects from the ground        Problem List Patient Active Problem List   Diagnosis Date Noted  . OCD (obsessive compulsive disorder) 10/16/2014  . Delusional disorder 10/16/2014  . Mild neurocognitive disorder 10/10/2014  . Mild benzodiazepine use disorder 10/10/2014  . Anxiety disorder 10/10/2014  . Anxiety 08/29/2014  . H/O deep venous thrombosis 08/29/2014  . Healed or old pulmonary embolism 08/29/2014  . Paranoia    Lamar Laundry, PT,DPT,NCS 12/25/2014 2:33 PM Phone [phone] FAX [phone]         [facility] [address] [place], [place], [address] Phone: [phone]   Fax:  [phone]

## 2014-12-27 ENCOUNTER — Ambulatory Visit (INDEPENDENT_AMBULATORY_CARE_PROVIDER_SITE_OTHER): Payer: Medicare PPO

## 2014-12-27 ENCOUNTER — Ambulatory Visit (INDEPENDENT_AMBULATORY_CARE_PROVIDER_SITE_OTHER): Payer: Medicare PPO | Admitting: Podiatrist

## 2014-12-27 ENCOUNTER — Encounter: Payer: Self-pay | Admitting: Podiatrist

## 2014-12-27 ENCOUNTER — Ambulatory Visit: Payer: Medicare PPO | Admitting: Physical Therapy

## 2014-12-27 ENCOUNTER — Encounter: Payer: Self-pay | Admitting: Neurology

## 2014-12-27 VITALS — BP 148/80 | HR 80 | Resp 12 | Ht 61.0 in | Wt 137.0 lb

## 2014-12-27 DIAGNOSIS — M2042 Other hammer toe(s) (acquired), left foot: Secondary | ICD-10-CM | POA: Diagnosis not present

## 2014-12-27 DIAGNOSIS — M2012 Hallux valgus (acquired), left foot: Secondary | ICD-10-CM

## 2014-12-27 DIAGNOSIS — M79673 Pain in unspecified foot: Secondary | ICD-10-CM

## 2014-12-27 DIAGNOSIS — L84 Corns and callosities: Secondary | ICD-10-CM

## 2014-12-27 DIAGNOSIS — M79672 Pain in left foot: Secondary | ICD-10-CM | POA: Diagnosis not present

## 2014-12-27 DIAGNOSIS — M21612 Bunion of left foot: Secondary | ICD-10-CM

## 2014-12-27 NOTE — Patient Instructions (Signed)
Bunionectomy A bunionectomy is surgery to remove a bunion. A bunion is an enlargement of the joint at the base of the big toe. It is made up of bone and soft tissue on the inside part of the joint. Over time, a painful lump appears on the inside of the joint. The big toe begins to point inward toward the second toe. New bone growth can occur and a bone spur may form. The pain eventually causes difficulty walking. A bunion usually results from inflammation caused by the irritation of poorly fitting shoes. It often begins later in life. A bunionectomy is performed when nonsurgical treatment no longer works. When surgery is needed, the extent of the procedure will depend on the degree of deformity of the foot. Your surgeon will discuss with you the different procedures and what will work best for you depending on your age and health. LET YOUR CAREGIVER KNOW ABOUT:   Previous problems with anesthetics or medicines used to numb the skin.  Allergies to dyes, iodine, foods, and/or latex.  Medicines taken including herbs, eye drops, prescription medicines (especially medicines used to "thin the blood"), aspirin and other over-the-counter medicines, and steroids (by mouth or as a cream).  History of bleeding or blood problems.  Possibility of pregnancy, if this applies.  History of blood clots in your legs and/or lungs .  Previous surgery.  Other important health problems. RISKS AND COMPLICATIONS   Infection.  Pain.  Nerve damage.  Possibility that the bunion will recur. BEFORE THE PROCEDURE  You should be present 60 minutes prior to your procedure or as directed.  PROCEDURE  Surgery is often done so that you can go home the same day (outpatient). It may be done in a hospital or in an outpatient surgical center. An anesthetic will be used to help you sleep during the procedure. Sometimes, a spinal anesthetic is used to make you numb below the waist. A cut (incision) is made over the swollen  area at the first joint of the big toe. The enlarged lump will be removed. If there is a need to reposition the bones of the big toe, this may require more than 1 incision. The bone itself may need to be cut. Screws and wires may be used in the repair. These can be removed at a later date. In severe cases, the entire joint may need to be removed and a joint replacement inserted. When done, the incision is closed with stitches (sutures). Skin adhesive strips may be added for reinforcement. They help hold the incision closed.  AFTER THE PROCEDURE  Compression bandages (dressings) are then wrapped around the wound. This helps to keep the foot in alignment and reduce swelling. Your foot will be monitored for bleeding and swelling. You will need to stay for a few hours in the recovery area before being discharged. This allows time for the anesthesia to wear off. You will be discharged home when you are awake, stable, and doing well. HOME CARE INSTRUCTIONS   You can expect to return to normal activities within 6 to 8 weeks after surgery. The foot is at increased risk for swelling for several months. When you can expect to bear weight on the operated foot will depend on the extent of your surgery. The milder the deformity, the less tissue is removed and the sooner the return to normal activity level. During the recovery period, a special shoe, boot, or cast may be worn to accommodate the surgical bandage and to help provide stability   to the foot.  Once you are home, an ice pack applied to the operative site may help with discomfort and keep swelling down. Stop using the ice if it causes discomfort.  Keep your feet raised (elevated) when possible to lessen swelling.  If you have an elastic bandage on your foot and you have numbness, tingling, or your foot becomes cold and blue, adjust the bandage to make it comfortable.  Change dressings as directed.  Keep the wound dry and clean. The wound may be washed  gently with soap and water. Gently blot dry without rubbing. Do not take baths or use swimming pools or hot tubs for 10 days, or as instructed by your caregiver.  Only take over-the-counter or prescription medicines for pain, discomfort, or fever as directed by your caregiver.  You may continue a normal diet as directed.  For activity, use crutches with no weight bearing or your orthopedic shoe as directed. Continue to use crutches or a cane as directed until you can stand without causing pain. SEEK MEDICAL CARE IF:   You have redness, swelling, bruising, or increasing pain in the wound.  There is pus coming from the wound.  You have drainage from a wound lasting longer than 1 day.  You have an oral temperature above 102 F (38.9 C).  You notice a bad smell coming from the wound or dressing.  The wound breaks open after sutures have been removed.  You develop dizzy episodes or fainting while standing.  You have persistent nausea or vomiting.  Your toes become cold.  Pain is not relieved with medicines. SEEK IMMEDIATE MEDICAL CARE IF:   You develop a rash.  You have difficulty breathing.  You develop any reaction or side effects to medicines given.  Your toes are numb or blue, or you have severe pain. MAKE SURE YOU:   Understand these instructions.  Will watch your condition.  Will get help right away if you are not doing well or get worse. Document Released: 08/08/2005 Document Revised: 11/17/2011 Document Reviewed: 09/13/2007 [facility] Patient Information 2015 [place], [place]. This information is not intended to replace advice given to you by your health care provider. Make sure you discuss any questions you have with your health care provider.   Corns and Calluses Corns are small areas of thickened skin that usually occur on the top, sides, or tip of a toe. They contain a cone-shaped core with a point that can press on a nerve below. This causes pain. Calluses are  areas of thickened skin that usually develop on hands, fingers, palms, soles of the feet, and heels. These are areas that experience frequent friction or pressure. CAUSES  Corns are usually the result of rubbing (friction) or pressure from shoes that are too tight or do not fit properly. Calluses are caused by repeated friction and pressure on the affected areas. SYMPTOMS  A hard growth on the skin.  Pain or tenderness under the skin.  Sometimes, redness and swelling.  Increased discomfort while wearing tight-fitting shoes. DIAGNOSIS  Your caregiver can usually tell what the problem is by doing a physical exam. TREATMENT  Removing the cause of the friction or pressure is usually the only treatment needed. However, sometimes medicines can be used to help soften the hardened, thickened areas. These medicines include salicylic acid plasters and 12% ammonium lactate lotion. These medicines should only be used under the direction of your caregiver. HOME CARE INSTRUCTIONS   Try to remove pressure from the affected area.  You may wear donut-shaped corn pads to protect your skin.  You may use a pumice stone or nonmetallic nail file to gently reduce the thickness of a corn.  Wear properly fitted footwear.  If you have calluses on the hands, wear gloves during activities that cause friction.  If you have diabetes, you should regularly examine your feet. Tell your caregiver if you notice any problems with your feet. SEEK IMMEDIATE MEDICAL CARE IF:   You have increased pain, swelling, redness, or warmth in the affected area.  Your corn or callus starts to drain fluid or bleeds.  You are not getting better, even with treatment. Document Released: 05/31/2004 Document Revised: 11/17/2011 Document Reviewed: 04/22/2011 [facility] Patient Information 2015 [place]. This information is not intended to replace advice given to you by your health care provider. Make sure you discuss any questions  you have with your health care provider.

## 2014-12-27 NOTE — Progress Notes (Signed)
   Subjective:    Patient ID: Diane Richardson, female    DOB: 05/12/1951, 64 y.o.   MRN: [id number]  HPI   Chief Complaint  Patient presents with  . Hammer Toe    "This toe is so painful I can hardly walk." left 2nd medial toe corn - over 6 months - Pt states she seems to have begun to feel the pain more since she went off her fibromyalgia medication  . Bunions    "The bunion seems to create a lot of pain in how the toe points." left 1st toe abducts into the left 2nd toe  . Toe Injury    "I have an injury from where I stepped on a construction staple." left1st plantar toe        Review of Systems  Constitutional: Positive for appetite change, fatigue and unexpected weight change.  HENT: Positive for congestion and trouble swallowing.   Eyes: Positive for itching.  Musculoskeletal: Positive for myalgias, back pain, arthralgias and gait problem.       Pt states the last pedicure she received the pedicurist debrided the feet so severely a rash developed.  Allergic/Immunologic: Positive for environmental allergies.  Neurological: Positive for tremors, weakness and numbness.  Hematological: Bruises/bleeds easily.  Psychiatric/Behavioral: Positive for behavioral problems and confusion. The patient is nervous/anxious.   All other systems reviewed and are negative.      Objective:   Physical Exam Vascular status is intact with pedal pulses palpable at 2/4 DP and PT bilateral. Capillary refill time is within normal limits. Neurological sensation is intact via Semmes once the monofilament 5 out of 5 sites bilateral. Light touch sensation are also intact bilateral. Musculoskeletal examination reveals bunion with hallux valgus deformity on the left. A contracture hammertoe deformity is also present. On the right she's had a previous bunion surgery. Right second toe is contracted mildly. Left second toe medially has a painful callus present. Intact integument is present post debridement. Left  second toenail is dystrophic and a pincer type nail deformity. Pain with debridement is also noted. Hyperkeratotic calluses present on the medial aspect of the hallux and first tarsal had left as well. No sign of puncture wound or injury on the plantar aspect of the left hallux is seen. If there was one it has since resolved and healed.      Assessment & Plan:  Bunion, second digit hammertoe, dystrophic second toenail, callus  Plan: Debrided the callus without complication. Recommended a digital pad to prevent this from recurring. Discussed conservative versus surgical treatment of the bunion and hammertoe. Discussed at this time I will wait as long as possible before fixing these conditions. She states in the past she's a very slow healer and it took her right foot 6 months or more to heal from her previous bunion surgery.

## 2014-12-28 DIAGNOSIS — M797 Fibromyalgia: Secondary | ICD-10-CM | POA: Diagnosis not present

## 2014-12-28 DIAGNOSIS — Z87898 Personal history of other specified conditions: Secondary | ICD-10-CM | POA: Diagnosis not present

## 2014-12-28 DIAGNOSIS — R251 Tremor, unspecified: Secondary | ICD-10-CM | POA: Diagnosis not present

## 2014-12-28 DIAGNOSIS — G4700 Insomnia, unspecified: Secondary | ICD-10-CM | POA: Diagnosis not present

## 2015-01-01 ENCOUNTER — Ambulatory Visit: Payer: Medicare PPO

## 2015-01-01 DIAGNOSIS — R293 Abnormal posture: Secondary | ICD-10-CM

## 2015-01-01 DIAGNOSIS — R269 Unspecified abnormalities of gait and mobility: Secondary | ICD-10-CM | POA: Diagnosis not present

## 2015-01-01 NOTE — Therapy (Signed)
[facility] [place] [address] [place], [place], [address] Phone: [phone]   Fax:  [phone]  Physical Therapy Treatment  Patient Details  Name: Diane Richardson MRN: [id number] Date of Birth: Jun 28, 1951 Referring Provider:  Merri Brunette, MD  Encounter Date: 01/01/2015      PT End of Session - 01/01/15 1405    Visit Number 7   Number of Visits 9   Date for PT Re-Evaluation 02/03/15   Authorization Type [employer] approved 6 visits from 3/28-5/12/16     PT Start Time 1108   PT Stop Time 1147   PT Time Calculation (min) 39 min      Past Medical History  Diagnosis Date  . Fibromyalgia   . PTSD (post-traumatic stress disorder)   . Anxiety   . Depression   . DVT (deep venous thrombosis)   . PE (pulmonary embolism)   . ADD (attention deficit disorder)   . Muscle spasm     bad  . Fatigue     chronic    Past Surgical History  Procedure Laterality Date  . Childbirth  1989    x1,NVD  . Pulmonary embolism surgery  1983    x9 days  . Foot surgery Right     pt states she had bunion surgery right foot.    There were no vitals filed for this visit.  Visit Diagnosis:  Postural instability      Subjective Assessment - 01/01/15 1110    Subjective Pt reports she felt that picking things up off the floor was easier after last session. Also, husband bought a step and pt would like to practice using a step to get into bed again today.   Currently in Pain? No/denies      Self Care x11 minutes-Pt discussed her MD order for aquatic therapy and asked what this is. Therapist answered her questions and assisted pt with finding an aquatic therapy clinic near pt's home. Address and phone number provided for that clinic with instructions to wait until d/c from this PT clinic prior to starting aquatic therapy for insurance coverage purposes. Pt verbalized understanding.  Transferred onto bed using aerobic step with  supervision  Sit to supine with MIN A to lift RLE onto bed; supine to sit with supervision and extra time required  Rolling training as in PWR! Supine trunk rotation with visual cue, tactile cues verbal cues to attempt to intiate movement with unilateral hip extension with significant difficulty performing this task. Trialed bridging then single leg bridge in effort to coordinate task.                                PT Long Term Goals - 12/04/14 1714    PT LONG TERM GOAL #1   Title Pt will perform HEP for balance/transfers/functional strength/gait with family/caregiver supervision (Target date 01/04/15)   Time 4   Period Weeks   Status New   PT LONG TERM GOAL #2   Title Pt will improve Dynamic Gait Index score to at least 19/24 for decreased fall risk.   Time 4   Period Weeks   Status New   PT LONG TERM GOAL #3   Title Pt will improve Berg Balance score to at least 51/56 for decreased fall risk.   Time 4   Period Weeks   Status New   PT LONG TERM GOAL #4   Title Pt will verbalize  understanding of fall prevention techniques within home environment.   Time 4   Period Weeks   Status New   PT LONG TERM GOAL #5   Title Pt will ambulate at least 1000 ft, indoor/outdoor surfaces, independently with no loss of balance, for improved gait in outdoor areas.   Time 4   Period Weeks   Status New   Additional Long Term Goals   Additional Long Term Goals Yes   PT LONG TERM GOAL #6   Title Pt will perform at least 6 of 10 reps of sit<>stand transfers with minimal to no UE support for improved safety and efficiency of transfers.   Time 4   Period Weeks   Status New               Plan - 01/01/15 1406    Clinical Impression Statement Pt demonstrated safe ability to step up onto 6" step to get into elevated "bed" (therapy mat). However her bed mobility is severely impaired due to lack of coordination at times, pt cannot dissociate RLE movement from LLE movement  making  rolling very challenging. Will plan to wrap up after next two sessions as originally planned with hopes that pt receives further explanation and medical management of symptoms  or continued benefit when she begins aquatic theapy. Pt's performance is inconsistent at this time.   Consulted and Agree with Plan of Care Patient        Problem List Patient Active Problem List   Diagnosis Date Noted  . OCD (obsessive compulsive disorder) 10/16/2014  . Delusional disorder 10/16/2014  . Mild neurocognitive disorder 10/10/2014  . Mild benzodiazepine use disorder 10/10/2014  . Anxiety disorder 10/10/2014  . Anxiety 08/29/2014  . H/O deep venous thrombosis 08/29/2014  . Healed or old pulmonary embolism 08/29/2014  . Paranoia     Lamar LaundryJennifer Arneta Mahmood, PT,DPT,NCS 01/01/2015 2:13 PM Phone [phone] FAX [phone]         [facility] [address] [place], [place], [address] Phone: [phone]   Fax:  [phone]

## 2015-01-02 ENCOUNTER — Ambulatory Visit (INDEPENDENT_AMBULATORY_CARE_PROVIDER_SITE_OTHER): Payer: Medicare PPO | Admitting: Neurology

## 2015-01-02 ENCOUNTER — Telehealth: Payer: Self-pay | Admitting: Neurology

## 2015-01-02 DIAGNOSIS — R9401 Abnormal electroencephalogram [EEG]: Secondary | ICD-10-CM

## 2015-01-02 DIAGNOSIS — F05 Delirium due to known physiological condition: Secondary | ICD-10-CM | POA: Diagnosis not present

## 2015-01-02 DIAGNOSIS — F09 Unspecified mental disorder due to known physiological condition: Secondary | ICD-10-CM

## 2015-01-02 DIAGNOSIS — R410 Disorientation, unspecified: Secondary | ICD-10-CM

## 2015-01-02 NOTE — Telephone Encounter (Signed)
Called pt's husband back to get more details. He says she is having a hard time walking, "muscle issues" which he describes as shaking and as if her muscles are "fighting each other". Pt's husband thinks it may be a reaction to her meds (she is taking depakote, ability, ativan, trazodone). Pt had an EEG today (4/26) with Dr. Karel JarvisAquino, and had PT yesterday. The Physical Therapist recommended (per husband) a "movement analysis". Pt's husband wants Dr. Vickey Hugerohmeier to talk to Dr. Donell BeersPlovsky (pt's psychiatrist) and "get the doctors on the same page". He is also wondering if he should take her in to see Dr. Vickey Hugerohmeier. Please advise. Will make an appt for patient if you believe she needs one here.

## 2015-01-02 NOTE — Telephone Encounter (Signed)
Pt's spouse is calling stating pt is having a very difficult time walking and with any basic movement and he would like to discuss this matter with the doctor.  He states she is physically unstable.  Please call and advise.

## 2015-01-03 ENCOUNTER — Ambulatory Visit: Payer: Medicare PPO | Admitting: Physical Therapy

## 2015-01-04 ENCOUNTER — Telehealth: Payer: Self-pay | Admitting: Family Medicine

## 2015-01-04 NOTE — Telephone Encounter (Signed)
-----   Message from Van ClinesKaren M Aquino, MD sent at 01/04/2015 12:47 PM EDT ----- Pls let her know EEG is normal, results have been forwarded to Dr. Vickey Hugerohmeier for review. Thanks

## 2015-01-04 NOTE — Telephone Encounter (Signed)
Lmovm to return my call. 

## 2015-01-04 NOTE — Procedures (Signed)
ELECTROENCEPHALOGRAM REPORT  Date of Study: 01/02/2015  Patient's Name: Diane Richardson M Band MRN: [id number] Date of Birth: 1951-02-06  Referring Provider: Dr. Patrcia DollyKaren Jahdiel Krol  Clinical History: This is a 64 year old woman with episodes of altered mental status with paranoia. Prior EEG had shown frontal slowing, however there was significant artifact.  Medications: Depakote, Ativan, Abilify  Technical Summary: A multichannel digital 1-hour sleep-deprived EEG recording measured by the international 10-20 system with electrodes applied with paste and impedances below 5000 ohms performed in our laboratory with EKG monitoring in an awake and asleep patient.  Hyperventilation and photic stimulation were performed.  The digital EEG was referentially recorded, reformatted, and digitally filtered in a variety of bipolar and referential montages for optimal display.    Description: The patient is awake and asleep during the recording.  During maximal wakefulness, there is a symmetric, medium voltage 8 Hz posterior dominant rhythm that attenuates with eye opening.  The record is symmetric.  During drowsiness and sleep, there is an increase in theta slowing of the background with shifting asymmetry seen over the bilateral temporal regions, left more than right.  Vertex waves and symmetric sleep spindles were seen.  Hyperventilation and photic stimulation did not elicit any abnormalities.  There were no epileptiform discharges or electrographic seizures seen.    EKG lead was unremarkable.  Impression: This 1-hour awake and asleep EEG is within normal limits for age.  Clinical Correlation: A normal EEG does not exclude a clinical diagnosis of epilepsy. Clinical correlation is advised.   Patrcia DollyKaren Cottrell Gentles, M.D.

## 2015-01-04 NOTE — Progress Notes (Signed)
I agree with the assessment and plan as directed by Dr. Karel JarvisAquino.   Ayasha Ellingsen, MD

## 2015-01-05 NOTE — Telephone Encounter (Signed)
Spoke with patient husband he is aware of her results however he is still concerned that she is still continuing to have a hard time walking and doing basic daily things he thinks it many have something to do with her medication but is not 100 percent sure. He would like for her to be seen but not sure if she should come here or Dr. Vickey Hugerohmeier please advise

## 2015-01-05 NOTE — Telephone Encounter (Signed)
Patient notified

## 2015-01-05 NOTE — Telephone Encounter (Signed)
Pt husband Kathlene NovemberMike is returning Tiffany call please call [phone]

## 2015-01-05 NOTE — Telephone Encounter (Signed)
Would follow-up with Dr. Vickey Hugerohmeier. She had been referred here for the EEG, results have been sent to Dr. Vickey Hugerohmeier for review as well. Thanks

## 2015-01-05 NOTE — Telephone Encounter (Signed)
Patient husbands called back and would like to speak with someone about his wife, says she is getting worse and isnt sure what he should do. Would like to possibly make an appt. For her soon. Please call and advise.

## 2015-01-08 NOTE — Telephone Encounter (Signed)
Spoke to Dr. Vickey Hugerohmeier about pt's decline, she ordered a LP. Called Casimiro NeedleMichael, husband, to inform him of LP order and to inform him that [facility] for [facility]. Asked that they use his cell phone number to call and make appt at [phone].

## 2015-01-08 NOTE — Telephone Encounter (Addendum)
Per Dr. Vickey Hugerohmeier , would like to get LP ([facility]) to rule out rapid progressive dementia.  I called and LMVM for  [name] at [facility] to let her know order in system.  Please call husband at [phone].

## 2015-01-09 ENCOUNTER — Telehealth: Payer: Self-pay | Admitting: Neurology

## 2015-01-09 ENCOUNTER — Telehealth: Payer: Self-pay | Admitting: *Deleted

## 2015-01-09 NOTE — Telephone Encounter (Signed)
See other note

## 2015-01-09 NOTE — Telephone Encounter (Signed)
I spoke with husband.  Relayed the information about [facility] labs as per below. I will LM on his cell # with phones # for [facility] [phone].Duwayne Heck([name]) contact and also [facility].  [phone].   Pts husband also requesting ([employer]?) that would like an order written for Home Care (supervision) for wife (8hours daily).  If can be back dated when pt seen in office.    I told him that when placed in system it is dated for that date.  Also wanted to get order for 6 more sessions of PT (goes to Neuro [facility]hab , [place]).  Please advise.

## 2015-01-09 NOTE — Telephone Encounter (Signed)
I LMVM for [name] with [facility] about additional lab work requested for this pt of Dr. Oliva Bustardohmeier's.  [facility].  (uses [facility] to draw).

## 2015-01-09 NOTE — Telephone Encounter (Signed)
Patients husband returned call and would like to speak with you.

## 2015-01-09 NOTE — Telephone Encounter (Signed)
I called and spoke to [name] at [facility].  She had spoken to wife.   I told her that she needs to speak with husband and I gave her husband cell# about scheduling the LP.  I did tell her as well that Dr. Vickey Hugerohmeier ordered additional TAU (code 26178) [facility].  Auth to be done prior to sending.  Will call her with information.

## 2015-01-09 NOTE — Telephone Encounter (Signed)
I called and spoke to [facility].  They are out of network for both [employer] and [employer]. Cost of test 178 ([employer] ALZ Eval) is $2575.00.  [facility] faxed requisition form to [id number], needing prior to speciman draw to speak to husband to see about qualifying for financial assistance and then to proceed if decide to do test. I LMVM for husband on his cell #.  (about this and related that I would fax form).  Pt to call back and speak to [place]en or myself.

## 2015-01-09 NOTE — Telephone Encounter (Signed)
Patients husband called and would like to speak with someone regarding her. Please call and advise.

## 2015-01-10 ENCOUNTER — Telehealth: Payer: Self-pay | Admitting: Nurse Practitioner

## 2015-01-10 ENCOUNTER — Telehealth: Payer: Self-pay | Admitting: Neurology

## 2015-01-10 ENCOUNTER — Encounter: Payer: Medicare PPO | Admitting: Physical Therapy

## 2015-01-10 NOTE — Telephone Encounter (Signed)
French Anaracy from [employer] called stating that Mr. Diane Richardson needs the ICD-10 code for the lab work patient needs to have that has to be sent out of state.  French Anaracy would like for someone to call Mr. Diane Richardson and give him the IC-10 code. Please call and advice patient # [phone]

## 2015-01-10 NOTE — Telephone Encounter (Signed)
Kathlene NovemberMike, pt's husband called/returning Sandra's call. Kathlene NovemberMike can be reached @ [phone]

## 2015-01-10 NOTE — Telephone Encounter (Signed)
Called Janna back from [facility] and gave ICD 10 code of F03.90. Asked her to call back with any questions or concerns.

## 2015-01-10 NOTE — Telephone Encounter (Signed)
[name] called and stated that the patient is coming in tomorrow and she would like to verify the athena test that was ordered, whether it was 178 or 179. Please call and advise.

## 2015-01-10 NOTE — Telephone Encounter (Signed)
Edmon CrapeJanna from [facility] [phone]) called and needs the diagnosis code and needs to speak with someone about LP procedure. Please call and advise.

## 2015-01-10 NOTE — Telephone Encounter (Signed)
Patients husband has called numerous time. Said that you called him and he is returning call, he is very upset and would like to speak with someone ASAP. Please call and advise.

## 2015-01-10 NOTE — Telephone Encounter (Signed)
I spoke to Diane Richardson at [facility].  Will fax form to her as well as ABN forms.  Also let her know about the athena diag, that husband stated would call and find out his financial responsibility.   I spoke with him yesterday and gave him the number to athena.  I called him and LMVM re: this again.  Faxed to [phone] [facility]. Pt is scheduled for LP tomorrow.

## 2015-01-11 ENCOUNTER — Ambulatory Visit
Admission: RE | Admit: 2015-01-11 | Discharge: 2015-01-11 | Disposition: A | Discharge status: Home / Self Care | Payer: Medicare PPO | Source: Ambulatory Visit | Attending: Neurology | Admitting: Neurology

## 2015-01-11 ENCOUNTER — Telehealth: Payer: Self-pay

## 2015-01-11 ENCOUNTER — Other Ambulatory Visit: Payer: Self-pay | Admitting: Neurology

## 2015-01-11 ENCOUNTER — Other Ambulatory Visit: Payer: Self-pay

## 2015-01-11 DIAGNOSIS — F419 Anxiety disorder, unspecified: Secondary | ICD-10-CM | POA: Diagnosis not present

## 2015-01-11 DIAGNOSIS — F068 Other specified mental disorders due to known physiological condition: Secondary | ICD-10-CM | POA: Diagnosis not present

## 2015-01-11 DIAGNOSIS — F22 Delusional disorders: Secondary | ICD-10-CM | POA: Diagnosis not present

## 2015-01-11 DIAGNOSIS — F09 Unspecified mental disorder due to known physiological condition: Secondary | ICD-10-CM

## 2015-01-11 DIAGNOSIS — F05 Delirium due to known physiological condition: Secondary | ICD-10-CM | POA: Diagnosis not present

## 2015-01-11 DIAGNOSIS — F99 Mental disorder, not otherwise specified: Secondary | ICD-10-CM | POA: Diagnosis not present

## 2015-01-11 DIAGNOSIS — F42 Obsessive-compulsive disorder: Secondary | ICD-10-CM | POA: Diagnosis not present

## 2015-01-11 LAB — CSF CELL COUNT WITH DIFFERENTIAL
RBC Count, CSF: 0 cu mm
Tube #: 3
WBC, CSF: 0 cu mm (ref 0–5)

## 2015-01-11 LAB — PROTEIN, CSF: Total Protein, CSF: 25 mg/dL (ref 15–45)

## 2015-01-11 LAB — GLUCOSE, CSF: Glucose, CSF: 56 mg/dL (ref 43–76)

## 2015-01-11 MED ORDER — DIAZEPAM 2 MG PO TABS: 2.0000 mg | ORAL_TABLET | Freq: Two times a day (BID) | ORAL | Status: DC

## 2015-01-11 NOTE — Discharge Instructions (Signed)

## 2015-01-11 NOTE — Telephone Encounter (Signed)
Diane Richardson from [facility] called and said that the pt and her husband are in the office for the lumbar puncture but they are thinking of forgoing the procedure because they don't think that the pt can stand the 24 bedrest following the procedure. They are asking if Dr. Vickey Richardson will give her something to relax her to help with the 24 hour bedrest. The radiologist performing the procedure will not prescribe anything. Spoke to Dr. Vickey Richardson and received orders for Valium 2 mg tablets, not to exceed 2 tablets per day, for 10 tablets. Relayed information to [place] and pt and husband. They were then concerned that the pt already takes ativan 2mg  daily. I relayed this information to Dr. Vickey Richardson and she said that the ativan and valium were ok, but that the pt just needed to drink lots of fluid after the procedure. I relayed this information to [place], pt, and husband. They are agreeable. Will fax the RX for valium [facility] on [address]

## 2015-01-11 NOTE — Progress Notes (Signed)
3 lavender tubes of whole blood drawn as well as an SST tube from right AC. Site is unremarkable and pt tolerated procedure well.

## 2015-01-11 NOTE — Telephone Encounter (Signed)
Baxter HireKristen , RN spoke with pts husband this am.

## 2015-01-11 NOTE — Telephone Encounter (Signed)
Returned Mr. Ladora DanielFinn's call. Gave him the ICD 10 code for dementia (F03.90). He is requesting a Rx, for something to help pt stay in bed for the 24 hour bedrest following LP. Mr. Irving CopasFinn states she is already taking ativan but she has a lot of muscle shaking.

## 2015-01-11 NOTE — Telephone Encounter (Signed)
Kristen RN called and spoke to pts husband this am.

## 2015-01-11 NOTE — Telephone Encounter (Signed)
[facility] this am.  She stated that the pt did not want to sign the ABN forms.  She stated the the [employer] is primary and [employer] is secondary.  I reiterated that pt would be responsible financially for anything that the insurance did not pay for.  She just wanted us to know.

## 2015-01-12 ENCOUNTER — Telehealth: Payer: Self-pay

## 2015-01-12 NOTE — Telephone Encounter (Signed)
Patients husband called and would like to speak with you, no there information was given. Please call and advise.

## 2015-01-12 NOTE — Telephone Encounter (Signed)
Dear Sonny Mastersandace,  I wanted you to be in the loop with this patient.  The tau protein load is important in differential dementia diagnosis and a as she is progressing , I have ordered additional Harlene SaltsJakob- Creutzfeld  Protein. I will share this with Cheral BayJerry Plovsky as well.  Have a good weekend, Enterprise ProductsCarmen Ashira Kirsten.

## 2015-01-12 NOTE — Telephone Encounter (Signed)
Pt's husband calling to see if we had gotten results back from the LP yesterday, which we have not, and I informed him of this. He wants to know if Dr. Vickey Hugerohmeier can see pt next week because the pt is having a hard time getting out of bed now and seems to be deteriorating rapidly. I told him I would ask Dr. Vickey Hugerohmeier and get back to him if not today, then early next week.

## 2015-01-14 LAB — CSF CULTURE W GRAM STAIN
Gram Stain: NONE SEEN
Gram Stain: NONE SEEN
Organism ID, Bacteria: NO GROWTH

## 2015-01-15 ENCOUNTER — Telehealth: Payer: Self-pay | Admitting: Neurology

## 2015-01-15 NOTE — Telephone Encounter (Signed)
Scott from [facility] called and stated he needed to speak with you regarding a test that was ordered on the patient. Please call and advise.

## 2015-01-15 NOTE — Telephone Encounter (Signed)
I called and spoke to Diane Richardson, he needed national prion form filled out for this test for Crutchfield/Jacob test.  I was not sure of this lab.  He will have other [facility] person contact us.

## 2015-01-15 NOTE — Telephone Encounter (Signed)
Called Diane Richardson, husband back, he is concerned because she is having headaches when she gets out of bed and wants to know if she needs to go to ER. Spoke to Dr. Vickey Hugerohmeier and she said this is normal but if the headaches don't go away in a few days then we might consider a blood patch. Informed husband. Also, Dr Vickey Hugerohmeier said it was ok to bring pt in for an appt on Wednesday at 2:30. Husband aware, appt made.

## 2015-01-15 NOTE — Telephone Encounter (Signed)
Kathlene NovemberMike, pt's husband called wanting to speak with Belenda CruiseKristin regarding pt's condition and also a package that was dropped off for the patient. He did not want to go into details with me. Please call and advice # [phone]

## 2015-01-15 NOTE — Progress Notes (Signed)
Mr. Irving CopasFinn called to report his wife having intermittent positional headaches starting 01/13/15 after her LP here 01/11/15.  She doesn't always get them when up and about and they seem to take three-four hours to occur.  They are relieved by lying down.  Patient has not repeated the 24 hour bedrest as instructed to do post-LP if this positional headache occurred.  Mr. Irving CopasFinn stated his wife has a hard time with bedrest due to her muscles.  I explained the epidural blood patch procedure, along with it's 24 hour bedrest afterwards, too.  I encouraged Mr. Irving CopasFinn to call Dr. Juluis Mireohmeir's office and explain his wife's symptoms and to seek guidance from them.  We would need an order from her to do the blood patch, anyway, though she may prefer [name] try another day of bedrest first since her headaches are intermittent.  Donell SievertJeanne Joetta Delprado, RN

## 2015-01-16 ENCOUNTER — Emergency Department (HOSPITAL_COMMUNITY)
Admission: EM | Admit: 2015-01-16 | Discharge: 2015-01-17 | Disposition: A | Discharge status: Home / Self Care | Payer: Medicare PPO | Attending: Emergency Medicine | Admitting: Emergency Medicine

## 2015-01-16 ENCOUNTER — Encounter (HOSPITAL_COMMUNITY): Payer: Self-pay | Admitting: *Deleted

## 2015-01-16 DIAGNOSIS — Z86718 Personal history of other venous thrombosis and embolism: Secondary | ICD-10-CM | POA: Diagnosis not present

## 2015-01-16 DIAGNOSIS — F329 Major depressive disorder, single episode, unspecified: Secondary | ICD-10-CM | POA: Diagnosis not present

## 2015-01-16 DIAGNOSIS — Z86711 Personal history of pulmonary embolism: Secondary | ICD-10-CM | POA: Diagnosis not present

## 2015-01-16 DIAGNOSIS — R1310 Dysphagia, unspecified: Secondary | ICD-10-CM | POA: Diagnosis not present

## 2015-01-16 DIAGNOSIS — R531 Weakness: Secondary | ICD-10-CM | POA: Insufficient documentation

## 2015-01-16 DIAGNOSIS — Z7982 Long term (current) use of aspirin: Secondary | ICD-10-CM | POA: Diagnosis not present

## 2015-01-16 DIAGNOSIS — Z79899 Other long term (current) drug therapy: Secondary | ICD-10-CM | POA: Diagnosis not present

## 2015-01-16 DIAGNOSIS — Z880 Allergy status to penicillin: Secondary | ICD-10-CM | POA: Insufficient documentation

## 2015-01-16 DIAGNOSIS — R251 Tremor, unspecified: Secondary | ICD-10-CM | POA: Diagnosis not present

## 2015-01-16 DIAGNOSIS — F419 Anxiety disorder, unspecified: Secondary | ICD-10-CM | POA: Insufficient documentation

## 2015-01-16 DIAGNOSIS — M797 Fibromyalgia: Secondary | ICD-10-CM | POA: Diagnosis not present

## 2015-01-16 DIAGNOSIS — R51 Headache: Secondary | ICD-10-CM | POA: Diagnosis present

## 2015-01-16 LAB — OLIGOCLONAL BANDS, CSF + SERM

## 2015-01-16 LAB — URINALYSIS, ROUTINE W REFLEX MICROSCOPIC
Bilirubin Urine: NEGATIVE
Glucose, UA: NEGATIVE mg/dL
Hgb urine dipstick: NEGATIVE
Ketones, ur: 15 mg/dL — AB
Leukocytes, UA: NEGATIVE
Nitrite: NEGATIVE
Protein, ur: NEGATIVE mg/dL
Specific Gravity, Urine: 1.02 (ref 1.005–1.030)
Urobilinogen, UA: 0.2 mg/dL (ref 0.0–1.0)
pH: 5 (ref 5.0–8.0)

## 2015-01-16 LAB — COMPREHENSIVE METABOLIC PANEL
ALBUMIN: 4.4 g/dL (ref 3.5–5.0)
ALT: 34 U/L (ref 14–54)
AST: 30 U/L (ref 15–41)
Alkaline Phosphatase: 47 U/L (ref 38–126)
Anion gap: 13 (ref 5–15)
BUN: 20 mg/dL (ref 6–20)
CO2: 24 mmol/L (ref 22–32)
Calcium: 10.3 mg/dL (ref 8.9–10.3)
Chloride: 102 mmol/L (ref 101–111)
Creatinine, Ser: 0.87 mg/dL (ref 0.44–1.00)
GFR calc Af Amer: >60 mL/min (ref >60)
GFR calc non Af Amer: >60 mL/min (ref >60)
Glucose, Bld: 103 mg/dL — ABNORMAL HIGH (ref 70–99)
Potassium: 4.2 mmol/L (ref 3.5–5.1)
SODIUM: 139 mmol/L (ref 135–145)
TOTAL PROTEIN: 7.2 g/dL (ref 6.5–8.1)
Total Bilirubin: 0.6 mg/dL (ref 0.3–1.2)

## 2015-01-16 LAB — CBC WITH DIFFERENTIAL/PLATELET
Basophils Absolute: 0 10*3/uL (ref 0.0–0.1)
Basophils Relative: 0 % (ref 0–1)
EOS PCT: 0 % (ref 0–5)
Eosinophils Absolute: 0 10*3/uL (ref 0.0–0.7)
HEMATOCRIT: 42.6 % (ref 36.0–46.0)
HEMOGLOBIN: 14.4 g/dL (ref 12.0–15.0)
LYMPHS ABS: 1.2 10*3/uL (ref 0.7–4.0)
Lymphocytes Relative: 17 % (ref 12–46)
MCH: 30.4 pg (ref 26.0–34.0)
MCHC: 33.8 g/dL (ref 30.0–36.0)
MCV: 90.1 fL (ref 78.0–100.0)
MONO ABS: 0.5 10*3/uL (ref 0.1–1.0)
Monocytes Relative: 7 % (ref 3–12)
Neutro Abs: 5.6 10*3/uL (ref 1.7–7.7)
Neutrophils Relative %: 76 % (ref 43–77)
Platelets: 269 10*3/uL (ref 150–400)
RBC: 4.73 MIL/uL (ref 3.87–5.11)
RDW: 12.1 % (ref 11.5–15.5)
WBC: 7.4 10*3/uL (ref 4.0–10.5)

## 2015-01-16 MED ORDER — SODIUM CHLORIDE 0.9 % IV BOLUS (SEPSIS)
1000.0000 mL | Freq: Once | INTRAVENOUS | Status: AC
  Administered 2015-01-16: 1000 mL via INTRAVENOUS

## 2015-01-16 NOTE — Discharge Instructions (Signed)
Follow-up with your physician as scheduled tomorrow.  Continue your medications at their current dosages, with the exception of Benadryl.

## 2015-01-16 NOTE — ED Provider Notes (Signed)
CSN: [id number]     Arrival date & time 01/16/15  1902 History   First MD Initiated Contact with Patient 01/16/15 2100     Chief Complaint  Patient presents with  . Headache     (Consider location/radiation/quality/duration/timing/severity/associated sxs/prior Treatment) HPI  Pt is a 64yo female with hx of fibromyalgia, PTSD, anxiety, depression, DVT, PE, ADD, muscle spasms and chronic fatigue, brought to ED by husband and daughter with concerns pt has had intermittent frontal headaches since having an LP on Thursday, 01/11/15 as prescribed by her neurologist for "specific labs" husband states pt has f/u appointment tomorrow to go over lab results.  Pt also has appointment tomorrow with psychiatry as pt's psychiatrist is working on lowering pt's Abilify dose.  Pt denies HA at this time but states they are always in the front of her head and improve when she lies down and drinks something.  Today, caretaker became concerned pt was unable to use the bathroom and also concerned pt appeared to choke on her ibuprofen given for the headache earlier.  Pt vomited after getting pill stuck in her throat.  Pt has also had decreased PO intake. Husband states he believes pt has increased saliva secretions as well as diffuse tremor with muscle spasms from her medications. No fever or vomiting. Pt denies urinary symptoms.   Past Medical History  Diagnosis Date  . Fibromyalgia   . PTSD (post-traumatic stress disorder)   . Anxiety   . Depression   . DVT (deep venous thrombosis)   . PE (pulmonary embolism)   . ADD (attention deficit disorder)   . Muscle spasm     bad  . Fatigue     chronic   Past Surgical History  Procedure Laterality Date  . Childbirth  1989    x1,NVD  . Pulmonary embolism surgery  1983    x9 days  . Foot surgery Right     pt states she had bunion surgery right foot.   Family History  Problem Relation Age of Onset  . CAD Father   . Hyperlipidemia Father   . Hypertension Father    . Hyperlipidemia Brother   . Diabetes Maternal Grandmother   . Cancer Maternal Grandmother     stomach  . Dementia Maternal Grandfather   . Colon polyps Maternal Grandfather   . CAD Paternal Grandmother   . Diabetes Paternal Grandmother   . Dementia Paternal Grandfather    History  Substance Use Topics  . Smoking status: Never Smoker   . Smokeless tobacco: Never Used  . Alcohol Use: Yes     Comment: 3 glasses of wine weekly   OB History    No data available     Review of Systems  Constitutional: Positive for chills, appetite change and fatigue. Negative for fever.  Respiratory: Negative for cough and shortness of breath.   Cardiovascular: Negative for chest pain, palpitations and leg swelling.  Gastrointestinal: Negative for nausea, vomiting, abdominal pain and diarrhea.  Neurological: Positive for tremors, weakness and headaches ( frontal). Negative for dizziness, seizures, syncope, light-headedness and numbness.  All other systems reviewed and are negative.     Allergies  Codeine; Penicillins; Percocet; Percodan; and Donnatal  Home Medications   Prior to Admission medications   Medication Sig Start Date End Date Taking? Authorizing Provider  ARIPiprazole (ABILIFY) 15 MG tablet Take 15 mg by mouth daily.   Yes Historical Provider, MD  aspirin EC 81 MG tablet Take 1 tablet (81 mg total) by mouth  daily. 10/12/14  Yes Adonis Brook, NP  Cholecalciferol (VITAMIN D3) 3000 UNITS TABS Take 3,000 Units by mouth daily. For nutrition supplementation 10/12/14  Yes Adonis Brook, NP  Coenzyme Q10 (COQ-10) 50 MG CAPS Take 1 capsule by mouth daily. One capsule with a meal for supplementation 10/12/14  Yes Adonis Brook, NP  divalproex (DEPAKOTE SPRINKLE) 125 MG capsule Take 125 mg by mouth 2 (two) times daily.   Yes Historical Provider, MD  LORazepam (ATIVAN) 0.5 MG tablet Take 1 tablet (0.5 mg total) by mouth 2 (two) times daily. Patient taking differently: Take 2 mg by mouth See  admin instructions.  10/12/14  Yes Adonis Brook, NP  traZODone (DESYREL) 50 MG tablet Take 50 mg by mouth at bedtime.   Yes Historical Provider, MD  ARIPiprazole (ABILIFY) 30 MG tablet Take 1 tablet (30 mg total) by mouth daily. Patient not taking: Reported on 01/16/2015 10/12/14   Adonis Brook, NP  diazepam (VALIUM) 2 MG tablet Take 1 tablet (2 mg total) by mouth 2 (two) times daily. NOT TO EXCEED 2 TABLETS IN ONE DAY. Patient not taking: Reported on 01/16/2015 01/11/15   Melvyn Novas, MD  divalproex (DEPAKOTE ER) 500 MG 24 hr tablet Take 1 tablet (500 mg total) by mouth at bedtime. Patient not taking: Reported on 01/16/2015 10/12/14   Adonis Brook, NP   BP 164/57 mmHg  Pulse 94  Temp(Src) 98 F (36.7 C) (Oral)  Resp 18  SpO2 98%  LMP  (LMP Unknown) Physical Exam  Constitutional: She appears well-developed and well-nourished. No distress.  HENT:  Head: Normocephalic and atraumatic.  Eyes: Conjunctivae and EOM are normal. Pupils are equal, round, and reactive to light. No scleral icterus.  Neck: Normal range of motion. Neck supple.  Cardiovascular: Normal rate, regular rhythm and normal heart sounds.   Pulmonary/Chest: Effort normal and breath sounds normal. No respiratory distress. She has no wheezes. She has no rales. She exhibits no tenderness.  Abdominal: Soft. Bowel sounds are normal. She exhibits no distension and no mass. There is no tenderness. There is no rebound and no guarding.  Musculoskeletal: Normal range of motion.  Neurological: She is alert. She displays tremor ( essential). GCS eye subscore is 4. GCS verbal subscore is 5. GCS motor subscore is 6.  Alert to person, place, and time. CN II-XII in grossly in tact. Diffuse tremor. Slowed speech. Slowed movements. 5/5 grip strength bilaterally. 4/5 strength in lower extremities bilaterally.  Pt had difficulty pulling herself in exam bed for Lung exam. Pt appears too weak to ambulate unassisted. Ambulation not attempted during  initial exam.  Skin: Skin is warm and dry. She is not diaphoretic.  Nursing note and vitals reviewed.   ED Course  Procedures (including critical care time) Labs Review Labs Reviewed  COMPREHENSIVE METABOLIC PANEL - Abnormal; Notable for the following:    Glucose, Bld 103 (*)    All other components within normal limits  URINALYSIS, ROUTINE W REFLEX MICROSCOPIC - Abnormal; Notable for the following:    APPearance HAZY (*)    Ketones, ur 15 (*)    All other components within normal limits  CBC WITH DIFFERENTIAL/PLATELET    Imaging Review No results found.   EKG Interpretation None      MDM   Final diagnoses:  Weakness  Dysphagia    Pt is a 64yo female presenting to ED with husband and daughter with reports of intermittent frontal head headache, as well as muscle spasms and tremor. Muscle spasms thought to have  caused pt to get ibuprofen stuck in her throat earlier today, causing pt to vomit.   Pt's symptoms have been ongoing for several weeks. Per husband, pt had LP last week for neurology and currently working with psychiatry to decrease dose of Abilify. Pt has f/u appointment tomorrow with neuro and psychiatry. No recent head trauma. No fever. No cough or congestion. No HA in ED. Labs: unremarkable. Discussed pt with Dr. Fayrene FearingJames who also examined pt.  Per husband, pt takes trazadone and benadryl at home to help with sleep. Dr. Fayrene FearingJames recommends pt discontinuing benadryl for now and to follow up as scheduled tomorrow.  No evidence of emergent or infectious process taking place at this time. Return precautions provided. Pt verbalized understanding and agreement with tx plan.    Junius Finnerrin O'Malley, PA-C 01/17/15 0004  Rolland PorterMark James, MD 01/22/15 [phone]

## 2015-01-16 NOTE — ED Provider Notes (Signed)
Patient seen and evaluated. Discussed with Diane FinnerErin O'Malley PA. She examined. Slow methodical speech. She is awake and alert and oriented. Her husband does supplement and offer her a great deal of her history. Progressive weakness and difficulty with dysphagia over several months. Family can trace her episode back to November January where she had "psychiatric episodes". Placed on Abilify and had great response. However has developed progressive weakness and difficulty with swallowing and dry mouth. Her physicians including psychiatry and neurologist are tapering her Abilify. Sounds like her symptoms have been progressive, however today she was trying to swallow a Advil liquid gel when it became lodged in her throat. She coughed and eventually regurgitated it. She still feels like she has a little burning in the back of her throat. She has drank since that time. Has been taking Benadryl at night for the last one or 2 weeks. It is suggested that she stop this. She did not take her dose last night. She continues to complain of a dry mouth.  His head CT and MRI. Underwent lumbar puncture 4 days ago. Her cell count, glucose, protein, and oligoclonal bands were negative. Jacob-Crutchfield pending.  Labs are unrevealing. Her exam does not show some drooling or stridor or difficulty controlling her airway. I do not think she requires admission. She was given some IV fluids. She has an appointment tomorrow with her psychiatrist and has been monitoring and taper medications, as well as follow-up with her neurologist to discuss any further testing.  She is appropriate for outpatient treatment at this time.  Diane PorterMark Lamaria Hildebrandt, MD 01/17/15 [phone]

## 2015-01-16 NOTE — ED Notes (Signed)
Pt in with husband reporting intermittent headache since a LP last Thursday, history of same, also reports decreased PO intake, today she tried to take ibuprofen and it got caught in her throat which made her vomit, caregiver concerned due to vomiting episode today and decreased PO intake recently, no distress noted

## 2015-01-16 NOTE — ED Notes (Signed)
Pt reports feeling highly anxious. Pt reports normally taking 0.5 mg Ativan 4x daily, normally takes trazadone 50mg , depakote 125mg  and ativan 0.5mg  at 8pm.  Pt family reports pt takes 1.0mg  later in the evening.

## 2015-01-17 ENCOUNTER — Encounter: Payer: Self-pay | Admitting: Neurology

## 2015-01-17 ENCOUNTER — Ambulatory Visit (INDEPENDENT_AMBULATORY_CARE_PROVIDER_SITE_OTHER): Payer: Medicare PPO | Admitting: Neurology

## 2015-01-17 ENCOUNTER — Ambulatory Visit: Payer: Self-pay | Admitting: Neurology

## 2015-01-17 VITALS — BP 104/70 | HR 88 | Temp 97.7°F | Resp 14 | Ht 61.0 in | Wt 120.0 lb

## 2015-01-17 DIAGNOSIS — F0390 Unspecified dementia without behavioral disturbance: Secondary | ICD-10-CM

## 2015-01-17 DIAGNOSIS — F28 Other psychotic disorder not due to a substance or known physiological condition: Secondary | ICD-10-CM | POA: Insufficient documentation

## 2015-01-17 DIAGNOSIS — F068 Other specified mental disorders due to known physiological condition: Secondary | ICD-10-CM | POA: Diagnosis not present

## 2015-01-17 DIAGNOSIS — R6889 Other general symptoms and signs: Secondary | ICD-10-CM

## 2015-01-17 DIAGNOSIS — G253 Myoclonus: Secondary | ICD-10-CM | POA: Diagnosis not present

## 2015-01-17 NOTE — Patient Instructions (Signed)
Prion Diseases Prion diseases cause decline in parts of the brain. They get worse over time after symptoms appear. There are 5 human prion diseases currently recognized:  Kuru.  Creutzfeldt-Jakob disease (CJD).  Variant Creutzfeldt-Jacob disease (vCJD).  Gerstmann-Strussler-Scheinker syndrome (GSS).  Fatal familial insomnia (FFI). When seen under a microscope, these diseases show similar changes in the appearance of the brain. This includes the presence of small holes (vacuoles) which appear spongelike. The former term for these diseases was spongiform encephalopathies. CAUSES  Prion proteins occur in 2 forms, which are nearly identical. The normal form is a harmless protein found in cells throughout the human body. The abnormal form is oddly folded or twisted. It causes disease when it builds up in brain cells and kills these cells. Human prion diseases occur in 3 ways:  Passed down from parent to child (hereditary).  Passed (transmitted) from infected persons or animals.  For unknown reasons, sometimes a person's normal prions change into abnormal prions (sporadic). Kuru was found among people in a tribe in [place]. Members of the tribe had a ritual in which they ate the brains of members who died (ritual cannibalism). When this ritual was stopped, kuru largely disappeared. CJD is the most frequent human prion disease, but it is still rare. Most cases of CJD are sporadic. The rest mostly occur in families. Less than 1% of CJD cases are caused by rare problems in certain medical exams or treatment (iatrogenic CJD, iCJD). vCJD (once called Mad Cow Disease of Humans) was first reported in [address]1995 in the [place]. Evidence supports that vCJD followed an epidemic of a prion disease in cattle in the [place]. The human vCJD resulted from cattle-to-human transmission of a prion disease. GSS and FFI are very rare inherited diseases. Each year, they occur in only 1 to 10 people per 100 million. SYMPTOMS   Symptoms of the different prion diseases vary but commonly include:  Memory disorders, personality changes, and impaired reasoning (dementia).  Involuntary jerking movements (myoclonus).  Inability to walk steadily (ataxia). Symptoms often begin in a patient's teens to early 3350s. This is an earlier age than the age at which diseases with similar symptoms (Alzheimer or Parkinson disease) begin. DIAGNOSIS  Diagnosis may be suggested by a careful history, including history of family members with early age dementia. Specific diagnosis may require tests such as:  Spinal tap (lumbar puncture) to study cerebrospinal fluid.  Electroencephalography (EEG) to record brain waves.  Computerized magnetic scan (MRI) of the brain.  Brain tissue sample (biopsy). TREATMENT There is no known cure or effective treatment for any of the prion diseases. They all get worse as the disease progresses. These diseases are life threatening, usually within 1 year of the first symptoms. Some symptoms may respond to medicines prescribed by your caregiver. HOME CARE INSTRUCTIONS   If medicine is prescribed, take it as directed by your caregiver and as described on the prescription bottle.  Seek supportive treatment to improve comfort when needed or as guided by your caregiver. Document Released: 08/15/2002 Document Revised: 01/09/2014 Document Reviewed: 01/10/2010 [facility] Patient Information 2015 [place] [place]. This information is not intended to replace advice given to you by your health care provider. Make sure you discuss any questions you have with your health care provider.

## 2015-01-17 NOTE — Progress Notes (Signed)
Provider:  Larey Richardson, M D  Referring Provider: Carol Ada, MD Primary Richardson Physician:  Diane Naas, MD  Chief Complaint  Patient presents with  . Dementia    results from spinal tap, rm11, husband    HPI:  Diane Richardson is a 64 y.o. female  Is seen here as a  revisit after a recent lumbar puncture. Diane Richardson had been followed on an off in this office for about 3 years, it was after about a 12 month hiatus that her psychiatrist, Dr. Carolann Richardson, aphthous to see her again because of her rapid progressing decline. She now receives her her psychiatric Richardson to Dr. Toy Richardson, and I have a note here from Diane Richardson, a licensed clinical Education officer, museum. Diane Richardson has an abnormal EEG, and a decline in cognitive function throughout her last visits.  She also was restless she could not sit still she was to achieve trembling and she seemed to have no fasciculations and occasional myoclonic jerks. Her personality changed all this was for a brief period of time explained with a possible concussion or contusion but truly an MRI did not show any significant brain injury. There were cognitive changes and behavior changes over the last 2 years but there her behavior change rapidly over the last 5-6 months.  Her husband feels that she is cognitively more alert and oriented again but she has poverty of speech and she is now in a state of more rigidity and less spontaneous movement than ever before. Based on clinical observation and the observation of my colleagues who performed a lumbar spinal tap on 01-11-15 unfortunately the dark "low proteins and type proteins are still pending at this time, but she had normal glucose, normal protein and normal cell count for her CSF.  This again would be an expected finding in a patient- we do not think has any bacterial or viral infectious origin for her change in behavior.  Diane Richardson is also noted on Monday, May 9 that the patient was extremely psychomotor  slowed, not able to dress herself not able to stand for any length of time and that her voice had become more dysphonic and weak. She had severe muscle tremors had trouble this does 5 year and she reported a rigidity and muscle contraction level associated with a low-grade fever. She was described as jumpy and felt that her muscles even hurt. The involuntary muscle movements affected hands and arms more than the lower extremities. She had difficulty swallowing and was therefore also not eating or chewing. She continues to fall. The speech was difficult to understand and socialworker Richardson stated that the patient was drooling.   She needs help with all ADLs.    Review of Systems: Out of a complete 14 system review, the patient complains of only the following symptoms, and all other reviewed systems are negative. high fall risk, myoclonus.  She is rigid and turns  Enbloc, needs 6 steps. No facial expression.   History   Social History  . Marital Status: Married    Spouse Name: N/A  . Number of Children: 1  . Years of Education: PHD   Occupational History  . disabled     FORMER TEACHER   Social History Main Topics  . Smoking status: Never Smoker   . Smokeless tobacco: Never Used  . Alcohol Use: Yes     Comment: 3 glasses of wine weekly  . Drug Use: No  . Sexual Activity: Yes  Other Topics Concern  . Not on file   Social History Narrative   Drinks less than one cup of caffeine daily.    Family History  Problem Relation Age of Onset  . CAD Father   . Hyperlipidemia Father   . Hypertension Father   . Hyperlipidemia Brother   . Diabetes Maternal Grandmother   . Cancer Maternal Grandmother     stomach  . Dementia Maternal Grandfather   . Colon polyps Maternal Grandfather   . CAD Paternal Grandmother   . Diabetes Paternal Grandmother   . Dementia Paternal Grandfather     Past Medical History  Diagnosis Date  . Fibromyalgia   . PTSD (post-traumatic stress disorder)     . Anxiety   . Depression   . DVT (deep venous thrombosis)   . PE (pulmonary embolism)   . ADD (attention deficit disorder)   . Muscle spasm     bad  . Fatigue     chronic    Past Surgical History  Procedure Laterality Date  . Childbirth  1989    x1,NVD  . Pulmonary embolism surgery  1983    x9 days  . Foot surgery Right     pt states she had bunion surgery right foot.    Current Outpatient Prescriptions  Medication Sig Dispense Refill  . ARIPiprazole (ABILIFY) 15 MG tablet Take 15 mg by mouth daily.    . ARIPiprazole (ABILIFY) 30 MG tablet Take 1 tablet (30 mg total) by mouth daily. 30 tablet 0  . aspirin EC 81 MG tablet Take 1 tablet (81 mg total) by mouth daily.    . Cholecalciferol (VITAMIN D3) 3000 UNITS TABS Take 3,000 Units by mouth daily. For nutrition supplementation    . Coenzyme Q10 (COQ-10) 50 MG CAPS Take 1 capsule by mouth daily. One capsule with a meal for supplementation  0  . diazepam (VALIUM) 2 MG tablet Take 1 tablet (2 mg total) by mouth 2 (two) times daily. NOT TO EXCEED 2 TABLETS IN ONE DAY. 10 tablet 0  . divalproex (DEPAKOTE ER) 500 MG 24 hr tablet Take 1 tablet (500 mg total) by mouth at bedtime. 30 tablet 0  . divalproex (DEPAKOTE SPRINKLE) 125 MG capsule Take 125 mg by mouth 2 (two) times daily.    Marland Kitchen LORazepam (ATIVAN) 0.5 MG tablet Take 1 tablet (0.5 mg total) by mouth 2 (two) times daily. (Patient taking differently: Take 2 mg by mouth See admin instructions. ) 30 tablet 0  . traZODone (DESYREL) 50 MG tablet Take 50 mg by mouth at bedtime.     No current facility-administered medications for this visit.    Allergies as of 01/17/2015 - Review Complete 01/17/2015  Allergen Reaction Noted  . Codeine Hives and Diarrhea 02/03/2013  . Penicillins Hives and Diarrhea 01/23/2013  . Percocet [oxycodone-acetaminophen] Other (See Comments) 02/03/2013  . Percodan [oxycodone-aspirin] Other (See Comments) 02/03/2013  . Donnatal [belladonna alk-phenobarb er]  Rash 02/03/2013    Vitals: BP 104/70 mmHg  Pulse 88  Temp(Src) 97.7 F (36.5 C)  Resp 14  Ht 5' 1"  (1.549 m)  Wt 120 lb (54.432 kg)  BMI 22.69 kg/m2  LMP  (LMP Unknown) Last Weight:  Wt Readings from Last 1 Encounters:  01/17/15 120 lb (54.432 kg)   Last Height:   Ht Readings from Last 1 Encounters:  01/17/15 5' 1"  (1.549 m)    Physical exam:  General: The patient is awake, alert and appears not in acute distress. The patient is  well groomed. Head: Normocephalic, atraumatic. Neck is rigid.  Cardiovascular:  Regular rate and rhythm , without  murmurs or carotid bruit, and without distended neck veins. Respiratory: Lungs are clear to auscultation. Skin:  Without evidence of edema, or rash  Neurologic exam : The patient is awake and alert, oriented to place and time.  Memory deferred - There is a high level of anxiety, but normal concentration ability. Speech is non  fluent withdysarthria, dysphonia and poverty of speech  aphasia. Mood and affect are depressed, there is no facial expression . Cranial nerves: Pupils are equal and briskly reactive to light.  Extraocular movements  in vertical  planes intact and  She had 3 distinct  saccadic points on horizontal eyemovemtns. without nystagmus.  Visual fields by finger perimetry are intact. Hearing to finger rub intact.   Facial sensation intact to fine touch. Facial motor strength there is no expression, she has titubation.   Motor exam:  Elevated  tone , waxy resistance. normalmuscle bulk and symmetric strength . I see today and no tremor in her hand or fingers but titubation and a jaw tremor. She is rather rigid in her posture. Sensory:  Fine touch, pinprick and vibration were tested in all extremities. Proprioception was normal.  Coordination: Rapid alternating movements in the fingers/hands were deferred.  Gait and station: Patient walks with assistance,  he needed help to rise from the chair, she also needs assistance not to  fall when turning. She walks very rigidly en bloc does not rotate at the hip or lumbar spine or at the shoulder. Turning requires between 5 and 7 steps. There is a tendency to retropulse -a s backward pull in her movements. Deep tendon reflexes: in the  upper and lower extremities are symmetric and intact. Babinski maneuver response is  downgoing.   Assessment:  After physical and neurologic examination, review of laboratory studies, imaging, neurophysiology testing and pre-existing records, assessment is that of :  Increase rigor, myoclonic jerks, paroxysmal tremors, loss of facial expression. But all this could be seen in catatonia and in a psychiatric or psychotic breakdown the patient has been able to follow conversations and is actually remarkably well oriented to time and place. However she is at this time at home not able to perform activities of daily living such as dressing, preparing a meal, just walking to the bathroom. There is some cognitive decline as well but she is not considered severely demented. My concern was in the setting of the patient's clinical symptoms that she may suffer from a prior disorder, we also sent a toe protein and a Tonye Pearson "felt test from this cerebral spinal fluid which was otherwise unremarkable.  Both of these tests do usually take 10-15 business days I'm still waiting for the results at this time. In a previous MOCA  test from 10-16-14 I noted that the patient actually did very well on a Montral cognitive assessment test in June 2014,  I had felt she suffered from memory loss and or exciting and depression. The repeat Mini-Mental test actually was excellent and would not even have placed her in the dementia category. Now the question of paranoia or anxiety interfering with the patient's ability to perform on tests or an activities of daily living is also in the differential.  The patient will be followed by Dr. 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 There  was no evidence of concussion, contusion to more bleed or MS. I'm waiting for the return of the protein tests we sent off but to help Mrs. Jezek in her activities of daily living I wonder if she should go for while to a Richardson facility or if this is an option. I will be happy to order physical therapy at home for her her husband is able to drive her and she could go to next door. We agreed that we will meet at home Richardson for this patient would be considered homebound.       Plan:  Treatment plan and additional workup : depending on labs. For now home health Richardson and PT>      Diane Seat MD 01/17/2015

## 2015-01-22 ENCOUNTER — Telehealth: Payer: Self-pay

## 2015-01-22 DIAGNOSIS — F332 Major depressive disorder, recurrent severe without psychotic features: Secondary | ICD-10-CM | POA: Diagnosis not present

## 2015-01-22 NOTE — Telephone Encounter (Signed)
Faxed [facility] results of tau protein to Dr. Donell Beersplovsky and Dr. Merri Brunetteandace smith (PCP) per DR. Dohmeier request.

## 2015-01-25 ENCOUNTER — Telehealth: Payer: Self-pay

## 2015-01-25 NOTE — Telephone Encounter (Signed)
Called pt's husband to speak to him about results of LP. No answer. Left a message for him to call me back.

## 2015-01-26 NOTE — Telephone Encounter (Signed)
Spoke to pt's husband [id number]:results. Informed him that everything came back normal, even the Creuztfeld Jackobsen test. Per Dr. Vickey Hugerohmeier, pt needs to see Dr. Donell BeersPlovsky, her psychiatrist.He requested a copy of everything for his records and for the pt's brother, who is an internist, to review. Transferred pt to MR. Faxed results to pt's PCP Dr. Katrinka BlazingSmith and to Dr. Donell BeersPlovsky.

## 2015-01-29 ENCOUNTER — Telehealth: Payer: Self-pay | Admitting: Neurology

## 2015-01-29 ENCOUNTER — Telehealth: Payer: Self-pay

## 2015-01-29 NOTE — Telephone Encounter (Signed)
[facility]

## 2015-01-29 NOTE — Telephone Encounter (Signed)
Pt's husband(Mike) called wanting to know about referral for PT.  Please call and advise.  He can be reached at [phone].

## 2015-01-29 NOTE — Telephone Encounter (Signed)
Informed Dorice LamasMike Cuadras that pt's home health [facility], and to expect a phone call from them within a week. He verbalized understanding.

## 2015-01-30 DIAGNOSIS — F0390 Unspecified dementia without behavioral disturbance: Secondary | ICD-10-CM | POA: Diagnosis not present

## 2015-01-30 LAB — BETA ISOFORM (CREUTZFELDT-JAKOB DISEASE)

## 2015-02-01 DIAGNOSIS — M797 Fibromyalgia: Secondary | ICD-10-CM | POA: Diagnosis not present

## 2015-02-01 DIAGNOSIS — F419 Anxiety disorder, unspecified: Secondary | ICD-10-CM | POA: Diagnosis not present

## 2015-02-01 DIAGNOSIS — G253 Myoclonus: Secondary | ICD-10-CM | POA: Diagnosis not present

## 2015-02-01 DIAGNOSIS — F0391 Unspecified dementia with behavioral disturbance: Secondary | ICD-10-CM | POA: Diagnosis not present

## 2015-02-01 DIAGNOSIS — Z7982 Long term (current) use of aspirin: Secondary | ICD-10-CM | POA: Diagnosis not present

## 2015-02-01 DIAGNOSIS — R479 Unspecified speech disturbances: Secondary | ICD-10-CM | POA: Diagnosis not present

## 2015-02-01 DIAGNOSIS — F4310 Post-traumatic stress disorder, unspecified: Secondary | ICD-10-CM | POA: Diagnosis not present

## 2015-02-01 DIAGNOSIS — F329 Major depressive disorder, single episode, unspecified: Secondary | ICD-10-CM | POA: Diagnosis not present

## 2015-02-07 DIAGNOSIS — R479 Unspecified speech disturbances: Secondary | ICD-10-CM | POA: Diagnosis not present

## 2015-02-07 DIAGNOSIS — Z7982 Long term (current) use of aspirin: Secondary | ICD-10-CM | POA: Diagnosis not present

## 2015-02-07 DIAGNOSIS — M797 Fibromyalgia: Secondary | ICD-10-CM | POA: Diagnosis not present

## 2015-02-07 DIAGNOSIS — G253 Myoclonus: Secondary | ICD-10-CM | POA: Diagnosis not present

## 2015-02-07 DIAGNOSIS — F419 Anxiety disorder, unspecified: Secondary | ICD-10-CM | POA: Diagnosis not present

## 2015-02-07 DIAGNOSIS — F0391 Unspecified dementia with behavioral disturbance: Secondary | ICD-10-CM | POA: Diagnosis not present

## 2015-02-07 DIAGNOSIS — F329 Major depressive disorder, single episode, unspecified: Secondary | ICD-10-CM | POA: Diagnosis not present

## 2015-02-07 DIAGNOSIS — F4310 Post-traumatic stress disorder, unspecified: Secondary | ICD-10-CM | POA: Diagnosis not present

## 2015-02-08 DIAGNOSIS — F419 Anxiety disorder, unspecified: Secondary | ICD-10-CM | POA: Diagnosis not present

## 2015-02-08 DIAGNOSIS — F4310 Post-traumatic stress disorder, unspecified: Secondary | ICD-10-CM | POA: Diagnosis not present

## 2015-02-08 DIAGNOSIS — Z7982 Long term (current) use of aspirin: Secondary | ICD-10-CM | POA: Diagnosis not present

## 2015-02-08 DIAGNOSIS — R479 Unspecified speech disturbances: Secondary | ICD-10-CM | POA: Diagnosis not present

## 2015-02-08 DIAGNOSIS — F329 Major depressive disorder, single episode, unspecified: Secondary | ICD-10-CM | POA: Diagnosis not present

## 2015-02-08 DIAGNOSIS — F0391 Unspecified dementia with behavioral disturbance: Secondary | ICD-10-CM | POA: Diagnosis not present

## 2015-02-08 DIAGNOSIS — M797 Fibromyalgia: Secondary | ICD-10-CM | POA: Diagnosis not present

## 2015-02-08 DIAGNOSIS — G253 Myoclonus: Secondary | ICD-10-CM | POA: Diagnosis not present

## 2015-02-09 DIAGNOSIS — F0391 Unspecified dementia with behavioral disturbance: Secondary | ICD-10-CM | POA: Diagnosis not present

## 2015-02-09 DIAGNOSIS — Z7982 Long term (current) use of aspirin: Secondary | ICD-10-CM | POA: Diagnosis not present

## 2015-02-09 DIAGNOSIS — R479 Unspecified speech disturbances: Secondary | ICD-10-CM | POA: Diagnosis not present

## 2015-02-09 DIAGNOSIS — M797 Fibromyalgia: Secondary | ICD-10-CM | POA: Diagnosis not present

## 2015-02-09 DIAGNOSIS — F329 Major depressive disorder, single episode, unspecified: Secondary | ICD-10-CM | POA: Diagnosis not present

## 2015-02-09 DIAGNOSIS — F4310 Post-traumatic stress disorder, unspecified: Secondary | ICD-10-CM | POA: Diagnosis not present

## 2015-02-09 DIAGNOSIS — F419 Anxiety disorder, unspecified: Secondary | ICD-10-CM | POA: Diagnosis not present

## 2015-02-09 DIAGNOSIS — G253 Myoclonus: Secondary | ICD-10-CM | POA: Diagnosis not present

## 2015-02-10 DIAGNOSIS — F329 Major depressive disorder, single episode, unspecified: Secondary | ICD-10-CM | POA: Diagnosis not present

## 2015-02-10 DIAGNOSIS — Z7982 Long term (current) use of aspirin: Secondary | ICD-10-CM | POA: Diagnosis not present

## 2015-02-10 DIAGNOSIS — F4310 Post-traumatic stress disorder, unspecified: Secondary | ICD-10-CM | POA: Diagnosis not present

## 2015-02-10 DIAGNOSIS — F419 Anxiety disorder, unspecified: Secondary | ICD-10-CM | POA: Diagnosis not present

## 2015-02-10 DIAGNOSIS — R479 Unspecified speech disturbances: Secondary | ICD-10-CM | POA: Diagnosis not present

## 2015-02-10 DIAGNOSIS — M797 Fibromyalgia: Secondary | ICD-10-CM | POA: Diagnosis not present

## 2015-02-10 DIAGNOSIS — F0391 Unspecified dementia with behavioral disturbance: Secondary | ICD-10-CM | POA: Diagnosis not present

## 2015-02-10 DIAGNOSIS — G253 Myoclonus: Secondary | ICD-10-CM | POA: Diagnosis not present

## 2015-02-12 ENCOUNTER — Other Ambulatory Visit: Payer: Self-pay

## 2015-02-12 DIAGNOSIS — R251 Tremor, unspecified: Secondary | ICD-10-CM

## 2015-02-13 ENCOUNTER — Telehealth: Payer: Self-pay

## 2015-02-13 DIAGNOSIS — F4310 Post-traumatic stress disorder, unspecified: Secondary | ICD-10-CM | POA: Diagnosis not present

## 2015-02-13 DIAGNOSIS — R479 Unspecified speech disturbances: Secondary | ICD-10-CM | POA: Diagnosis not present

## 2015-02-13 DIAGNOSIS — Z7982 Long term (current) use of aspirin: Secondary | ICD-10-CM | POA: Diagnosis not present

## 2015-02-13 DIAGNOSIS — M797 Fibromyalgia: Secondary | ICD-10-CM | POA: Diagnosis not present

## 2015-02-13 DIAGNOSIS — G253 Myoclonus: Secondary | ICD-10-CM | POA: Diagnosis not present

## 2015-02-13 DIAGNOSIS — F419 Anxiety disorder, unspecified: Secondary | ICD-10-CM | POA: Diagnosis not present

## 2015-02-13 DIAGNOSIS — F0391 Unspecified dementia with behavioral disturbance: Secondary | ICD-10-CM | POA: Diagnosis not present

## 2015-02-13 DIAGNOSIS — F329 Major depressive disorder, single episode, unspecified: Secondary | ICD-10-CM | POA: Diagnosis not present

## 2015-02-13 NOTE — Telephone Encounter (Signed)
Called pt to inform pt's husband that Dr. Vickey Hugerohmeier put in for a referral for a second opinion to Dr. Arbutus Leasat at [facility]. Mr. Diane Richardson says he appreciates it and wants to look at see if [facility]. He said he would call back with a name of a neurologist.

## 2015-02-14 DIAGNOSIS — R479 Unspecified speech disturbances: Secondary | ICD-10-CM | POA: Diagnosis not present

## 2015-02-14 DIAGNOSIS — Z7982 Long term (current) use of aspirin: Secondary | ICD-10-CM | POA: Diagnosis not present

## 2015-02-14 DIAGNOSIS — M797 Fibromyalgia: Secondary | ICD-10-CM | POA: Diagnosis not present

## 2015-02-14 DIAGNOSIS — F419 Anxiety disorder, unspecified: Secondary | ICD-10-CM | POA: Diagnosis not present

## 2015-02-14 DIAGNOSIS — F4310 Post-traumatic stress disorder, unspecified: Secondary | ICD-10-CM | POA: Diagnosis not present

## 2015-02-14 DIAGNOSIS — G253 Myoclonus: Secondary | ICD-10-CM | POA: Diagnosis not present

## 2015-02-14 DIAGNOSIS — F0391 Unspecified dementia with behavioral disturbance: Secondary | ICD-10-CM | POA: Diagnosis not present

## 2015-02-14 DIAGNOSIS — F329 Major depressive disorder, single episode, unspecified: Secondary | ICD-10-CM | POA: Diagnosis not present

## 2015-02-15 DIAGNOSIS — F0391 Unspecified dementia with behavioral disturbance: Secondary | ICD-10-CM | POA: Diagnosis not present

## 2015-02-15 DIAGNOSIS — Z7982 Long term (current) use of aspirin: Secondary | ICD-10-CM | POA: Diagnosis not present

## 2015-02-15 DIAGNOSIS — F4310 Post-traumatic stress disorder, unspecified: Secondary | ICD-10-CM | POA: Diagnosis not present

## 2015-02-15 DIAGNOSIS — G253 Myoclonus: Secondary | ICD-10-CM | POA: Diagnosis not present

## 2015-02-15 DIAGNOSIS — R479 Unspecified speech disturbances: Secondary | ICD-10-CM | POA: Diagnosis not present

## 2015-02-15 DIAGNOSIS — F329 Major depressive disorder, single episode, unspecified: Secondary | ICD-10-CM | POA: Diagnosis not present

## 2015-02-15 DIAGNOSIS — F419 Anxiety disorder, unspecified: Secondary | ICD-10-CM | POA: Diagnosis not present

## 2015-02-15 DIAGNOSIS — M797 Fibromyalgia: Secondary | ICD-10-CM | POA: Diagnosis not present

## 2015-02-16 ENCOUNTER — Ambulatory Visit: Payer: Medicare PPO | Admitting: Podiatry

## 2015-02-16 DIAGNOSIS — F0391 Unspecified dementia with behavioral disturbance: Secondary | ICD-10-CM | POA: Diagnosis not present

## 2015-02-16 DIAGNOSIS — F329 Major depressive disorder, single episode, unspecified: Secondary | ICD-10-CM | POA: Diagnosis not present

## 2015-02-16 DIAGNOSIS — F4310 Post-traumatic stress disorder, unspecified: Secondary | ICD-10-CM | POA: Diagnosis not present

## 2015-02-16 DIAGNOSIS — R479 Unspecified speech disturbances: Secondary | ICD-10-CM | POA: Diagnosis not present

## 2015-02-16 DIAGNOSIS — F419 Anxiety disorder, unspecified: Secondary | ICD-10-CM | POA: Diagnosis not present

## 2015-02-16 DIAGNOSIS — Z7982 Long term (current) use of aspirin: Secondary | ICD-10-CM | POA: Diagnosis not present

## 2015-02-16 DIAGNOSIS — G253 Myoclonus: Secondary | ICD-10-CM | POA: Diagnosis not present

## 2015-02-16 DIAGNOSIS — M797 Fibromyalgia: Secondary | ICD-10-CM | POA: Diagnosis not present

## 2015-02-19 ENCOUNTER — Ambulatory Visit: Payer: Medicare PPO | Admitting: Neurology

## 2015-02-19 ENCOUNTER — Telehealth: Payer: Self-pay | Admitting: Neurology

## 2015-02-19 DIAGNOSIS — F332 Major depressive disorder, recurrent severe without psychotic features: Secondary | ICD-10-CM | POA: Diagnosis not present

## 2015-02-19 NOTE — Telephone Encounter (Signed)
Patient's husband(Mike) called requesting Kristen to return his call regarding referral. Please call and advise. He can be reached at [phone].

## 2015-02-19 NOTE — Telephone Encounter (Signed)
Returned pt's husband's phone call. He informed me that Dr. Alisia Ferrari changed some of the pt's medications around and wants to see if that helps the pt before going to see another neurologist for a second opinion. He said he would call back in a few weeks and let us know.

## 2015-02-20 DIAGNOSIS — F0391 Unspecified dementia with behavioral disturbance: Secondary | ICD-10-CM | POA: Diagnosis not present

## 2015-02-20 DIAGNOSIS — R479 Unspecified speech disturbances: Secondary | ICD-10-CM | POA: Diagnosis not present

## 2015-02-20 DIAGNOSIS — F4310 Post-traumatic stress disorder, unspecified: Secondary | ICD-10-CM | POA: Diagnosis not present

## 2015-02-20 DIAGNOSIS — M797 Fibromyalgia: Secondary | ICD-10-CM | POA: Diagnosis not present

## 2015-02-20 DIAGNOSIS — F419 Anxiety disorder, unspecified: Secondary | ICD-10-CM | POA: Diagnosis not present

## 2015-02-20 DIAGNOSIS — G253 Myoclonus: Secondary | ICD-10-CM | POA: Diagnosis not present

## 2015-02-20 DIAGNOSIS — F329 Major depressive disorder, single episode, unspecified: Secondary | ICD-10-CM | POA: Diagnosis not present

## 2015-02-20 DIAGNOSIS — Z7982 Long term (current) use of aspirin: Secondary | ICD-10-CM | POA: Diagnosis not present

## 2015-02-21 ENCOUNTER — Telehealth: Payer: Self-pay | Admitting: Neurology

## 2015-02-21 DIAGNOSIS — R479 Unspecified speech disturbances: Secondary | ICD-10-CM | POA: Diagnosis not present

## 2015-02-21 DIAGNOSIS — M797 Fibromyalgia: Secondary | ICD-10-CM | POA: Diagnosis not present

## 2015-02-21 DIAGNOSIS — F4310 Post-traumatic stress disorder, unspecified: Secondary | ICD-10-CM | POA: Diagnosis not present

## 2015-02-21 DIAGNOSIS — F419 Anxiety disorder, unspecified: Secondary | ICD-10-CM | POA: Diagnosis not present

## 2015-02-21 DIAGNOSIS — G253 Myoclonus: Secondary | ICD-10-CM | POA: Diagnosis not present

## 2015-02-21 DIAGNOSIS — Z7982 Long term (current) use of aspirin: Secondary | ICD-10-CM | POA: Diagnosis not present

## 2015-02-21 DIAGNOSIS — F329 Major depressive disorder, single episode, unspecified: Secondary | ICD-10-CM | POA: Diagnosis not present

## 2015-02-21 DIAGNOSIS — F0391 Unspecified dementia with behavioral disturbance: Secondary | ICD-10-CM | POA: Diagnosis not present

## 2015-02-21 NOTE — Telephone Encounter (Signed)
Received verbal order from Dr. Vickey Huger for a swallow test for Diane Richardson. Relayed this information to Lurena Joiner from [facility], but she did not answer her phone, left a message. Asked her to call me back with any questions or concerns.

## 2015-02-21 NOTE — Telephone Encounter (Addendum)
Lurena Joiner with [facility] called and requested that Dr. Vickey Huger give a verbal order for a swallow test for the patient. Please call and advise.   Drew-[phone]) Fax [phone])

## 2015-02-22 ENCOUNTER — Other Ambulatory Visit (HOSPITAL_COMMUNITY): Payer: Self-pay | Admitting: Neurology

## 2015-02-22 DIAGNOSIS — R1314 Dysphagia, pharyngoesophageal phase: Secondary | ICD-10-CM

## 2015-02-22 DIAGNOSIS — F329 Major depressive disorder, single episode, unspecified: Secondary | ICD-10-CM | POA: Diagnosis not present

## 2015-02-22 DIAGNOSIS — G253 Myoclonus: Secondary | ICD-10-CM | POA: Diagnosis not present

## 2015-02-22 DIAGNOSIS — F419 Anxiety disorder, unspecified: Secondary | ICD-10-CM | POA: Diagnosis not present

## 2015-02-22 DIAGNOSIS — Z7982 Long term (current) use of aspirin: Secondary | ICD-10-CM | POA: Diagnosis not present

## 2015-02-22 DIAGNOSIS — F0391 Unspecified dementia with behavioral disturbance: Secondary | ICD-10-CM | POA: Diagnosis not present

## 2015-02-22 DIAGNOSIS — F4310 Post-traumatic stress disorder, unspecified: Secondary | ICD-10-CM | POA: Diagnosis not present

## 2015-02-22 DIAGNOSIS — R479 Unspecified speech disturbances: Secondary | ICD-10-CM | POA: Diagnosis not present

## 2015-02-22 DIAGNOSIS — M797 Fibromyalgia: Secondary | ICD-10-CM | POA: Diagnosis not present

## 2015-02-23 ENCOUNTER — Encounter: Payer: Self-pay | Admitting: Neurology

## 2015-02-23 DIAGNOSIS — F4310 Post-traumatic stress disorder, unspecified: Secondary | ICD-10-CM | POA: Diagnosis not present

## 2015-02-23 DIAGNOSIS — R479 Unspecified speech disturbances: Secondary | ICD-10-CM | POA: Diagnosis not present

## 2015-02-23 DIAGNOSIS — M797 Fibromyalgia: Secondary | ICD-10-CM | POA: Diagnosis not present

## 2015-02-23 DIAGNOSIS — Z7982 Long term (current) use of aspirin: Secondary | ICD-10-CM | POA: Diagnosis not present

## 2015-02-23 DIAGNOSIS — F419 Anxiety disorder, unspecified: Secondary | ICD-10-CM | POA: Diagnosis not present

## 2015-02-23 DIAGNOSIS — F0391 Unspecified dementia with behavioral disturbance: Secondary | ICD-10-CM | POA: Diagnosis not present

## 2015-02-23 DIAGNOSIS — F329 Major depressive disorder, single episode, unspecified: Secondary | ICD-10-CM | POA: Diagnosis not present

## 2015-02-23 DIAGNOSIS — G253 Myoclonus: Secondary | ICD-10-CM | POA: Diagnosis not present

## 2015-02-26 ENCOUNTER — Ambulatory Visit: Payer: Medicare PPO | Admitting: Podiatry

## 2015-02-26 DIAGNOSIS — F332 Major depressive disorder, recurrent severe without psychotic features: Secondary | ICD-10-CM | POA: Diagnosis not present

## 2015-02-27 DIAGNOSIS — R479 Unspecified speech disturbances: Secondary | ICD-10-CM | POA: Diagnosis not present

## 2015-02-27 DIAGNOSIS — F0391 Unspecified dementia with behavioral disturbance: Secondary | ICD-10-CM | POA: Diagnosis not present

## 2015-02-27 DIAGNOSIS — F419 Anxiety disorder, unspecified: Secondary | ICD-10-CM | POA: Diagnosis not present

## 2015-02-27 DIAGNOSIS — Z7982 Long term (current) use of aspirin: Secondary | ICD-10-CM | POA: Diagnosis not present

## 2015-02-27 DIAGNOSIS — G253 Myoclonus: Secondary | ICD-10-CM | POA: Diagnosis not present

## 2015-02-27 DIAGNOSIS — F4310 Post-traumatic stress disorder, unspecified: Secondary | ICD-10-CM | POA: Diagnosis not present

## 2015-02-27 DIAGNOSIS — M797 Fibromyalgia: Secondary | ICD-10-CM | POA: Diagnosis not present

## 2015-02-27 DIAGNOSIS — F329 Major depressive disorder, single episode, unspecified: Secondary | ICD-10-CM | POA: Diagnosis not present

## 2015-02-28 ENCOUNTER — Ambulatory Visit: Payer: Medicare PPO | Admitting: Podiatry

## 2015-02-28 DIAGNOSIS — F4310 Post-traumatic stress disorder, unspecified: Secondary | ICD-10-CM | POA: Diagnosis not present

## 2015-02-28 DIAGNOSIS — F419 Anxiety disorder, unspecified: Secondary | ICD-10-CM | POA: Diagnosis not present

## 2015-02-28 DIAGNOSIS — Z7982 Long term (current) use of aspirin: Secondary | ICD-10-CM | POA: Diagnosis not present

## 2015-02-28 DIAGNOSIS — M797 Fibromyalgia: Secondary | ICD-10-CM | POA: Diagnosis not present

## 2015-02-28 DIAGNOSIS — G253 Myoclonus: Secondary | ICD-10-CM | POA: Diagnosis not present

## 2015-02-28 DIAGNOSIS — R479 Unspecified speech disturbances: Secondary | ICD-10-CM | POA: Diagnosis not present

## 2015-02-28 DIAGNOSIS — F0391 Unspecified dementia with behavioral disturbance: Secondary | ICD-10-CM | POA: Diagnosis not present

## 2015-02-28 DIAGNOSIS — F329 Major depressive disorder, single episode, unspecified: Secondary | ICD-10-CM | POA: Diagnosis not present

## 2015-02-28 DIAGNOSIS — F0632 Mood disorder due to known physiological condition with major depressive-like episode: Secondary | ICD-10-CM | POA: Diagnosis not present

## 2015-03-01 ENCOUNTER — Ambulatory Visit (HOSPITAL_COMMUNITY)
Admission: RE | Admit: 2015-03-01 | Discharge: 2015-03-01 | Disposition: A | Discharge status: Home / Self Care | Payer: Medicare PPO | Source: Ambulatory Visit | Attending: Neurology | Admitting: Neurology

## 2015-03-01 DIAGNOSIS — F0390 Unspecified dementia without behavioral disturbance: Secondary | ICD-10-CM | POA: Insufficient documentation

## 2015-03-01 DIAGNOSIS — R1310 Dysphagia, unspecified: Secondary | ICD-10-CM | POA: Insufficient documentation

## 2015-03-01 DIAGNOSIS — F0391 Unspecified dementia with behavioral disturbance: Secondary | ICD-10-CM | POA: Diagnosis not present

## 2015-03-01 DIAGNOSIS — F329 Major depressive disorder, single episode, unspecified: Secondary | ICD-10-CM | POA: Diagnosis not present

## 2015-03-01 DIAGNOSIS — R1314 Dysphagia, pharyngoesophageal phase: Secondary | ICD-10-CM

## 2015-03-01 DIAGNOSIS — G253 Myoclonus: Secondary | ICD-10-CM | POA: Diagnosis not present

## 2015-03-01 DIAGNOSIS — M797 Fibromyalgia: Secondary | ICD-10-CM | POA: Diagnosis not present

## 2015-03-01 DIAGNOSIS — Z7982 Long term (current) use of aspirin: Secondary | ICD-10-CM | POA: Diagnosis not present

## 2015-03-01 DIAGNOSIS — R479 Unspecified speech disturbances: Secondary | ICD-10-CM | POA: Diagnosis not present

## 2015-03-01 DIAGNOSIS — F419 Anxiety disorder, unspecified: Secondary | ICD-10-CM | POA: Diagnosis not present

## 2015-03-01 DIAGNOSIS — F4310 Post-traumatic stress disorder, unspecified: Secondary | ICD-10-CM | POA: Diagnosis not present

## 2015-03-02 DIAGNOSIS — F4310 Post-traumatic stress disorder, unspecified: Secondary | ICD-10-CM | POA: Diagnosis not present

## 2015-03-02 DIAGNOSIS — R479 Unspecified speech disturbances: Secondary | ICD-10-CM | POA: Diagnosis not present

## 2015-03-02 DIAGNOSIS — G253 Myoclonus: Secondary | ICD-10-CM | POA: Diagnosis not present

## 2015-03-02 DIAGNOSIS — F329 Major depressive disorder, single episode, unspecified: Secondary | ICD-10-CM | POA: Diagnosis not present

## 2015-03-02 DIAGNOSIS — M797 Fibromyalgia: Secondary | ICD-10-CM | POA: Diagnosis not present

## 2015-03-02 DIAGNOSIS — F419 Anxiety disorder, unspecified: Secondary | ICD-10-CM | POA: Diagnosis not present

## 2015-03-02 DIAGNOSIS — Z7982 Long term (current) use of aspirin: Secondary | ICD-10-CM | POA: Diagnosis not present

## 2015-03-02 DIAGNOSIS — F0391 Unspecified dementia with behavioral disturbance: Secondary | ICD-10-CM | POA: Diagnosis not present

## 2015-03-05 DIAGNOSIS — R600 Localized edema: Secondary | ICD-10-CM | POA: Diagnosis not present

## 2015-03-05 DIAGNOSIS — F332 Major depressive disorder, recurrent severe without psychotic features: Secondary | ICD-10-CM | POA: Diagnosis not present

## 2015-03-06 DIAGNOSIS — R479 Unspecified speech disturbances: Secondary | ICD-10-CM | POA: Diagnosis not present

## 2015-03-06 DIAGNOSIS — Z7982 Long term (current) use of aspirin: Secondary | ICD-10-CM | POA: Diagnosis not present

## 2015-03-06 DIAGNOSIS — F4310 Post-traumatic stress disorder, unspecified: Secondary | ICD-10-CM | POA: Diagnosis not present

## 2015-03-06 DIAGNOSIS — F329 Major depressive disorder, single episode, unspecified: Secondary | ICD-10-CM | POA: Diagnosis not present

## 2015-03-06 DIAGNOSIS — F419 Anxiety disorder, unspecified: Secondary | ICD-10-CM | POA: Diagnosis not present

## 2015-03-06 DIAGNOSIS — F0391 Unspecified dementia with behavioral disturbance: Secondary | ICD-10-CM | POA: Diagnosis not present

## 2015-03-06 DIAGNOSIS — G253 Myoclonus: Secondary | ICD-10-CM | POA: Diagnosis not present

## 2015-03-06 DIAGNOSIS — M797 Fibromyalgia: Secondary | ICD-10-CM | POA: Diagnosis not present

## 2015-03-07 ENCOUNTER — Telehealth: Payer: Self-pay | Admitting: Neurology

## 2015-03-07 DIAGNOSIS — F419 Anxiety disorder, unspecified: Secondary | ICD-10-CM | POA: Diagnosis not present

## 2015-03-07 DIAGNOSIS — F4310 Post-traumatic stress disorder, unspecified: Secondary | ICD-10-CM | POA: Diagnosis not present

## 2015-03-07 DIAGNOSIS — F329 Major depressive disorder, single episode, unspecified: Secondary | ICD-10-CM | POA: Diagnosis not present

## 2015-03-07 DIAGNOSIS — Z7982 Long term (current) use of aspirin: Secondary | ICD-10-CM | POA: Diagnosis not present

## 2015-03-07 DIAGNOSIS — F0391 Unspecified dementia with behavioral disturbance: Secondary | ICD-10-CM | POA: Diagnosis not present

## 2015-03-07 DIAGNOSIS — M797 Fibromyalgia: Secondary | ICD-10-CM | POA: Diagnosis not present

## 2015-03-07 DIAGNOSIS — R479 Unspecified speech disturbances: Secondary | ICD-10-CM | POA: Diagnosis not present

## 2015-03-07 DIAGNOSIS — G253 Myoclonus: Secondary | ICD-10-CM | POA: Diagnosis not present

## 2015-03-07 NOTE — Telephone Encounter (Signed)
Lurena [facility]inerRebecca with [facility] is calling to get a verbal order to extend visits for 2 times a weeks for 2 weeks for the patient. Please call and advise. Thank you.

## 2015-03-07 NOTE — Telephone Encounter (Signed)
Returned Conservation officer, natureebecca with [facility]'s phone call, informing her that Dr. Vickey Hugerohmeier is out of the office for the next two weeks but I will be able to provide orders when Dr. Vickey Hugerohmeier returns.

## 2015-03-08 DIAGNOSIS — F329 Major depressive disorder, single episode, unspecified: Secondary | ICD-10-CM | POA: Diagnosis not present

## 2015-03-08 DIAGNOSIS — F4310 Post-traumatic stress disorder, unspecified: Secondary | ICD-10-CM | POA: Diagnosis not present

## 2015-03-08 DIAGNOSIS — M797 Fibromyalgia: Secondary | ICD-10-CM | POA: Diagnosis not present

## 2015-03-08 DIAGNOSIS — G253 Myoclonus: Secondary | ICD-10-CM | POA: Diagnosis not present

## 2015-03-08 DIAGNOSIS — R479 Unspecified speech disturbances: Secondary | ICD-10-CM | POA: Diagnosis not present

## 2015-03-08 DIAGNOSIS — F419 Anxiety disorder, unspecified: Secondary | ICD-10-CM | POA: Diagnosis not present

## 2015-03-08 DIAGNOSIS — F0391 Unspecified dementia with behavioral disturbance: Secondary | ICD-10-CM | POA: Diagnosis not present

## 2015-03-08 DIAGNOSIS — Z7982 Long term (current) use of aspirin: Secondary | ICD-10-CM | POA: Diagnosis not present

## 2015-03-08 NOTE — Telephone Encounter (Signed)
error 

## 2015-03-09 DIAGNOSIS — M797 Fibromyalgia: Secondary | ICD-10-CM | POA: Diagnosis not present

## 2015-03-09 DIAGNOSIS — Z7982 Long term (current) use of aspirin: Secondary | ICD-10-CM | POA: Diagnosis not present

## 2015-03-09 DIAGNOSIS — F4310 Post-traumatic stress disorder, unspecified: Secondary | ICD-10-CM | POA: Diagnosis not present

## 2015-03-09 DIAGNOSIS — G253 Myoclonus: Secondary | ICD-10-CM | POA: Diagnosis not present

## 2015-03-09 DIAGNOSIS — F0391 Unspecified dementia with behavioral disturbance: Secondary | ICD-10-CM | POA: Diagnosis not present

## 2015-03-09 DIAGNOSIS — F329 Major depressive disorder, single episode, unspecified: Secondary | ICD-10-CM | POA: Diagnosis not present

## 2015-03-09 DIAGNOSIS — R479 Unspecified speech disturbances: Secondary | ICD-10-CM | POA: Diagnosis not present

## 2015-03-09 DIAGNOSIS — F419 Anxiety disorder, unspecified: Secondary | ICD-10-CM | POA: Diagnosis not present

## 2015-03-13 DIAGNOSIS — F332 Major depressive disorder, recurrent severe without psychotic features: Secondary | ICD-10-CM | POA: Diagnosis not present

## 2015-03-14 ENCOUNTER — Ambulatory Visit (INDEPENDENT_AMBULATORY_CARE_PROVIDER_SITE_OTHER): Payer: Medicare PPO | Admitting: Podiatry

## 2015-03-14 ENCOUNTER — Encounter: Payer: Self-pay | Admitting: Podiatry

## 2015-03-14 DIAGNOSIS — F419 Anxiety disorder, unspecified: Secondary | ICD-10-CM | POA: Diagnosis not present

## 2015-03-14 DIAGNOSIS — M797 Fibromyalgia: Secondary | ICD-10-CM | POA: Diagnosis not present

## 2015-03-14 DIAGNOSIS — F4310 Post-traumatic stress disorder, unspecified: Secondary | ICD-10-CM | POA: Diagnosis not present

## 2015-03-14 DIAGNOSIS — F329 Major depressive disorder, single episode, unspecified: Secondary | ICD-10-CM | POA: Diagnosis not present

## 2015-03-14 DIAGNOSIS — M79673 Pain in unspecified foot: Secondary | ICD-10-CM

## 2015-03-14 DIAGNOSIS — Z7982 Long term (current) use of aspirin: Secondary | ICD-10-CM | POA: Diagnosis not present

## 2015-03-14 DIAGNOSIS — M2012 Hallux valgus (acquired), left foot: Secondary | ICD-10-CM | POA: Diagnosis not present

## 2015-03-14 DIAGNOSIS — M2042 Other hammer toe(s) (acquired), left foot: Secondary | ICD-10-CM | POA: Diagnosis not present

## 2015-03-14 DIAGNOSIS — R479 Unspecified speech disturbances: Secondary | ICD-10-CM | POA: Diagnosis not present

## 2015-03-14 DIAGNOSIS — G253 Myoclonus: Secondary | ICD-10-CM | POA: Diagnosis not present

## 2015-03-14 DIAGNOSIS — F0391 Unspecified dementia with behavioral disturbance: Secondary | ICD-10-CM | POA: Diagnosis not present

## 2015-03-14 NOTE — Progress Notes (Signed)
   Subjective:    Patient ID: Diane Richardson, female    DOB: 08/22/1951, 64 y.o.   MRN: [id number]  HPI  N-ITCHING, BURNING, DRY SKIN L-B/L FEET D-1 YEAR O-SLOWLY C-BETTER' A-N/A T- NEOSPORIN, RX. KEPLEX  The main symptoms appear to be discomfort between the left hallux and second left toe. She describes using a bandage on the toe, however, these areas remain uncomfortable   Patient's caregivers in the treatment room today. She states that patient had adverse reactions to psychiatric medication which is cause some additional cognitive problems. She was last evaluated on 01/26/2015 by Dr. Irving ShowsEgerton who diagnosed a bunion and hammertoe and dystrophic second toenail area. A digital pad was dispensed at that visit, however, did not reduce the symptoms adequately.   Review of Systems  Musculoskeletal: Positive for gait problem.  Skin: Positive for color change.  All other systems reviewed and are negative. a   Objective:   Physical Exam  Patient seems to be able to answer questions and appears to be orientated Patient's caregiver present to treatment room today  Vascular: No peripheral edema noted bilaterally DP and PT pulses 2/4 bilaterally Capillary reflex immediate bilaterally  Neurological: Sensation to 10 g monofilament wire intact 5/5 bilaterally Ankle reflexes equal reactive bilaterally   Dermatological: Dry scaling skin plantarly bilaterally The toenails are incurvated, discolored 6-10  Musculoskeletal: HAV deformity left Hammertoe second left Range of motion ankle to 90 bilaterally     Assessment & Plan:   Assessment: Satisfactory neurovascular status Friction irritation been HAV and hammertoe second left  Plan: Reviewed the results of exam with patient and caregiver today. At this time I am recommending a gel toe separator to insert in between hallux and second left toe to reduce the friction rub between these areas. I dispensed and place this gelcap toe  separator in between the hallux and second left toe.. Patient placed shoe and walk with the gelcap in between your toes and stated this relieved greatly over symptoms  Reappoint at patient's request

## 2015-03-14 NOTE — Patient Instructions (Signed)
Place gel toe separator in between left great toe and second toe daily

## 2015-03-15 DIAGNOSIS — R479 Unspecified speech disturbances: Secondary | ICD-10-CM | POA: Diagnosis not present

## 2015-03-15 DIAGNOSIS — G253 Myoclonus: Secondary | ICD-10-CM | POA: Diagnosis not present

## 2015-03-15 DIAGNOSIS — F329 Major depressive disorder, single episode, unspecified: Secondary | ICD-10-CM | POA: Diagnosis not present

## 2015-03-15 DIAGNOSIS — F4310 Post-traumatic stress disorder, unspecified: Secondary | ICD-10-CM | POA: Diagnosis not present

## 2015-03-15 DIAGNOSIS — M797 Fibromyalgia: Secondary | ICD-10-CM | POA: Diagnosis not present

## 2015-03-15 DIAGNOSIS — F419 Anxiety disorder, unspecified: Secondary | ICD-10-CM | POA: Diagnosis not present

## 2015-03-15 DIAGNOSIS — Z7982 Long term (current) use of aspirin: Secondary | ICD-10-CM | POA: Diagnosis not present

## 2015-03-15 DIAGNOSIS — F0391 Unspecified dementia with behavioral disturbance: Secondary | ICD-10-CM | POA: Diagnosis not present

## 2015-03-16 DIAGNOSIS — S7002XA Contusion of left hip, initial encounter: Secondary | ICD-10-CM | POA: Diagnosis not present

## 2015-03-19 ENCOUNTER — Telehealth: Payer: Self-pay

## 2015-03-19 ENCOUNTER — Other Ambulatory Visit: Payer: Self-pay

## 2015-03-19 DIAGNOSIS — F0390 Unspecified dementia without behavioral disturbance: Secondary | ICD-10-CM

## 2015-03-19 NOTE — Telephone Encounter (Signed)
Left a message with Lurena JoinerRebecca telling her that Dr. Vickey Hugerohmeier gave a verbal to extend the speech pathology visits for 2 times a week for 2 weeks.

## 2015-03-20 DIAGNOSIS — F332 Major depressive disorder, recurrent severe without psychotic features: Secondary | ICD-10-CM | POA: Diagnosis not present

## 2015-03-21 DIAGNOSIS — G253 Myoclonus: Secondary | ICD-10-CM | POA: Diagnosis not present

## 2015-03-21 DIAGNOSIS — F329 Major depressive disorder, single episode, unspecified: Secondary | ICD-10-CM | POA: Diagnosis not present

## 2015-03-21 DIAGNOSIS — F0391 Unspecified dementia with behavioral disturbance: Secondary | ICD-10-CM | POA: Diagnosis not present

## 2015-03-21 DIAGNOSIS — R479 Unspecified speech disturbances: Secondary | ICD-10-CM | POA: Diagnosis not present

## 2015-03-21 DIAGNOSIS — M797 Fibromyalgia: Secondary | ICD-10-CM | POA: Diagnosis not present

## 2015-03-21 DIAGNOSIS — F4310 Post-traumatic stress disorder, unspecified: Secondary | ICD-10-CM | POA: Diagnosis not present

## 2015-03-21 DIAGNOSIS — Z7982 Long term (current) use of aspirin: Secondary | ICD-10-CM | POA: Diagnosis not present

## 2015-03-21 DIAGNOSIS — F419 Anxiety disorder, unspecified: Secondary | ICD-10-CM | POA: Diagnosis not present

## 2015-03-22 DIAGNOSIS — G253 Myoclonus: Secondary | ICD-10-CM | POA: Diagnosis not present

## 2015-03-22 DIAGNOSIS — R479 Unspecified speech disturbances: Secondary | ICD-10-CM | POA: Diagnosis not present

## 2015-03-22 DIAGNOSIS — M797 Fibromyalgia: Secondary | ICD-10-CM | POA: Diagnosis not present

## 2015-03-22 DIAGNOSIS — F4310 Post-traumatic stress disorder, unspecified: Secondary | ICD-10-CM | POA: Diagnosis not present

## 2015-03-22 DIAGNOSIS — F419 Anxiety disorder, unspecified: Secondary | ICD-10-CM | POA: Diagnosis not present

## 2015-03-22 DIAGNOSIS — F0391 Unspecified dementia with behavioral disturbance: Secondary | ICD-10-CM | POA: Diagnosis not present

## 2015-03-22 DIAGNOSIS — Z7982 Long term (current) use of aspirin: Secondary | ICD-10-CM | POA: Diagnosis not present

## 2015-03-22 DIAGNOSIS — F329 Major depressive disorder, single episode, unspecified: Secondary | ICD-10-CM | POA: Diagnosis not present

## 2015-03-26 ENCOUNTER — Other Ambulatory Visit: Payer: Self-pay

## 2015-03-26 ENCOUNTER — Telehealth: Payer: Self-pay | Admitting: Neurology

## 2015-03-26 DIAGNOSIS — R296 Repeated falls: Secondary | ICD-10-CM

## 2015-03-26 DIAGNOSIS — R410 Disorientation, unspecified: Secondary | ICD-10-CM

## 2015-03-26 DIAGNOSIS — F05 Delirium due to known physiological condition: Principal | ICD-10-CM

## 2015-03-26 NOTE — Telephone Encounter (Signed)
Patients husband called and requested to speak with Loyola MastKristen RN. Please call and advise.

## 2015-03-26 NOTE — Telephone Encounter (Signed)
Returned pt's phone call, no answer, left message asking pt's husband to call me back.

## 2015-03-26 NOTE — Telephone Encounter (Signed)
Spoke to pt's husband. He reports that the pt is improving, a little every day. He is asking if Dr. Vickey Hugerohmeier would give another PT order. He says that the PT has really helped her and they are hoping to do one more round of it. Will ask Dr. Vickey Hugerohmeier and get the order.

## 2015-03-27 DIAGNOSIS — F332 Major depressive disorder, recurrent severe without psychotic features: Secondary | ICD-10-CM | POA: Diagnosis not present

## 2015-04-05 ENCOUNTER — Encounter: Payer: Self-pay | Admitting: Physical Therapy

## 2015-04-05 ENCOUNTER — Ambulatory Visit: Payer: Medicare PPO | Attending: Nurse Practitioner | Admitting: Physical Therapy

## 2015-04-05 DIAGNOSIS — R2681 Unsteadiness on feet: Secondary | ICD-10-CM

## 2015-04-05 DIAGNOSIS — R531 Weakness: Secondary | ICD-10-CM | POA: Diagnosis not present

## 2015-04-05 DIAGNOSIS — R269 Unspecified abnormalities of gait and mobility: Secondary | ICD-10-CM

## 2015-04-05 DIAGNOSIS — R293 Abnormal posture: Secondary | ICD-10-CM | POA: Diagnosis not present

## 2015-04-06 NOTE — Therapy (Signed)
[facility] [address] [place], [place], [address] Phone: [phone]   Fax:  [phone]  Physical Therapy Evaluation  Patient Details  Name: Diane Richardson MRN: [id number] Date of Birth: 09-Jul-1951 Referring Provider:  Merri Brunette, MD  Encounter Date: 04/05/2015      PT End of Session - 04/05/15 1100    Visit Number 1   Number of Visits 16   Date for PT Re-Evaluation 06/01/15   PT Start Time 1108   PT Stop Time 1150   PT Time Calculation (min) 42 min   Equipment Utilized During Treatment Gait belt   Activity Tolerance Patient tolerated treatment well   Behavior During Therapy [facility] for tasks assessed/performed      Past Medical History  Diagnosis Date  . Fibromyalgia   . PTSD (post-traumatic stress disorder)   . Anxiety   . Depression   . DVT (deep venous thrombosis)   . PE (pulmonary embolism)   . ADD (attention deficit disorder)   . Muscle spasm     bad  . Fatigue     chronic    Past Surgical History  Procedure Laterality Date  . Childbirth  1989    x1,NVD  . Pulmonary embolism surgery  1983    x9 days  . Foot surgery Right     pt states she had bunion surgery right foot.    There were no vitals filed for this visit.  Visit Diagnosis:  Postural instability  Abnormality of gait  Unsteadiness  Generalized weakness      Subjective Assessment - 04/05/15 1115    Subjective This 64yo female was referred to PT for evaluation for frequent falls. 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   Patient Stated Goals Get strong, walk further & better, return to swimming   Currently in Pain? No/denies            [facility] PT Assessment - 04/05/15 1100    Assessment   Medical Diagnosis frequent falls   Precautions   Precautions Fall   Restrictions   Weight Bearing Restrictions No   Balance Screen   Has the patient fallen in  the past 6 months Yes   How many times? 3-4 times per week   Has the patient had a decrease in activity level because of a fear of falling?  Yes   Is the patient reluctant to leave their home because of a fear of falling?  Yes   Home Environment   Living Environment Private residence   Living Arrangements Spouse/significant other;Children   Type of Home House   Home Access Stairs to enter   Entrance Stairs-Number of Steps 3   Entrance Stairs-Rails None   Home Layout Two level;Able to live on main level with bedroom/bathroom;Full bath on main level   Prior Function   Level of Independence Independent;Independent with household mobility without device;Independent with community mobility without device;Independent with gait   Leisure Enjoys horse back riding; walks nearly a mile everyday; has enjoyed swimming, but not as much recently due to increased saliva on medication   Coordination   Gross Motor Movements are Fluid and Coordinated No   Coordination and Movement Description right LE slow intiation and movement   Finger Nose Finger Test WNL   Posture/Postural Control   Posture/Postural Control Postural limitations   Postural Limitations Rounded Shoulders;Forward head;Increased lumbar lordosis;Flexed trunk;Weight shift left  stands & sits with right  ankle plantarflexion   Tone   Assessment Location Right Lower Extremity   ROM / Strength   AROM / PROM / Strength PROM;Strength   AROM   Right Ankle Dorsiflexion -12   PROM   Right/Left Ankle Right   Strength   Right/Left Hip Right   Right Hip Flexion 3+/5   Right Hip Extension 3-/5   Right Hip ABduction 3+/5   Right/Left Knee Right   Right Knee Flexion 3+/5   Right Knee Extension 4-/5   Right/Left Ankle Right   Right Ankle Dorsiflexion 3-/5   Right Ankle Plantar Flexion 3+/5   Right Ankle Inversion 3+/5   Right Ankle Eversion 3+/5   Transfers   Transfers Sit to Stand;Stand to Sit   Sit to Stand 5: Supervision;With upper  extremity assist;With armrests;From chair/3-in-1  uses back of legs against chair to stabilize   Stand to Sit 5: Supervision;With upper extremity assist;With armrests;To chair/3-in-1  uses back of legs against chair to control descent   Ambulation/Gait   Ambulation/Gait Yes   Ambulation/Gait Assistance 4: Min assist  MinA for balance losses   Ambulation Distance (Feet) 120 Feet   Assistive device None;Straight cane  arrived using cane incorrectly, assessed with cane & without   Gait Pattern Step-through pattern;Decreased step length - left;Decreased stance time - right;Decreased stride length;Decreased hip/knee flexion - right;Decreased dorsiflexion - right;Decreased weight shift to right;Right circumduction;Right flexed knee in stance;Right foot flat;Lateral trunk lean to left;Abducted- right;Poor foot clearance - right  right ankle plantarflexed initial contact w/forefoot, stance   Ambulation Surface Indoor;Level   Gait velocity 2.88 ft/sec   Gait velocity - backwards slowed   Stairs Yes   Stairs Assistance 5: Supervision   Stairs Assistance Details (indicate cue type and reason) plantarflexion right ankle   Stair Management Technique Two rails;Alternating pattern;Forwards   Number of Stairs 4   Berg Balance Test   Sit to Stand Needs minimal aid to stand or to stabilize  uses back of legs against chair to stabilize to stand no arm   Standing Unsupported Able to stand safely 2 minutes   Sitting with Back Unsupported but Feet Supported on Floor or Stool Able to sit safely and securely 2 minutes   Stand to Sit Uses backs of legs against chair to control descent   Transfers Able to transfer safely, minor use of hands   Standing Unsupported with Eyes Closed Able to stand 10 seconds with supervision   Standing Ubsupported with Feet Together Able to place feet together independently and stand for 1 minute with supervision   From Standing, Reach Forward with Outstretched Arm Can reach forward  >12 cm safely (5")   From Standing Position, Pick up Object from Floor Able to pick up shoe, needs supervision   From Standing Position, Turn to Look Behind Over each Shoulder Looks behind one side only/other side shows less weight shift   Turn 360 Degrees Needs close supervision or verbal cueing   Standing Unsupported, Alternately Place Feet on Step/Stool Able to complete 4 steps without aid or supervision   Standing Unsupported, One Foot in Front Able to take small step independently and hold 30 seconds   Standing on One Leg Tries to lift leg/unable to hold 3 seconds but remains standing independently   Total Score 36   Dynamic Gait Index   Level Surface Moderate Impairment   Change in Gait Speed Moderate Impairment   Gait with Horizontal Head Turns Moderate Impairment   Gait with Vertical Head Turns  Moderate Impairment   Gait and Pivot Turn Mild Impairment   Step Over Obstacle Moderate Impairment   Step Around Obstacles Mild Impairment   Steps Mild Impairment   Total Score 11   Timed Up and Go Test   Normal TUG (seconds) 12.07  no device with balance loss in turn   Cognitive TUG (seconds) 12.79  no device, increase 6.0% (not significant) decreased process   TUG Comments decreased processing during gait with 1 error   Functional Gait  Assessment   Gait assessed  Yes   Gait Level Surface Walks 20 ft in less than 7 sec but greater than 5.5 sec, uses assistive device, slower speed, mild gait deviations, or deviates 6-10 in outside of the 12 in walkway width.   Change in Gait Speed Makes only minor adjustments to walking speed, or accomplishes a change in speed with significant gait deviations, deviates 10-15 in outside the 12 in walkway width, or changes speed but loses balance but is able to recover and continue walking.   Gait with Horizontal Head Turns Performs head turns with moderate changes in gait velocity, slows down, deviates 10-15 in outside 12 in walkway width but recovers, can  continue to walk.   Gait with Vertical Head Turns Performs task with moderate change in gait velocity, slows down, deviates 10-15 in outside 12 in walkway width but recovers, can continue to walk.   Gait and Pivot Turn Pivot turns safely in greater than 3 sec and stops with no loss of balance, or pivot turns safely within 3 sec and stops with mild imbalance, requires small steps to catch balance.   Step Over Obstacle Is able to step over one shoe box (4.5 in total height) but must slow down and adjust steps to clear box safely. May require verbal cueing.   Gait with Narrow Base of Support Ambulates less than 4 steps heel to toe or cannot perform without assistance.   Gait with Eyes Closed Walks 20 ft, slow speed, abnormal gait pattern, evidence for imbalance, deviates 10-15 in outside 12 in walkway width. Requires more than 9 sec to ambulate 20 ft.   Ambulating Backwards Walks 20 ft, slow speed, abnormal gait pattern, evidence for imbalance, deviates 10-15 in outside 12 in walkway width.   Steps Alternating feet, must use rail.   Total Score 12   RLE Tone   RLE Tone Hypertonic;Modified Ashworth   RLE Tone   Modified Ashworth Scale for Grading Hypertonia RLE Slight increase in muscle tone, manifested by a catch, followed by minimal resistance throughout the remainder (less than half) of the ROM                             PT Short Term Goals - 04/05/15 1100    PT SHORT TERM GOAL #1   Title Patient demonstrates / vrebalizes initial HEP (Target Date: 05/04/2015)   Time 4   Period Weeks   Status New   PT SHORT TERM GOAL #2   Title Berg Balance >40/56  (Target Date: 05/04/2015)   Time 4   Period Weeks   Status New   PT SHORT TERM GOAL #3   Title Functional Gait Assessment >16/30  (Target Date: 05/04/2015)   Time 4   Period Weeks   Status New           PT Long Term Goals - 04/05/15 1100    PT LONG TERM GOAL #1  Title Patient demonstrates / verbalizes ongoing HEP /  fitness plan.  (Target Date: 06/01/2015)   Time 8   Period Weeks   Status New   PT LONG TERM GOAL #2   Title Pt will improve Dynamic Gait Index score to at least 19/24 for decreased fall risk.  (Target Date: 06/01/2015)   Time 8   Period Weeks   Status New   PT LONG TERM GOAL #3   Title Pt will improve Berg Balance score to at least 51/56 for decreased fall risk.  (Target Date: 06/01/2015)   Time 8   Period Weeks   Status New   PT LONG TERM GOAL #4   Title Functional Gait Assessment >/= 19/30  (Target Date: 06/01/2015)   Time 8   Period Weeks   Status New   PT LONG TERM GOAL #5   Title Pt will ambulate at least 1000 ft, indoor/outdoor surfaces, independently with no loss of balance, for improved gait in outdoor areas.  (Target Date: 06/01/2015)   Time 8   Period Weeks   Status New               Plan - 2015-05-05 1100    Clinical Impression Statement Patient has increased inbalance with increased incidence of falls recently. She reports testing has shown some medications maybe why she has some of neurological issues and medications have been stopped in last 4 weeks. Patient sits, stands and ambulates with right ankle plantarflexion which decreases balance. She has developed decreased right ankle ROM. PT testing indicates high risk of falls: decreased processing with cognitive TUG, Berg Balance 36/56 (<45/56), Dynamic Gait Index 11/24 (<19/24), Functional Gait Assessment 12/30 (<19/30).                             Pt will benefit from skilled therapeutic intervention in order to improve on the following deficits Abnormal gait;Decreased activity tolerance;Decreased balance;Decreased endurance;Decreased mobility;Decreased range of motion;Decreased strength;Postural dysfunction   Rehab Potential Good   PT Frequency 2x / week   PT Duration 8 weeks   PT Treatment/Interventions ADLs/Self Care Home Management;Gait training;Stair training;Functional mobility training;Therapeutic  activities;Therapeutic exercise;Balance training;Patient/family education;Neuromuscular re-education;Vestibular;Orthotic Fit/Training   PT Next Visit Plan Check & update HEP, gait with walking sticks, Sensory Organization Test   Consulted and Agree with Plan of Care Patient          G-Codes - 05/05/15 1100    Functional Assessment Tool Used Berg Balance 36/56, Dynamic Gait Index 11/24   Functional Limitation Mobility: Walking and moving around   Mobility: Walking and Moving Around Current Status [phone]) At least 60 percent but less than 80 percent impaired, limited or restricted   Mobility: Walking and Moving Around Goal Status [phone]) At least 20 percent but less than 40 percent impaired, limited or restricted       Problem List Patient Active Problem List   Diagnosis Date Noted  . Other psychotic disorder not due to substance or known physiological condition 01/17/2015  . Rigors 01/17/2015  . 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 Progressive dementia with uncertain etiology 01/17/2015  . OCD (obsessive compulsive disorder) 10/16/2014  . Delusional disorder 10/16/2014  . Mild neurocognitive disorder 10/10/2014  . Mild benzodiazepine use disorder 10/10/2014  . Anxiety disorder 10/10/2014  . Anxiety 08/29/2014  . H/O deep venous thrombosis 08/29/2014  . Healed or old pulmonary embolism 08/29/2014  . Paranoia     Wrenly Lauritsen PT, DPT 04/06/2015, 12:03 PM  [place] [address] [place], [place], [address] Phone: [phone]   Fax:  [phone]

## 2015-04-11 ENCOUNTER — Ambulatory Visit: Payer: Medicare PPO | Attending: Nurse Practitioner | Admitting: Physical Therapy

## 2015-04-11 ENCOUNTER — Encounter: Payer: Self-pay | Admitting: Physical Therapy

## 2015-04-11 DIAGNOSIS — R2681 Unsteadiness on feet: Secondary | ICD-10-CM | POA: Diagnosis not present

## 2015-04-11 DIAGNOSIS — F0632 Mood disorder due to known physiological condition with major depressive-like episode: Secondary | ICD-10-CM | POA: Diagnosis not present

## 2015-04-11 DIAGNOSIS — R531 Weakness: Secondary | ICD-10-CM

## 2015-04-11 DIAGNOSIS — R269 Unspecified abnormalities of gait and mobility: Secondary | ICD-10-CM | POA: Diagnosis not present

## 2015-04-11 DIAGNOSIS — R293 Abnormal posture: Secondary | ICD-10-CM | POA: Diagnosis not present

## 2015-04-11 NOTE — Therapy (Signed)
[facility] [address] [place], [place], [address] Phone: [phone]   Fax:  [phone]  Physical Therapy Treatment  Patient Details  Name: Diane Richardson MRN: [id number] Date of Birth: [birth year] Referring Provider:  Merri Brunette, MD  Encounter Date: 04/11/2015      PT End of Session - 04/11/15 1333    Visit Number 2   Number of Visits 16   Date for PT Re-Evaluation 06/01/15   PT Start Time 1317   PT Stop Time 1400   PT Time Calculation (min) 43 min   Equipment Utilized During Treatment Gait belt   Activity Tolerance Patient tolerated treatment well   Behavior During Therapy [facility] for tasks assessed/performed      Past Medical History  Diagnosis Date  . Fibromyalgia   . PTSD (post-traumatic stress disorder)   . Anxiety   . Depression   . DVT (deep venous thrombosis)   . PE (pulmonary embolism)   . ADD (attention deficit disorder)   . Muscle spasm     bad  . Fatigue     chronic    Past Surgical History  Procedure Laterality Date  . Childbirth  1989    x1,NVD  . Pulmonary embolism surgery  1983    x9 days  . Foot surgery Right     pt states she had bunion surgery right foot.    There were no vitals filed for this visit.  Visit Diagnosis:  Abnormality of gait  Unsteadiness  Generalized weakness      Subjective Assessment - 04/11/15 1325    Subjective Has had one fall outside since her last visist on a sidewalk. Triped on a break in the sidewalk when she looked up to finalize whether to go up the ramp or the stairs into [place]. DId hit her left hip, has some soreness and buising. No pain with movement, just with point pressure. Has not seen her MD for this. Also, saw her psycologist today, he has increased her Cymbalta to help control Fibromylagia pain.                                               Currently in Pain? Yes   Pain Score 4    Pain Location Ankle  has multiple aches/pains, had  her isolate the worst area   Pain Orientation Right   Pain Descriptors / Indicators Aching   Pain Type Chronic pain  h/o Fibromyalgia   Pain Onset More than a month ago   Pain Frequency Other (Comment)  worse in am, gets better as day progressess   Aggravating Factors  increased activity aggravates all her pain   Pain Relieving Factors rest, cymbalta (MD just increased it)     Treatment: Self Care: discussed proper shoe wear. Information given on [employer] for pt to look into more comfortable shoes that still support her foot.  Neuro re-ed: sensory organization test performed with following results: Conditions: 1: above norm all 3 2: above norm all 3 3: above norm all 3 4: above norm, below norm, above norm 5: fall all 3 6: fall all 3 Composite score: 50  Sensory Analysis Som: above norm (>75) Vis: above norm (>75) Vest: barley on chart, significantly below normal (<5) Pref: above norm (>80  Strategy analysis Ankle dominant noted  COG alignment  Mostly centered  with some left preference noted        PT Short Term Goals - 04/05/15 1100    PT SHORT TERM GOAL #1   Title Patient demonstrates / vrebalizes initial HEP (Target Date: 05/04/2015)   Time 4   Period Weeks   Status New   PT SHORT TERM GOAL #2   Title Berg Balance >40/56  (Target Date: 05/04/2015)   Time 4   Period Weeks   Status New   PT SHORT TERM GOAL #3   Title Functional Gait Assessment >16/30  (Target Date: 05/04/2015)   Time 4   Period Weeks   Status New           PT Long Term Goals - 04/05/15 1100    PT LONG TERM GOAL #1   Title Patient demonstrates / verbalizes ongoing HEP / fitness plan.  (Target Date: 06/01/2015)   Time 8   Period Weeks   Status New   PT LONG TERM GOAL #2   Title Pt will improve Dynamic Gait Index score to at least 19/24 for decreased fall risk.  (Target Date: 06/01/2015)   Time 8   Period Weeks   Status New   PT LONG TERM GOAL #3   Title Pt will improve Berg  Balance score to at least 51/56 for decreased fall risk.  (Target Date: 06/01/2015)   Time 8   Period Weeks   Status New   PT LONG TERM GOAL #4   Title Functional Gait Assessment >/= 19/30  (Target Date: 06/01/2015)   Time 8   Period Weeks   Status New   PT LONG TERM GOAL #5   Title Pt will ambulate at least 1000 ft, indoor/outdoor surfaces, independently with no loss of balance, for improved gait in outdoor areas.  (Target Date: 06/01/2015)   Time 8   Period Weeks   Status New           Plan - 04/11/15 1333    Clinical Impression Statement Sensory Organization Test performed today (see note for results). Pt with significant vestibular hypofunction. Will focus next session on HEP to address vestibular system. Pt also given information on [employer] to obtain shoes that she is comfortable in and that will provide support/stability needed with gait. Pt making progress toward goals.                                                Pt will benefit from skilled therapeutic intervention in order to improve on the following deficits Abnormal gait;Decreased activity tolerance;Decreased balance;Decreased endurance;Decreased mobility;Decreased range of motion;Decreased strength;Postural dysfunction   Rehab Potential Good   PT Frequency 2x / week   PT Duration 8 weeks   PT Treatment/Interventions ADLs/Self Care Home Management;Gait training;Stair training;Functional mobility training;Therapeutic activities;Therapeutic exercise;Balance training;Patient/family education;Neuromuscular re-education;Vestibular;Orthotic Fit/Training   PT Next Visit Plan HEP for vestibular hypofunction (look at previous HEP issued during last episode of care for reference);gait with walking sticks   PT Home Exercise Plan 8/3: current HEP is mostly water exercises and swimming    Consulted and Agree with Plan of Care Patient        Problem List Patient Active Problem List   Diagnosis Date Noted  . Other psychotic  disorder not due to substance or known physiological condition 01/17/2015  . Rigors 01/17/2015  . 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  Progressive dementia with uncertain etiology 01/17/2015  . OCD (obsessive compulsive disorder) 10/16/2014  . Delusional disorder 10/16/2014  . Mild neurocognitive disorder 10/10/2014  . Mild benzodiazepine use disorder 10/10/2014  . Anxiety disorder 10/10/2014  . Anxiety 08/29/2014  . H/O deep venous thrombosis 08/29/2014  . Healed or old pulmonary embolism 08/29/2014  . Paranoia     Sallyanne Kuster 04/11/2015, 11:28 PM  Sallyanne Kuster, PTA, [facility] [address], [address] [place], [place] [address] [phone] 04/11/2015, 11:28 PM

## 2015-04-12 ENCOUNTER — Ambulatory Visit: Payer: Medicare PPO | Admitting: Physical Therapy

## 2015-04-12 ENCOUNTER — Encounter: Payer: Self-pay | Admitting: Physical Therapy

## 2015-04-12 DIAGNOSIS — R2681 Unsteadiness on feet: Secondary | ICD-10-CM

## 2015-04-12 DIAGNOSIS — R293 Abnormal posture: Secondary | ICD-10-CM | POA: Diagnosis not present

## 2015-04-12 DIAGNOSIS — R269 Unspecified abnormalities of gait and mobility: Secondary | ICD-10-CM | POA: Diagnosis not present

## 2015-04-12 DIAGNOSIS — R531 Weakness: Secondary | ICD-10-CM | POA: Diagnosis not present

## 2015-04-12 NOTE — Patient Instructions (Signed)
Feet Together, Head Motion - Eyes Closed   In corner with chair in front of you for safety:With eyes closed and feet together: 1. 1. move head slowly, up and down. 2. Move head slowly, left and right 3. Move head slowly in diagonal pattern, both way Repeat 10 times each way.  Do __1-2__ sessions per day.  Copyright  VHI. All rights reserved.  Feet Apart (Compliant Surface) Varied Arm Positions - Eyes Closed  In corner with chair in front of you: Stand on compliant surface: pillows/thick cushion with feet shoulder width apart and ARMS AS NEEDED FOR BALANCE. Close eyes and visualize upright position. Hold__15__ seconds. Repeat __3__ times per session. Do _1-2Copyright  VHI. All rights reserved.  Feet Apart (Compliant Surface) Head Motion - Eyes Closed   In corner with chair in front of you: Stand on compliant surface: pillows/thick cushion with feet shoulder width apart. Close eyes and move head: 1. slowly, up and down 2. Slowly, left and right 3. Slowly, diagonals both ways Repeat _10__ times each.  Do _1-2_ sessions per day.  Copyright  VHI. All rights reserved.  Side to Side Head Motion   Perform next to counter to use for balance as needed. Walking on solid surface, turn head and eyes to left for _2___ steps. Then, turn head and eyes straight ahead for __2__ steps. Then, turn head and eyes to opposite side for __2__ steps. Repeat sequence __4__ laps per session. Do __1-2__ sessions per day.   Copyright  VHI. All rights reserved.  Up / Down Head Motion   Perform next to counter for balance as needed. Walking on solid surface, move head and eyes toward ceiling for __1-2__ steps. Then, move head and eyes straight ahead for _1-2___ steps. Then, move head and eyes toward floor for _1-2___ steps. Repeat __4__ laps per session. Do __1-2__ sessions per day.  Copyright  VHI. All rights reserved.   Fall Prevention and Home Safety Falls cause injuries and can affect all age  groups. It is possible to prevent falls.  HOW TO PREVENT FALLS  Wear shoes with rubber soles that do not have an opening for your toes.  Keep the inside and outside of your house well lit.  Use night lights throughout your home.  Remove clutter from floors.  Clean up floor spills.  Remove throw rugs or fasten them to the floor with carpet tape.  Do not place electrical cords across pathways.  Put grab bars by your tub, shower, and toilet. Do not use towel bars as grab bars.  Put handrails on both sides of the stairway. Fix loose handrails.  Do not climb on stools or stepladders, if possible.  Do not wax your floors.  Repair uneven or unsafe sidewalks, walkways, or stairs.  Keep items you use a lot within reach.  Be aware of pets.  Keep emergency numbers next to the telephone.  Put smoke detectors in your home and near bedrooms. Ask your doctor what other things you can do to prevent falls. Document Released: 06/21/2009 Document Revised: 02/24/2012 Document Reviewed: 11/25/2011 [facility] Patient Information 2015 [place], [place]. This information is not intended to replace advice given to you by your health care provider. Make sure you discuss any questions you have with your health care provider.

## 2015-04-13 ENCOUNTER — Ambulatory Visit: Payer: Self-pay

## 2015-04-13 NOTE — Therapy (Signed)
[facility] [address] [place], [place], [address] Phone: [phone]   Fax:  [phone]  Physical Therapy Treatment  Patient Details  Name: Diane Richardson MRN: [id number] Date of Birth: 25-May-1951 Referring Provider:  Merri Brunette, MD  Encounter Date: 04/12/2015   04/12/15 1151  PT Visits / Re-Eval  Visit Number 3  Number of Visits 16  Date for PT Re-Evaluation 06/01/15  PT Time Calculation  PT Start Time 1147  PT Stop Time 1228  PT Time Calculation (min) 41 min  PT - End of Session  Equipment Utilized During Treatment Gait belt  Activity Tolerance Patient tolerated treatment well  Behavior During Therapy [facility] for tasks assessed/performed     Past Medical History  Diagnosis Date  . Fibromyalgia   . PTSD (post-traumatic stress disorder)   . Anxiety   . Depression   . DVT (deep venous thrombosis)   . PE (pulmonary embolism)   . ADD (attention deficit disorder)   . Muscle spasm     bad  . Fatigue     chronic    Past Surgical History  Procedure Laterality Date  . Childbirth  1989    x1,NVD  . Pulmonary embolism surgery  1983    x9 days  . Foot surgery Right     pt states she had bunion surgery right foot.     04/12/15 1149  Symptoms/Limitations  Subjective No new complaints. No falls since yesterday. Felt "like I have some work to do" after yesterday's session. No pain currently. Does still have some pain with pressure on left hip.  Pain Assessment  Currently in Pain? No/denies  Pain Score 0   There were no vitals filed for this visit.  Visit Diagnosis:  Abnormality of gait  Unsteadiness  Generalized weakness  Postural instability   Treatment: Neuro Re-ed: pt educated on balance and vestibular exercises for home. Refer to pt education/instruction section for full details.  Self Care: Discussed fall prevention strategies and issued written material.        PT Education -  04/13/15 2009    Education provided Yes   Education Details HEP: corner balance (feet together on floor eyes closes head movemetns, feet apart on pillow eyes closed head movements), walking with head nods/turns. Fall prevention strategies.                        Person(s) Educated Patient   Methods Explanation;Demonstration;Handout   Comprehension Verbalized understanding;Returned demonstration          PT Short Term Goals - 04/05/15 1100    PT SHORT TERM GOAL #1   Title Patient demonstrates / vrebalizes initial HEP (Target Date: 05/04/2015)   Time 4   Period Weeks   Status New   PT SHORT TERM GOAL #2   Title Berg Balance >40/56  (Target Date: 05/04/2015)   Time 4   Period Weeks   Status New   PT SHORT TERM GOAL #3   Title Functional Gait Assessment >16/30  (Target Date: 05/04/2015)   Time 4   Period Weeks   Status New           PT Long Term Goals - 04/05/15 1100    PT LONG TERM GOAL #1   Title Patient demonstrates / verbalizes ongoing HEP / fitness plan.  (Target Date: 06/01/2015)   Time 8   Period Weeks   Status New   PT LONG TERM GOAL #2  Title Pt will improve Dynamic Gait Index score to at least 19/24 for decreased fall risk.  (Target Date: 06/01/2015)   Time 8   Period Weeks   Status New   PT LONG TERM GOAL #3   Title Pt will improve Berg Balance score to at least 51/56 for decreased fall risk.  (Target Date: 06/01/2015)   Time 8   Period Weeks   Status New   PT LONG TERM GOAL #4   Title Functional Gait Assessment >/= 19/30  (Target Date: 06/01/2015)   Time 8   Period Weeks   Status New   PT LONG TERM GOAL #5   Title Pt will ambulate at least 1000 ft, indoor/outdoor surfaces, independently with no loss of balance, for improved gait in outdoor areas.  (Target Date: 06/01/2015)   Time 8   Period Weeks   Status New        04/12/15 1151  Plan  Clinical Impression Statement Educated pt on and issued HEP today to address vestibular hypofunction/balance without  any issues reported by patient. Steady progress toward goals being made.  Pt will benefit from skilled therapeutic intervention in order to improve on the following deficits Abnormal gait;Decreased activity tolerance;Decreased balance;Decreased endurance;Decreased mobility;Decreased range of motion;Decreased strength;Postural dysfunction  Rehab Potential Good  PT Frequency 2x / week  PT Duration 8 weeks  PT Treatment/Interventions ADLs/Self Care Home Management;Gait training;Stair training;Functional mobility training;Therapeutic activities;Therapeutic exercise;Balance training;Patient/family education;Neuromuscular re-education;Vestibular;Orthotic Fit/Training  PT Next Visit Plan Gait with walking sticks;balance and vestibular exercises  PT Home Exercise Plan 8/3: current HEP is mostly water exercises and swimming   Consulted and Agree with Plan of Care Patient     Problem List Patient Active Problem List   Diagnosis Date Noted  . Other psychotic disorder not due to substance or known physiological condition 01/17/2015  . Rigors 01/17/2015  . 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 Progressive dementia with uncertain etiology 01/17/2015  . OCD (obsessive compulsive disorder) 10/16/2014  . Delusional disorder 10/16/2014  . Mild neurocognitive disorder 10/10/2014  . Mild benzodiazepine use disorder 10/10/2014  . Anxiety disorder 10/10/2014  . Anxiety 08/29/2014  . H/O deep venous thrombosis 08/29/2014  . Healed or old pulmonary embolism 08/29/2014  . Paranoia     Sallyanne Kuster 04/13/2015, 8:11 PM  Sallyanne Kuster, PTA, [facility] [address], [address] [place], [place] [address] [phone] 04/13/2015, 8:14 PM

## 2015-04-17 ENCOUNTER — Encounter: Payer: Self-pay | Admitting: Physical Therapy

## 2015-04-17 ENCOUNTER — Ambulatory Visit: Payer: Medicare PPO | Admitting: Physical Therapy

## 2015-04-17 DIAGNOSIS — R2681 Unsteadiness on feet: Secondary | ICD-10-CM | POA: Diagnosis not present

## 2015-04-17 DIAGNOSIS — R269 Unspecified abnormalities of gait and mobility: Secondary | ICD-10-CM | POA: Diagnosis not present

## 2015-04-17 DIAGNOSIS — R293 Abnormal posture: Secondary | ICD-10-CM | POA: Diagnosis not present

## 2015-04-17 DIAGNOSIS — R531 Weakness: Secondary | ICD-10-CM | POA: Diagnosis not present

## 2015-04-17 DIAGNOSIS — F332 Major depressive disorder, recurrent severe without psychotic features: Secondary | ICD-10-CM | POA: Diagnosis not present

## 2015-04-17 NOTE — Therapy (Signed)
[facility] [address] [place], [place], [address] Phone: [phone]   Fax:  [phone]  Physical Therapy Treatment  Patient Details  Name: Diane Richardson MRN: [id number] Date of Birth: 1950/09/27 Referring Provider:  Merri Brunette, MD  Encounter Date: 04/17/2015      PT End of Session - 04/17/15 0929    Visit Number 4   Number of Visits 16   PT Start Time 0846   PT Stop Time 0930   PT Time Calculation (min) 44 min   Equipment Utilized During Treatment Gait belt   Activity Tolerance Patient tolerated treatment well   Behavior During Therapy [facility] for tasks assessed/performed      Past Medical History  Diagnosis Date  . Fibromyalgia   . PTSD (post-traumatic stress disorder)   . Anxiety   . Depression   . DVT (deep venous thrombosis)   . PE (pulmonary embolism)   . ADD (attention deficit disorder)   . Muscle spasm     bad  . Fatigue     chronic    Past Surgical History  Procedure Laterality Date  . Childbirth  1989    x1,NVD  . Pulmonary embolism surgery  1983    x9 days  . Foot surgery Right     pt states she had bunion surgery right foot.    There were no vitals filed for this visit.  Visit Diagnosis:  Abnormality of gait  Unsteadiness  Generalized weakness      Subjective Assessment - 04/17/15 0858    Subjective I have been doing the HEP they seem to be helping; one problem is my calfs get real tight and when I first stand up it makes me walk odd. Denies fall and no new complaints   Currently in Pain? No/denies       Neuromuscular re-ed Ambulating;(sba/gait belt)-turning head side to side looking at playing cards/negotiating obstacles/turns Calf stretch on 15 deg wedge x 5 30 sec hold; off step 30 sec hold x 3 Tandem walk on foam balance beam; forward/ back  Static stance on foam beam with head mvt side to side; up and down   Yoga poses;  Tree ( single leg stance) Warrior one;   Feet wide apart   2 x each side; vq's for breathing and posture                          PT Education - 04/17/15 0928    Education provided Yes   Education Details Continue wth HEP; reviewed concepts;  instructed to add calf stretch to daily ex's   Person(s) Educated Patient   Methods Demonstration   Comprehension Returned demonstration          PT Short Term Goals - 04/05/15 1100    PT SHORT TERM GOAL #1   Title Patient demonstrates / vrebalizes initial HEP (Target Date: 05/04/2015)   Time 4   Period Weeks   Status New   PT SHORT TERM GOAL #2   Title Berg Balance >40/56  (Target Date: 05/04/2015)   Time 4   Period Weeks   Status New   PT SHORT TERM GOAL #3   Title Functional Gait Assessment >16/30  (Target Date: 05/04/2015)   Time 4   Period Weeks   Status New           PT Long Term Goals - 04/05/15 1100    PT LONG TERM GOAL #1  Title Patient demonstrates / verbalizes ongoing HEP / fitness plan.  (Target Date: 06/01/2015)   Time 8   Period Weeks   Status New   PT LONG TERM GOAL #2   Title Pt will improve Dynamic Gait Index score to at least 19/24 for decreased fall risk.  (Target Date: 06/01/2015)   Time 8   Period Weeks   Status New   PT LONG TERM GOAL #3   Title Pt will improve Berg Balance score to at least 51/56 for decreased fall risk.  (Target Date: 06/01/2015)   Time 8   Period Weeks   Status New   PT LONG TERM GOAL #4   Title Functional Gait Assessment >/= 19/30  (Target Date: 06/01/2015)   Time 8   Period Weeks   Status New   PT LONG TERM GOAL #5   Title Pt will ambulate at least 1000 ft, indoor/outdoor surfaces, independently with no loss of balance, for improved gait in outdoor areas.  (Target Date: 06/01/2015)   Time 8   Period Weeks   Status New               Plan - 04/17/15 1914    Clinical Impression Statement Patient was able to walk and move head randomly to scan environment w/o loss of balance; her static stance  she lost balance but regained independenlty with step reflex; She can tolerated high level balance training   Rehab Potential Good   PT Frequency 2x / week   PT Duration 8 weeks        Problem List Patient Active Problem List   Diagnosis Date Noted  . Other psychotic disorder not due to substance or known physiological condition 01/17/2015  . Rigors 01/17/2015  . 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 Progressive dementia with uncertain etiology 01/17/2015  . OCD (obsessive compulsive disorder) 10/16/2014  . Delusional disorder 10/16/2014  . Mild neurocognitive disorder 10/10/2014  . Mild benzodiazepine use disorder 10/10/2014  . Anxiety disorder 10/10/2014  . Anxiety 08/29/2014  . H/O deep venous thrombosis 08/29/2014  . Healed or old pulmonary embolism 08/29/2014  . Paranoia     Cecilie Kicks PT, DPT 04/17/2015, 9:33 AM  [facility] [address] [place], [place], [address] Phone: [phone]   Fax:  [phone]

## 2015-04-19 ENCOUNTER — Ambulatory Visit: Payer: Medicare PPO | Admitting: Neurology

## 2015-04-19 ENCOUNTER — Ambulatory Visit: Payer: Medicare PPO | Admitting: Physical Therapy

## 2015-04-24 DIAGNOSIS — F332 Major depressive disorder, recurrent severe without psychotic features: Secondary | ICD-10-CM | POA: Diagnosis not present

## 2015-04-25 ENCOUNTER — Ambulatory Visit: Payer: Medicare PPO | Admitting: Physical Therapy

## 2015-04-25 ENCOUNTER — Encounter: Payer: Self-pay | Admitting: Physical Therapy

## 2015-04-25 DIAGNOSIS — R293 Abnormal posture: Secondary | ICD-10-CM | POA: Diagnosis not present

## 2015-04-25 DIAGNOSIS — R269 Unspecified abnormalities of gait and mobility: Secondary | ICD-10-CM

## 2015-04-25 DIAGNOSIS — R2681 Unsteadiness on feet: Secondary | ICD-10-CM

## 2015-04-25 DIAGNOSIS — R531 Weakness: Secondary | ICD-10-CM | POA: Diagnosis not present

## 2015-04-25 NOTE — Patient Instructions (Signed)
Feet Together, Head Motion - Eyes Closed   In corner with chair in front of you for safety:With eyes closed and feet together: 1. 1. move head slowly, up and down. 2. Move head slowly, left and right 3. Move head slowly in diagonal pattern, both way Repeat 10 times each way. Do __1-2__ sessions per day.  Copyright  VHI. All rights reserved.  Feet Apart (Compliant Surface) Varied Arm Positions - Eyes Closed  In corner with chair in front of you: Stand on compliant surface: pillows/thick cushion with feet shoulder width apart and ARMS AS NEEDED FOR BALANCE. Close eyes and visualize upright position. Hold__15__ seconds. Repeat __3__ times per session. Do _1-2Copyright  VHI. All rights reserved.  Feet Apart (Compliant Surface) Head Motion - Eyes Closed   In corner with chair in front of you: Stand on compliant surface: pillows/thick cushion with feet shoulder width apart. Close eyes and move head: 1. slowly, up and down 2. Slowly, left and right 3. Slowly, diagonals both ways Repeat _10__ times each. Do _1-2_ sessions per day.  Copyright  VHI. All rights reserved.  Side to Side Head Motion   Perform next to counter to use for balance as needed. Walking on solid surface, turn head and eyes to left for _2___ steps. Then, turn head and eyes straight ahead for __2__ steps. Then, turn head and eyes to opposite side for __2__ steps. Repeat sequence __4__ laps per session. Do __1-2__ sessions per day.   Copyright  VHI. All rights reserved.  Up / Down Head Motion   Perform next to counter for balance as needed. Walking on solid surface, move head and eyes toward ceiling for __1-2__ steps. Then, move head and eyes straight ahead for _1-2___ steps. Then, move head and eyes toward floor for _1-2___ steps. Repeat __4__ laps per session. Do __1-2__ sessions per day.  Copyright  VHI. All rights reserved.

## 2015-04-25 NOTE — Therapy (Signed)
[facility] [address] [place], [place], [address] Phone: [phone]   Fax:  [phone]  Physical Therapy Treatment  Patient Details  Name: Diane Richardson MRN: [id number] Date of Birth: 02-Apr-1951 Referring Provider:  Merri Brunette, MD  Encounter Date: 04/25/2015      PT End of Session - 04/25/15 1241    Visit Number 5   Number of Visits 16   Date for PT Re-Evaluation 06/01/15   PT Start Time 1233   PT Stop Time 1313   PT Time Calculation (min) 40 min   Equipment Utilized During Treatment Gait belt   Activity Tolerance Patient tolerated treatment well   Behavior During Therapy [facility] for tasks assessed/performed      Past Medical History  Diagnosis Date  . Fibromyalgia   . PTSD (post-traumatic stress disorder)   . Anxiety   . Depression   . DVT (deep venous thrombosis)   . PE (pulmonary embolism)   . ADD (attention deficit disorder)   . Muscle spasm     bad  . Fatigue     chronic    Past Surgical History  Procedure Laterality Date  . Childbirth  1989    x1,NVD  . Pulmonary embolism surgery  1983    x9 days  . Foot surgery Right     pt states she had bunion surgery right foot.    There were no vitals filed for this visit.  Visit Diagnosis:  Abnormality of gait  Unsteadiness  Generalized weakness  Postural instability      Subjective Assessment - 04/25/15 1238    Subjective No new complaints. No falls. Has not been doing her HEP regularly due to traveling. Reports trying to incorportate them into daily activities as she can.    Currently in Pain? Yes   Pain Score 7    Pain Location Hand   Pain Orientation Left;Right   Pain Descriptors / Indicators Aching;Sore   Pain Type Chronic pain  h/o fibromyalgia   Pain Onset More than a month ago   Pain Frequency Intermittent   Aggravating Factors  increased activity aggrivates all her pain   Pain Relieving Factors rest, cymbalta      Treatment: Neuro Re-ed Red mats next to counter: intermittent UE support on counter with min guard assist to min assist for balance High knee marches, toe walk, heel walk, tandem walk. 3 laps each both forward and backwards.  Blue foam beam: standing with feet across foam beam in parallel bars, min guard assist and occasional UE support on parallel bars - alternating forward heel taps and backward toe taps x 10 each bil legs with intermittent UE support on bars - eyes closed no head movements, eyes closed head nods/shakes/diagonals both ways x 10 reps each/each way.          PT Short Term Goals - 04/05/15 1100    PT SHORT TERM GOAL #1   Title Patient demonstrates / vrebalizes initial HEP (Target Date: 05/04/2015)   Time 4   Period Weeks   Status New   PT SHORT TERM GOAL #2   Title Berg Balance >40/56  (Target Date: 05/04/2015)   Time 4   Period Weeks   Status New   PT SHORT TERM GOAL #3   Title Functional Gait Assessment >16/30  (Target Date: 05/04/2015)   Time 4   Period Weeks   Status New           PT Long Term  Goals - 04/05/15 1100    PT LONG TERM GOAL #1   Title Patient demonstrates / verbalizes ongoing HEP / fitness plan.  (Target Date: 06/01/2015)   Time 8   Period Weeks   Status New   PT LONG TERM GOAL #2   Title Pt will improve Dynamic Gait Index score to at least 19/24 for decreased fall risk.  (Target Date: 06/01/2015)   Time 8   Period Weeks   Status New   PT LONG TERM GOAL #3   Title Pt will improve Berg Balance score to at least 51/56 for decreased fall risk.  (Target Date: 06/01/2015)   Time 8   Period Weeks   Status New   PT LONG TERM GOAL #4   Title Functional Gait Assessment >/= 19/30  (Target Date: 06/01/2015)   Time 8   Period Weeks   Status New   PT LONG TERM GOAL #5   Title Pt will ambulate at least 1000 ft, indoor/outdoor surfaces, independently with no loss of balance, for improved gait in outdoor areas.  (Target Date: 06/01/2015)   Time  8   Period Weeks   Status New               Plan - 04/25/15 1241    Clinical Impression Statement Pt challenged with balance activities on compliant surfaces, increased support/assist needed with eyes closed. Pt making steady progress toward goals.   Pt will benefit from skilled therapeutic intervention in order to improve on the following deficits Abnormal gait;Decreased activity tolerance;Decreased balance;Decreased endurance;Decreased mobility;Decreased range of motion;Decreased strength;Postural dysfunction   Rehab Potential Good   PT Frequency 2x / week   PT Duration 8 weeks   PT Treatment/Interventions ADLs/Self Care Home Management;Gait training;Stair training;Functional mobility training;Therapeutic activities;Therapeutic exercise;Balance training;Patient/family education;Neuromuscular re-education;Vestibular;Orthotic Fit/Training   PT Next Visit Plan Gait with walking sticks;balance and vestibular exercises   PT Home Exercise Plan 8/3: current HEP is mostly water exercises and swimming    Consulted and Agree with Plan of Care Patient        Problem List Patient Active Problem List   Diagnosis Date Noted  . Other psychotic disorder not due to substance or known physiological condition 01/17/2015  . Rigors 01/17/2015  . 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 Progressive dementia with uncertain etiology 01/17/2015  . OCD (obsessive compulsive disorder) 10/16/2014  . Delusional disorder 10/16/2014  . Mild neurocognitive disorder 10/10/2014  . Mild benzodiazepine use disorder 10/10/2014  . Anxiety disorder 10/10/2014  . Anxiety 08/29/2014  . H/O deep venous thrombosis 08/29/2014  . Healed or old pulmonary embolism 08/29/2014  . Paranoia     Sallyanne Kuster 04/26/2015, 6:06 PM  Sallyanne Kuster, PTA, [facility] [address], [address] [place], [place] [address] [phone] 04/26/2015, 6:06 PM

## 2015-04-27 ENCOUNTER — Ambulatory Visit: Payer: Medicare PPO | Admitting: Physical Therapy

## 2015-04-27 ENCOUNTER — Encounter: Payer: Self-pay | Admitting: Physical Therapy

## 2015-04-27 DIAGNOSIS — R531 Weakness: Secondary | ICD-10-CM | POA: Diagnosis not present

## 2015-04-27 DIAGNOSIS — R293 Abnormal posture: Secondary | ICD-10-CM

## 2015-04-27 DIAGNOSIS — R2681 Unsteadiness on feet: Secondary | ICD-10-CM | POA: Diagnosis not present

## 2015-04-27 DIAGNOSIS — R269 Unspecified abnormalities of gait and mobility: Secondary | ICD-10-CM

## 2015-04-27 NOTE — Therapy (Signed)
[facility] [address] [place], [place], [address] Phone: [phone]   Fax:  [phone]  Physical Therapy Treatment  Patient Details  Name: Diane Richardson MRN: [id number] Date of Birth: 11-May-1951 Referring Provider:  Merri Brunette, MD  Encounter Date: 04/27/2015      PT End of Session - 04/27/15 0938    Visit Number 6   Number of Visits 16   Date for PT Re-Evaluation 06/01/15   PT Start Time 0932   PT Stop Time 1015   PT Time Calculation (min) 43 min   Equipment Utilized During Treatment Gait belt   Activity Tolerance Patient tolerated treatment well   Behavior During Therapy [facility] for tasks assessed/performed      Past Medical History  Diagnosis Date  . Fibromyalgia   . PTSD (post-traumatic stress disorder)   . Anxiety   . Depression   . DVT (deep venous thrombosis)   . PE (pulmonary embolism)   . ADD (attention deficit disorder)   . Muscle spasm     bad  . Fatigue     chronic    Past Surgical History  Procedure Laterality Date  . Childbirth  1989    x1,NVD  . Pulmonary embolism surgery  1983    x9 days  . Foot surgery Right     pt states she had bunion surgery right foot.    There were no vitals filed for this visit.  Visit Diagnosis:  Abnormality of gait  Unsteadiness  Generalized weakness  Postural instability      Subjective Assessment - 04/27/15 0936    Subjective No new complaints. No falls. Was tired after last session. Did have some bil hand pain this am, however none at this time.   Currently in Pain? No/denies   Pain Score 0-No pain            [facility] Adult PT Treatment/Exercise - 04/27/15 0939    Ambulation/Gait   Ambulation/Gait Yes   Ambulation/Gait Assistance 5: Supervision   Ambulation Distance (Feet) 1000 Feet   Assistive device None   Gait Pattern Step-through pattern;Decreased stride length;Narrow base of support;Shuffle  occasiona foot scuffing noted  with gait outdoors   Ambulation Surface Level;Unlevel;Indoor;Outdoor;Paved;Gravel;Grass     Neuro Re-ed: On red mats: With 6 cones in a row along edge: alternating toe taps to each with side stepping, alternating double toe taps toe each with side stepping and alternating flipping over/up to each with side stepping. 1 lap each/each way. Min guard assit to min assit for balance with cues on technique and weight shifting.  Balance Board in parallel bars:anterior/posteior directions with no UE support Eyes closed no head movements, eyes closed head nods/shakes/diagonals both ways  Blue foam beam: in parallel bars with intermittent UE support Alternating forward heel taps and backward toe taps Eyes closed no head movements, eyes closed head nods/shakes/diagonals both ways         PT Short Term Goals - 04/05/15 1100    PT SHORT TERM GOAL #1   Title Patient demonstrates / vrebalizes initial HEP (Target Date: 05/04/2015)   Time 4   Period Weeks   Status New   PT SHORT TERM GOAL #2   Title Berg Balance >40/56  (Target Date: 05/04/2015)   Time 4   Period Weeks   Status New   PT SHORT TERM GOAL #3   Title Functional Gait Assessment >16/30  (Target Date: 05/04/2015)   Time 4   Period Weeks  Status New           PT Long Term Goals - 04/05/15 1100    PT LONG TERM GOAL #1   Title Patient demonstrates / verbalizes ongoing HEP / fitness plan.  (Target Date: 06/01/2015)   Time 8   Period Weeks   Status New   PT LONG TERM GOAL #2   Title Pt will improve Dynamic Gait Index score to at least 19/24 for decreased fall risk.  (Target Date: 06/01/2015)   Time 8   Period Weeks   Status New   PT LONG TERM GOAL #3   Title Pt will improve Berg Balance score to at least 51/56 for decreased fall risk.  (Target Date: 06/01/2015)   Time 8   Period Weeks   Status New   PT LONG TERM GOAL #4   Title Functional Gait Assessment >/= 19/30  (Target Date: 06/01/2015)   Time 8   Period Weeks   Status  New   PT LONG TERM GOAL #5   Title Pt will ambulate at least 1000 ft, indoor/outdoor surfaces, independently with no loss of balance, for improved gait in outdoor areas.  (Target Date: 06/01/2015)   Time 8   Period Weeks   Status New            Plan - 04/27/15 1610    Clinical Impression Statement Pt with improved balance on outdoor complaint surfaces today vs with other sessions. Continues to be challenged with balance activities on complaint surfaces. Pt progressing toward goals.   Pt will benefit from skilled therapeutic intervention in order to improve on the following deficits Abnormal gait;Decreased activity tolerance;Decreased balance;Decreased endurance;Decreased mobility;Decreased range of motion;Decreased strength;Postural dysfunction   Rehab Potential Good   PT Frequency 2x / week   PT Duration 8 weeks   PT Treatment/Interventions ADLs/Self Care Home Management;Gait training;Stair training;Functional mobility training;Therapeutic activities;Therapeutic exercise;Balance training;Patient/family education;Neuromuscular re-education;Vestibular;Orthotic Fit/Training   PT Next Visit Plan balance and vestibular exercises   PT Home Exercise Plan 8/3: current HEP is mostly water exercises and swimming    Consulted and Agree with Plan of Care Patient        Problem List Patient Active Problem List   Diagnosis Date Noted  . Other psychotic disorder not due to substance or known physiological condition 01/17/2015  . Rigors 01/17/2015  . 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 Progressive dementia with uncertain etiology 01/17/2015  . OCD (obsessive compulsive disorder) 10/16/2014  . Delusional disorder 10/16/2014  . Mild neurocognitive disorder 10/10/2014  . Mild benzodiazepine use disorder 10/10/2014  . Anxiety disorder 10/10/2014  . Anxiety 08/29/2014  . H/O deep venous thrombosis 08/29/2014  . Healed or old pulmonary embolism 08/29/2014  . Paranoia     Sallyanne Kuster 04/27/2015, 8:01 PM  Sallyanne Kuster, PTA, [facility] Outpatient Neuro [facility] [address] [place], [place] [address] [phone] 04/27/2015, 8:01 PM

## 2015-04-30 ENCOUNTER — Encounter: Payer: Self-pay | Admitting: Physical Therapy

## 2015-04-30 ENCOUNTER — Ambulatory Visit: Payer: Medicare PPO | Admitting: Physical Therapy

## 2015-04-30 ENCOUNTER — Other Ambulatory Visit (HOSPITAL_COMMUNITY): Payer: Self-pay | Admitting: Rheumatology

## 2015-04-30 DIAGNOSIS — R531 Weakness: Secondary | ICD-10-CM

## 2015-04-30 DIAGNOSIS — R269 Unspecified abnormalities of gait and mobility: Secondary | ICD-10-CM

## 2015-04-30 DIAGNOSIS — G4700 Insomnia, unspecified: Secondary | ICD-10-CM | POA: Diagnosis not present

## 2015-04-30 DIAGNOSIS — M791 Myalgia: Secondary | ICD-10-CM | POA: Diagnosis not present

## 2015-04-30 DIAGNOSIS — R2681 Unsteadiness on feet: Secondary | ICD-10-CM

## 2015-04-30 DIAGNOSIS — M797 Fibromyalgia: Secondary | ICD-10-CM | POA: Diagnosis not present

## 2015-04-30 DIAGNOSIS — R52 Pain, unspecified: Secondary | ICD-10-CM

## 2015-04-30 DIAGNOSIS — R5381 Other malaise: Secondary | ICD-10-CM | POA: Diagnosis not present

## 2015-04-30 NOTE — Therapy (Signed)
[facility] [address] [place], [place], [address] Phone: [phone]   Fax:  [phone]  Physical Therapy Treatment  Patient Details  Name: Diane Richardson MRN: [id number] Date of Birth: 01-16-1951 Referring Provider:  Merri Brunette, MD  Encounter Date: 04/30/2015      PT End of Session - 04/30/15 1248    Visit Number 6  did not see pt, therefore visit number same   Number of Visits 16   Date for PT Re-Evaluation 06/01/15   PT Start Time 1231   PT Stop Time 1238   PT Time Calculation (min) 7 min   Equipment Utilized During Treatment Gait belt   Activity Tolerance Patient tolerated treatment well   Behavior During Therapy [facility] for tasks assessed/performed      Past Medical History  Diagnosis Date  . Fibromyalgia   . PTSD (post-traumatic stress disorder)   . Anxiety   . Depression   . DVT (deep venous thrombosis)   . PE (pulmonary embolism)   . ADD (attention deficit disorder)   . Muscle spasm     bad  . Fatigue     chronic    Past Surgical History  Procedure Laterality Date  . Childbirth  1989    x1,NVD  . Pulmonary embolism surgery  1983    x9 days  . Foot surgery Right     pt states she had bunion surgery right foot.    There were no vitals filed for this visit.  Visit Diagnosis:  Generalized weakness  Unsteadiness  Abnormality of gait      Subjective Assessment - 04/30/15 1246    Subjective Pt reports she saw Dr Corliss Skains this am who is referring her for an ultrasound to rule out DVT in her legs/calves. Scheduled for tomorrow morning. Pt not on any anticoagulants/blood thinner's currently.   Currently in Pain? No/denies   Pain Score 0-No pain           PT Short Term Goals - 04/05/15 1100    PT SHORT TERM GOAL #1   Title Patient demonstrates / vrebalizes initial HEP (Target Date: 05/04/2015)   Time 4   Period Weeks   Status New   PT SHORT TERM GOAL #2   Title Berg Balance  >40/56  (Target Date: 05/04/2015)   Time 4   Period Weeks   Status New   PT SHORT TERM GOAL #3   Title Functional Gait Assessment >16/30  (Target Date: 05/04/2015)   Time 4   Period Weeks   Status New           PT Long Term Goals - 04/05/15 1100    PT LONG TERM GOAL #1   Title Patient demonstrates / verbalizes ongoing HEP / fitness plan.  (Target Date: 06/01/2015)   Time 8   Period Weeks   Status New   PT LONG TERM GOAL #2   Title Pt will improve Dynamic Gait Index score to at least 19/24 for decreased fall risk.  (Target Date: 06/01/2015)   Time 8   Period Weeks   Status New   PT LONG TERM GOAL #3   Title Pt will improve Berg Balance score to at least 51/56 for decreased fall risk.  (Target Date: 06/01/2015)   Time 8   Period Weeks   Status New   PT LONG TERM GOAL #4   Title Functional Gait Assessment >/= 19/30  (Target Date: 06/01/2015)   Time 8   Period  Weeks   Status New   PT LONG TERM GOAL #5   Title Pt will ambulate at least 1000 ft, indoor/outdoor surfaces, independently with no loss of balance, for improved gait in outdoor areas.  (Target Date: 06/01/2015)   Time 8   Period Weeks   Status New            Plan - 04/30/15 1249    Clinical Impression Statement Pt's session cancelled today due to possible blood clot and not on medication for it. Pt to have ultrasound tomorrow am to r/o DVT. Will follow up with [place] after that.    Pt will benefit from skilled therapeutic intervention in order to improve on the following deficits Abnormal gait;Decreased activity tolerance;Decreased balance;Decreased endurance;Decreased mobility;Decreased range of motion;Decreased strength;Postural dysfunction   Rehab Potential Good   PT Frequency 2x / week   PT Duration 8 weeks   PT Treatment/Interventions ADLs/Self Care Home Management;Gait training;Stair training;Functional mobility training;Therapeutic activities;Therapeutic exercise;Balance training;Patient/family  education;Neuromuscular re-education;Vestibular;Orthotic Fit/Training   PT Next Visit Plan balance and vestibular exercises   PT Home Exercise Plan 8/3: current HEP is mostly water exercises and swimming    Consulted and Agree with Plan of Care Patient        Problem List Patient Active Problem List   Diagnosis Date Noted  . Other psychotic disorder not due to substance or known physiological condition 01/17/2015  . Rigors 01/17/2015  . 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 Progressive dementia with uncertain etiology 01/17/2015  . OCD (obsessive compulsive disorder) 10/16/2014  . Delusional disorder 10/16/2014  . Mild neurocognitive disorder 10/10/2014  . Mild benzodiazepine use disorder 10/10/2014  . Anxiety disorder 10/10/2014  . Anxiety 08/29/2014  . H/O deep venous thrombosis 08/29/2014  . Healed or old pulmonary embolism 08/29/2014  . Paranoia     Sallyanne Kuster 04/30/2015, 12:50 PM  Sallyanne Kuster, PTA, [facility] Outpatient Neuro [facility] [address], [address] [place], [place] [address] [phone] 04/30/2015, 12:50 PM

## 2015-05-01 ENCOUNTER — Ambulatory Visit (HOSPITAL_COMMUNITY)
Admission: RE | Admit: 2015-05-01 | Discharge: 2015-05-01 | Disposition: A | Discharge status: Home / Self Care | Payer: Medicare PPO | Source: Ambulatory Visit | Attending: Surgery | Admitting: Surgery

## 2015-05-01 DIAGNOSIS — Z86711 Personal history of pulmonary embolism: Secondary | ICD-10-CM | POA: Insufficient documentation

## 2015-05-01 DIAGNOSIS — M7989 Other specified soft tissue disorders: Secondary | ICD-10-CM | POA: Diagnosis not present

## 2015-05-01 DIAGNOSIS — F332 Major depressive disorder, recurrent severe without psychotic features: Secondary | ICD-10-CM | POA: Diagnosis not present

## 2015-05-01 DIAGNOSIS — R52 Pain, unspecified: Secondary | ICD-10-CM

## 2015-05-01 NOTE — Progress Notes (Signed)
*  PRELIMINARY RESULTS* Vascular Ultrasound Right lower extremity venous duplex has been completed.  Preliminary findings: negative for DVT.  Called results to Dr. Fatima Sanger office. Left voice message with results.    Farrel Demark, RDMS, RVT  05/01/2015, 9:56 AM

## 2015-05-03 ENCOUNTER — Encounter: Payer: Self-pay | Admitting: Physical Therapy

## 2015-05-03 ENCOUNTER — Ambulatory Visit: Payer: Medicare PPO | Admitting: Physical Therapy

## 2015-05-03 DIAGNOSIS — R269 Unspecified abnormalities of gait and mobility: Secondary | ICD-10-CM

## 2015-05-03 DIAGNOSIS — R531 Weakness: Secondary | ICD-10-CM | POA: Diagnosis not present

## 2015-05-03 DIAGNOSIS — R2681 Unsteadiness on feet: Secondary | ICD-10-CM | POA: Diagnosis not present

## 2015-05-03 DIAGNOSIS — R293 Abnormal posture: Secondary | ICD-10-CM | POA: Diagnosis not present

## 2015-05-03 NOTE — Therapy (Signed)
[facility] [address] [place], [place], [address] Phone: [phone]   Fax:  [phone]  Physical Therapy Treatment  Patient Details  Name: Diane Richardson MRN: [id number] Date of Birth: December 05, 1950 Referring Provider:  Merri Brunette, MD  Encounter Date: 05/03/2015    05/03/15 1111  PT Visits / Re-Eval  Visit Number 7  Number of Visits 16  Date for PT Re-Evaluation 06/01/15  PT Time Calculation  PT Start Time 1101  PT Stop Time 1141  PT Time Calculation (min) 40 min  PT - End of Session  Equipment Utilized During Treatment Gait belt  Activity Tolerance Patient tolerated treatment well  Behavior During Therapy [facility] for tasks assessed/performed    Past Medical History  Diagnosis Date  . Fibromyalgia   . PTSD (post-traumatic stress disorder)   . Anxiety   . Depression   . DVT (deep venous thrombosis)   . PE (pulmonary embolism)   . ADD (attention deficit disorder)   . Muscle spasm     bad  . Fatigue     chronic    Past Surgical History  Procedure Laterality Date  . Childbirth  1989    x1,NVD  . Pulmonary embolism surgery  1983    x9 days  . Foot surgery Right     pt states she had bunion surgery right foot.    There were no vitals filed for this visit.  Visit Diagnosis:   Generalized weakness   780.79 R53.1 Change Dx 2.  Unsteadiness   781.2 R26.81 Change Dx 3.  Abnormality of gait   781.2 R26.9 Change Dx 4.  Postural instability         Subjective Assessment - 05/03/15 1110    Subjective Started Hulen Luster, Celebrex and Scalaxin yesterday as prescibed by Dr Corliss Skains. Negative for DVT per results in computer. No falls to report.   Currently in Pain? Yes   Pain Score 2    Pain Location Hand   Pain Orientation Right;Left   Pain Descriptors / Indicators Tingling   Pain Type Chronic pain   Pain Onset More than a month ago   Pain Frequency Intermittent   Aggravating Factors  worse in am,  gets better as day progresses with activity   Pain Relieving Factors rest, cymbalta           [facility] Adult PT Treatment/Exercise - 05/03/15 0001    Ambulation/Gait   Ambulation/Gait Yes   Ambulation/Gait Assistance 5: Supervision;6: Modified independent (Device/Increase time)   Ambulation/Gait Assistance Details only a few reminder cues for increased toe clearance with gait.   Ambulation Distance (Feet) 1000 Feet   Assistive device None   Gait Pattern Step-through pattern;Decreased stride length;Narrow base of support;Shuffle  shuffle x 3-4 episodes only   Ambulation Surface Level;Unlevel;Indoor;Outdoor;Paved      Neuro Re-ed: On red mats: with min assist to min guard assist. - high knee marches, toe walking, heel walking, tandem walking. All forward/backward x 3 laps each/each way.  - hurdles of vaired heights, reciprocal stepping over, x 6 laps   -6 cones in a row at the edge of the mats: alternating fwd toe taps with side stepping, alternating fwd double toe taps with side stepping and alternating flipping over/up with side stepping. 1 lap each./each way         PT Short Term Goals - 04/05/15 1100    PT SHORT TERM GOAL #1   Title Patient demonstrates / vrebalizes initial HEP (Target Date: 05/04/2015)  Time 4   Period Weeks   Status New   PT SHORT TERM GOAL #2   Title Berg Balance >40/56  (Target Date: 05/04/2015)   Time 4   Period Weeks   Status New   PT SHORT TERM GOAL #3   Title Functional Gait Assessment >16/30  (Target Date: 05/04/2015)   Time 4   Period Weeks   Status New           PT Long Term Goals - 04/05/15 1100    PT LONG TERM GOAL #1   Title Patient demonstrates / verbalizes ongoing HEP / fitness plan.  (Target Date: 06/01/2015)   Time 8   Period Weeks   Status New   PT LONG TERM GOAL #2   Title Pt will improve Dynamic Gait Index score to at least 19/24 for decreased fall risk.  (Target Date: 06/01/2015)   Time 8   Period Weeks   Status New    PT LONG TERM GOAL #3   Title Pt will improve Berg Balance score to at least 51/56 for decreased fall risk.  (Target Date: 06/01/2015)   Time 8   Period Weeks   Status New   PT LONG TERM GOAL #4   Title Functional Gait Assessment >/= 19/30  (Target Date: 06/01/2015)   Time 8   Period Weeks   Status New   PT LONG TERM GOAL #5   Title Pt will ambulate at least 1000 ft, indoor/outdoor surfaces, independently with no loss of balance, for improved gait in outdoor areas.  (Target Date: 06/01/2015)   Time 8   Period Weeks   Status New        05/03/15 1112  Plan  Clinical Impression Statement Pt making steady progress toward goals.   Pt will benefit from skilled therapeutic intervention in order to improve on the following deficits Abnormal gait;Decreased activity tolerance;Decreased balance;Decreased endurance;Decreased mobility;Decreased range of motion;Decreased strength;Postural dysfunction  Rehab Potential Good  PT Frequency 2x / week  PT Duration 8 weeks  PT Treatment/Interventions ADLs/Self Care Home Management;Gait training;Stair training;Functional mobility training;Therapeutic activities;Therapeutic exercise;Balance training;Patient/family education;Neuromuscular re-education;Vestibular;Orthotic Fit/Training  PT Next Visit Plan assess STGs next session  PT Home Exercise Plan 8/3: current HEP is mostly water exercises and swimming   Consulted and Agree with Plan of Care Patient     Problem List Patient Active Problem List   Diagnosis Date Noted  . Other psychotic disorder not due to substance or known physiological condition 01/17/2015  . Rigors 01/17/2015  . 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 Progressive dementia with uncertain etiology 01/17/2015  . OCD (obsessive compulsive disorder) 10/16/2014  . Delusional disorder 10/16/2014  . Mild neurocognitive disorder 10/10/2014  . Mild benzodiazepine use disorder 10/10/2014  . Anxiety disorder 10/10/2014  . Anxiety 08/29/2014  .  H/O deep venous thrombosis 08/29/2014  . Healed or old pulmonary embolism 08/29/2014  . Paranoia     Sallyanne Kuster 05/03/2015, 11:37 AM  Sallyanne Kuster, PTA, [facility] Outpatient Neuro [facility] [address] [place], [place] [address] [phone] 05/03/2015, 11:37 AM

## 2015-05-07 ENCOUNTER — Encounter: Payer: Self-pay | Admitting: Physical Therapy

## 2015-05-07 ENCOUNTER — Ambulatory Visit: Payer: Medicare PPO | Admitting: Physical Therapy

## 2015-05-07 DIAGNOSIS — R531 Weakness: Secondary | ICD-10-CM | POA: Diagnosis not present

## 2015-05-07 DIAGNOSIS — R269 Unspecified abnormalities of gait and mobility: Secondary | ICD-10-CM

## 2015-05-07 DIAGNOSIS — R293 Abnormal posture: Secondary | ICD-10-CM

## 2015-05-07 DIAGNOSIS — R2681 Unsteadiness on feet: Secondary | ICD-10-CM | POA: Diagnosis not present

## 2015-05-07 NOTE — Therapy (Signed)
[facility] [address] [place], [place], [address] Phone: [phone]   Fax:  [phone]  Physical Therapy Treatment  Patient Details  Name: Diane Richardson MRN: [id number] Date of Birth: [birth year] Referring Provider:  Carol Ada, MD  Encounter Date: 05/07/2015      PT End of Session - 05/07/15 1015    Visit Number 8   Number of Visits 16   Date for PT Re-Evaluation 06/01/15   PT Start Time 1020   PT Stop Time 1100   PT Time Calculation (min) 40 min   Equipment Utilized During Treatment Gait belt   Activity Tolerance Patient tolerated treatment well   Behavior During Therapy [facility] for tasks assessed/performed      Past Medical History  Diagnosis Date  . Fibromyalgia   . PTSD (post-traumatic stress disorder)   . Anxiety   . Depression   . DVT (deep venous thrombosis)   . PE (pulmonary embolism)   . ADD (attention deficit disorder)   . Muscle spasm     bad  . Fatigue     chronic    Past Surgical History  Procedure Laterality Date  . Childbirth  1989    x1,NVD  . Pulmonary embolism surgery  1983    x9 days  . Foot surgery Right     pt states she had bunion surgery right foot.    There were no vitals filed for this visit.  Visit Diagnosis:  Generalized weakness  Unsteadiness  Abnormality of gait  Postural instability      Subjective Assessment - 05/07/15 1032    Subjective No falls. She is doing some work with her horses again.   Currently in Pain? Yes   Pain Score 2    Pain Location Hand   Pain Orientation Right;Left   Pain Descriptors / Indicators Numbness;Tingling;Burning   Pain Type Chronic pain   Pain Onset More than a month ago   Pain Frequency Intermittent   Aggravating Factors  may swim too long   Pain Relieving Factors rest, medications   Multiple Pain Sites No            [facility] PT Assessment - 05/07/15 1015    Berg Balance Test   Sit to Stand Able to stand  without using hands and stabilize independently   Standing Unsupported Able to stand safely 2 minutes   Sitting with Back Unsupported but Feet Supported on Floor or Stool Able to sit safely and securely 2 minutes   Stand to Sit Sits safely with minimal use of hands   Transfers Able to transfer safely, minor use of hands   Standing Unsupported with Eyes Closed Able to stand 10 seconds with supervision   Standing Ubsupported with Feet Together Able to place feet together independently and stand 1 minute safely   From Standing, Reach Forward with Outstretched Arm Can reach forward >12 cm safely (5")   From Standing Position, Pick up Object from Floor Able to pick up shoe safely and easily   From Standing Position, Turn to Look Behind Over each Shoulder Needs supervision when turning   Turn 360 Degrees Able to turn 360 degrees safely but slowly   Standing Unsupported, Alternately Place Feet on Step/Stool Able to complete >2 steps/needs minimal assist   Standing Unsupported, One Foot in Front Able to take small step independently and hold 30 seconds   Standing on One Leg Able to lift leg independently and hold equal to or more  than 3 seconds   Total Score 42   Functional Gait  Assessment   Gait assessed  Yes   Gait Level Surface Walks 20 ft in less than 7 sec but greater than 5.5 sec, uses assistive device, slower speed, mild gait deviations, or deviates 6-10 in outside of the 12 in walkway width.   Change in Gait Speed Able to change speed, demonstrates mild gait deviations, deviates 6-10 in outside of the 12 in walkway width, or no gait deviations, unable to achieve a major change in velocity, or uses a change in velocity, or uses an assistive device.   Gait with Horizontal Head Turns Performs head turns smoothly with slight change in gait velocity (eg, minor disruption to smooth gait path), deviates 6-10 in outside 12 in walkway width, or uses an assistive device.   Gait with Vertical Head Turns  Performs task with slight change in gait velocity (eg, minor disruption to smooth gait path), deviates 6 - 10 in outside 12 in walkway width or uses assistive device   Gait and Pivot Turn Pivot turns safely in greater than 3 sec and stops with no loss of balance, or pivot turns safely within 3 sec and stops with mild imbalance, requires small steps to catch balance.   Step Over Obstacle Is able to step over one shoe box (4.5 in total height) without changing gait speed. No evidence of imbalance.   Gait with Narrow Base of Support Ambulates 4-7 steps.   Gait with Eyes Closed Walks 20 ft, slow speed, abnormal gait pattern, evidence for imbalance, deviates 10-15 in outside 12 in walkway width. Requires more than 9 sec to ambulate 20 ft.   Ambulating Backwards Walks 20 ft, slow speed, abnormal gait pattern, evidence for imbalance, deviates 10-15 in outside 12 in walkway width.   Steps Alternating feet, must use rail.   Total Score 17     Gait Training: Patient ambulates 300' working on scanning environment with head turns side to side, up/down & diagonals with contact assist.                        PT Education - 05/07/15 1015    Education provided Yes   Education Details answered questions about riding her horse with recommendation to initiate walking only with supervision only & limit time to decrease fatigue of LEs, use block & assist to mount & dismount; components of well-rounded fitness program to include flexibility, strentgthening, endurance & balance   Person(s) Educated Patient   Methods Explanation   Comprehension Verbalized understanding          PT Short Term Goals - 05/07/15 1015    PT SHORT TERM GOAL #1   Title Patient demonstrates / vrebalizes initial HEP (Target Date: 05/04/2015)   Time 4   Period Weeks   Status Achieved   PT SHORT TERM GOAL #2   Title Berg Balance >40/56  (Target Date: 05/04/2015)   Baseline MET 05/07/2015   Merrilee Jansky Balance 42/56   Time 4    Period Weeks   Status Achieved   PT SHORT TERM GOAL #3   Title Functional Gait Assessment >16/30  (Target Date: 05/04/2015)   Baseline MET 04/28/2015  FGA 17/30   Time 4   Period Weeks   Status Achieved           PT Long Term Goals - 04/05/15 1100    PT LONG TERM GOAL #1   Title Patient demonstrates /  verbalizes ongoing HEP / fitness plan.  (Target Date: 06/01/2015)   Time 8   Period Weeks   Status New   PT LONG TERM GOAL #2   Title Pt will improve Dynamic Gait Index score to at least 19/24 for decreased fall risk.  (Target Date: 06/01/2015)   Time 8   Period Weeks   Status New   PT LONG TERM GOAL #3   Title Pt will improve Berg Balance score to at least 51/56 for decreased fall risk.  (Target Date: 06/01/2015)   Time 8   Period Weeks   Status New   PT LONG TERM GOAL #4   Title Functional Gait Assessment >/= 19/30  (Target Date: 06/01/2015)   Time 8   Period Weeks   Status New   PT LONG TERM GOAL #5   Title Pt will ambulate at least 1000 ft, indoor/outdoor surfaces, independently with no loss of balance, for improved gait in outdoor areas.  (Target Date: 06/01/2015)   Time 8   Period Weeks   Status New               Plan - 05/07/15 1015    Clinical Impression Statement Patient met all STGs indicating improved balance & gait. However both her Merrilee Jansky Balance of 42/56 and Functional Gait Assessment 17/30 still indicate high fall risk.    Pt will benefit from skilled therapeutic intervention in order to improve on the following deficits Abnormal gait;Decreased activity tolerance;Decreased balance;Decreased endurance;Decreased mobility;Decreased range of motion;Decreased strength;Postural dysfunction   Rehab Potential Good   PT Frequency 2x / week   PT Duration 8 weeks   PT Treatment/Interventions ADLs/Self Care Home Management;Gait training;Stair training;Functional mobility training;Therapeutic activities;Therapeutic exercise;Balance training;Patient/family  education;Neuromuscular re-education;Vestibular;Orthotic Fit/Training   PT Next Visit Plan continue towards LTGs, work on vestibular functions during balance & gait   Consulted and Agree with Plan of Care Patient        Problem List Patient Active Problem List   Diagnosis Date Noted  . Other psychotic disorder not due to substance or known physiological condition 01/17/2015  . Rigors 01/17/2015  . 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 Progressive dementia with uncertain etiology 35/00/9381  . OCD (obsessive compulsive disorder) 10/16/2014  . Delusional disorder 10/16/2014  . Mild neurocognitive disorder 10/10/2014  . Mild benzodiazepine use disorder 10/10/2014  . Anxiety disorder 10/10/2014  . Anxiety 08/29/2014  . H/O deep venous thrombosis 08/29/2014  . Healed or old pulmonary embolism 08/29/2014  . Haywood Pao PT, DPT 05/07/2015, 7:33 PM  [facility] [address] [place], [place], [address] Phone: [phone]   Fax:  [phone]

## 2015-05-08 DIAGNOSIS — F332 Major depressive disorder, recurrent severe without psychotic features: Secondary | ICD-10-CM | POA: Diagnosis not present

## 2015-05-09 ENCOUNTER — Ambulatory Visit: Payer: Medicare PPO | Admitting: Physical Therapy

## 2015-05-09 ENCOUNTER — Encounter: Payer: Self-pay | Admitting: Physical Therapy

## 2015-05-09 DIAGNOSIS — R2681 Unsteadiness on feet: Secondary | ICD-10-CM | POA: Diagnosis not present

## 2015-05-09 DIAGNOSIS — R531 Weakness: Secondary | ICD-10-CM

## 2015-05-09 DIAGNOSIS — R293 Abnormal posture: Secondary | ICD-10-CM | POA: Diagnosis not present

## 2015-05-09 DIAGNOSIS — R269 Unspecified abnormalities of gait and mobility: Secondary | ICD-10-CM | POA: Diagnosis not present

## 2015-05-09 NOTE — Therapy (Signed)
[facility] [address] [place], [place], [address] Phone: [phone]   Fax:  [phone]  Physical Therapy Treatment  Patient Details  Name: Diane Richardson MRN: [id number] Date of Birth: [birth year] Referring Provider:  Carol Ada, MD  Encounter Date: 05/09/2015      PT End of Session - 05/09/15 1015    Visit Number 9   Number of Visits 16   Date for PT Re-Evaluation 06/01/15   PT Start Time 1324   PT Stop Time 1100   PT Time Calculation (min) 45 min   Equipment Utilized During Treatment Gait belt   Activity Tolerance Patient tolerated treatment well   Behavior During Therapy [facility] for tasks assessed/performed      Past Medical History  Diagnosis Date  . Fibromyalgia   . PTSD (post-traumatic stress disorder)   . Anxiety   . Depression   . DVT (deep venous thrombosis)   . PE (pulmonary embolism)   . ADD (attention deficit disorder)   . Muscle spasm     bad  . Fatigue     chronic    Past Surgical History  Procedure Laterality Date  . Childbirth  1989    x1,NVD  . Pulmonary embolism surgery  1983    x9 days  . Foot surgery Right     pt states she had bunion surgery right foot.    There were no vitals filed for this visit.  Visit Diagnosis:  Generalized weakness  Unsteadiness  Abnormality of gait  Postural instability      Subjective Assessment - 05/09/15 1015    Subjective (p) No falls. She has not had a chance yet to try to ride horse with supervision walking only.   Currently in Pain? (p) No/denies                         [facility] Adult PT Treatment/Exercise - 05/09/15 1015    Ambulation/Gait   Ambulation/Gait Yes   Ambulation/Gait Assistance 5: Supervision   Ambulation/Gait Assistance Details worked on ability to initiate gait upon arising without balance loss, scanning while ambulating and direction changes   Ambulation Distance (Feet) 1000 Feet   Assistive  device None   Gait Pattern Step-through pattern;Decreased stride length;Narrow base of support;Shuffle   Ambulation Surface Indoor;Level   High Level Balance   High Level Balance Activities Backward walking;Direction changes;Turns;Sudden stops;Head turns;Negotitating around obstacles;Negotiating over obstacles   High Level Balance Comments PT demo, cued verbally& tactile for coordination of movement including head and UE/LE                  PT Short Term Goals - 05/07/15 1015    PT SHORT TERM GOAL #1   Title Patient demonstrates / vrebalizes initial HEP (Target Date: 05/04/2015)   Time 4   Period Weeks   Status Achieved   PT SHORT TERM GOAL #2   Title Berg Balance >40/56  (Target Date: 05/04/2015)   Baseline MET 05/07/2015   Merrilee Jansky Balance 42/56   Time 4   Period Weeks   Status Achieved   PT SHORT TERM GOAL #3   Title Functional Gait Assessment >16/30  (Target Date: 05/04/2015)   Baseline MET 04/28/2015  FGA 17/30   Time 4   Period Weeks   Status Achieved           PT Long Term Goals - 04/05/15 1100    PT LONG TERM GOAL #1  Title Patient demonstrates / verbalizes ongoing HEP / fitness plan.  (Target Date: 06/01/2015)   Time 8   Period Weeks   Status New   PT LONG TERM GOAL #2   Title Pt will improve Dynamic Gait Index score to at least 19/24 for decreased fall risk.  (Target Date: 06/01/2015)   Time 8   Period Weeks   Status New   PT LONG TERM GOAL #3   Title Pt will improve Berg Balance score to at least 51/56 for decreased fall risk.  (Target Date: 06/01/2015)   Time 8   Period Weeks   Status New   PT LONG TERM GOAL #4   Title Functional Gait Assessment >/= 19/30  (Target Date: 06/01/2015)   Time 8   Period Weeks   Status New   PT LONG TERM GOAL #5   Title Pt will ambulate at least 1000 ft, indoor/outdoor surfaces, independently with no loss of balance, for improved gait in outdoor areas.  (Target Date: 06/01/2015)   Time 8   Period Weeks   Status New                Plan - 05/09/15 1015    Clinical Impression Statement Patient was able to improve ability to change directions with better coordination & balance with instructions & repetitions including tactile cues.   Pt will benefit from skilled therapeutic intervention in order to improve on the following deficits Abnormal gait;Decreased activity tolerance;Decreased balance;Decreased endurance;Decreased mobility;Decreased range of motion;Decreased strength;Postural dysfunction   Rehab Potential Good   PT Frequency 2x / week   PT Duration 8 weeks   PT Treatment/Interventions ADLs/Self Care Home Management;Gait training;Stair training;Functional mobility training;Therapeutic activities;Therapeutic exercise;Balance training;Patient/family education;Neuromuscular re-education;Vestibular;Orthotic Fit/Training   PT Next Visit Plan continue towards LTGs, work on vestibular functions during balance & gait   Consulted and Agree with Plan of Care Patient        Problem List Patient Active Problem List   Diagnosis Date Noted  . Other psychotic disorder not due to substance or known physiological condition 01/17/2015  . Rigors 01/17/2015  . 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 Progressive dementia with uncertain etiology 47/82/9562  . OCD (obsessive compulsive disorder) 10/16/2014  . Delusional disorder 10/16/2014  . Mild neurocognitive disorder 10/10/2014  . Mild benzodiazepine use disorder 10/10/2014  . Anxiety disorder 10/10/2014  . Anxiety 08/29/2014  . H/O deep venous thrombosis 08/29/2014  . Healed or old pulmonary embolism 08/29/2014  . Haywood Pao PT, DPT 05/09/2015, 3:58 PM  [facility] [address] [place], [place], [address] Phone: [phone]   Fax:  [phone]

## 2015-05-10 ENCOUNTER — Ambulatory Visit (INDEPENDENT_AMBULATORY_CARE_PROVIDER_SITE_OTHER): Payer: Medicare PPO | Admitting: Neurology

## 2015-05-10 ENCOUNTER — Encounter (INDEPENDENT_AMBULATORY_CARE_PROVIDER_SITE_OTHER): Payer: Self-pay

## 2015-05-10 ENCOUNTER — Encounter: Payer: Self-pay | Admitting: Neurology

## 2015-05-10 VITALS — BP 82/58 | HR 84 | Resp 20 | Ht 61.0 in | Wt 120.0 lb

## 2015-05-10 DIAGNOSIS — F333 Major depressive disorder, recurrent, severe with psychotic symptoms: Secondary | ICD-10-CM | POA: Diagnosis not present

## 2015-05-10 NOTE — Patient Instructions (Signed)
Driving and Equipment Restrictions are in place until tested for safety / OT    Some medical problems make it dangerous to drive, ride a bike, or use machines. Some of these problems are:  A hard blow to the head (concussion).  Passing out (fainting).  Twitching and shaking (seizures).  Low blood sugar.  Taking medicine to help you relax (sedatives).  Taking pain medicines.  Wearing an eye patch.  Wearing splints. This can make it hard to use parts of your body that you need to drive safely. HOME CARE   Do not drive until your doctor says it is okay. I will let you drive with your husband in the car.   Do not use machines until your doctor says it is okay. You may need a form signed by your doctor (medical release) before you can drive again. You may also need this form before you do other tasks where you need to be fully alert. MAKE SURE YOU:  Understand these instructions.  Will watch your condition.  Will get help right away if you are not doing well or get worse. Document Released: 10/02/2004 Document Revised: 11/17/2011 Document Reviewed: 01/02/2010 [facility] Patient Information 2015 [place], [place]. This information is not intended to replace advice given to you by your health care provider. Make sure you discuss any questions you have with your health care provider.

## 2015-05-10 NOTE — Progress Notes (Signed)
[facility]   Provider:  Larey Seat, M D  Referring Provider: Carol Ada, MD Primary Care Physician:  Reginia Naas, MD  Chief Complaint  Patient presents with  . Follow-up    gait much better, low BP of 82/58, says she is little lightheaded, a little unsteady on feet, rm 11, alone    HPI:  Diane Richardson is a 64 y.o. female , seen here as a revisit  from Dr. Casimiro Needle,   Diane Richardson has significantly recovered since I have seen her last in May of this year. At the time she appeared catatonic, Parkinsonian and unable to maintain a conversation she also had a visible akathisia . We have performed a lumbar puncture to gain spinal fluid for to help protein analysis. Not have any signs of abnormal dementia related protein accumulation. The patient was taken off Abilify July 7 and her cognitive status improved drastically. Her strength has returned sheath happy to swim again she has some fibromyalgia symptoms again. She is less rigid and more limber and her movements.  She had one bad fall in early July in her bathroom. I had spoken to her counselor and recommended that physical therapy to address her  high fall risk would be initiated. She has not needed this finally and by now she is not unstable at all her husband and the patient feels that she has good balance again.  We examined her gait here today and she has a normal step width at step length, she has normal arm swing was movement and turns with 3 steps. There was no corrective step noticed, no dizziness, no leaning.    Review of Systems: Out of a complete 14 system review, the patient complains of only the following symptoms, and all other reviewed systems are negative.    Social History   Social History  . Marital Status: Married    Spouse Name: N/A  . Number of Children: 1  . Years of Education: PHD   Occupational History  . disabled     FORMER TEACHER   Social History Main Topics  . Smoking  status: Never Smoker   . Smokeless tobacco: Never Used  . Alcohol Use: Yes     Comment: 3 glasses of wine weekly  . Drug Use: No  . Sexual Activity: Yes   Other Topics Concern  . Not on file   Social History Narrative   Drinks less than one cup of caffeine daily.    Family History  Problem Relation Age of Onset  . CAD Father   . Hyperlipidemia Father   . Hypertension Father   . Hyperlipidemia Brother   . Diabetes Maternal Grandmother   . Cancer Maternal Grandmother     stomach  . Dementia Maternal Grandfather   . Colon polyps Maternal Grandfather   . CAD Paternal Grandmother   . Diabetes Paternal Grandmother   . Dementia Paternal Grandfather     Past Medical History  Diagnosis Date  . Fibromyalgia   . PTSD (post-traumatic stress disorder)   . Anxiety   . Depression   . DVT (deep venous thrombosis)   . PE (pulmonary embolism)   . ADD (attention deficit disorder)   . Muscle spasm     bad  . Fatigue     chronic    Past Surgical History  Procedure Laterality Date  . Childbirth  1989    x1,NVD  . Pulmonary embolism surgery  1983    x9 days  .  Foot surgery Right     pt states she had bunion surgery right foot.    Current Outpatient Prescriptions  Medication Sig Dispense Refill  . aspirin EC 81 MG tablet Take 1 tablet (81 mg total) by mouth daily.    . celecoxib (CELEBREX) 200 MG capsule Take 200 mg by mouth daily after breakfast.    . Cholecalciferol (VITAMIN D3) 3000 UNITS TABS Take 3,000 Units by mouth daily. For nutrition supplementation    . Coenzyme Q10 (COQ-10) 50 MG CAPS Take 1 capsule by mouth daily. One capsule with a meal for supplementation  0  . LORazepam (ATIVAN) 0.5 MG tablet Take 1 tablet (0.5 mg total) by mouth 2 (two) times daily. (Patient taking differently: Take 2 mg by mouth See admin instructions. ) 30 tablet 0  . metaxalone (SKELAXIN) 800 MG tablet Take 800 mg by mouth at bedtime as needed for muscle spasms.    . pregabalin (LYRICA) 25 MG  capsule Take 50 mg by mouth 3 (three) times daily.    . traZODone (DESYREL) 50 MG tablet Take 50 mg by mouth at bedtime.    . DULoxetine (CYMBALTA) 60 MG capsule Take 120 mg by mouth every morning.  0   No current facility-administered medications for this visit.    Allergies as of 05/10/2015 - Review Complete 05/10/2015  Allergen Reaction Noted  . Codeine Hives and Diarrhea 02/03/2013  . Penicillins Hives and Diarrhea 01/23/2013  . Percocet [oxycodone-acetaminophen] Other (See Comments) 02/03/2013  . Percodan [oxycodone-aspirin] Other (See Comments) 02/03/2013  . Donnatal [belladonna alk-phenobarb er] Rash 02/03/2013    Vitals: BP 82/58 mmHg  Pulse 84  Resp 20  Ht _0  (1.549 m)  Wt 120 lb (54.432 kg)  BMI 22.69 kg/m2  LMP  (LMP Unknown) Last Weight:  Wt Readings from Last 1 Encounters:  05/10/15 120 lb (54.432 kg)   HCW:CBJS mass index is 22.69 kg/(m^2).     Last Height:   Ht Readings from Last 1 Encounters:  05/10/15 _1  (1.549 m)    Physical exam:  General: The patient is awake, alert and appears not in acute distress. The patient is well groomed. Head: Normocephalic, atraumatic. Neck is supple. Mallampati 3, poor dentition. ,    Cardiovascular:  Regular rate and rhythm , without  murmurs or carotid bruit, and without distended neck veins. Respiratory: Lungs are clear to auscultation. Skin:  Without evidence of edema, or rash Trunk:  The patient's posture is erect   Neurologic exam : The patient is awake and alert, oriented to place and time.   Memory subjective  described as intact.  Memory testing revealed :  MOCA: Montreal Cognitive Assessment  10/16/2014  Visuospatial/ Executive (0/5) 4  Naming (0/3) 3  Attention: Read list of digits (0/2) 2  Attention: Read list of letters (0/1) 1  Attention: Serial 7 subtraction starting at 100 (0/3) 3  Language: Repeat phrase (0/2) 2  Language : Fluency (0/1) 1  Abstraction (0/2) 2  Delayed Recall (0/5) 5    Orientation (0/6) 6  Total 29   MMSE: MMSE - Mini Mental State Exam 10/16/2014  Orientation to time 5  Orientation to Place 5  Registration 3  Attention/ Calculation 5  Recall 3  Language- name 2 objects 2  Language- repeat 1  Language- follow 3 step command 3  Language- read & follow direction 1  Write a sentence 1  Copy design 1  Total score 30       Attention span &  concentration ability appears normal.  Speech is fluent,  without  Dysarthria, but mild  dysphonia .  Mood and affect are appropriate.  Cranial nerves: Pupils are equal and briskly reactive to light.. Extraocular movements  in vertical and horizontal planes intact and without nystagmus. Visual fields by finger perimetry are intact. Hearing to finger rub intact.   Facial sensation intact to fine touch.  Facial motor strength is symmetric and tongue and uvula move midline. Shoulder shrug was symmetrical.   Motor exam: Normal tone, muscle bulk and symmetric strength in all extremities.  Sensory:  Fine touch, pinprick and vibration were tested in all extremities.  Proprioception tested in the upper extremities was normal.  Coordination: Rapid alternating movements in the fingers/hands was normal.  Finger-to-nose maneuver  l withevidence of  dysmetria and action tremor. There is no resting tremor mild restlessness. She tends to move her feet and toes.  Gait and station: We examined her gait here today and she has a normal step width at step length, she has normal arm swing was movement and turns with 3 steps. There was no corrective step noticed, no dizziness, no leaning.  Patient walks without assistive device and is able unassisted to climb up to the exam table. Strength within normal limits.  Stance is stable and normal.  Toe and hell stand were tested .Tandem gait is unfragmented. Turns with  3  Steps. Romberg testing is negative.  Deep tendon reflexes: in the  upper and lower extremities are symmetric and  intact. Babinski maneuver response is downgoing.  The patient was advised of the nature of the diagnosed  disorder , the treatment options and risks for general a health and wellness arising from not treating the condition.  I spent more than 25 minutes of face to face time with the patient. Greater than 50% of time was spent in counseling and coordination of care. We have discussed the diagnosis and differential and I answered the patient's questions.    Mrs. 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  She did need treatment for depression but she responded to some of the medications especially the atypical neurolept take Abilify was developing parkinsonian symptoms and catatonia. She had an abnormal EEG, probably under medication influence. Her spinal fluid did not reveal total protein. Her MRI did not show significant brain injury. By history she has multiple concussions or contusions. She has less of the poverty of speech that I observed before is more fluent and a formal has an emotionally wider band -width. She is no longer drooling and has normal facial movements normal I am movements. Her gait instability has resolved. She continues with physical therapy and has made great strides. On today's geriatric depression score she endorsed only to out of 15 points. I also had a Fax report available from [place], dated July 12 in which her last fall was reported to me. Physical therapy begun to work with her and has continued , she saw them last yesterday.   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 Dr  Toy Care to follow.    2)  The patient should undergo a driving evaluation with  OT -PT . Simulated driving .   3)  Rv prn with neurology.       Asencion Partridge Nyx Keady MD  05/10/2015   CC: Carol Ada, [place], [place] [address]   Norma Fredrickson , MD

## 2015-05-15 ENCOUNTER — Ambulatory Visit: Payer: Medicare PPO | Attending: Nurse Practitioner | Admitting: Physical Therapy

## 2015-05-15 ENCOUNTER — Encounter: Payer: Self-pay | Admitting: Physical Therapy

## 2015-05-15 DIAGNOSIS — R531 Weakness: Secondary | ICD-10-CM | POA: Diagnosis not present

## 2015-05-15 DIAGNOSIS — R2681 Unsteadiness on feet: Secondary | ICD-10-CM | POA: Insufficient documentation

## 2015-05-15 DIAGNOSIS — R269 Unspecified abnormalities of gait and mobility: Secondary | ICD-10-CM | POA: Diagnosis not present

## 2015-05-15 DIAGNOSIS — F332 Major depressive disorder, recurrent severe without psychotic features: Secondary | ICD-10-CM | POA: Diagnosis not present

## 2015-05-15 DIAGNOSIS — R293 Abnormal posture: Secondary | ICD-10-CM | POA: Insufficient documentation

## 2015-05-16 NOTE — Therapy (Addendum)
[facility] [address] [place], [place], [address] Phone: [phone]   Fax:  [phone]  Physical Therapy Treatment  Patient Details  Name: Diane Richardson MRN: [id number] Date of Birth: 1950-09-26 Referring Provider:  Carol Ada, MD  Encounter Date: 05/15/2015    Past Medical History  Diagnosis Date  . Fibromyalgia   . PTSD (post-traumatic stress disorder)   . Anxiety   . Depression   . DVT (deep venous thrombosis)   . PE (pulmonary embolism)   . ADD (attention deficit disorder)   . Muscle spasm     bad  . Fatigue     chronic    Past Surgical History  Procedure Laterality Date  . Childbirth  1989    x1,NVD  . Pulmonary embolism surgery  1983    x9 days  . Foot surgery Right     pt states she had bunion surgery right foot.    There were no vitals filed for this visit.  Visit Diagnosis:  Generalized weakness  Unsteadiness  Abnormality of gait  Postural instability      Neuro Re-ed: Gait on track indoors with ball toss to self x 2 laps with min guard assist to supervision.         PT Short Term Goals - 05/07/15 1015    PT SHORT TERM GOAL #1   Title Patient demonstrates / vrebalizes initial HEP (Target Date: 05/04/2015)   Time 4   Period Weeks   Status Achieved   PT SHORT TERM GOAL #2   Title Berg Balance >40/56  (Target Date: 05/04/2015)   Baseline MET 05/07/2015   Merrilee Jansky Balance 42/56   Time 4   Period Weeks   Status Achieved   PT SHORT TERM GOAL #3   Title Functional Gait Assessment >16/30  (Target Date: 05/04/2015)   Baseline MET 04/28/2015  FGA 17/30   Time 4   Period Weeks   Status Achieved           PT Long Term Goals - 05/16/15 1638    PT LONG TERM GOAL #1   Title Patient demonstrates / verbalizes ongoing HEP / fitness plan.  (Target Date: 06/01/2015)   Time 8   Period Weeks   Status New   PT LONG TERM GOAL #2   Title Pt will improve Dynamic Gait Index score to  at least 19/24 for decreased fall risk.  (Target Date: 06/01/2015)   Status Achieved   PT LONG TERM GOAL #3   Title Pt will improve Berg Balance score to at least 51/56 for decreased fall risk.  (Target Date: 06/01/2015)   Status Achieved   PT LONG TERM GOAL #4   Title Functional Gait Assessment >/= 19/30  (Target Date: 06/01/2015)   Time 8   Period Weeks   Status New   PT LONG TERM GOAL #5   Title Pt will ambulate at least 1000 ft, indoor/outdoor surfaces, independently with no loss of balance, for improved gait in outdoor areas.  (Target Date: 06/01/2015)   Status Achieved           Problem List Patient Active Problem List   Diagnosis Date Noted  . Other psychotic disorder not due to substance or known physiological condition 01/17/2015  . Rigors 01/17/2015  . 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 Progressive dementia with uncertain etiology 46/65/9935  . OCD (obsessive compulsive disorder) 10/16/2014  . Delusional disorder 10/16/2014  . Mild neurocognitive disorder 10/10/2014  . Mild benzodiazepine  use disorder 10/10/2014  . Anxiety disorder 10/10/2014  . Anxiety 08/29/2014  . H/O deep venous thrombosis 08/29/2014  . Healed or old pulmonary embolism 08/29/2014  . Paranoia     WALDRON,ROBIN 05/21/2015, 1:46 PM  Willow Ora, PTA, [facility] [address] [place], [place] [address] [phone] 05/21/2015, 1:46 PM     Physical Therapy Progress Note  Dates of Reporting Period: 04/05/2105 to 05/15/2015  Objective Reports of Subjective Statement: Patient reports less falls. She has been able to return to working with her horses a little.   Objective Measurements: Berg Balance 54/56, Dynamic Gait Index 22/24  Goal Update: See above  Plan: See above  Reason Skilled Services are Required: Patient needs skilled instruction to progress balance to highest potential to minimize fall risk.  Jamey Reas, PT, DPT PT Specializing in  [place] 05/21/2015 1:46 PM Phone:  [phone]  Fax:  [phone] [facility] [address] [place], [place] [address]

## 2015-05-17 ENCOUNTER — Ambulatory Visit: Payer: Medicare PPO | Admitting: Physical Therapy

## 2015-05-17 ENCOUNTER — Telehealth: Payer: Self-pay | Admitting: *Deleted

## 2015-05-17 ENCOUNTER — Encounter: Payer: Self-pay | Admitting: Physical Therapy

## 2015-05-17 DIAGNOSIS — R293 Abnormal posture: Secondary | ICD-10-CM | POA: Diagnosis not present

## 2015-05-17 DIAGNOSIS — R531 Weakness: Secondary | ICD-10-CM | POA: Diagnosis not present

## 2015-05-17 DIAGNOSIS — R2681 Unsteadiness on feet: Secondary | ICD-10-CM | POA: Diagnosis not present

## 2015-05-17 DIAGNOSIS — R269 Unspecified abnormalities of gait and mobility: Secondary | ICD-10-CM | POA: Diagnosis not present

## 2015-05-17 NOTE — Telephone Encounter (Signed)
I called pt and let her know that the driving services are done by [facility] Lennox, at [phone].  Pt took down #.  She asked if they did simulation, I told her that I did not know and that Cyndee would be able to answer her questions.

## 2015-05-17 NOTE — Therapy (Signed)
[facility] [address] [place], [place], [address] Phone: [phone]   Fax:  [phone]  Physical Therapy Treatment  Patient Details  Name: Diane Richardson MRN: [id number] Date of Birth: September 03, 1951 Referring Provider:  Carol Ada, MD  Encounter Date: 05/17/2015      PT End of Session - 05/17/15 1028    Visit Number 11   Number of Visits 16   Date for PT Re-Evaluation 06/01/15   PT Start Time 1024   PT Stop Time 1103   PT Time Calculation (min) 39 min   Equipment Utilized During Treatment Gait belt   Activity Tolerance Patient tolerated treatment well   Behavior During Therapy [facility] for tasks assessed/performed      Past Medical History  Diagnosis Date  . Fibromyalgia   . PTSD (post-traumatic stress disorder)   . Anxiety   . Depression   . DVT (deep venous thrombosis)   . PE (pulmonary embolism)   . ADD (attention deficit disorder)   . Muscle spasm     bad  . Fatigue     chronic    Past Surgical History  Procedure Laterality Date  . Childbirth  1989    x1,NVD  . Pulmonary embolism surgery  1983    x9 days  . Foot surgery Right     pt states she had bunion surgery right foot.    There were no vitals filed for this visit.  Visit Diagnosis:  Generalized weakness  Unsteadiness  Abnormality of gait  Postural instability      Subjective Assessment - 05/17/15 1027    Subjective No new falls or complaints to report. Pain is better in her hands, did not wake her up this am like it usually does.   Currently in Pain? Yes   Pain Score 2    Pain Location Hand   Pain Orientation Right;Left   Pain Descriptors / Indicators Tingling;Numbness;Burning   Pain Type Chronic pain   Pain Onset More than a month ago   Pain Frequency Intermittent   Aggravating Factors  increased activity/use of hands   Pain Relieving Factors rest, medications            [facility] Adult PT Treatment/Exercise - 05/17/15  1029    Ambulation/Gait   Ambulation/Gait Yes   Ambulation/Gait Assistance 5: Supervision   Ambulation/Gait Assistance Details pt engaged in conversation, cognitive tasks and enviroment scanning during all gait, occasional cues for foot clearance to decrease toe scuffing.                         Ambulation Distance (Feet) 1000 Feet   Assistive device None   Gait Pattern Step-through pattern;Decreased stride length;Narrow base of support;Shuffle   Ambulation Surface Level;Unlevel;Indoor;Outdoor;Paved;Gravel;Grass      Neuro Re-ed:  inverted BOSU: no UE support on bars to intermittent UE support on bars - rocks fwd/bwd, laterally x 10 each way with min assist and cues on posture/weight shifitng. - mini squats x 10 reps with cues on form - eyes open: head nods and shakes x 10 each way with cues on posture/weight shift for equal weight in stance - eyes closed: head nods and shakes x 10 each way with cues as above with eyes open   Blue foam beam: no UE support unless otherwise stated, up to min assist for balance fwd heel taps, alternating legs x 10 each leg bwd taps, alternating legs, x 10 each leg Side stepping  on beam x 2 laps each way, light UE support on bars and cues on form/posture         PT Short Term Goals - 05/07/15 1015    PT SHORT TERM GOAL #1   Title Patient demonstrates / vrebalizes initial HEP (Target Date: 05/04/2015)   Time 4   Period Weeks   Status Achieved   PT SHORT TERM GOAL #2   Title Berg Balance >40/56  (Target Date: 05/04/2015)   Baseline MET 05/07/2015   Merrilee Jansky Balance 42/56   Time 4   Period Weeks   Status Achieved   PT SHORT TERM GOAL #3   Title Functional Gait Assessment >16/30  (Target Date: 05/04/2015)   Baseline MET 04/28/2015  FGA 17/30   Time 4   Period Weeks   Status Achieved           PT Long Term Goals - 05/16/15 1601    PT LONG TERM GOAL #1   Title Patient demonstrates / verbalizes ongoing HEP / fitness plan.  (Target Date: 06/01/2015)    Time 8   Period Weeks   Status New   PT LONG TERM GOAL #2   Title Pt will improve Dynamic Gait Index score to at least 19/24 for decreased fall risk.  (Target Date: 06/01/2015)   Status Achieved   PT LONG TERM GOAL #3   Title Pt will improve Berg Balance score to at least 51/56 for decreased fall risk.  (Target Date: 06/01/2015)   Status Achieved   PT LONG TERM GOAL #4   Title Functional Gait Assessment >/= 19/30  (Target Date: 06/01/2015)   Time 8   Period Weeks   Status New   PT LONG TERM GOAL #5   Title Pt will ambulate at least 1000 ft, indoor/outdoor surfaces, independently with no loss of balance, for improved gait in outdoor areas.  (Target Date: 06/01/2015)   Status Achieved           Plan - 05/17/15 1028    Clinical Impression Statement Pt making steady progress toward goals.   Pt will benefit from skilled therapeutic intervention in order to improve on the following deficits Abnormal gait;Decreased activity tolerance;Decreased balance;Decreased endurance;Decreased mobility;Decreased range of motion;Decreased strength;Postural dysfunction   Rehab Potential Good   PT Frequency 2x / week   PT Duration 8 weeks   PT Treatment/Interventions ADLs/Self Care Home Management;Gait training;Stair training;Functional mobility training;Therapeutic activities;Therapeutic exercise;Balance training;Patient/family education;Neuromuscular re-education;Vestibular;Orthotic Fit/Training   PT Next Visit Plan continue towards LTGs, work on vestibular functions during balance & gait   Consulted and Agree with Plan of Care Patient        Problem List Patient Active Problem List   Diagnosis Date Noted  . Other psychotic disorder not due to substance or known physiological condition 01/17/2015  . Rigors 01/17/2015  . 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 Progressive dementia with uncertain etiology 09/32/3557  . OCD (obsessive compulsive disorder) 10/16/2014  . Delusional disorder 10/16/2014   . Mild neurocognitive disorder 10/10/2014  . Mild benzodiazepine use disorder 10/10/2014  . Anxiety disorder 10/10/2014  . Anxiety 08/29/2014  . H/O deep venous thrombosis 08/29/2014  . Healed or old pulmonary embolism 08/29/2014  . Paranoia     Willow Ora 05/17/2015, 10:57 PM  Willow Ora, PTA, [facility] [address] [place], [place] [address] [phone] 05/17/2015, 10:57 PM

## 2015-05-22 ENCOUNTER — Ambulatory Visit: Payer: Medicare PPO | Admitting: Physical Therapy

## 2015-05-22 ENCOUNTER — Encounter: Payer: Self-pay | Admitting: Physical Therapy

## 2015-05-22 DIAGNOSIS — R531 Weakness: Secondary | ICD-10-CM

## 2015-05-22 DIAGNOSIS — R293 Abnormal posture: Secondary | ICD-10-CM

## 2015-05-22 DIAGNOSIS — R2681 Unsteadiness on feet: Secondary | ICD-10-CM

## 2015-05-22 DIAGNOSIS — R269 Unspecified abnormalities of gait and mobility: Secondary | ICD-10-CM | POA: Diagnosis not present

## 2015-05-22 NOTE — Patient Instructions (Signed)
Fitness Plan has 4 components.  1. Endurance - Recommend machines that are sitting with back support that uses both your arms and your legs.Or can do walking program Goal is 20-30 minutes. 2. Strength - weight machines enough weight to have resistance but not so much that you have to strain or cheat.  Goal is to do 15 repetitions for 2-3 sets. Rest 30-60 seconds between sets.  Do 2-4 leg machines, 2-4 arm machines & 2-4 trunk machines. Look for pictures to make sure you exercise both sides of arm, leg or trunk.  Leg machines like leg press (can do both legs or each leg by themselves),   At home can do theraband exercises for arms or legs, floor transfers, sit to stand to sit using arms as little as possible, Yoga positions 3.Flexibility - make sure you include arms, legs & trunk. Can do Yoga also 4. Balance- can work in corner with chair in front - head turns, arm motions, eyes closed; place one foot inside cabinet or on 4-6" block to work on one-legged stance,   Try to get to each component 3-5 times per week.  Going to [employer] or fitness center you want do things you can not do at home.  For example use bikes at [employer] if you don't have one at home. Then on days you don't go to [employer], do exercises at home. 

## 2015-05-22 NOTE — Therapy (Signed)
[facility] [address] [place], [place], [address] Phone: [phone]   Fax:  [phone]  Physical Therapy Treatment  Patient Details  Name: Diane Richardson MRN: [id number] Date of Birth: 14-Oct-1950 Referring Provider:  Carol Ada, MD  Encounter Date: 05/22/2015      PT End of Session - 05/22/15 1030    Visit Number 12   Number of Visits 16   Date for PT Re-Evaluation 06/01/15   PT Start Time 1030   PT Stop Time 1100   PT Time Calculation (min) 30 min   Equipment Utilized During Treatment Gait belt   Activity Tolerance Patient tolerated treatment well   Behavior During Therapy [facility] for tasks assessed/performed      Past Medical History  Diagnosis Date  . Fibromyalgia   . PTSD (post-traumatic stress disorder)   . Anxiety   . Depression   . DVT (deep venous thrombosis)   . PE (pulmonary embolism)   . ADD (attention deficit disorder)   . Muscle spasm     bad  . Fatigue     chronic    Past Surgical History  Procedure Laterality Date  . Childbirth  1989    x1,NVD  . Pulmonary embolism surgery  1983    x9 days  . Foot surgery Right     pt states she had bunion surgery right foot.    There were no vitals filed for this visit.  Visit Diagnosis:  Generalized weakness  Unsteadiness  Abnormality of gait  Postural instability      Subjective Assessment - 05/22/15 1031    Subjective No new falls. 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 She also went swimming freestyle anbd backstroke.   Currently in Pain? Yes   Pain Score 2    Pain Location Hand   Pain Orientation Right;Left   Pain Descriptors / Indicators Tingling   Pain Type Chronic pain   Pain Onset More than a month ago   Pain Frequency Intermittent   Aggravating Factors  increased activity / use of hands   Pain Relieving Factors rest, medications   Multiple Pain Sites No       Therapeutic Exercise See patient education. Leg press 45# with PT instructing why 50# & 60# is too much and 30# is too light currently. Seated chest pull 20# with PT cueing control, posture for proper technique for upper body weight machines. Treadmill 1.2mh X 4 min with PT instructing set-up, safety and speed adjustment.                           PT Education - 05/22/15 1030    Education provided Yes   Education Details components of well-rounded HEP / fitness plan, use of weight machines with proper amount of wt selection, types of cardio equipment with recommend treadmill <5 min followed by recumbent stepper using both UEs & LEs   Person(s) Educated Patient   Methods Explanation;Demonstration;Verbal cues;Handout   Comprehension Verbalized understanding;Returned demonstration;Verbal cues required;Need further instruction          PT Short Term Goals - 05/07/15 1015    PT SHORT TERM GOAL #1   Title Patient demonstrates / vrebalizes initial HEP (Target Date: 05/04/2015)   Time 4   Period Weeks   Status Achieved   PT SHORT TERM GOAL #2   Title BMerrilee JanskyBalance >40/56  (Target Date:  05/04/2015)   Baseline MET 05/07/2015   Merrilee Jansky Balance 42/56   Time 4   Period Weeks   Status Achieved   PT SHORT TERM GOAL #3   Title Functional Gait Assessment >16/30  (Target Date: 05/04/2015)   Baseline MET 04/28/2015  FGA 17/30   Time 4   Period Weeks   Status Achieved           PT Long Term Goals - 05/16/15 8182    PT LONG TERM GOAL #1   Title Patient demonstrates / verbalizes ongoing HEP / fitness plan.  (Target Date: 06/01/2015)   Time 8   Period Weeks   Status New   PT LONG TERM GOAL #2   Title Pt will improve Dynamic Gait Index score to at least 19/24 for decreased fall risk.  (Target Date: 06/01/2015)   Status Achieved   PT LONG TERM GOAL #3   Title Pt will improve Berg Balance score to at least 51/56 for decreased fall risk.  (Target Date: 06/01/2015)    Status Achieved   PT LONG TERM GOAL #4   Title Functional Gait Assessment >/= 19/30  (Target Date: 06/01/2015)   Time 8   Period Weeks   Status New   PT LONG TERM GOAL #5   Title Pt will ambulate at least 1000 ft, indoor/outdoor surfaces, independently with no loss of balance, for improved gait in outdoor areas.  (Target Date: 06/01/2015)   Status Achieved               Plan - 05/22/15 1030    Clinical Impression Statement Patient has better understanding of components of fitness plan. She appears to understand how to determine amount of weight for weight machines.    Pt will benefit from skilled therapeutic intervention in order to improve on the following deficits Abnormal gait;Decreased activity tolerance;Decreased balance;Decreased endurance;Decreased mobility;Decreased range of motion;Decreased strength;Postural dysfunction   Rehab Potential Good   PT Frequency 2x / week   PT Duration 8 weeks   PT Treatment/Interventions ADLs/Self Care Home Management;Gait training;Stair training;Functional mobility training;Therapeutic activities;Therapeutic exercise;Balance training;Patient/family education;Neuromuscular re-education;Vestibular;Orthotic Fit/Training   PT Next Visit Plan continue towards LTGs, work on vestibular functions during balance & gait   Consulted and Agree with Plan of Care Patient        Problem List Patient Active Problem List   Diagnosis Date Noted  . Other psychotic disorder not due to substance or known physiological condition 01/17/2015  . Rigors 01/17/2015  . 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 Progressive dementia with uncertain etiology 99/37/1696  . OCD (obsessive compulsive disorder) 10/16/2014  . Delusional disorder 10/16/2014  . Mild neurocognitive disorder 10/10/2014  . Mild benzodiazepine use disorder 10/10/2014  . Anxiety disorder 10/10/2014  . Anxiety 08/29/2014  . H/O deep venous thrombosis 08/29/2014  . Healed or old pulmonary embolism  08/29/2014  . Haywood Pao PT, DPT 05/22/2015, 5:35 PM  [facility] [address] [place], [place], [address] Phone: [phone]   Fax:  [phone]

## 2015-05-24 ENCOUNTER — Encounter: Payer: Self-pay | Admitting: Physical Therapy

## 2015-05-24 ENCOUNTER — Ambulatory Visit: Payer: Medicare PPO | Admitting: Physical Therapy

## 2015-05-24 DIAGNOSIS — R269 Unspecified abnormalities of gait and mobility: Secondary | ICD-10-CM | POA: Diagnosis not present

## 2015-05-24 DIAGNOSIS — R2681 Unsteadiness on feet: Secondary | ICD-10-CM

## 2015-05-24 DIAGNOSIS — R293 Abnormal posture: Secondary | ICD-10-CM | POA: Diagnosis not present

## 2015-05-24 DIAGNOSIS — R531 Weakness: Secondary | ICD-10-CM

## 2015-05-25 NOTE — Therapy (Signed)
[facility] [address] [place], [place], [address] Phone: [phone]   Fax:  [phone]  Physical Therapy Treatment  Patient Details  Name: Diane Richardson MRN: [id number] Date of Birth: 02-Nov-1950 Referring Provider:  Carol Ada, MD  Encounter Date: 05/24/2015      PT End of Session - 05/24/15 1015    Visit Number 13   Number of Visits 16   Date for PT Re-Evaluation 06/01/15   PT Start Time 1020   PT Stop Time 1100   PT Time Calculation (min) 40 min   Equipment Utilized During Treatment Gait belt   Activity Tolerance Patient tolerated treatment well   Behavior During Therapy [facility] for tasks assessed/performed      Past Medical History  Diagnosis Date  . Fibromyalgia   . PTSD (post-traumatic stress disorder)   . Anxiety   . Depression   . DVT (deep venous thrombosis)   . PE (pulmonary embolism)   . ADD (attention deficit disorder)   . Muscle spasm     bad  . Fatigue     chronic    Past Surgical History  Procedure Laterality Date  . Childbirth  1989    x1,NVD  . Pulmonary embolism surgery  1983    x9 days  . Foot surgery Right     pt states she had bunion surgery right foot.    There were no vitals filed for this visit.  Visit Diagnosis:  Generalized weakness  Unsteadiness  Abnormality of gait  Postural instability      Subjective Assessment - 05/24/15 1015    Subjective No new falls. She swam yesterday freestyle, backstroke, breaststroke and inverted breaststroke.   Currently in Pain? Yes   Pain Score 1    Pain Location Hand   Pain Orientation Left;Right   Pain Descriptors / Indicators Tingling   Pain Type Chronic pain   Pain Onset More than a month ago   Pain Frequency Intermittent                         [facility] Adult PT Treatment/Exercise - 05/24/15 0930    Ambulation/Gait   Ambulation/Gait Yes   Ambulation/Gait Assistance 5: Supervision   Ambulation/Gait Assistance Details PT verbal & tactile cues for balance, pace & path with head turns while walking on grass   Ambulation Distance (Feet) 1000 Feet   Assistive device None   Gait Pattern Step-through pattern;Decreased stride length;Narrow base of support;Shuffle   Ambulation Surface Indoor;Level;Outdoor;Gravel;Grass   Stairs Yes   Stairs Assistance 5: Supervision   Stairs Assistance Details (indicate cue type and reason) cues on wt shift and timing knee extension / flexion   Stair Management Technique Two rails;Alternating pattern;Forwards   Number of Stairs 4  5 reps   Ramp 5: Supervision   Ramp Details (indicate cue type and reason) cues on posture   Curb 5: Supervision   Curb Details (indicate cue type and reason) cues on stepping thru to complete motion   Balance   Balance Assessed Yes   High Level Balance   High Level Balance Activities Side stepping;Braiding;Backward walking;Direction changes;Head turns;Marching forwards;Marching backwards;Weight-shifting turns  on grass with contact assist / tactile cues at pelvis   High Level Balance Comments tactile & verbal cues at pelvis for balance reaction /coordination    Neuro Re-ed    Neuro Re-ed Details  alternate stepping concrete to grass & rubber mulch with 2-3" ht difference  tactile & verbal cues for balance                  PT Short Term Goals - 05/07/15 1015    PT SHORT TERM GOAL #1   Title Patient demonstrates / vrebalizes initial HEP (Target Date: 05/04/2015)   Time 4   Period Weeks   Status Achieved   PT SHORT TERM GOAL #2   Title Berg Balance >40/56  (Target Date: 05/04/2015)   Baseline MET 05/07/2015   Merrilee Jansky Balance 42/56   Time 4   Period Weeks   Status Achieved   PT SHORT TERM GOAL #3   Title Functional Gait Assessment >16/30  (Target Date: 05/04/2015)   Baseline MET 04/28/2015  FGA 17/30   Time 4   Period Weeks   Status Achieved           PT Long Term Goals - 05/16/15 3143    PT  LONG TERM GOAL #1   Title Patient demonstrates / verbalizes ongoing HEP / fitness plan.  (Target Date: 06/01/2015)   Time 8   Period Weeks   Status New   PT LONG TERM GOAL #2   Title Pt will improve Dynamic Gait Index score to at least 19/24 for decreased fall risk.  (Target Date: 06/01/2015)   Status Achieved   PT LONG TERM GOAL #3   Title Pt will improve Berg Balance score to at least 51/56 for decreased fall risk.  (Target Date: 06/01/2015)   Status Achieved   PT LONG TERM GOAL #4   Title Functional Gait Assessment >/= 19/30  (Target Date: 06/01/2015)   Time 8   Period Weeks   Status New   PT LONG TERM GOAL #5   Title Pt will ambulate at least 1000 ft, indoor/outdoor surfaces, independently with no loss of balance, for improved gait in outdoor areas.  (Target Date: 06/01/2015)   Status Achieved               Plan - 05/24/15 1015    Clinical Impression Statement Patient improved balance reactions with advanced skills on grass / uneven terrain. Patient needs incrased repetition to learn new motor skills. Patient appears on target to meet LTGs next week & discharge.   Pt will benefit from skilled therapeutic intervention in order to improve on the following deficits Abnormal gait;Decreased activity tolerance;Decreased balance;Decreased endurance;Decreased mobility;Decreased range of motion;Decreased strength;Postural dysfunction   Rehab Potential Good   PT Frequency 2x / week   PT Duration 8 weeks   PT Treatment/Interventions ADLs/Self Care Home Management;Gait training;Stair training;Functional mobility training;Therapeutic activities;Therapeutic exercise;Balance training;Patient/family education;Neuromuscular re-education;Vestibular;Orthotic Fit/Training   PT Next Visit Plan assess LTGs, finalize details on HEP / ftness plan, FOTO   Consulted and Agree with Plan of Care Patient        Problem List Patient Active Problem List   Diagnosis Date Noted  . Other psychotic  disorder not due to substance or known physiological condition 01/17/2015  . Rigors 01/17/2015  . 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 Progressive dementia with uncertain etiology 88/87/5797  . OCD (obsessive compulsive disorder) 10/16/2014  . Delusional disorder 10/16/2014  . Mild neurocognitive disorder 10/10/2014  . Mild benzodiazepine use disorder 10/10/2014  . Anxiety disorder 10/10/2014  . Anxiety 08/29/2014  . H/O deep venous thrombosis 08/29/2014  . Healed or old pulmonary embolism 08/29/2014  . Haywood Pao PT, DPT 05/25/2015, 12:32 PM  [facility] [address] [place],  [place], [address] Phone: [phone]   Fax:  [phone]

## 2015-05-29 ENCOUNTER — Encounter: Payer: Self-pay | Admitting: Physical Therapy

## 2015-05-29 ENCOUNTER — Ambulatory Visit: Payer: Medicare PPO | Admitting: Physical Therapy

## 2015-05-29 DIAGNOSIS — R531 Weakness: Secondary | ICD-10-CM | POA: Diagnosis not present

## 2015-05-29 DIAGNOSIS — R293 Abnormal posture: Secondary | ICD-10-CM

## 2015-05-29 DIAGNOSIS — R269 Unspecified abnormalities of gait and mobility: Secondary | ICD-10-CM

## 2015-05-29 DIAGNOSIS — R2681 Unsteadiness on feet: Secondary | ICD-10-CM

## 2015-05-29 NOTE — Therapy (Signed)
[facility] [address] [place], [place], [address] Phone: [phone]   Fax:  [phone]  Physical Therapy Treatment  Patient Details  Name: Diane Richardson MRN: [id number] Date of Birth: 1951-02-06 Referring Provider:  Carol Ada, MD  Encounter Date: 05/29/2015      PT End of Session - 05/29/15 0945    Visit Number 14   Number of Visits 16   Date for PT Re-Evaluation 06/01/15   PT Start Time 0935   PT Stop Time 1015   PT Time Calculation (min) 40 min   Equipment Utilized During Treatment Gait belt   Activity Tolerance Patient tolerated treatment well   Behavior During Therapy [facility] for tasks assessed/performed      Past Medical History  Diagnosis Date  . Fibromyalgia   . PTSD (post-traumatic stress disorder)   . Anxiety   . Depression   . DVT (deep venous thrombosis)   . PE (pulmonary embolism)   . ADD (attention deficit disorder)   . Muscle spasm     bad  . Fatigue     chronic    Past Surgical History  Procedure Laterality Date  . Childbirth  1989    x1,NVD  . Pulmonary embolism surgery  1983    x9 days  . Foot surgery Right     pt states she had bunion surgery right foot.    There were no vitals filed for this visit.  Visit Diagnosis:  Generalized weakness  Unsteadiness  Abnormality of gait  Postural instability      Subjective Assessment - 05/29/15 0943    Subjective No new complaints. Has been riding horses again with no issues other than being sore. No falls.    Currently in Pain? Yes   Pain Score 4    Pain Location Hand   Pain Orientation Right;Left   Pain Descriptors / Indicators Burning;Tightness;Numbness   Pain Type Chronic pain   Pain Onset More than a month ago   Pain Frequency Intermittent   Aggravating Factors  unsure, worse in am when she wakes up   Pain Relieving Factors medications, stretching, movements throughout day           [facility] PT Assessment -  05/29/15 1001    Functional Gait  Assessment   Gait assessed  Yes   Gait Level Surface Walks 20 ft in less than 5.5 sec, no assistive devices, good speed, no evidence for imbalance, normal gait pattern, deviates no more than 6 in outside of the 12 in walkway width.   Change in Gait Speed Able to smoothly change walking speed without loss of balance or gait deviation. Deviate no more than 6 in outside of the 12 in walkway width.   Gait with Horizontal Head Turns Performs head turns smoothly with no change in gait. Deviates no more than 6 in outside 12 in walkway width   Gait with Vertical Head Turns Performs head turns with no change in gait. Deviates no more than 6 in outside 12 in walkway width.   Gait and Pivot Turn Pivot turns safely in greater than 3 sec and stops with no loss of balance, or pivot turns safely within 3 sec and stops with mild imbalance, requires small steps to catch balance.   Step Over Obstacle Is able to step over 2 stacked shoe boxes taped together (9 in total height) without changing gait speed. No evidence of imbalance.   Gait with Narrow Base of Support Is able  to ambulate for 10 steps heel to toe with no staggering.   Gait with Eyes Closed Walks 20 ft, no assistive devices, good speed, no evidence of imbalance, normal gait pattern, deviates no more than 6 in outside 12 in walkway width. Ambulates 20 ft in less than 7 sec.   Ambulating Backwards Walks 20 ft, no assistive devices, good speed, no evidence for imbalance, normal gait   Steps Alternating feet, no rail.   Total Score 29           [facility] Adult PT Treatment/Exercise - 05/29/15 0001    Ambulation/Gait   Ambulation/Gait Yes   Ambulation/Gait Assistance 5: Supervision;6: Modified independent (Device/Increase time)   Ambulation/Gait Assistance Details occasional foot scuff noted on uneven pavement. no loss of balance or unsteadiness noted. pt with good gait speed throughout.    Ambulation Distance (Feet) 1000  Feet   Assistive device None   Gait Pattern Step-through pattern;Decreased stride length;Narrow base of support;Shuffle   Ambulation Surface Level;Indoor;Unlevel;Outdoor;Paved;Gravel;Grass   Ramp 6: Modified independent (Device)  no device on outdoor ramps   Curb 6: Modified independent (Device/increase time)  no device on outdoor curbs   [employer]   Sit to Stand Able to stand without using hands and stabilize independently   Standing Unsupported Able to stand safely 2 minutes   Sitting with Back Unsupported but Feet Supported on Floor or Stool Able to sit safely and securely 2 minutes   Stand to Sit Sits safely with minimal use of hands   Transfers Able to transfer safely, minor use of hands   Standing Unsupported with Eyes Closed Able to stand 10 seconds safely   Standing Ubsupported with Feet Together Able to place feet together independently and stand 1 minute safely   From Standing, Reach Forward with Outstretched Arm Can reach confidently >25 cm (10")   From Standing Position, Pick up Object from Floor Able to pick up shoe safely and easily   From Standing Position, Turn to Look Behind Over each Shoulder Looks behind from both sides and weight shifts well   Turn 360 Degrees Able to turn 360 degrees safely in 4 seconds or less  <3 seconds both ways   Standing Unsupported, Alternately Place Feet on Step/Stool Able to stand independently and safely and complete 8 steps in 20 seconds  in 8.60 seconds   Standing Unsupported, One Foot in Front Able to place foot tandem independently and hold 30 seconds   Standing on One Leg Able to lift leg independently and hold > 10 seconds   Total Score 56   Dynamic Gait Index   Level Surface Normal   Change in Gait Speed Normal   Gait with Horizontal Head Turns Normal   Gait with Vertical Head Turns Normal   Gait and Pivot Turn Mild Impairment  4 seconds to turn and stop   Step Over Obstacle Normal   Step Around Obstacles Normal   Steps  Normal   Total Score 23           PT Short Term Goals - 05/07/15 1015    PT SHORT TERM GOAL #1   Title Patient demonstrates / vrebalizes initial HEP (Target Date: 05/04/2015)   Time 4   Period Weeks   Status Achieved   PT SHORT TERM GOAL #2   Title Berg Balance >40/56  (Target Date: 05/04/2015)   Baseline MET 05/07/2015   Merrilee Jansky Balance 42/56   Time 4   Period Weeks   Status Achieved  PT SHORT TERM GOAL #3   Title Functional Gait Assessment >16/30  (Target Date: 05/04/2015)   Baseline MET 04/28/2015  FGA 17/30   Time 4   Period Weeks   Status Achieved           PT Long Term Goals - 05/29/15 0946    PT LONG TERM GOAL #1   Title Patient demonstrates / verbalizes ongoing HEP / fitness plan.  (Target Date: 06/01/2015)   Status On-going   PT LONG TERM GOAL #2   Title Pt will improve Dynamic Gait Index score to at least 19/24 for decreased fall risk.  (Target Date: 06/01/2015)   Baseline 05/29/15: pt scored 23/24 today.   Status Achieved   PT LONG TERM GOAL #3   Title Pt will improve Berg Balance score to at least 51/56 for decreased fall risk.  (Target Date: 06/01/2015)   Baseline 05/29/15: pt scored 56/56 today.   Status Achieved   PT LONG TERM GOAL #4   Title Functional Gait Assessment >/= 19/30  (Target Date: 06/01/2015)   Baseline 05/29/15: pt scored 29/30 today.   Time --   Period --   Status Achieved   PT LONG TERM GOAL #5   Title Pt will ambulate at least 1000 ft, indoor/outdoor surfaces, independently with no loss of balance, for improved gait in outdoor areas.  (Target Date: 06/01/2015)   Status Achieved   PT LONG TERM GOAL #6   Title goal from previous episode, not active wtih this episode of care   Status Deferred           Plan - 05/29/15 0945    Pt will benefit from skilled therapeutic intervention in order to improve on the following deficits Abnormal gait;Decreased activity tolerance;Decreased balance;Decreased endurance;Decreased mobility;Decreased range of  motion;Decreased strength;Postural dysfunction   Rehab Potential Good   PT Frequency 2x / week   PT Duration 8 weeks   PT Treatment/Interventions ADLs/Self Care Home Management;Gait training;Stair training;Functional mobility training;Therapeutic activities;Therapeutic exercise;Balance training;Patient/family education;Neuromuscular re-education;Vestibular;Orthotic Fit/Training   Consulted and Agree with Plan of Care Patient        Problem List Patient Active Problem List   Diagnosis Date Noted  . Other psychotic disorder not due to substance or known physiological condition 01/17/2015  . Rigors 01/17/2015  . 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 Progressive dementia with uncertain etiology 72/62/0355  . OCD (obsessive compulsive disorder) 10/16/2014  . Delusional disorder 10/16/2014  . Mild neurocognitive disorder 10/10/2014  . Mild benzodiazepine use disorder 10/10/2014  . Anxiety disorder 10/10/2014  . Anxiety 08/29/2014  . H/O deep venous thrombosis 08/29/2014  . Healed or old pulmonary embolism 08/29/2014  . Paranoia     Willow Ora 05/29/2015, 4:37 PM  Willow Ora, PTA, [facility] [address] [place], [place] [address] [phone] 05/29/2015, 4:37 PM

## 2015-05-30 ENCOUNTER — Encounter: Payer: Self-pay | Admitting: Physical Therapy

## 2015-05-30 ENCOUNTER — Ambulatory Visit: Payer: Medicare PPO | Admitting: Physical Therapy

## 2015-05-30 DIAGNOSIS — R293 Abnormal posture: Secondary | ICD-10-CM | POA: Diagnosis not present

## 2015-05-30 DIAGNOSIS — R269 Unspecified abnormalities of gait and mobility: Secondary | ICD-10-CM

## 2015-05-30 DIAGNOSIS — R2681 Unsteadiness on feet: Secondary | ICD-10-CM

## 2015-05-30 DIAGNOSIS — R531 Weakness: Secondary | ICD-10-CM

## 2015-05-30 NOTE — Therapy (Signed)
[facility] [address] [place], [place], [address] Phone: [phone]   Fax:  [phone]  Physical Therapy Treatment  Patient Details  Name: Diane Richardson MRN: [id number] Date of Birth: 01/25/1951 Referring Provider:  Carol Ada, MD  Encounter Date: 05/30/2015      PT End of Session - 05/30/15 0930    Visit Number 15   Number of Visits 16   Date for PT Re-Evaluation 06/01/15   PT Start Time 0935   PT Stop Time 1015   PT Time Calculation (min) 40 min   Equipment Utilized During Treatment Gait belt   Activity Tolerance Patient tolerated treatment well   Behavior During Therapy [facility] for tasks assessed/performed      Past Medical History  Diagnosis Date  . Fibromyalgia   . PTSD (post-traumatic stress disorder)   . Anxiety   . Depression   . DVT (deep venous thrombosis)   . PE (pulmonary embolism)   . ADD (attention deficit disorder)   . Muscle spasm     bad  . Fatigue     chronic    Past Surgical History  Procedure Laterality Date  . Childbirth  1989    x1,NVD  . Pulmonary embolism surgery  1983    x9 days  . Foot surgery Right     pt states she had bunion surgery right foot.    There were no vitals filed for this visit.  Visit Diagnosis:  Generalized weakness  Unsteadiness  Abnormality of gait      Subjective Assessment - 05/30/15 0930    Subjective Patient had her caregiver / aide attend session. Patient reports she rode her horse and her right foot and calf hurt.   Currently in Pain? Yes   Pain Score 3    Pain Location Hand   Pain Orientation Right;Left   Pain Descriptors / Indicators Burning;Tightness;Numbness   Pain Type Chronic pain   Pain Onset More than a month ago   Pain Frequency Intermittent   Multiple Pain Sites No     Therapeutic Exercise: PT instructed pt & caregiver in how ankle & foot tightness would cause discomfort especially in weight bearing activities like  riding a horse.  Best practice is low intensity version of activity first to warm muscle up and stretching ~10 minutes later & /or after the activity. PT demonstrated & instructed in seated ankle DF stretch with knee ext & flex, rolling frozen water bottle or firm ball under foot, standing heelcord stretch with knee ext and flexed, heelcord stretch at steps, standing rolling ball under foot, great toe extension. Patient return demonstration & verbalized understanding.                            PT Education - 05/30/15 0930    Education provided Yes   Education Details stretches to ankle & foot, continuing fitness program with 4 components of endurance, strength, flexiblity and balance being addressed at least 3 days per week   Person(s) Educated Patient;Caregiver(s)   Methods Explanation;Demonstration;Tactile cues;Verbal cues   Comprehension Verbalized understanding;Returned demonstration;Verbal cues required;Tactile cues required          PT Short Term Goals - 05/07/15 1015    PT SHORT TERM GOAL #1   Title Patient demonstrates / vrebalizes initial HEP (Target Date: 05/04/2015)   Time 4   Period Weeks   Status Achieved   PT SHORT TERM GOAL #2  Title Berg Balance >40/56  (Target Date: 05/04/2015)   Baseline MET 05/07/2015   Merrilee Jansky Balance 42/56   Time 4   Period Weeks   Status Achieved   PT SHORT TERM GOAL #3   Title Functional Gait Assessment >16/30  (Target Date: 05/04/2015)   Baseline MET 04/28/2015  FGA 17/30   Time 4   Period Weeks   Status Achieved           PT Long Term Goals - 2015/06/23 0930    PT LONG TERM GOAL #1   Title Patient demonstrates / verbalizes ongoing HEP / fitness plan.  (Target Date: 06/01/2015)   Baseline MET 2015-06-23   Status Achieved   PT LONG TERM GOAL #2   Title Pt will improve Dynamic Gait Index score to at least 19/24 for decreased fall risk.  (Target Date: 06/01/2015)   Baseline 05/29/15: pt scored 23/24 today.   Status  Achieved   PT LONG TERM GOAL #3   Title Pt will improve Berg Balance score to at least 51/56 for decreased fall risk.  (Target Date: 06/01/2015)   Baseline 05/29/15: pt scored 56/56 today.   Status Achieved   PT LONG TERM GOAL #4   Title Functional Gait Assessment >/= 19/30  (Target Date: 06/01/2015)   Baseline 05/29/15: pt scored 29/30 today.   Status Achieved   PT LONG TERM GOAL #5   Title Pt will ambulate at least 1000 ft, indoor/outdoor surfaces, independently with no loss of balance, for improved gait in outdoor areas.  (Target Date: 06/01/2015)   Status Achieved               Plan - 2015-06-23 0930    Clinical Impression Statement Patient met all LTGs. She appears to understand how stretching ankle & foot should help discomfort felt with riding her horse. Patient has minimal fall risk now as noted by Dynamic Gait Index 23/24 and Berg Balance 56/56.    Pt will benefit from skilled therapeutic intervention in order to improve on the following deficits Abnormal gait;Decreased activity tolerance;Decreased balance;Decreased endurance;Decreased mobility;Decreased range of motion;Decreased strength;Postural dysfunction   Rehab Potential Good   PT Frequency 2x / week   PT Duration 8 weeks   PT Treatment/Interventions ADLs/Self Care Home Management;Gait training;Stair training;Functional mobility training;Therapeutic activities;Therapeutic exercise;Balance training;Patient/family education;Neuromuscular re-education;Vestibular;Orthotic Fit/Training   PT Next Visit Plan Discharge today   Consulted and Agree with Plan of Care Patient;Family member/caregiver          G-Codes - June 23, 2015 0930    Functional Assessment Tool Used Berg Balance 56/56; Dynamic Gait Index 23/24   Functional Limitation Mobility: Walking and moving around   Mobility: Walking and Moving Around Goal Status [phone]) At least 1 percent but less than 20 percent impaired, limited or restricted   Mobility: Walking and Moving  Around Discharge Status [phone]) At least 1 percent but less than 20 percent impaired, limited or restricted      Problem List Patient Active Problem List   Diagnosis Date Noted  . Other psychotic disorder not due to substance or known physiological condition 01/17/2015  . Rigors 01/17/2015  . 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 Progressive dementia with uncertain etiology 32/95/1884  . OCD (obsessive compulsive disorder) 10/16/2014  . Delusional disorder 10/16/2014  . Mild neurocognitive disorder 10/10/2014  . Mild benzodiazepine use disorder 10/10/2014  . Anxiety disorder 10/10/2014  . Anxiety 08/29/2014  . H/O deep venous thrombosis 08/29/2014  . Healed or old pulmonary embolism 08/29/2014  . Paranoia  PHYSICAL THERAPY DISCHARGE SUMMARY  Visits from Start of Care: 15  Current functional level related to goals / functional outcomes: See above   Remaining deficits: See above   Education / Equipment: HEP / fitness plan.  Plan: Patient agrees to discharge.  Patient goals were met. Patient is being discharged due to meeting the stated rehab goals.  ?????       WALDRON,ROBIN PT, DPT 05/30/2015, 7:55 PM  [facility] [address] [place], [place], [address] Phone: [phone]   Fax:  [phone]

## 2015-06-12 DIAGNOSIS — F332 Major depressive disorder, recurrent severe without psychotic features: Secondary | ICD-10-CM | POA: Diagnosis not present

## 2015-06-27 ENCOUNTER — Telehealth: Payer: Self-pay | Admitting: *Deleted

## 2015-06-27 NOTE — Telephone Encounter (Signed)
Called pt to discuss the pain in her hands. Dr. Vickey Hugerohmeier does not mention in her notes anything regarding pt's having pain in her hands, nor prescribed the pt any medications. Pt needs to discuss with PCP or her rhematologist for her fibromyalgia.  No answer, left a message asking her to call me back.

## 2015-06-28 NOTE — Telephone Encounter (Signed)
Spoke to pt. She reports that she is having pain in her hands and is wondering if she thinks it may be related to her fibromyalgia and wanted to know if she should make an appt with Dr. Corliss Skainseveshwar or Dr. Vickey Hugerohmeier for this. I advised her that Dr. Corliss Skainseveshwar is the one that follows her fibromyalgia so to make an appt with her. I advised her that if Dr. Corliss Skainseveshwar thinks the pain in her hands is not fibromyalgia, but a neurological problem, to please call us and we will make her an appt here. Pt verbalized understanding.

## 2015-06-29 DIAGNOSIS — M797 Fibromyalgia: Secondary | ICD-10-CM | POA: Diagnosis not present

## 2015-06-29 DIAGNOSIS — R200 Anesthesia of skin: Secondary | ICD-10-CM | POA: Diagnosis not present

## 2015-06-29 DIAGNOSIS — G5601 Carpal tunnel syndrome, right upper limb: Secondary | ICD-10-CM | POA: Diagnosis not present

## 2015-06-29 DIAGNOSIS — G5602 Carpal tunnel syndrome, left upper limb: Secondary | ICD-10-CM | POA: Diagnosis not present

## 2015-07-10 ENCOUNTER — Telehealth: Payer: Self-pay

## 2015-07-10 DIAGNOSIS — F332 Major depressive disorder, recurrent severe without psychotic features: Secondary | ICD-10-CM | POA: Diagnosis not present

## 2015-07-10 NOTE — Telephone Encounter (Signed)
Received assessment from pt's driving evaluation, which states that the pt should not drive. "She demonstrated impaired cognitive processing and is not safe to drive at this time due to the functional challenges brought on by this impairment. Ms. Irving CopasFinn demonstrated at risk driving behaviors and should not drive until her cognitive skills improve."  I relayed this to pt's husband. He states that he was told by the person that conducted the test that pt needed to practice driving, and this cannot be achieved if pt cannot drive at all.  Mr. Irving CopasFinn is wanted a call back from Dr. Vickey Hugerohmeier to discuss this. He can be reached at [phone].

## 2015-07-11 NOTE — Telephone Encounter (Signed)
Absolutely not on public roads- And I cannot prescribe driving classes.

## 2015-07-11 NOTE — Telephone Encounter (Signed)
Called pt's husband to advise him that Dr. Vickey Hugerohmeier does not advise pt to practice on public roads.  No answer, left a message asking him to call us back.

## 2015-07-13 DIAGNOSIS — R202 Paresthesia of skin: Secondary | ICD-10-CM | POA: Diagnosis not present

## 2015-07-18 DIAGNOSIS — L700 Acne vulgaris: Secondary | ICD-10-CM | POA: Diagnosis not present

## 2015-08-08 DIAGNOSIS — R202 Paresthesia of skin: Secondary | ICD-10-CM | POA: Diagnosis not present

## 2015-08-08 DIAGNOSIS — G5601 Carpal tunnel syndrome, right upper limb: Secondary | ICD-10-CM | POA: Diagnosis not present

## 2015-08-08 DIAGNOSIS — Z79899 Other long term (current) drug therapy: Secondary | ICD-10-CM | POA: Diagnosis not present

## 2015-08-08 DIAGNOSIS — R5381 Other malaise: Secondary | ICD-10-CM | POA: Diagnosis not present

## 2015-08-21 DIAGNOSIS — F332 Major depressive disorder, recurrent severe without psychotic features: Secondary | ICD-10-CM | POA: Diagnosis not present

## 2015-08-23 DIAGNOSIS — G5601 Carpal tunnel syndrome, right upper limb: Secondary | ICD-10-CM | POA: Diagnosis not present

## 2015-08-27 DIAGNOSIS — F0632 Mood disorder due to known physiological condition with major depressive-like episode: Secondary | ICD-10-CM | POA: Diagnosis not present

## 2015-11-06 ENCOUNTER — Other Ambulatory Visit (HOSPITAL_COMMUNITY)
Admission: RE | Admit: 2015-11-06 | Discharge: 2015-11-06 | Disposition: A | Discharge status: Home / Self Care | Payer: Medicare Other | Source: Ambulatory Visit | Attending: Family Medicine | Admitting: Family Medicine

## 2015-11-06 ENCOUNTER — Other Ambulatory Visit: Payer: Self-pay | Admitting: Family Medicine

## 2015-11-06 DIAGNOSIS — Z124 Encounter for screening for malignant neoplasm of cervix: Secondary | ICD-10-CM | POA: Diagnosis present

## 2015-11-08 LAB — CYTOLOGY - PAP

## 2016-01-18 ENCOUNTER — Other Ambulatory Visit (HOSPITAL_COMMUNITY): Payer: Self-pay | Admitting: Psychiatry

## 2016-03-25 ENCOUNTER — Telehealth: Payer: Self-pay | Admitting: Neurology

## 2016-03-25 NOTE — Telephone Encounter (Signed)
[name] [phone] called, "needs status of driving record" (not listed on DPR)

## 2016-03-25 NOTE — Telephone Encounter (Signed)
I spoke to pt. I advised her that I cannot release any medical information to anyone that is not listed on her DPR, including Genella RifeKatherine Rock, caregiver. Pt verbalized understanding. I also advised her that she would need to contact [facility] to get her driving evaluation. We cannot provide this information for her either. Pt verbalized understanding.  Pt says that she wants a driving evaluation again because she wants her driver's license back but does not want to have another driving evaluation. I made an appt with pt for 04/24/16 and pt will call us back if this appt does not work and reschedule.

## 2016-04-24 ENCOUNTER — Ambulatory Visit (INDEPENDENT_AMBULATORY_CARE_PROVIDER_SITE_OTHER): Payer: BLUE CROSS/BLUE SHIELD | Admitting: Neurology

## 2016-04-24 ENCOUNTER — Encounter: Payer: Self-pay | Admitting: Neurology

## 2016-04-24 VITALS — BP 120/72 | HR 76 | Resp 20 | Ht 61.0 in | Wt 134.0 lb

## 2016-04-24 DIAGNOSIS — F329 Major depressive disorder, single episode, unspecified: Secondary | ICD-10-CM

## 2016-04-24 DIAGNOSIS — S0990XA Unspecified injury of head, initial encounter: Principal | ICD-10-CM

## 2016-04-24 NOTE — Progress Notes (Signed)
[facility]   Provider:  Larey Seat, M D  Referring Provider: Carol Ada, MD Primary Care Physician:  Reginia Naas, MD  Chief Complaint  Patient presents with  . Follow-up    discuss driving restrictions    HPI:  Diane Richardson is a 65 y.o. female , seen here as a revisit   Diane Richardson has significantly recovered since I have seen her last in September 2016, and by May 2016 she appeared catatonic, Parkinsonian and unable to maintain a conversation -she also had a visible akathisia.  She is here today to be able to return to driving and for [facility]. It has been very expansive for her to keep a driver and daytime attended with her. Her recovery is complete.       CD/ September 2016 : We performed a lumbar puncture to gain spinal fluid for to help protein analysis. Not have any signs of abnormal dementia related protein accumulation. The patient was taken off Abilify July 7 and her cognitive status improved drastically. Her strength has returned sheath happy to swim again she has some fibromyalgia symptoms again. She is less rigid and more limber and her movements.  She had one bad fall in early July in her bathroom. I had spoken to her counselor and recommended that physical therapy to address her  high fall risk would be initiated. She has not needed this finally and by now she is not unstable at all her husband and the patient feels that she has good balance again. We examined her gait here today and she has a normal step width at step length, she has normal arm swing was movement and turns with 3 steps. There was no corrective step noticed, no dizziness, no leaning.    Review of Systems: Out of a complete 14 system review, the patient complains of only the following symptoms, and all other reviewed systems are negative.    Social History   Social History  . Marital status: Married    Spouse name: N/A  . Number of children: 1  . Years of education: PHD     Occupational History  . disabled     FORMER TEACHER   Social History Main Topics  . Smoking status: Never Smoker  . Smokeless tobacco: Never Used  . Alcohol use Yes     Comment: 3 glasses of wine weekly  . Drug use: No  . Sexual activity: Yes   Other Topics Concern  . Not on file   Social History Narrative   Drinks less than one cup of caffeine daily.    Family History  Problem Relation Age of Onset  . CAD Father   . Hyperlipidemia Father   . Hypertension Father   . Hyperlipidemia Brother   . Diabetes Maternal Grandmother   . Cancer Maternal Grandmother     stomach  . Dementia Maternal Grandfather   . Colon polyps Maternal Grandfather   . CAD Paternal Grandmother   . Diabetes Paternal Grandmother   . Dementia Paternal Grandfather     Past Medical History:  Diagnosis Date  . ADD (attention deficit disorder)   . Anxiety   . Depression   . DVT (deep venous thrombosis)   . Fatigue    chronic  . Fibromyalgia   . Muscle spasm    bad  . PE (pulmonary embolism)   . PTSD (post-traumatic stress disorder)     Past Surgical History:  Procedure Laterality Date  . childbirth  1989   x1,NVD  . FOOT SURGERY Right    pt states she had bunion surgery right foot.  [facility] PULMONARY EMBOLISM SURGERY  1983   x9 days    Current Outpatient Prescriptions  Medication Sig Dispense Refill  . aspirin EC 81 MG tablet Take 1 tablet (81 mg total) by mouth daily.    . celecoxib (CELEBREX) 200 MG capsule Take 200 mg by mouth daily as needed.     . Cholecalciferol (VITAMIN D3) 3000 UNITS TABS Take 3,000 Units by mouth daily. For nutrition supplementation    . Coenzyme Q10 (COQ-10) 50 MG CAPS Take 1 capsule by mouth daily. One capsule with a meal for supplementation  0  . DULoxetine (CYMBALTA) 60 MG capsule Take 120 mg by mouth every morning.  0  . LORazepam (ATIVAN) 0.5 MG tablet Take 1 tablet (0.5 mg total) by mouth 2 (two) times daily. (Patient taking differently: Take 2 mg by mouth  See admin instructions. ) 30 tablet 0  . metaxalone (SKELAXIN) 800 MG tablet Take 800 mg by mouth at bedtime as needed for muscle spasms.    . pregabalin (LYRICA) 25 MG capsule Take 50 mg by mouth daily as needed.     . traZODone (DESYREL) 50 MG tablet Take 50 mg by mouth at bedtime.     No current facility-administered medications for this visit.     Allergies as of 04/24/2016 - Review Complete 04/24/2016  Allergen Reaction Noted  . Codeine Hives and Diarrhea 02/03/2013  . Penicillins Hives and Diarrhea 01/23/2013  . Percocet [oxycodone-acetaminophen] Other (See Comments) 02/03/2013  . Percodan [oxycodone-aspirin] Other (See Comments) 02/03/2013  . Donnatal [belladonna alk-phenobarb er] Rash 02/03/2013    Vitals: BP 120/72   Pulse 76   Wt 134 lb (60.8 kg)   LMP  (LMP Unknown)   BMI 25.32 kg/m  Last Weight:  Wt Readings from Last 1 Encounters:  04/24/16 134 lb (60.8 kg)   EXB:MWUX mass index is 25.32 kg/m.     Last Height:   Ht Readings from Last 1 Encounters:  05/10/15 5' 1"  (1.549 m)    Physical exam:  General: The patient is awake, alert and appears not in acute distress. The patient is well groomed. Head: Normocephalic, atraumatic. Neck is supple. Mallampati 3, poor dentition. ,    Cardiovascular:  Regular rate and rhythm , without  murmurs or carotid bruit, and without distended neck veins. Respiratory: Lungs are clear to auscultation. Skin:  Without evidence of edema, or rash Trunk:  The patient's posture is erect   Neurologic exam : The patient is awake and alert, oriented to place and time.   Memory subjective  described as intact.  Memory testing revealed : Montreal Cognitive Assessment  04/24/2016 10/16/2014  Visuospatial/ Executive (0/5) 5 4  Naming (0/3) 3 3  Attention: Read list of digits (0/2) 2 2  Attention: Read list of letters (0/1) 1 1  Attention: Serial 7 subtraction starting at 100 (0/3) 2 3  Language: Repeat phrase (0/2) 2 2  Language : Fluency  (0/1) 1 1  Abstraction (0/2) 2 2  Delayed Recall (0/5) 4 5  Orientation (0/6) 6 6  Total 28 29     MMSE: 30-30  MMSE - Mini Mental State Exam 10/16/2014  Orientation to time 5  Orientation to Place 5  Registration 3  Attention/ Calculation 5  Recall 3  Language- name 2 objects 2  Language- repeat 1  Language- follow 3 step command 3  Language-  read & follow direction 1  Write a sentence 1  Copy design 1  Total score 30     Attention span & concentration ability appears normal.  Speech is fluent,  without  dysarthria, but mild  dysphonia .  Mood and affect are appropriate. No longer masked face, normal mimik.   Cranial nerves: Pupils are equal and briskly reactive to light.. Extraocular movements  in vertical and horizontal planes intact and without nystagmus. Visual fields by finger perimetry are intact. Hearing to finger rub intact. Facial sensation intact to fine touch.Facial motor strength is symmetric and tongue and uvula move midline. Shoulder shrug was symmetrical.   Motor exam: Normal tone, muscle bulk and symmetric strength in all extremities. Sensory:  Fine touch, pinprick and vibration were tested in all extremities.  Proprioception tested in the upper extremities was normal.  Coordination: Rapid alternating movements in the fingers/hands was normal.  Finger-to-nose maneuver  l withevidence of  dysmetria and action tremor. There is no resting tremor mild restlessness. She tends to move her feet and toes.  Gait and station: We examined her gait here today and she has a normal step width at step length, she has normal arm swing was movement and turns with 3 steps. There was no corrective step noticed, no dizziness, no leaning.  Patient walks without assistive device and is able unassisted to climb up to the exam table. Strength within normal limits.  Stance is stable and normal.  Tandem gait is unfragmented. Turns with 3 Steps. Romberg testing is negative. Her gait  shows no drift .   Deep tendon reflexes: in the  upper and lower extremities are symmetric and intact. Babinski maneuver response is downgoing.  The patient was advised of the nature of the diagnosed  disorder , the treatment options and risks for general a health and wellness arising from not treating the condition.  I spent more than 35 minutes of face to face time with the patient. Greater than 50% of time was spent in counseling and coordination of care. We have discussed the diagnosis and differential and I answered the patient's questions.    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   She did need treatment for depression but she responded to some of the medications,  especially the atypical neuroleptica" Abilify" by developing catatonia.  Her MRI did not show significant brain injury. By history, she has multiple concussions or contusions. She is here unaccompanied by her spouse and answers all questions promptly.   She has less of the poverty of speech that I observed before is more fluent and has an "emotionally wider band -width". She is no longer drooling and has normal facial movements normal I am movements. Her gait instability has resolved. She continues with physical therapy and has made great strides.  Physical therapy : patient has completed the course. She is hiking now.   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 Psychiatry to follow . Geriatric depression score was 4/15 .  2)  The patient can return to driving ! No restrictions on driving. MOCA 28-30, normal for age and educational back ground,  good trail making  ability.   3)  Rv prn with neurology.       Diane Partridge Kylena Mole MD  04/24/2016   CC: Carol Ada, [place], [place] [address]   Norma Fredrickson , MD

## 2016-04-24 NOTE — Patient Instructions (Signed)
  To whom it may concern,   Mrs. Diane Richardson is able to return to unrestricted driving.her cognitive testing revealed no abnormalities.   [name], MD   04-24-2016

## 2016-06-14 ENCOUNTER — Other Ambulatory Visit: Payer: Self-pay | Admitting: Radiology

## 2016-06-14 DIAGNOSIS — Z79899 Other long term (current) drug therapy: Secondary | ICD-10-CM

## 2016-11-11 ENCOUNTER — Ambulatory Visit
Admission: RE | Admit: 2016-11-11 | Discharge: 2016-11-11 | Disposition: A | Discharge status: Home / Self Care | Payer: Medicare Other | Source: Ambulatory Visit | Attending: Family Medicine | Admitting: Family Medicine

## 2016-11-11 ENCOUNTER — Other Ambulatory Visit: Payer: Self-pay | Admitting: Family Medicine

## 2016-11-11 DIAGNOSIS — R1032 Left lower quadrant pain: Secondary | ICD-10-CM

## 2016-11-11 MED ORDER — IOPAMIDOL (ISOVUE-300) INJECTION 61%
100.0000 mL | Freq: Once | INTRAVENOUS | Status: AC | PRN
  Administered 2016-11-11: 100 mL via INTRAVENOUS

## 2016-11-12 ENCOUNTER — Other Ambulatory Visit: Payer: Self-pay | Admitting: Family Medicine

## 2016-11-28 ENCOUNTER — Other Ambulatory Visit: Payer: Self-pay | Admitting: *Deleted

## 2016-11-28 MED ORDER — CELECOXIB 200 MG PO CAPS: 200.0000 mg | ORAL_CAPSULE | Freq: Every day | ORAL | 0 refills | Status: DC

## 2016-11-28 NOTE — Telephone Encounter (Signed)
Refill request received via   Last visit: 04/29/16 Next Visit due February 2018. Message sent to the front to schedule Labs: 04/30/16 WNL Left message for patient about updating labs.   Okay to refill 30 day supply Celebrex?

## 2016-11-29 ENCOUNTER — Emergency Department (HOSPITAL_COMMUNITY)
Admission: EM | Admit: 2016-11-29 | Discharge: 2016-11-29 | Disposition: A | Discharge status: Home / Self Care | Payer: Medicare Other | Attending: Emergency Medicine | Admitting: Emergency Medicine

## 2016-11-29 ENCOUNTER — Encounter (HOSPITAL_COMMUNITY): Payer: Self-pay | Admitting: Emergency Medicine

## 2016-11-29 DIAGNOSIS — Y939 Activity, unspecified: Secondary | ICD-10-CM | POA: Diagnosis not present

## 2016-11-29 DIAGNOSIS — F909 Attention-deficit hyperactivity disorder, unspecified type: Secondary | ICD-10-CM | POA: Insufficient documentation

## 2016-11-29 DIAGNOSIS — Z7982 Long term (current) use of aspirin: Secondary | ICD-10-CM | POA: Diagnosis not present

## 2016-11-29 DIAGNOSIS — Y999 Unspecified external cause status: Secondary | ICD-10-CM | POA: Insufficient documentation

## 2016-11-29 DIAGNOSIS — Y929 Unspecified place or not applicable: Secondary | ICD-10-CM | POA: Diagnosis not present

## 2016-11-29 DIAGNOSIS — S0501XA Injury of conjunctiva and corneal abrasion without foreign body, right eye, initial encounter: Secondary | ICD-10-CM | POA: Diagnosis not present

## 2016-11-29 DIAGNOSIS — X58XXXA Exposure to other specified factors, initial encounter: Secondary | ICD-10-CM | POA: Insufficient documentation

## 2016-11-29 DIAGNOSIS — S0591XA Unspecified injury of right eye and orbit, initial encounter: Secondary | ICD-10-CM | POA: Diagnosis present

## 2016-11-29 MED ORDER — FLUORESCEIN SODIUM 0.6 MG OP STRP
1.0000 | ORAL_STRIP | Freq: Once | OPHTHALMIC | Status: AC
  Administered 2016-11-29: 1 via OPHTHALMIC
  Filled 2016-11-29: qty 1

## 2016-11-29 MED ORDER — HYDROCODONE-ACETAMINOPHEN 5-325 MG PO TABS
1.0000 | ORAL_TABLET | Freq: Once | ORAL | Status: AC
  Administered 2016-11-29: 1 via ORAL
  Filled 2016-11-29: qty 1

## 2016-11-29 MED ORDER — TOBRAMYCIN 0.3 % OP SOLN: 2.0000 [drp] | Freq: Four times a day (QID) | OPHTHALMIC | 0 refills | Status: AC

## 2016-11-29 NOTE — ED Triage Notes (Signed)
Pt. Stated, Im not sure if part of my contact or something in my right eye, since 300pm.. Its painful

## 2016-11-29 NOTE — Discharge Instructions (Signed)
Please read and follow all provided instructions.  Your diagnoses today include:  1. Abrasion of right cornea, initial encounter     Tests performed today include: Visual acuity testing to check your vision Fluorescein dye examination to look for scratches on your eye Vital signs. See below for your results today.   Medications prescribed:   Take any prescribed medications only as directed.  Home care instructions:  Follow any educational materials contained in this packet.  You have a scratch of the eye on the cornea (the clear part of the eye). This condition may be caused by trauma. It is a common problem for people who wear contact lenses. Proper treatment is important. No evidence of infection is noted today, but you could develop an infection called a corneal ulcer or have some retained foreign body that may or may not have been noted today in the Emergency Department. Ulcers are not only painful, but they may also scar the cornea and cause permanent damage to the eye.   If you wear contact lenses, do not use them until your eye caregiver approves. Follow-up care is necessary to be sure the corneal abrasion is healing if not completely resolved in 2-3 days. See your caregiver or eye specialist as suggested for followup.   Follow-up instructions: Please follow-up with the opthalmologist listed in the next 2-3 days for further evaluation of your symptoms.   Return instructions:  Please return to the Emergency Department if you experience worsening symptoms.  Please return immediately if you develop severe pain, pus drainage, new change in vision, or fever. Please return if you have any other emergent concerns.  Additional Information:  Your vital signs today were: BP (!) 132/59 (BP Location: Left Arm)    Pulse 89    Temp 98.7 F (37.1 C) (Oral)    Resp 20    LMP  (LMP Unknown)    SpO2 100%  If your blood pressure (BP) was elevated above 135/85 this visit, please have this  repeated by your doctor within one month.

## 2016-11-29 NOTE — ED Provider Notes (Signed)
[facility] DEPT Provider Note   CSN: [id number] Arrival date & time: 11/29/16  1824   By signing my name below, I, Eunice Blase, attest that this documentation has been prepared under the direction and in the presence of Shary Decamp, PA-C. Electronically Signed: Eunice Blase, Scribe. 11/29/16. 8:15 PM.   History   Chief Complaint Chief Complaint  Patient presents with  . Eye Problem   The history is provided by the patient and medical records. No language interpreter was used.    HPI Comments: Diane Richardson is a 66 y.o. female who presents to the Emergency Department complaining of worsening R eye pain since taking out her contact today ~3:00 PM. Pt reports associated blurred vision. She notes she recently got a new contact prescription and expresses concern that she may have over-worn them. Pt states it feels like there is a foreign body stuck in her R eye when she closes it. She states she takes Cymbalta regularly. She notes she has taken Celebrex and goodies powder without relief. Pt denies known trauma to the eye.  Past Medical History:  Diagnosis Date  . ADD (attention deficit disorder)   . Anxiety   . Depression   . DVT (deep venous thrombosis) ([place])   . Fatigue    chronic  . Fibromyalgia   . Muscle spasm    bad  . PE (pulmonary embolism)   . PTSD (post-traumatic stress disorder)     Patient Active Problem List   Diagnosis Date Noted  . Other psychotic disorder not due to substance or known physiological condition 01/17/2015  . Rigors 01/17/2015  . 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 Progressive dementia with uncertain etiology 53/61/4431  . OCD (obsessive compulsive disorder) 10/16/2014  . Delusional disorder ([place]) 10/16/2014  . Mild neurocognitive disorder 10/10/2014  . Mild benzodiazepine use disorder 10/10/2014  . Anxiety disorder 10/10/2014  . Anxiety 08/29/2014  . H/O deep venous thrombosis 08/29/2014  . Healed or old pulmonary embolism 08/29/2014    . Paranoia [facility])     Past Surgical History:  Procedure Laterality Date  . childbirth  1989   x1,NVD  . FOOT SURGERY Right    pt states she had bunion surgery right foot.  Marland Kitchen PULMONARY EMBOLISM SURGERY  1983   x9 days    OB History    No data available       Home Medications    Prior to Admission medications   Medication Sig Start Date End Date Taking? Authorizing Provider  aspirin EC 81 MG tablet Take 1 tablet (81 mg total) by mouth daily. 10/12/14   Kerrie Buffalo, NP  celecoxib (CELEBREX) 200 MG capsule Take 200 mg by mouth daily as needed.     Bo Merino, MD  celecoxib (CELEBREX) 200 MG capsule Take 1 capsule (200 mg total) by mouth daily. 11/28/16   Naitik Panwala, PA-C  Cholecalciferol (VITAMIN D3) 3000 UNITS TABS Take 3,000 Units by mouth daily. For nutrition supplementation 10/12/14   Kerrie Buffalo, NP  Coenzyme Q10 (COQ-10) 50 MG CAPS Take 1 capsule by mouth daily. One capsule with a meal for supplementation 10/12/14   Kerrie Buffalo, NP  DULoxetine (CYMBALTA) 60 MG capsule Take 120 mg by mouth every morning. 04/11/15   Historical Provider, MD  LORazepam (ATIVAN) 0.5 MG tablet Take 1 tablet (0.5 mg total) by mouth 2 (two) times daily. Patient taking differently: Take 2 mg by mouth See admin instructions.  10/12/14   Kerrie Buffalo, NP  metaxalone (SKELAXIN) 800 MG tablet  Take 800 mg by mouth at bedtime as needed for muscle spasms.    Bo Merino, MD  pregabalin (LYRICA) 25 MG capsule Take 50 mg by mouth daily as needed.     Bo Merino, MD  traZODone (DESYREL) 50 MG tablet Take 50 mg by mouth at bedtime.    Historical Provider, MD    Family History Family History  Problem Relation Age of Onset  . CAD Father   . Hyperlipidemia Father   . Hypertension Father   . Diabetes Maternal Grandmother   . Cancer Maternal Grandmother     stomach  . Hyperlipidemia Brother   . Dementia Maternal Grandfather   . Colon polyps Maternal Grandfather   . CAD Paternal  Grandmother   . Diabetes Paternal Grandmother   . Dementia Paternal Grandfather     Social History Social History  Substance Use Topics  . Smoking status: Never Smoker  . Smokeless tobacco: Never Used  . Alcohol use Yes     Comment: 3 glasses of wine weekly     Allergies   Codeine; Penicillins; Percocet [oxycodone-acetaminophen]; Percodan [oxycodone-aspirin]; and Donnatal [belladonna alk-phenobarb er]   Review of Systems Review of Systems  Eyes: Positive for pain, discharge and visual disturbance. Negative for photophobia.  Skin: Negative for wound.  Neurological: Negative for dizziness and light-headedness.  Psychiatric/Behavioral: Negative for confusion.     Physical Exam Updated Vital Signs BP (!) 132/59 (BP Location: Left Arm)   Pulse 89   Temp 98.7 F (37.1 C) (Oral)   Resp 20   LMP  (LMP Unknown)   SpO2 100%   Physical Exam  Constitutional: She is oriented to person, place, and time. Vital signs are normal. She appears well-developed and well-nourished.  HENT:  Head: Normocephalic and atraumatic.  Right Ear: Hearing normal.  Left Ear: Hearing normal.  Eyes: Conjunctivae and EOM are normal. Pupils are equal, round, and reactive to light. Right eye exhibits no discharge. Left eye exhibits no discharge.  Sclera injected; producing lots of tears. No foreign body or contact noted. Positive fluro uptake. Contact lens shaped abrasion noted. Relief with tetracaine. No proptosis. No orbital swelling or edema. No erythema. No pain with EOM after marcaine. Pupils equal and reactive.   Neck: Normal range of motion. No JVD present. No tracheal deviation present.  Cardiovascular: Normal rate and regular rhythm.   Pulmonary/Chest: Effort normal. No stridor.  Neurological: She is alert and oriented to person, place, and time. Coordination normal.  Skin: Skin is warm and dry.  Psychiatric: She has a normal mood and affect. Her speech is normal and behavior is normal. Judgment  and thought content normal.  Nursing note and vitals reviewed.  ED Treatments / Results  DIAGNOSTIC STUDIES: Oxygen Saturation is 100% on RA, normal by my interpretation.    COORDINATION OF CARE: 8:15 PM Discussed treatment plan with pt at bedside and pt agreed to plan. Pt prepared for fluoro exam.  Labs (all labs ordered are listed, but only abnormal results are displayed) Labs Reviewed - No data to display  EKG  EKG Interpretation None       Radiology No results found.  Procedures Procedures (including critical care time)  Medications Ordered in ED Medications - No data to display   Initial Impression / Assessment and Plan / ED Course  I have reviewed the triage vital signs and the nursing notes.  Pertinent labs & imaging results that were available during my care of the patient were reviewed by me and  considered in my medical decision making (see chart for details).  Final Clinical Impressions(s) / ED Diagnoses       {I obtained HPI from historian.   ED Course:  Assessment: Pt with Corneal abrasion due to contact lens wear noted via fluro exam. Slept with them in her eye overnight. No foreign bodies noted on exam. No surrounding erythema, swelling, vision changes/loss suspicious for orbital or periorbital cellulitis. No signs of iritis. No signs of glaucoma, intraocular pressures normal. No symptoms of retinal detachment. No ophthalmologic emergency suspected. Outpatient referral given in case of no improvement.    Disposition/Plan:  DC Home Additional Verbal discharge instructions given and discussed with patient.  Pt Instructed to f/u with optho in the next week for evaluation and treatment of symptoms. Return precautions given Pt acknowledges and agrees with plan  Supervising Physician Nat Christen, MD  Final diagnoses:  Abrasion of right cornea, initial encounter    New Prescriptions New Prescriptions   No medications on file   I personally performed  the services described in this documentation, which was scribed in my presence. The recorded information has been reviewed and is accurate.    Shary Decamp, PA-C 11/29/16 2040    Nat Christen, MD 11/30/16 [phone]

## 2016-11-29 NOTE — ED Notes (Signed)
Pt stable, ambulatory, states understanding of discharge instructions 

## 2017-02-20 ENCOUNTER — Other Ambulatory Visit: Payer: Self-pay | Admitting: Rheumatology

## 2017-02-23 NOTE — Telephone Encounter (Signed)
Refill request received via   Last visit: 04/29/16 Next Visit due February 2018. Message sent to the front to schedule Labs: 04/30/16 WNL Left message for patient about updating labs.   Okay to refill 30 day supply Celebrex?

## 2017-02-23 NOTE — Telephone Encounter (Signed)
After this refill, Cannot refill again until office visit

## 2017-02-26 NOTE — Progress Notes (Signed)
Office Visit Note  Patient: Diane Richardson             Date of Birth: February 08, 1951           MRN: [id number]             PCP: Carol Ada, MD Referring: Carol Ada, MD Visit Date: 02/27/2017 Occupation: @GUAROCC @    Subjective:  Fatigue.   History of Present Illness: Diane Richardson is a 66 y.o. female with history of fibromyalgia syndrome. She continues to have some generalized pain and discomfort. She states she fell in January while there was a still so on the ground. She's been still having some residual left hip pain from that. She's tapered of some of the medication she was given in the past and her alertness and tremors are better. She describes her pain on scale of 0-10 about 8 which is coming from her left hip. She rates her fatigue at 4-6 on the scale of 0-10.  Activities of Daily Living:  Patient reports morning stiffness for 1 hour.   Patient Reports nocturnal pain.  Difficulty dressing/grooming: Denies Difficulty climbing stairs: Reports Difficulty getting out of chair: Reports Difficulty using hands for taps, buttons, cutlery, and/or writing: Denies   Review of Systems  Constitutional: Positive for fatigue. Negative for night sweats, weight gain, weight loss and weakness.  HENT: Negative for mouth sores, trouble swallowing, trouble swallowing, mouth dryness and nose dryness.   Eyes: Negative for pain, redness, visual disturbance and dryness.  Respiratory: Negative for cough, shortness of breath and difficulty breathing.   Cardiovascular: Negative for chest pain, palpitations, hypertension, irregular heartbeat and swelling in legs/feet.  Gastrointestinal: Negative for blood in stool, constipation and diarrhea.  Endocrine: Negative for increased urination.  Genitourinary: Negative for vaginal dryness.  Musculoskeletal: Positive for arthralgias, joint pain, myalgias, morning stiffness and myalgias. Negative for joint swelling, muscle weakness and muscle  tenderness.  Skin: Negative for color change, rash, hair loss, skin tightness, ulcers and sensitivity to sunlight.  Allergic/Immunologic: Negative for susceptible to infections.  Neurological: Negative for dizziness, memory loss and night sweats.  Hematological: Negative for swollen glands.  Psychiatric/Behavioral: Positive for depressed mood and sleep disturbance. The patient is nervous/anxious.     PMFS History:  Patient Active Problem List   Diagnosis Date Noted  . Fibromyalgia 02/27/2017  . Primary insomnia 02/27/2017  . Chronic midline low back pain with left-sided sciatica 02/27/2017  . Long term current use of non-steroidal anti-inflammatories (NSAID) 02/27/2017  . History of psychotic reaction/ history of OCD / history of depression  02/27/2017  . Other psychotic disorder not due to substance or known physiological condition 01/17/2015  . Rigors 01/17/2015  . 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 Progressive dementia with uncertain etiology 82/99/3716  . OCD (obsessive compulsive disorder) 10/16/2014  . Delusional disorder ([place]) 10/16/2014  . Mild neurocognitive disorder 10/10/2014  . Mild benzodiazepine use disorder 10/10/2014  . Anxiety disorder 10/10/2014  . Anxiety 08/29/2014  . H/O deep venous thrombosis 08/29/2014  . Healed or old pulmonary embolism 08/29/2014  . Paranoia [facility])     Past Medical History:  Diagnosis Date  . ADD (attention deficit disorder)   . Anxiety   . Depression   . DVT (deep venous thrombosis) ([place])   . Fatigue    chronic  . Fibromyalgia   . Muscle spasm    bad  . PE (pulmonary embolism)   . PTSD (post-traumatic stress disorder)     Family History  Problem  Relation Age of Onset  . CAD Father   . Hyperlipidemia Father   . Hypertension Father   . Diabetes Maternal Grandmother   . Cancer Maternal Grandmother        stomach  . Hyperlipidemia Brother   . Dementia Maternal Grandfather   . Colon polyps Maternal Grandfather   . CAD  Paternal Grandmother   . Diabetes Paternal Grandmother   . Dementia Paternal Grandfather    Past Surgical History:  Procedure Laterality Date  . childbirth  1989   x1,NVD  . FOOT SURGERY Right    pt states she had bunion surgery right foot.  Marland Kitchen PULMONARY EMBOLISM SURGERY  1983   x9 days   Social History   Social History Narrative   Drinks less than one cup of caffeine daily.     Objective: Vital Signs: BP (!) 97/58   Pulse 69   Resp 14   Ht 5' 1"  (1.549 m)   Wt 136 lb (61.7 kg)   LMP  (LMP Unknown)   BMI 25.70 kg/m    Physical Exam  Constitutional: She is oriented to person, place, and time. She appears well-developed and well-nourished.  HENT:  Head: Normocephalic and atraumatic.  Eyes: Conjunctivae and EOM are normal.  Neck: Normal range of motion.  Cardiovascular: Normal rate, regular rhythm, normal heart sounds and intact distal pulses.   Pulmonary/Chest: Effort normal and breath sounds normal.  Abdominal: Soft. Bowel sounds are normal.  Lymphadenopathy:    She has no cervical adenopathy.  Neurological: She is alert and oriented to person, place, and time.  Skin: Skin is warm and dry. Capillary refill takes less than 2 seconds.  Psychiatric: She has a normal mood and affect. Her behavior is normal.  Nursing note and vitals reviewed.    Musculoskeletal Exam: C-spine and thoracic lumbar spine good range of motion. She has some tenderness over left lower paravertebral region of her lumbar spine. Shoulder joints although joints wrist joint MCPs PIPs DIPs with good range of motion with no synovitis. She has good range of motion of her hip joints knee joints ankles MTPs PIPs DIPs. She had tenderness on palpation over left trochanteric bursa. Fibromyalgia tender points with 12 out of 18 positive.  CDAI Exam: No CDAI exam completed.    Investigation: No additional findings.   Imaging: No results found.  Speciality Comments: No specialty comments  available.    Procedures:  Large Joint Inj Date/Time: 02/27/2017 11:40 AM Performed by: Bo Merino Authorized by: Bo Merino   Consent Given by:  Patient Site marked: the procedure site was marked   Timeout: prior to procedure the correct patient, procedure, and site was verified   Indications:  Pain Location:  Hip Site:  L greater trochanter Prep: patient was prepped and draped in usual sterile fashion   Needle Size:  27 G Needle Length:  1.5 inches Approach:  Lateral Ultrasound Guidance: No   Fluoroscopic Guidance: No   Arthrogram: No   Medications:  40 mg triamcinolone acetonide 40 MG/ML; 1.5 mL lidocaine 1 % Aspiration Attempted: No   Aspirate amount (mL):  0 Patient tolerance:  Patient tolerated the procedure well with no immediate complications   Allergies: Codeine; Penicillins; Percocet [oxycodone-acetaminophen]; Percodan [oxycodone-aspirin]; and Donnatal [belladonna alk-phenobarb er]   Assessment / Plan:     Visit Diagnoses: Fibromyalgia -she continues to have some generalized pain from fibromyalgia. She also has some positive tender points. She takes Lyrica 1 when necessary basis and the Skelaxin on when  necessary basis only.   Primary insomnia: It is better with medications.  Fatigue: We'll check vitamin D level today. Patient reports having bone densities done by her PCP.  Trochanteric bursitis of left hip: She's been having severe pain in the left trochanteric area which is also causing nocturnal pain. After different treatment options were discussed and decided to proceed with the cortisone injection and the procedures that described above. A handout on IT band exercises were given.  Chronic midline low back pain : Back exercises were given.  Long term current use of non-steroidal anti-inflammatories (NSAID) - Celebrex 200 mg by mouth daily when necessary. Plan CBC and CMP today  Progressive dementia with uncertain etiology  History of psychotic  reaction/ history of OCD / history of depression  -   She is using Cymbalta 60 mg 2 pills every morning prescribed by her psychiatrist     Orders: Orders Placed This Encounter  Procedures  . Large Joint Injection/Arthrocentesis  . CBC with Differential/Platelet  . COMPLETE METABOLIC PANEL WITH GFR  . VITAMIN D 25 Hydroxy (Vit-D Deficiency, Fractures)   No orders of the defined types were placed in this encounter.   Face-to-face time spent with patient was 40 minutes. 50% of time was spent in counseling and coordination of care.  Follow-Up Instructions: Return in about 1 year (around 02/27/2018) for [place].   Bo Merino, MD  Note - This record has been created using Editor, commissioning.  Chart creation errors have been sought, but may not always  have been located. Such creation errors do not reflect on  the standard of medical care.

## 2017-02-27 ENCOUNTER — Ambulatory Visit (INDEPENDENT_AMBULATORY_CARE_PROVIDER_SITE_OTHER): Payer: Medicare Other | Admitting: Rheumatology

## 2017-02-27 ENCOUNTER — Encounter: Payer: Self-pay | Admitting: Rheumatology

## 2017-02-27 VITALS — BP 97/58 | HR 69 | Resp 14 | Ht 61.0 in | Wt 136.0 lb

## 2017-02-27 DIAGNOSIS — F5101 Primary insomnia: Secondary | ICD-10-CM | POA: Insufficient documentation

## 2017-02-27 DIAGNOSIS — M7062 Trochanteric bursitis, left hip: Secondary | ICD-10-CM | POA: Diagnosis not present

## 2017-02-27 DIAGNOSIS — R5383 Other fatigue: Secondary | ICD-10-CM | POA: Diagnosis not present

## 2017-02-27 DIAGNOSIS — G8929 Other chronic pain: Secondary | ICD-10-CM | POA: Diagnosis not present

## 2017-02-27 DIAGNOSIS — Z8659 Personal history of other mental and behavioral disorders: Secondary | ICD-10-CM | POA: Insufficient documentation

## 2017-02-27 DIAGNOSIS — M5442 Lumbago with sciatica, left side: Secondary | ICD-10-CM | POA: Diagnosis not present

## 2017-02-27 DIAGNOSIS — M797 Fibromyalgia: Secondary | ICD-10-CM

## 2017-02-27 DIAGNOSIS — F0390 Unspecified dementia without behavioral disturbance: Secondary | ICD-10-CM | POA: Diagnosis not present

## 2017-02-27 DIAGNOSIS — Z791 Long term (current) use of non-steroidal anti-inflammatories (NSAID): Secondary | ICD-10-CM | POA: Diagnosis not present

## 2017-02-27 LAB — CBC WITH DIFFERENTIAL/PLATELET
Basophils Absolute: 46 cells/uL (ref 0–200)
Basophils Relative: 1 %
Eosinophils Absolute: 230 cells/uL (ref 15–500)
Eosinophils Relative: 5 %
HEMATOCRIT: 39 % (ref 35.0–45.0)
HEMOGLOBIN: 12.7 g/dL (ref 11.7–15.5)
LYMPHS ABS: 1150 {cells}/uL (ref 850–3900)
Lymphocytes Relative: 25 %
MCH: 30 pg (ref 27.0–33.0)
MCHC: 32.6 g/dL (ref 32.0–36.0)
MCV: 92 fL (ref 80.0–100.0)
MPV: 10.2 fL (ref 7.5–12.5)
Monocytes Absolute: 414 cells/uL (ref 200–950)
Monocytes Relative: 9 %
NEUTROS ABS: 2760 {cells}/uL (ref 1500–7800)
Neutrophils Relative %: 60 %
Platelets: 246 10*3/uL (ref 140–400)
RBC: 4.24 MIL/uL (ref 3.80–5.10)
RDW: 13.3 % (ref 11.0–15.0)
WBC: 4.6 10*3/uL (ref 3.8–10.8)

## 2017-02-27 LAB — COMPLETE METABOLIC PANEL WITH GFR
ALBUMIN: 4.5 g/dL (ref 3.6–5.1)
ALK PHOS: 54 U/L (ref 33–130)
ALT: 11 U/L (ref 6–29)
AST: 13 U/L (ref 10–35)
BILIRUBIN TOTAL: 0.4 mg/dL (ref 0.2–1.2)
BUN: 21 mg/dL (ref 7–25)
CALCIUM: 9.4 mg/dL (ref 8.6–10.4)
CO2: 25 mmol/L (ref 20–31)
CREATININE: 0.83 mg/dL (ref 0.50–0.99)
Chloride: 105 mmol/L (ref 98–110)
GFR, Est African American: 86 mL/min (ref >=60)
GFR, Est Non African American: 74 mL/min (ref >=60)
GLUCOSE: 81 mg/dL (ref 65–99)
Potassium: 4.7 mmol/L (ref 3.5–5.3)
Sodium: 139 mmol/L (ref 135–146)
TOTAL PROTEIN: 6.5 g/dL (ref 6.1–8.1)

## 2017-02-27 MED ORDER — LIDOCAINE HCL 1 % IJ SOLN
1.5000 mL | INTRAMUSCULAR | Status: AC | PRN
  Administered 2017-02-27: 1.5 mL

## 2017-02-27 MED ORDER — TRIAMCINOLONE ACETONIDE 40 MG/ML IJ SUSP
40.0000 mg | INTRAMUSCULAR | Status: AC | PRN
  Administered 2017-02-27: 40 mg via INTRA_ARTICULAR

## 2017-02-27 NOTE — Patient Instructions (Addendum)
Natural anti-inflammatories  You can purchase these at [employer], [employer] or online.  . Turmeric (capsules)  . Ginger (ginger root or capsules)  . Omega 3 (Fish, flax seeds, chia seeds, walnuts, almonds)  . Tart cherry (dried or extract)   Patient should be under the care of a physician while taking these supplements. This may not be reproduced without the permission of Dr. Pollyann SavoyShaili Alberto Richardson. Back Exercises The following exercises strengthen the muscles that help to support the back. They also help to keep the lower back flexible. Doing these exercises can help to prevent back pain or lessen existing pain. If you have back pain or discomfort, try doing these exercises 2-3 times each day or as told by your health care provider. When the pain goes away, do them once each day, but increase the number of times that you repeat the steps for each exercise (do more repetitions). If you do not have back pain or discomfort, do these exercises once each day or as told by your health care provider. Exercises Single Knee to Chest  Repeat these steps 3-5 times for each leg: 1. Lie on your back on a firm bed or the floor with your legs extended. 2. Bring one knee to your chest. Your other leg should stay extended and in contact with the floor. 3. Hold your knee in place by grabbing your knee or thigh. 4. Pull on your knee until you feel a gentle stretch in your lower back. 5. Hold the stretch for 10-30 seconds. 6. Slowly release and straighten your leg.  Pelvic Tilt  Repeat these steps 5-10 times: 1. Lie on your back on a firm bed or the floor with your legs extended. 2. Bend your knees so they are pointing toward the ceiling and your feet are flat on the floor. 3. Tighten your lower abdominal muscles to press your lower back against the floor. This motion will tilt your pelvis so your tailbone points up toward the ceiling instead of pointing to your feet or the floor. 4. With gentle  tension and even breathing, hold this position for 5-10 seconds.  Cat-Cow  Repeat these steps until your lower back becomes more flexible: 1. Get into a hands-and-knees position on a firm surface. Keep your hands under your shoulders, and keep your knees under your hips. You may place padding under your knees for comfort. 2. Let your head hang down, and point your tailbone toward the floor so your lower back becomes rounded like the back of a cat. 3. Hold this position for 5 seconds. 4. Slowly lift your head and point your tailbone up toward the ceiling so your back forms a sagging arch like the back of a cow. 5. Hold this position for 5 seconds.  Press-Ups  Repeat these steps 5-10 times: 1. Lie on your abdomen (face-down) on the floor. 2. Place your palms near your head, about shoulder-width apart. 3. While you keep your back as relaxed as possible and keep your hips on the floor, slowly straighten your arms to raise the top half of your body and lift your shoulders. Do not use your back muscles to raise your upper torso. You may adjust the placement of your hands to make yourself more comfortable. 4. Hold this position for 5 seconds while you keep your back relaxed. 5. Slowly return to lying flat on the floor.  Bridges  Repeat these steps 10 times: 1. Lie on your back on a firm surface. 2. Bend your knees  so they are pointing toward the ceiling and your feet are flat on the floor. 3. Tighten your buttocks muscles and lift your buttocks off of the floor until your waist is at almost the same height as your knees. You should feel the muscles working in your buttocks and the back of your thighs. If you do not feel these muscles, slide your feet 1-2 inches farther away from your buttocks. 4. Hold this position for 3-5 seconds. 5. Slowly lower your hips to the starting position, and allow your buttocks muscles to relax completely.  If this exercise is too easy, try doing it with your arms  crossed over your chest. Abdominal Crunches  Repeat these steps 5-10 times: 1. Lie on your back on a firm bed or the floor with your legs extended. 2. Bend your knees so they are pointing toward the ceiling and your feet are flat on the floor. 3. Cross your arms over your chest. 4. Tip your chin slightly toward your chest without bending your neck. 5. Tighten your abdominal muscles and slowly raise your trunk (torso) high enough to lift your shoulder blades a tiny bit off of the floor. Avoid raising your torso higher than that, because it can put too much stress on your low back and it does not help to strengthen your abdominal muscles. 6. Slowly return to your starting position.  Back Lifts Repeat these steps 5-10 times: 1. Lie on your abdomen (face-down) with your arms at your sides, and rest your forehead on the floor. 2. Tighten the muscles in your legs and your buttocks. 3. Slowly lift your chest off of the floor while you keep your hips pressed to the floor. Keep the back of your head in line with the curve in your back. Your eyes should be looking at the floor. 4. Hold this position for 3-5 seconds. 5. Slowly return to your starting position.  Contact a health care provider if:  Your back pain or discomfort gets much worse when you do an exercise.  Your back pain or discomfort does not lessen within 2 hours after you exercise. If you have any of these problems, stop doing these exercises right away. Do not do them again unless your health care provider says that you can. Get help right away if:  You develop sudden, severe back pain. If this happens, stop doing the exercises right away. Do not do them again unless your health care provider says that you can. This information is not intended to replace advice given to you by your health care provider. Make sure you discuss any questions you have with your health care provider. Document Released: 10/02/2004 Document Revised:  01/02/2016 Document Reviewed: 10/19/2014 Elsevier Interactive Patient Education  2017 [employer] Iliotibial Bursitis Rehab Ask your health care provider which exercises are safe for you. Do exercises exactly as told by your health care provider and adjust them as directed. It is normal to feel mild stretching, pulling, tightness, or discomfort as you do these exercises, but you should stop right away if you feel sudden pain or your pain gets worse.Do not begin these exercises until told by your health care provider. Stretching and range of motion exercises These exercises warm up your muscles and joints and improve the movement and flexibility of your leg. These exercises also help to relieve pain and stiffness. Exercise A: Quadriceps stretch, prone  1. Lie on your abdomen on a firm surface, such as a bed or padded floor. 2. [place]  your left / right knee and hold your ankle. If you cannot reach your ankle or pant leg, loop a belt around your foot and grab the belt instead. 3. Gently pull your heel toward your buttocks. Your knee should not slide out to the side. You should feel a stretch in the front of your thigh and knee. 4. Hold this position for __________ seconds. Repeat __________ times. Complete this exercise __________ times a day. Exercise B: Lunge ( adductor stretch) 1. Stand and spread your legs about 3 feet (about 1 m) apart. Put your left / right leg slightly back for balance. 2. Lean away from your left / right leg by bending your other knee and shifting your weight toward your bent knee. You may rest your hands on your thigh for balance. You should feel a stretch in your left / right inner thigh. 3. Hold for __________ seconds. Repeat __________ times. Complete this exercise __________ times a day. Exercise C: Hamstring stretch, supine  1. Lie on your back. 2. Hold both ends of a belt or towel as you loop it over the ball of your left / right foot. The ball of your foot is on  the walking surface, right under your toes. 3. Straighten your left / right knee and slowly pull on the belt to raise your leg. Stop when you feel a gentle stretch in the back of your left / right knee or thigh. ? Do not let your left / right knee bend. ? Keep your other leg flat on the floor. 4. Hold this position for __________ seconds. Repeat __________ times. Complete this exercise __________ times a day. Strengthening exercises These exercises build strength and endurance in your leg. Endurance is the ability to use your muscles for a long time, even after they get tired. Exercise D: Quadriceps wall slides  1. Lean your back against a smooth wall or door while you walk your feet out 18-24 inches (46-61 cm) from it. 2. Place your feet hip-width apart. 3. Slowly slide down the wall or door until your knees bend as far as told by your health care provider. Keep your knees over your heels, not your toes. Keep your knees in line with your hips. 4. Hold for __________ seconds. 5. Push through your heels to stand up to rest for __________ seconds after each repetition. Repeat __________ times. Complete this exercise __________ times a day. Exercise E: Straight leg raises ( hip abductors) 1. Lie on your side, with your left / right leg in the top position. Lie so your head, shoulder, knee, and hip line up with each other. You may bend your bottom knee to help you balance. 2. Lift your top leg 4-6 inches (10-15 cm) while keeping your toes pointed straight ahead. 3. Hold this position for __________ seconds. 4. Slowly lower your leg to the starting position. Allow your muscles to relax completely after each repetition. Repeat __________ times. Complete this exercise __________ times a day. Exercise F: Straight leg raises ( hip extensors) 1. Lie on your abdomen on a firm surface. You can put a pillow under your hips if that is more comfortable. 2. Tense the muscles in your buttocks and lift your  left / right leg about 4-6 inches (10-15 cm). Keep your knee straight as you lift your leg. 3. Hold this position for __________ seconds. 4. Slowly lower your leg to the starting position. 5. Let your leg relax completely after each repetition. Repeat __________ times. Complete this  exercise __________ times a day. Exercise G: Bridge ( hip extensors) 1. Lie on your back on a firm surface with your knees bent and your feet flat on the floor. 2. Tighten your buttocks muscles and lift your bottom off the floor until your trunk is level with your thighs. ? Do not arch your back. ? You should feel the muscles working in your buttocks and the back of your thighs. If you do not feel these muscles, slide your feet 1-2 inches (2.5-5 cm) farther away from your buttocks. 3. Hold this position for __________ seconds. 4. Slowly lower your hips to the starting position. 5. Let your buttocks muscles relax completely between repetitions. 6. If this exercise is too easy, try doing it with your arms crossed over your chest. Repeat __________ times. Complete this exercise __________ times a day. This information is not intended to replace advice given to you by your health care provider. Make sure you discuss any questions you have with your health care provider. Document Released: 08/25/2005 Document Revised: 05/01/2016 Document Reviewed: 08/07/2015 Elsevier Interactive Patient Education  [employer].

## 2017-02-28 LAB — VITAMIN D 25 HYDROXY (VIT D DEFICIENCY, FRACTURES): Vit D, 25-Hydroxy: 40 ng/mL (ref 30–100)

## 2017-03-01 NOTE — Progress Notes (Signed)
WNL

## 2017-03-03 ENCOUNTER — Telehealth: Payer: Self-pay | Admitting: Radiology

## 2017-03-03 NOTE — Telephone Encounter (Signed)
-----   Message from Pollyann SavoyShaili Deveshwar, MD sent at 03/01/2017  9:34 AM EDT ----- WNL

## 2017-03-03 NOTE — Telephone Encounter (Signed)
I have called patient to advise labs are normal  

## 2017-05-04 ENCOUNTER — Other Ambulatory Visit: Payer: Self-pay | Admitting: Rheumatology

## 2017-05-04 NOTE — Telephone Encounter (Signed)
Last Visit: 02/27/17 Next visit: 03/01/17 Labs: 02/27/17 WNL  Okay to refill per Dr. Corliss Skains

## 2017-05-28 ENCOUNTER — Encounter (HOSPITAL_COMMUNITY): Payer: Self-pay | Admitting: Emergency Medicine

## 2017-05-28 ENCOUNTER — Emergency Department (HOSPITAL_COMMUNITY): Payer: Medicare Other

## 2017-05-28 DIAGNOSIS — Y929 Unspecified place or not applicable: Secondary | ICD-10-CM | POA: Diagnosis not present

## 2017-05-28 DIAGNOSIS — Z7982 Long term (current) use of aspirin: Secondary | ICD-10-CM | POA: Insufficient documentation

## 2017-05-28 DIAGNOSIS — Y999 Unspecified external cause status: Secondary | ICD-10-CM | POA: Insufficient documentation

## 2017-05-28 DIAGNOSIS — T148XXA Other injury of unspecified body region, initial encounter: Secondary | ICD-10-CM | POA: Insufficient documentation

## 2017-05-28 DIAGNOSIS — Z79899 Other long term (current) drug therapy: Secondary | ICD-10-CM | POA: Diagnosis not present

## 2017-05-28 DIAGNOSIS — Y9352 Activity, horseback riding: Secondary | ICD-10-CM | POA: Diagnosis not present

## 2017-05-28 LAB — BASIC METABOLIC PANEL
ANION GAP: 12 (ref 5–15)
BUN: 23 mg/dL — ABNORMAL HIGH (ref 6–20)
CHLORIDE: 104 mmol/L (ref 101–111)
CO2: 21 mmol/L — ABNORMAL LOW (ref 22–32)
Calcium: 9.8 mg/dL (ref 8.9–10.3)
Creatinine, Ser: 0.84 mg/dL (ref 0.44–1.00)
Glucose, Bld: 104 mg/dL — ABNORMAL HIGH (ref 65–99)
POTASSIUM: 4.1 mmol/L (ref 3.5–5.1)
SODIUM: 137 mmol/L (ref 135–145)

## 2017-05-28 LAB — CBC WITH DIFFERENTIAL/PLATELET
BASOS ABS: 0 10*3/uL (ref 0.0–0.1)
BASOS PCT: 0 %
EOS ABS: 0.1 10*3/uL (ref 0.0–0.7)
Eosinophils Relative: 1 %
HCT: 40.5 % (ref 36.0–46.0)
HEMOGLOBIN: 13 g/dL (ref 12.0–15.0)
LYMPHS ABS: 1.1 10*3/uL (ref 0.7–4.0)
Lymphocytes Relative: 10 %
MCH: 30.1 pg (ref 26.0–34.0)
MCHC: 32.1 g/dL (ref 30.0–36.0)
MCV: 93.8 fL (ref 78.0–100.0)
Monocytes Absolute: 0.8 10*3/uL (ref 0.1–1.0)
Monocytes Relative: 7 %
NEUTROS PCT: 82 %
Neutro Abs: 9 10*3/uL — ABNORMAL HIGH (ref 1.7–7.7)
Platelets: 244 10*3/uL (ref 150–400)
RBC: 4.32 MIL/uL (ref 3.87–5.11)
RDW: 12.6 % (ref 11.5–15.5)
WBC: 11.1 10*3/uL — AB (ref 4.0–10.5)

## 2017-05-28 MED ORDER — IBUPROFEN 400 MG PO TABS
ORAL_TABLET | ORAL | Status: AC
  Filled 2017-05-28: qty 1

## 2017-05-28 MED ORDER — IBUPROFEN 400 MG PO TABS
400.0000 mg | ORAL_TABLET | Freq: Once | ORAL | Status: AC | PRN
  Administered 2017-05-28: 400 mg via ORAL

## 2017-05-28 NOTE — ED Triage Notes (Signed)
Patient lost her balance when the saddle slipped and fell from her horse this evening , denies LOC/ambulatory , reports pain at lower back with mild headache , alert and oriented/respirations unlabored. Pain increases with movement /changing positions .

## 2017-05-29 ENCOUNTER — Emergency Department (HOSPITAL_COMMUNITY)
Admission: EM | Admit: 2017-05-29 | Discharge: 2017-05-29 | Disposition: A | Discharge status: Home / Self Care | Payer: Medicare Other | Attending: Emergency Medicine | Admitting: Emergency Medicine

## 2017-05-29 DIAGNOSIS — S2020XA Contusion of thorax, unspecified, initial encounter: Secondary | ICD-10-CM

## 2017-05-29 LAB — URINALYSIS, ROUTINE W REFLEX MICROSCOPIC
BILIRUBIN URINE: NEGATIVE
Glucose, UA: NEGATIVE mg/dL
Hgb urine dipstick: NEGATIVE
KETONES UR: NEGATIVE mg/dL
Nitrite: NEGATIVE
PH: 5 (ref 5.0–8.0)
PROTEIN: NEGATIVE mg/dL
Specific Gravity, Urine: 1.026 (ref 1.005–1.030)

## 2017-05-29 MED ORDER — IBUPROFEN 400 MG PO TABS
400.0000 mg | ORAL_TABLET | Freq: Once | ORAL | Status: AC
  Administered 2017-05-29: 400 mg via ORAL
  Filled 2017-05-29: qty 1

## 2017-05-29 NOTE — ED Notes (Signed)
Pt fell off horse yesterdaay at approx 7pm, has been in waiting room x 12 hours-- pt is alert/oriented x 4, denies any loss of consciousness, no obvious bruising. Husband at bedside.

## 2017-05-29 NOTE — ED Provider Notes (Addendum)
[facility] DEPT Provider Note   CSN: [id number] Arrival date & time: 05/28/17  2027     History   Chief Complaint Chief Complaint  Patient presents with  . Fall ( From Horse)    HPI Diane Richardson is a 66 y.o. female.  The patient presents for evaluation of injury from falling off horse.  Apparently her saddle slipped, causing her to fall onto her side.  She was able to get up with help after a period of time.  He presents for evaluation of lower back pain radiating to the pelvic area bilaterally.  She has been increasingly able to walk since the fall which occurred greater than 12 hours ago.  She denies nausea, vomiting, weakness or paresthesia.  She has been troubled by difficulty walking and is in a gradual state of rehabilitation for the last several years, and typically spends 4-6 hours a day at the barn with her horses.  No recent illnesses.  No dysuria or urinary frequency, dysuria, nausea, vomiting or fever.  There are no other known modifying factors.  HPI  Past Medical History:  Diagnosis Date  . ADD (attention deficit disorder)   . Anxiety   . Depression   . DVT (deep venous thrombosis) ([place])   . Fatigue    chronic  . Fibromyalgia   . Muscle spasm    bad  . PE (pulmonary embolism)   . PTSD (post-traumatic stress disorder)     Patient Active Problem List   Diagnosis Date Noted  . Fibromyalgia 02/27/2017  . Primary insomnia 02/27/2017  . Chronic midline low back pain with left-sided sciatica 02/27/2017  . Long term current use of non-steroidal anti-inflammatories (NSAID) 02/27/2017  . History of psychotic reaction/ history of OCD / history of depression  02/27/2017  . Other psychotic disorder not due to substance or known physiological condition 01/17/2015  . Rigors 01/17/2015  . 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 Progressive dementia with uncertain etiology 02/54/2706  . OCD (obsessive compulsive disorder) 10/16/2014  . Delusional disorder ([place])  10/16/2014  . Mild neurocognitive disorder 10/10/2014  . Mild benzodiazepine use disorder 10/10/2014  . Anxiety disorder 10/10/2014  . Anxiety 08/29/2014  . H/O deep venous thrombosis 08/29/2014  . Healed or old pulmonary embolism 08/29/2014  . Paranoia [facility])     Past Surgical History:  Procedure Laterality Date  . BUNIONECTOMY    . childbirth  1989   x1,NVD  . FOOT SURGERY Right    pt states she had bunion surgery right foot.  Marland Kitchen HAND SURGERY    . PULMONARY EMBOLISM SURGERY  1983   x9 days    OB History    No data available       Home Medications    Prior to Admission medications   Medication Sig Start Date End Date Taking? Authorizing Provider  aspirin EC 81 MG tablet Take 1 tablet (81 mg total) by mouth daily. 10/12/14   Kerrie Buffalo, NP  celecoxib (CELEBREX) 200 MG capsule Take 200 mg by mouth daily as needed.     Bo Merino, MD  celecoxib (CELEBREX) 200 MG capsule TAKE 1 CAPULE BY MOUTH DAILY 02/23/17   Panwala, Naitik, PA-C  celecoxib (CELEBREX) 200 MG capsule TAKE 1 CAPSULE BY MOUTH DAILY 05/04/17   Bo Merino, MD  Cholecalciferol (VITAMIN D3) 3000 UNITS TABS Take 3,000 Units by mouth daily. For nutrition supplementation 10/12/14   Kerrie Buffalo, NP  Coenzyme Q10 (COQ-10) 50 MG CAPS Take 1 capsule by mouth daily. One  capsule with a meal for supplementation 10/12/14   Kerrie Buffalo, NP  DULoxetine (CYMBALTA) 60 MG capsule Take 120 mg by mouth every morning. 04/11/15   [provider]  LORazepam (ATIVAN) 0.5 MG tablet Take 1 tablet (0.5 mg total) by mouth 2 (two) times daily. Patient taking differently: Take 2 mg by mouth See admin instructions.  10/12/14   Kerrie Buffalo, NP  metaxalone (SKELAXIN) 800 MG tablet Take 800 mg by mouth at bedtime as needed for muscle spasms.    Bo Merino, MD  pregabalin (LYRICA) 25 MG capsule Take 50 mg by mouth daily as needed.     Bo Merino, MD  traZODone (DESYREL) 50 MG tablet Take 50 mg by mouth at  bedtime.    [provider]    Family History Family History  Problem Relation Age of Onset  . CAD Father   . Hyperlipidemia Father   . Hypertension Father   . Diabetes Maternal Grandmother   . Cancer Maternal Grandmother        stomach  . Hyperlipidemia Brother   . Dementia Maternal Grandfather   . Colon polyps Maternal Grandfather   . CAD Paternal Grandmother   . Diabetes Paternal Grandmother   . Dementia Paternal Grandfather     Social History Social History  Substance Use Topics  . Smoking status: Never Smoker  . Smokeless tobacco: Never Used  . Alcohol use Yes     Comment: 3 glasses of wine weekly     Allergies   Codeine; Penicillins; Percocet [oxycodone-acetaminophen]; Percodan [oxycodone-aspirin]; and Donnatal [belladonna alk-phenobarb er]   Review of Systems Review of Systems  All other systems reviewed and are negative.    Physical Exam Updated Vital Signs BP (!) 148/78 (BP Location: Left Arm)   Pulse 73   Temp 98.3 F (36.8 C) (Oral)   Resp 14   LMP  (LMP Unknown)   SpO2 99%   Physical Exam  Constitutional: She is oriented to person, place, and time. She appears well-developed. No distress.  Elderly, frail  HENT:  Head: Normocephalic and atraumatic.  Eyes: Pupils are equal, round, and reactive to light. Conjunctivae and EOM are normal.  Neck: Normal range of motion and phonation normal. Neck supple.  Cardiovascular: Normal rate and regular rhythm.   Pulmonary/Chest: Effort normal and breath sounds normal. She exhibits no tenderness.  Abdominal: Soft. She exhibits no distension and no mass. There is tenderness (Very minimal diffuse tenderness.). There is no rebound and no guarding.  Musculoskeletal: Normal range of motion.  No areas of focal tenderness of the thoracic or lumbar or paravertebral musculature.  She is able to ambulate.  Neurological: She is alert and oriented to person, place, and time. She exhibits normal muscle tone.    Skin: Skin is warm and dry.  Psychiatric: Her behavior is normal. Judgment and thought content normal.  She is anxious  Nursing note and vitals reviewed.    ED Treatments / Results  Labs (all labs ordered are listed, but only abnormal results are displayed) Labs Reviewed  CBC WITH DIFFERENTIAL/PLATELET - Abnormal; Notable for the following:       Result Value   WBC 11.1 (*)    Neutro Abs 9.0 (*)    All other components within normal limits  BASIC METABOLIC PANEL - Abnormal; Notable for the following:    CO2 21 (*)    Glucose, Bld 104 (*)    BUN 23 (*)    All other components within normal limits  URINALYSIS, ROUTINE W REFLEX MICROSCOPIC - Abnormal; Notable for the following:    Leukocytes, UA MODERATE (*)    Bacteria, UA RARE (*)    Squamous Epithelial / LPF 0-5 (*)    All other components within normal limits    EKG  EKG Interpretation None       Radiology Dg Lumbar Spine Complete  Result Date: 05/28/2017 CLINICAL DATA:  Back pain after fall from worse tonight. EXAM: LUMBAR SPINE - COMPLETE 4+ VIEW COMPARISON:  CT abdomen and pelvis 11/11/2016. Lumbar spine radiographs 01/23/2013 FINDINGS: Normal alignment of the lumbar spine. Prominent degenerative changes in the lower thoracic spine with bridging anterior osteophytes from T9 through T12. Lateral endplate osteophytes also demonstrated throughout the lumbar spine. No vertebral compression deformities. No focal bone lesion or bone destruction identified. Intervertebral disc space heights are mostly preserved. Visualized sacrum appears intact. IMPRESSION: Degenerative changes in the lower thoracic and lumbar spine. No acute displaced fractures identified. Electronically Signed   By: Lucienne Capers M.D.   On: 05/28/2017 22:13    Procedures Procedures (including critical care time)  Medications Ordered in ED Medications  ibuprofen (ADVIL,MOTRIN) tablet 400 mg (400 mg Oral Given 05/28/17 2128)  ibuprofen (ADVIL,MOTRIN)  tablet 400 mg (400 mg Oral Given 05/29/17 0926)     Initial Impression / Assessment and Plan / ED Course  I have reviewed the triage vital signs and the nursing notes.  Pertinent labs & imaging results that were available during my care of the patient were reviewed by me and considered in my medical decision making (see chart for details).      Patient Vitals for the past 24 hrs:  BP Temp Temp src Pulse Resp SpO2  05/29/17 0922 (!) 148/78 98.3 F (36.8 C) Oral 73 14 99 %  05/29/17 0602 (!) 151/74 98.7 F (37.1 C) Oral 74 17 98 %  05/28/17 2114 (!) 174/76 98.7 F (37.1 C) Oral 65 18 96 %    9:19 AM Reevaluation with update and discussion. After initial assessment and treatment, an updated evaluation reveals no change in clinical status.  Findings discussed with patient and husband, all questions answered. Vaiden Adames L      Final Clinical Impressions(s) / ED Diagnoses   Final diagnoses:  Contusion of multiple sites of trunk, initial encounter    Contusion secondary to fall from horse.  Doubt fracture, visceral injury or significant musculoskeletal injury.  Patient has improved during the period of observation in the emergency department.  Past medical history reviewed, no pertinent processes.  Nursing Notes Reviewed/ Care Coordinated Applicable Imaging Reviewed Interpretation of Laboratory Data incorporated into ED treatment  The patient appears reasonably screened and/or stabilized for discharge and I doubt any other medical condition or other [facility] requiring further screening, evaluation, or treatment in the ED at this time prior to discharge.  Plan: Home Medications-continue current medications and use ibuprofen for pain; Home Treatments-rest, cryotherapy and heat therapy, gradually advance activity; return here if the recommended treatment, does not improve the symptoms; Recommended follow up-PCP, as needed   New Prescriptions New Prescriptions   No medications on file      Daleen Bo, MD 05/29/17 3532    Daleen Bo, MD 05/29/17 1357

## 2017-05-29 NOTE — Discharge Instructions (Signed)
Continue to use ibuprofen 400 mg 3 times a day with meals, for pain.  Use ice on the sore spots today and tomorrow, after that use heat.  Return here or see your primary care doctor as needed for problems.

## 2017-06-07 ENCOUNTER — Other Ambulatory Visit: Payer: Self-pay | Admitting: Rheumatology

## 2017-06-08 NOTE — Telephone Encounter (Signed)
Last Visit: 02/27/17 Next visit: 03/01/18 Labs: 02/27/17 WNL  Okay to refill per Dr. Corliss Skains

## 2017-07-04 ENCOUNTER — Other Ambulatory Visit: Payer: Self-pay | Admitting: Rheumatology

## 2017-07-06 NOTE — Telephone Encounter (Signed)
Last Visit: 02/27/17 Next visit: 03/01/18 Labs: 02/27/17 WNL  Okay to refill per Dr. Corliss Skainseveshwar

## 2018-02-15 NOTE — Progress Notes (Signed)
Office Visit Note  Patient: Diane Richardson             Date of Birth: 10/01/1950           MRN: [id number]             PCP: Carol Ada, MD Referring: Carol Ada, MD Visit Date: 03/01/2018 Occupation: _0 @    Subjective:  Generalized pain.   History of Present Illness: Diane Richardson is a 67 y.o. female with history of fibromyalgia syndrome.  She states she has been working with her horses on a regular basis she has been horse riding and also swimming.  Overall pain level has been better.  The trochanteric bursitis has resolved.  She has discomfort in her lower back off and on with certain activities only.  Activities of Daily Living:  Patient reports morning stiffness for 0 minutes.   Patient Reports nocturnal pain.  Difficulty dressing/grooming: Denies Difficulty climbing stairs: Denies Difficulty getting out of chair: Denies Difficulty using hands for taps, buttons, cutlery, and/or writing: Denies   Review of Systems  Constitutional: Positive for fatigue. Negative for night sweats, weight gain and weight loss.  HENT: Positive for mouth dryness. Negative for mouth sores, trouble swallowing, trouble swallowing and nose dryness.   Eyes: Positive for dryness. Negative for pain, redness and visual disturbance.  Respiratory: Negative for cough, shortness of breath and difficulty breathing.   Cardiovascular: Negative for chest pain, palpitations, hypertension, irregular heartbeat and swelling in legs/feet.  Gastrointestinal: Negative for abdominal pain, blood in stool, constipation and diarrhea.  Endocrine: Negative for increased urination.  Genitourinary: Negative for pelvic pain and vaginal dryness.  Musculoskeletal: Positive for myalgias and myalgias. Negative for arthralgias, joint pain, joint swelling, muscle weakness, morning stiffness and muscle tenderness.  Skin: Negative for color change, rash, hair loss, skin tightness, ulcers and sensitivity to sunlight.    Allergic/Immunologic: Negative for susceptible to infections.  Neurological: Positive for memory loss. Negative for dizziness, light-headedness, headaches, night sweats and weakness.  Hematological: Negative for bruising/bleeding tendency and swollen glands.  Psychiatric/Behavioral: Positive for depressed mood. Negative for confusion and sleep disturbance. The patient is nervous/anxious.     PMFS History:  Patient Active Problem List   Diagnosis Date Noted  . Fibromyalgia 02/27/2017  . Primary insomnia 02/27/2017  . Chronic midline low back pain with left-sided sciatica 02/27/2017  . Long term current use of non-steroidal anti-inflammatories (NSAID) 02/27/2017  . History of psychotic reaction/ history of OCD / history of depression  02/27/2017  . Other psychotic disorder not due to substance or known physiological condition ([place]) 01/17/2015  . Rigors 01/17/2015  . 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 Progressive dementia with uncertain etiology 30/01/1101  . OCD (obsessive compulsive disorder) 10/16/2014  . Delusional disorder ([place]) 10/16/2014  . Mild neurocognitive disorder 10/10/2014  . Mild benzodiazepine use disorder ([place]) 10/10/2014  . Anxiety disorder 10/10/2014  . Anxiety 08/29/2014  . H/O deep venous thrombosis 08/29/2014  . Healed or old pulmonary embolism 08/29/2014  . Paranoia [facility])     Past Medical History:  Diagnosis Date  . ADD (attention deficit disorder)   . Anxiety   . Depression   . DVT (deep venous thrombosis) ([place])   . Fatigue    chronic  . Fibromyalgia   . Muscle spasm    bad  . PE (pulmonary embolism)   . PTSD (post-traumatic stress disorder)     Family History  Problem Relation Age of Onset  . Cancer Mother  ovarian   . CAD Father   . Hyperlipidemia Father   . Hypertension Father   . Diabetes Maternal Grandmother   . Cancer Maternal Grandmother        stomach  . Arthritis Brother   . Dementia Maternal Grandfather   . Colon polyps  Maternal Grandfather   . CAD Paternal Grandmother   . Diabetes Paternal Grandmother   . Dementia Paternal Grandfather    Past Surgical History:  Procedure Laterality Date  . BUNIONECTOMY    . childbirth  1989   x1,NVD  . FOOT SURGERY Right    pt states she had bunion surgery right foot.  Marland Kitchen HAND SURGERY    . PULMONARY EMBOLISM SURGERY  1983   x9 days   Social History   Social History Narrative   Drinks less than one cup of caffeine daily.     Objective: Vital Signs: BP 108/63 (BP Location: Left Arm, Patient Position: Sitting, Cuff Size: Normal)   Pulse 69   Resp 14   Ht _0  (1.549 m)   Wt 129 lb (58.5 kg)   LMP  (LMP Unknown)   BMI 24.37 kg/m    Physical Exam  Constitutional: She is oriented to person, place, and time. She appears well-developed and well-nourished.  HENT:  Head: Normocephalic and atraumatic.  Eyes: Conjunctivae and EOM are normal.  Neck: Normal range of motion.  Cardiovascular: Normal rate, regular rhythm, normal heart sounds and intact distal pulses.  Pulmonary/Chest: Effort normal and breath sounds normal.  Abdominal: Soft. Bowel sounds are normal.  Lymphadenopathy:    She has no cervical adenopathy.  Neurological: She is alert and oriented to person, place, and time.  Skin: Skin is warm and dry. Capillary refill takes less than 2 seconds.  Psychiatric: She has a normal mood and affect. Her behavior is normal.  Nursing note and vitals reviewed.    Musculoskeletal Exam: C-spine thoracic lumbar spine good range of motion.  She has some discomfort range of motion of her lumbar spine.  Shoulder joints elbow joints wrist joint MCPs PIPs DIPs were in good range of motion with no synovitis.  Hip joints knee joints ankles MTPs PIPs been good range of motion with no synovitis.  She had mild tenderness over bilateral trochanteric bursa.  She has some generalized hyperalgesia.  CDAI Exam: No CDAI exam completed.    Investigation: No additional  findings. CBC Latest Ref Rng & Units 05/28/2017 02/27/2017 01/16/2015  WBC 4.0 - 10.5 K/uL 11.1(H) 4.6 7.4  Hemoglobin 12.0 - 15.0 g/dL 13.0 12.7 14.4  Hematocrit 36.0 - 46.0 % 40.5 39.0 42.6  Platelets 150 - 400 K/uL 244 246 269   CMP Latest Ref Rng & Units 05/28/2017 02/27/2017 01/16/2015  Glucose 65 - 99 mg/dL 104(H) 81 103(H)  BUN 6 - 20 mg/dL 23(H) 21 20  Creatinine 0.44 - 1.00 mg/dL 0.84 0.83 0.87  Sodium 135 - 145 mmol/L 137 139 139  Potassium 3.5 - 5.1 mmol/L 4.1 4.7 4.2  Chloride 101 - 111 mmol/L 104 105 102  CO2 22 - 32 mmol/L 21(L) 25 24  Calcium 8.9 - 10.3 mg/dL 9.8 9.4 10.3  Total Protein 6.1 - 8.1 g/dL - 6.5 7.2  Total Bilirubin 0.2 - 1.2 mg/dL - 0.4 0.6  Alkaline Phos 33 - 130 U/L - 54 47  AST 10 - 35 U/L - 13 30  ALT 6 - 29 U/L - 11 34    Imaging: No results found.  Speciality Comments: No specialty  comments available.    Procedures:  No procedures performed Allergies: Codeine; Penicillins; Percocet [oxycodone-acetaminophen]; Percodan [oxycodone-aspirin]; and Donnatal [belladonna alk-phenobarb er]   Assessment / Plan:     Visit Diagnoses: Fibromyalgia-she continues to have some generalized pain and discomfort.  She had few tender points.    Primary insomnia-her insomnia is manageable with current medications.  Other fatigue-she continues to have some fatigue.  Chronic midline low back pain with left-sided sciatica-her lower back pain fluctuates with activities.  She takes Celebrex which has been helpful.  She requested a refill on Skelaxin which she uses on PRN for muscle spasms.  Long term current use of non-steroidal anti-inflammatories (NSAID) - Celebrex 200 mg by mouth daily when necessary.  A prescription refill for Celebrex was given today.  Medication management - Plan: CBC with Differential/Platelet, COMPLETE METABOLIC PANEL WITH GFR to monitor long-term use of NSAIDs.  History of psychotic reaction/ history of OCD / history of depression  - She is  using Cymbalta 60 mg 2 pills every morning prescribed by her psychiatrist    Progressive dementia with uncertain etiology    Orders: Orders Placed This Encounter  Procedures  . CBC with Differential/Platelet  . COMPLETE METABOLIC PANEL WITH GFR   Meds ordered this encounter  Medications  . celecoxib (CELEBREX) 200 MG capsule    Sig: Take 1 capsule (200 mg total) by mouth daily as needed.    Dispense:  30 capsule    Refill:  4  . metaxalone (SKELAXIN) 800 MG tablet    Sig: Take 1 tablet (800 mg total) by mouth at bedtime as needed for muscle spasms.    Dispense:  30 tablet    Refill:  4    Face-to-face time spent with patient was 30 minutes.> 50% of time was spent in counseling and coordination of care.  Follow-Up Instructions: Return in about 6 months (around 08/31/2018) for FMS.   Bo Merino, MD  Note - This record has been created using Editor, commissioning.  Chart creation errors have been sought, but may not always  have been located. Such creation errors do not reflect on  the standard of medical care.

## 2018-03-01 ENCOUNTER — Encounter: Payer: Self-pay | Admitting: Rheumatology

## 2018-03-01 ENCOUNTER — Ambulatory Visit: Payer: Medicare Other | Admitting: Rheumatology

## 2018-03-01 VITALS — BP 108/63 | HR 69 | Resp 14 | Ht 61.0 in | Wt 129.0 lb

## 2018-03-01 DIAGNOSIS — R5383 Other fatigue: Secondary | ICD-10-CM | POA: Diagnosis not present

## 2018-03-01 DIAGNOSIS — M797 Fibromyalgia: Secondary | ICD-10-CM

## 2018-03-01 DIAGNOSIS — F5101 Primary insomnia: Secondary | ICD-10-CM | POA: Diagnosis not present

## 2018-03-01 DIAGNOSIS — G8929 Other chronic pain: Secondary | ICD-10-CM

## 2018-03-01 DIAGNOSIS — Z791 Long term (current) use of non-steroidal anti-inflammatories (NSAID): Secondary | ICD-10-CM | POA: Diagnosis not present

## 2018-03-01 DIAGNOSIS — Z8659 Personal history of other mental and behavioral disorders: Secondary | ICD-10-CM

## 2018-03-01 DIAGNOSIS — Z79899 Other long term (current) drug therapy: Secondary | ICD-10-CM | POA: Diagnosis not present

## 2018-03-01 DIAGNOSIS — F0390 Unspecified dementia without behavioral disturbance: Secondary | ICD-10-CM | POA: Diagnosis not present

## 2018-03-01 DIAGNOSIS — M5442 Lumbago with sciatica, left side: Secondary | ICD-10-CM | POA: Diagnosis not present

## 2018-03-01 MED ORDER — METAXALONE 800 MG PO TABS: 800.0000 mg | ORAL_TABLET | Freq: Every evening | ORAL | 4 refills | Status: DC | PRN

## 2018-03-01 MED ORDER — CELECOXIB 200 MG PO CAPS: 200.0000 mg | ORAL_CAPSULE | Freq: Every day | ORAL | 4 refills | Status: DC | PRN

## 2018-03-02 LAB — CBC WITH DIFFERENTIAL/PLATELET
BASOS PCT: 1.2 %
Basophils Absolute: 50 cells/uL (ref 0–200)
Eosinophils Absolute: 147 cells/uL (ref 15–500)
Eosinophils Relative: 3.5 %
HCT: 38.4 % (ref 35.0–45.0)
Hemoglobin: 13 g/dL (ref 11.7–15.5)
Lymphs Abs: 924 cells/uL (ref 850–3900)
MCH: 30.3 pg (ref 27.0–33.0)
MCHC: 33.9 g/dL (ref 32.0–36.0)
MCV: 89.5 fL (ref 80.0–100.0)
MONOS PCT: 11.1 %
MPV: 10.6 fL (ref 7.5–12.5)
NEUTROS ABS: 2612 {cells}/uL (ref 1500–7800)
Neutrophils Relative %: 62.2 %
Platelets: 244 10*3/uL (ref 140–400)
RBC: 4.29 10*6/uL (ref 3.80–5.10)
RDW: 12.5 % (ref 11.0–15.0)
TOTAL LYMPHOCYTE: 22 %
WBC mixed population: 466 cells/uL (ref 200–950)
WBC: 4.2 10*3/uL (ref 3.8–10.8)

## 2018-03-02 LAB — COMPLETE METABOLIC PANEL WITHOUT GFR
AG Ratio: 2.1 (calc) (ref 1.0–2.5)
ALT: 13 U/L (ref 6–29)
AST: 14 U/L (ref 10–35)
Albumin: 4.6 g/dL (ref 3.6–5.1)
Alkaline phosphatase (APISO): 66 U/L (ref 33–130)
BUN/Creatinine Ratio: 31 (calc) — ABNORMAL HIGH (ref 6–22)
BUN: 26 mg/dL — ABNORMAL HIGH (ref 7–25)
CO2: 28 mmol/L (ref 20–32)
Calcium: 10 mg/dL (ref 8.6–10.4)
Chloride: 104 mmol/L (ref 98–110)
Creat: 0.85 mg/dL (ref 0.50–0.99)
GFR, Est African American: 83 mL/min/{1.73_m2}
GFR, Est Non African American: 71 mL/min/{1.73_m2}
Globulin: 2.2 g/dL (ref 1.9–3.7)
Glucose, Bld: 92 mg/dL (ref 65–99)
Potassium: 4.6 mmol/L (ref 3.5–5.3)
Sodium: 139 mmol/L (ref 135–146)
Total Bilirubin: 0.5 mg/dL (ref 0.2–1.2)
Total Protein: 6.8 g/dL (ref 6.1–8.1)

## 2018-03-19 ENCOUNTER — Other Ambulatory Visit: Payer: Self-pay | Admitting: *Deleted

## 2018-03-19 MED ORDER — METHOCARBAMOL 500 MG PO TABS: 500.0000 mg | ORAL_TABLET | Freq: Every evening | ORAL | 0 refills | Status: DC | PRN

## 2018-03-19 NOTE — Telephone Encounter (Signed)
Metaxalone has been denied. Per Dr. Corliss Skainseveshwar okay to send in prescription for  Methocarbamol

## 2018-07-28 ENCOUNTER — Other Ambulatory Visit: Payer: Self-pay | Admitting: Rheumatology

## 2018-07-28 NOTE — Telephone Encounter (Signed)
Last Visit: 03/01/18 Next Visit: 08/25/18 Labs: 03/01/18 BUN borderline elevated. All other labs are WNL.  Okay to refill per Dr. Corliss Skainseveshwar

## 2018-08-11 NOTE — Progress Notes (Deleted)
Office Visit Note  Patient: Diane Richardson             Date of Birth: [birth year]           MRN: [id number]             PCP: Carol Ada, MD Referring: Carol Ada, MD Visit Date: 08/25/2018 Occupation: _0 @  Subjective:  No chief complaint on file.   History of Present Illness: Diane Richardson is a 67 y.o. female ***   Activities of Daily Living:  Patient reports morning stiffness for *** {minute/hour:19697}.   Patient {ACTIONS;DENIES/REPORTS:21021675::"Denies"} nocturnal pain.  Difficulty dressing/grooming: {ACTIONS;DENIES/REPORTS:21021675::"Denies"} Difficulty climbing stairs: {ACTIONS;DENIES/REPORTS:21021675::"Denies"} Difficulty getting out of chair: {ACTIONS;DENIES/REPORTS:21021675::"Denies"} Difficulty using hands for taps, buttons, cutlery, and/or writing: {ACTIONS;DENIES/REPORTS:21021675::"Denies"}  No Rheumatology ROS completed.   PMFS History:  Patient Active Problem List   Diagnosis Date Noted  . Fibromyalgia 02/27/2017  . Primary insomnia 02/27/2017  . Chronic midline low back pain with left-sided sciatica 02/27/2017  . Long term current use of non-steroidal anti-inflammatories (NSAID) 02/27/2017  . History of psychotic reaction/ history of OCD / history of depression  02/27/2017  . Other psychotic disorder not due to substance or known physiological condition ([place]) 01/17/2015  . Rigors 01/17/2015  . 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 Progressive dementia with uncertain etiology ([place]) 01/17/2015  . OCD (obsessive compulsive disorder) 10/16/2014  . Delusional disorder ([place]) 10/16/2014  . Mild neurocognitive disorder 10/10/2014  . Mild benzodiazepine use disorder ([place]) 10/10/2014  . Anxiety disorder 10/10/2014  . Anxiety 08/29/2014  . H/O deep venous thrombosis 08/29/2014  . Healed or old pulmonary embolism 08/29/2014  . Paranoia [facility])     Past Medical History:  Diagnosis Date  . ADD (attention deficit disorder)   . Anxiety   . Depression     . DVT (deep venous thrombosis) ([place])   . Fatigue    chronic  . Fibromyalgia   . Muscle spasm    bad  . PE (pulmonary embolism)   . PTSD (post-traumatic stress disorder)     Family History  Problem Relation Age of Onset  . Cancer Mother        ovarian   . CAD Father   . Hyperlipidemia Father   . Hypertension Father   . Diabetes Maternal Grandmother   . Cancer Maternal Grandmother        stomach  . Arthritis Brother   . Dementia Maternal Grandfather   . Colon polyps Maternal Grandfather   . CAD Paternal Grandmother   . Diabetes Paternal Grandmother   . Dementia Paternal Grandfather    Past Surgical History:  Procedure Laterality Date  . BUNIONECTOMY    . childbirth  1989   x1,NVD  . FOOT SURGERY Right    pt states she had bunion surgery right foot.  Marland Kitchen HAND SURGERY    . PULMONARY EMBOLISM SURGERY  1983   x9 days   Social History   Social History Narrative   Drinks less than one cup of caffeine daily.    Objective: Vital Signs: LMP  (LMP Unknown)    Physical Exam   Musculoskeletal Exam: ***  CDAI Exam: CDAI Score: Not documented Patient Global Assessment: Not documented; Provider Global Assessment: Not documented Swollen: Not documented; Tender: Not documented Joint Exam   Not documented   There is currently no information documented on the homunculus. Go to the Rheumatology activity and complete the homunculus joint exam.  Investigation: No additional findings.  Imaging: No results found.  Recent  Labs: Lab Results  Component Value Date   WBC 4.2 03/01/2018   HGB 13.0 03/01/2018   PLT 244 03/01/2018   NA 139 03/01/2018   K 4.6 03/01/2018   CL 104 03/01/2018   CO2 28 03/01/2018   GLUCOSE 92 03/01/2018   BUN 26 (H) 03/01/2018   CREATININE 0.85 03/01/2018   BILITOT 0.5 03/01/2018   ALKPHOS 54 02/27/2017   AST 14 03/01/2018   ALT 13 03/01/2018   PROT 6.8 03/01/2018   ALBUMIN 4.5 02/27/2017   CALCIUM 10.0 03/01/2018   GFRAA 83 03/01/2018     Speciality Comments: No specialty comments available.  Procedures:  No procedures performed Allergies: Codeine; Penicillins; Percocet [oxycodone-acetaminophen]; Percodan [oxycodone-aspirin]; and Donnatal [belladonna alk-phenobarb er]   Assessment / Plan:     Visit Diagnoses: Fibromyalgia  Primary insomnia  Other fatigue  Long term current use of non-steroidal anti-inflammatories (NSAID) - Celebrex 200 mg by mouth daily when necessary.   Trochanteric bursitis of left hip  History of psychotic reaction/ history of OCD / history of depression   Progressive dementia with uncertain etiology ([place])   Orders: No orders of the defined types were placed in this encounter.  No orders of the defined types were placed in this encounter.   Face-to-face time spent with patient was *** minutes. Greater than 50% of time was spent in counseling and coordination of care.  Follow-Up Instructions: No follow-ups on file.   Ofilia Neas, PA-C  Note - This record has been created using Dragon software.  Chart creation errors have been sought, but may not always  have been located. Such creation errors do not reflect on  the standard of medical care.

## 2018-08-25 ENCOUNTER — Ambulatory Visit: Payer: Medicare Other | Admitting: Physician Assistant

## 2018-08-25 ENCOUNTER — Ambulatory Visit: Payer: Medicare Other | Admitting: Rheumatology

## 2018-08-26 ENCOUNTER — Other Ambulatory Visit: Payer: Self-pay | Admitting: Rheumatology

## 2018-08-26 NOTE — Telephone Encounter (Signed)
Last Visit: 03/01/18 Next Visit: Patient's appointment has to be reschedule from 08/25/18. Patient to call and reschedule.  Labs: 03/01/18 BUN borderline elevated. All other labs are WNL.  Okay to refill 30 day supply per Dr. Corliss Skainseveshwar

## 2018-10-01 ENCOUNTER — Other Ambulatory Visit: Payer: Self-pay | Admitting: Rheumatology

## 2018-10-10 ENCOUNTER — Other Ambulatory Visit: Payer: Self-pay | Admitting: Cardiology

## 2018-10-10 DIAGNOSIS — R0989 Other specified symptoms and signs involving the circulatory and respiratory systems: Secondary | ICD-10-CM

## 2018-10-10 DIAGNOSIS — I209 Angina pectoris, unspecified: Secondary | ICD-10-CM

## 2018-10-11 ENCOUNTER — Other Ambulatory Visit: Payer: Self-pay

## 2018-10-11 MED ORDER — SIMVASTATIN 40 MG PO TABS: 40.0000 mg | ORAL_TABLET | Freq: Every day | ORAL | 1 refills | Status: DC

## 2018-10-11 MED ORDER — METOPROLOL SUCCINATE ER 25 MG PO TB24: 25.0000 mg | ORAL_TABLET | Freq: Every day | ORAL | 1 refills | Status: DC

## 2018-10-19 ENCOUNTER — Other Ambulatory Visit: Payer: Self-pay | Admitting: Cardiology

## 2018-10-19 DIAGNOSIS — I209 Angina pectoris, unspecified: Secondary | ICD-10-CM

## 2018-10-25 ENCOUNTER — Ambulatory Visit: Payer: Self-pay

## 2018-10-25 DIAGNOSIS — I200 Unstable angina: Secondary | ICD-10-CM

## 2018-10-25 DIAGNOSIS — I209 Angina pectoris, unspecified: Secondary | ICD-10-CM

## 2018-10-29 ENCOUNTER — Ambulatory Visit: Payer: Medicare Other

## 2018-10-29 ENCOUNTER — Ambulatory Visit: Payer: Self-pay

## 2018-10-29 DIAGNOSIS — R0989 Other specified symptoms and signs involving the circulatory and respiratory systems: Secondary | ICD-10-CM

## 2018-10-29 DIAGNOSIS — I209 Angina pectoris, unspecified: Secondary | ICD-10-CM

## 2018-10-31 ENCOUNTER — Telehealth: Payer: Self-pay | Admitting: Cardiology

## 2018-11-12 ENCOUNTER — Ambulatory Visit: Payer: Medicare Other | Admitting: Cardiology

## 2018-11-12 ENCOUNTER — Encounter: Payer: Self-pay | Admitting: Cardiology

## 2018-11-12 VITALS — BP 114/70 | HR 60 | Ht 62.0 in | Wt 138.4 lb

## 2018-11-12 DIAGNOSIS — R9439 Abnormal result of other cardiovascular function study: Secondary | ICD-10-CM | POA: Diagnosis not present

## 2018-11-12 DIAGNOSIS — E782 Mixed hyperlipidemia: Secondary | ICD-10-CM

## 2018-11-12 DIAGNOSIS — I209 Angina pectoris, unspecified: Secondary | ICD-10-CM

## 2018-11-12 DIAGNOSIS — R0989 Other specified symptoms and signs involving the circulatory and respiratory systems: Secondary | ICD-10-CM | POA: Diagnosis not present

## 2018-11-12 NOTE — Progress Notes (Signed)
Subjective:   Diane Richardson, female    DOB: [birth year], 68 y.o.   MRN: [id number]  Merri Brunette, MD:  Chief Complaint  Patient presents with  . Heart Problem  . Follow-up    HPI: Diane Richardson  is a 68 y.o. female  with history of fibromyalgia, depression, bipolar disorder, hyperlipidemia, recently evaluated by [place] for symptoms of angina.  She underwent exercise nuclear stress test, echocardiogram, and carotid duplex and now presents to discuss results.   Patient was started on Metoprolol and statin therapy that she is tolerating well. Since last seen by [place], she continues to have chest tightness with minimal activities. She reports avoiding doing anything strenuous as she knows that she will have symptoms with this. Does report using nitroglycerin twice and chest pain was responsive to this. Denies any dyspnea.     Past Medical History:  Diagnosis Date  . ADD (attention deficit disorder)   . Anxiety   . Depression   . DVT (deep venous thrombosis) (HCC)   . Fatigue    chronic  . Fibromyalgia   . Muscle spasm    bad  . PE (pulmonary embolism)   . PTSD (post-traumatic stress disorder)     Past Surgical History:  Procedure Laterality Date  . BUNIONECTOMY    . childbirth  1989   x1,NVD  . FOOT SURGERY Right    pt states she had bunion surgery right foot.  Marland Kitchen HAND SURGERY    . PULMONARY EMBOLISM SURGERY  1983   x9 days    Family History  Problem Relation Age of Onset  . Cancer Mother        ovarian   . CAD Father   . Hyperlipidemia Father   . Hypertension Father   . Diabetes Maternal Grandmother   . Cancer Maternal Grandmother        stomach  . Arthritis Brother   . Dementia Maternal Grandfather   . Colon polyps Maternal Grandfather   . CAD Paternal Grandmother   . Diabetes Paternal Grandmother   . Dementia Paternal Grandfather     Social History   Socioeconomic History  . Marital status: Married    Spouse name: Not on file  . Number of  children: 1  . Years of education: PHD  . Highest education level: Not on file  Occupational History  . Occupation: disabled    Comment: FORMER TEACHER  Social Needs  . Financial resource strain: Not on file  . Food insecurity:    Worry: Not on file    Inability: Not on file  . Transportation needs:    Medical: Not on file    Non-medical: Not on file  Tobacco Use  . Smoking status: Never Smoker  . Smokeless tobacco: Never Used  Substance and Sexual Activity  . Alcohol use: Yes    Comment: 3 glasses of wine weekly  . Drug use: Never  . Sexual activity: Yes  Lifestyle  . Physical activity:    Days per week: Not on file    Minutes per session: Not on file  . Stress: Not on file  Relationships  . Social connections:    Talks on phone: Not on file    Gets together: Not on file    Attends religious service: Not on file    Active member of club or organization: Not on file    Attends meetings of clubs or organizations: Not on file    Relationship status:  Not on file  . Intimate partner violence:    Fear of current or ex partner: Not on file    Emotionally abused: Not on file    Physically abused: Not on file    Forced sexual activity: Not on file  Other Topics Concern  . Not on file  Social History Narrative   Drinks less than one cup of caffeine daily.    Current Meds  Medication Sig  . aspirin EC 81 MG tablet Take 1 tablet (81 mg total) by mouth daily.  . celecoxib (CELEBREX) 200 MG capsule TAKE 1 CAPSULE(200 MG) BY MOUTH DAILY AS NEEDED (Patient taking differently: as needed. )  . Cholecalciferol (VITAMIN D3) 3000 UNITS TABS Take 3,000 Units by mouth daily. For nutrition supplementation  . Coenzyme Q10 (COQ-10) 50 MG CAPS Take 1 capsule by mouth daily. One capsule with a meal for supplementation  . DULoxetine (CYMBALTA) 60 MG capsule Take 120 mg by mouth every morning.  Marland Kitchen LORazepam (ATIVAN) 0.5 MG tablet Take 1 tablet (0.5 mg total) by mouth 2 (two) times daily.  (Patient taking differently: Take 2 mg by mouth See admin instructions. )  . metaxalone (SKELAXIN) 800 MG tablet Take 1 tablet (800 mg total) by mouth at bedtime as needed for muscle spasms.  . methocarbamol (ROBAXIN) 500 MG tablet Take 1 tablet (500 mg total) by mouth at bedtime as needed for muscle spasms.  . metoprolol succinate (TOPROL-XL) 25 MG 24 hr tablet Take 1 tablet (25 mg total) by mouth at bedtime.  Marland Kitchen QUEtiapine (SEROQUEL) 50 MG tablet Take 50 mg by mouth at bedtime.  . simvastatin (ZOCOR) 40 MG tablet Take 1 tablet (40 mg total) by mouth at bedtime.     Review of Systems  Constitution: Negative. Negative for fever, malaise/fatigue, weight gain and weight loss.  HENT: Negative for sore throat.   Eyes: Negative for visual disturbance.  Cardiovascular: Positive for chest pain. Negative for claudication, dyspnea on exertion, leg swelling, palpitations and syncope.  Respiratory: Negative.  Negative for cough and shortness of breath.   Hematologic/Lymphatic: Negative.   Skin: Negative.   Musculoskeletal: Negative for back pain, joint swelling and muscle weakness.  Gastrointestinal: Negative for abdominal pain, change in bowel habit, nausea and vomiting.  Neurological: Negative.   Psychiatric/Behavioral: Negative for depression and substance abuse.  All other systems reviewed and are negative.      Objective:     Blood pressure 114/70, pulse 60, height 5\' 2"  (1.575 m), weight 138 lb 6.4 oz (62.8 kg), SpO2 98 %.  Cardiac studies:  EKG 10/06/2018: Normal sinus rhythm at rate of 71 bpm, normal axis, inferior and lateral ST depression, cannot exclude ischemia. Abnormal EKG. No significant change from 10/04/17.  Echocardiogram 10/29/2018: Left ventricle cavity is normal in size. Mild concentric hypertrophy of the left ventricle. Normal global wall motion. Normal diastolic filling pattern. Calculated EF 58%. Mild to moderate mitral regurgitation. Mild tricuspid regurgitation. No  evidence of pulmonary hypertension.  Carotid artery duplex 10/29/2018: Minimal mixed plaque in the right ICA. Stenosis in the right external carotid artery (>50%). Stenosis in the left internal carotid artery (50-69%). Mild mixed plaque in the left common carotid artery (<50%). Antegrade right vertebral artery flow. Antegrade left vertebral artery flow. Follow up in six months is appropriate if clinically indicated.  Exercise sestamibi stress test 10/25/2018: 1. The patient performed treadmill exercise using Bruce protocol, completing 5:03 minutes. The patient completed an estimated workload of 7 METS, reaching 86% of the maximum predicted  heart rate. Exercise capacity was low. Hemodynamic response was normal. Stress symptoms included exercise induced chest pain, relieved with rest.  2. The overall quality of the study is good. There is no evidence of abnormal lung activity. Stress and rest SPECT images demonstrate homogeneous tracer distribution throughout the myocardium. TID was mildly increased at 1.1. Gated SPECT imaging reveals normal myocardial thickening and wall motion. The left ventricular ejection fraction was normal (66%).  3. Intermediate risk study given symptoms and EKG changes. No SPECT evidence of ischemia or infarction see. Clinical correlation recommended.   Recent Labs:  CBC Latest Ref Rng & Units 03/01/2018  WBC 3.8 - 10.8 Thousand/uL 4.2  Hemoglobin 11.7 - 15.5 g/dL 16.1  Hematocrit 09.6 - 45.0 % 38.4  Platelets 140 - 400 Thousand/uL 244   CMP Latest Ref Rng & Units 03/01/2018  Glucose 65 - 99 mg/dL 92  BUN 7 - 25 mg/dL 04(V)  Creatinine 4.09 - 0.99 mg/dL 8.11  Sodium 914 - 782 mmol/L 139  Potassium 3.5 - 5.3 mmol/L 4.6  Chloride 98 - 110 mmol/L 104  CO2 20 - 32 mmol/L 28  Calcium 8.6 - 10.4 mg/dL 95.6  Total Protein 6.1 - 8.1 g/dL 6.8  Total Bilirubin 0.2 - 1.2 mg/dL 0.5  Alkaline Phos 33 - 130 U/L -  AST 10 - 35 U/L 14  ALT 6 - 29 U/L 13     Physical  Exam  Constitutional: She appears well-developed and well-nourished. No distress.  HENT:  Head: Atraumatic.  Eyes: Conjunctivae are normal.  Neck: Neck supple. No JVD present. No thyromegaly present.  Cardiovascular: Normal rate, regular rhythm, normal heart sounds and intact distal pulses. Exam reveals no gallop.  No murmur heard. Pulses:      Carotid pulses are on the right side with bruit and on the left side with bruit. Pulmonary/Chest: Effort normal and breath sounds normal.  Abdominal: Soft. Bowel sounds are normal.  Musculoskeletal: Normal range of motion.        General: No edema.  Neurological: She is alert.  Skin: Skin is warm and dry.  Psychiatric: She has a normal mood and affect.       Assessment & Recommendations:  1. Angina pectoris (HCC) She continues to have exertional chest pain that is limiting her activities and is nitroglycerin responsive. Although she did not have any ischemic changes by stress test, in view of EKG changes and her symptoms, feel that she should further be evaluated by coronary angiogram. This was discussed in detail with the patient and her husband. Schedule for cardiac catheterization, and possible angioplasty. We discussed regarding risks, benefits, alternatives to this including stress testing, CTA and continued medical therapy. Patient wants to proceed. Understands <1-2% risk of death, stroke, MI, urgent CABG, bleeding, infection, renal failure but not limited to these. Continue with statin, ASA, and metoprolol.    2. Abnormal nuclear stress test As discussed above, had concerning symptoms and EKG changes during stress test and will further evaluate with cath.  3. Bilateral carotid bruits Carotid duplex was discussed in detail. Asymptomatic. Continue with ASA and statin therapy. Will need repeat carotid duplex in 6 months.   4. Mixed hyperlipidemia On statin. Will likely also need addition of Zetia. Will repeat lipids in the next few weeks  for follow up.Will arrange after procedure.  I will see her back after the cath for further recommendations and reevaluation.   Altamese Hoquiam, MSN, APRN, FNP-C [facility] Office: [phone] Fax: [phone]

## 2018-11-14 ENCOUNTER — Encounter: Payer: Self-pay | Admitting: Cardiology

## 2018-11-14 DIAGNOSIS — E782 Mixed hyperlipidemia: Secondary | ICD-10-CM | POA: Insufficient documentation

## 2018-11-14 DIAGNOSIS — I209 Angina pectoris, unspecified: Secondary | ICD-10-CM | POA: Insufficient documentation

## 2018-11-14 DIAGNOSIS — R9439 Abnormal result of other cardiovascular function study: Secondary | ICD-10-CM | POA: Insufficient documentation

## 2018-11-15 ENCOUNTER — Ambulatory Visit: Payer: Self-pay | Admitting: Cardiology

## 2018-11-15 ENCOUNTER — Other Ambulatory Visit: Payer: Self-pay | Admitting: Cardiology

## 2018-11-15 DIAGNOSIS — R9439 Abnormal result of other cardiovascular function study: Secondary | ICD-10-CM

## 2018-11-15 NOTE — Progress Notes (Signed)
Cardiology Office Note   Date:  11/16/2018   ID:  Diane Richardson, DOB [birth year], MRN [id number]  PCP:  Carol Ada, MD  Cardiologist:   No primary care provider on file. Referring:  Self  Chief Complaint  Patient presents with  . Chest Pain      History of Present Illness: Diane Richardson is a 68 y.o. female who presents for a second opinion regarding chest discomfort.  She is being set up for cardiac catheterization.  She saw another cardiologist in a different group.  She reports that she has been getting chest discomfort that started in January.  She noticed it when she was out walking in [facility] and she would go up a slight incline.  She would get chest discomfort that was upper chest and into her left arm slightly.  It was a burning discomfort.  It would be 6 out of 10 in intensity.  If she started to go downhill it would ease off.  This was new when she never had this before.  She has not pushed herself to do that kind of activity since she started noticing is happening.  She is not getting it with rest.  She would have associated shortness of breath but no nausea vomiting or diaphoresis.  She was not reporting palpitations, presyncope or syncope.  She had no prior cardiac history other than a stress test in 2009.  She does have vascular disease with carotid stenosis as described below.  She had a work-up that included an echocardiogram demonstrating a preserved ejection fraction.  She had moderate to mild mitral regurgitation.  She had a stress test and she was able to walk for 5 minutes.  She did get chest discomfort relieved with rest.  Her TID was mildly increased.  There was no regional differences in tracer uptake.  However there were apparently some EKG changes although I cannot review these tracings.  Because of the intermediate risk findings and the high risk nature of her symptoms which would represent new onset exertional or unstable angina she was being considered for  catheterization and wanted to talk about this with another provider first.   Past Medical History:  Diagnosis Date  . ADD (attention deficit disorder)   . Anxiety   . Depression   . DVT (deep venous thrombosis) ([place])   . Fatigue    chronic  . Fibromyalgia   . Muscle spasm    bad  . PE (pulmonary embolism)   . PTSD (post-traumatic stress disorder)     Past Surgical History:  Procedure Laterality Date  . BUNIONECTOMY    . childbirth  1989   x1,NVD  . FOOT SURGERY Right    pt states she had bunion surgery right foot.  Marland Kitchen HAND SURGERY    . PULMONARY EMBOLISM SURGERY  1983   x9 days     Current Outpatient Medications  Medication Sig Dispense Refill  . aspirin EC 81 MG tablet Take 1 tablet (81 mg total) by mouth daily.    . celecoxib (CELEBREX) 200 MG capsule TAKE 1 CAPSULE(200 MG) BY MOUTH DAILY AS NEEDED (Patient taking differently: as needed. ) 30 capsule 0  . Coenzyme Q10 (CO Q 10 PO) Take 200 mg by mouth daily.    . DULoxetine HCl (CYMBALTA PO) Take 120 mg by mouth daily.    Marland Kitchen LORazepam (ATIVAN) 0.5 MG tablet Take 1 tablet (0.5 mg total) by mouth 2 (two) times daily. (Patient taking differently: Take  0.5 mg by mouth daily. ) 30 tablet 0  . metoprolol succinate (TOPROL-XL) 25 MG 24 hr tablet Take 1 tablet (25 mg total) by mouth at bedtime. 90 tablet 1  . Multiple Vitamins-Minerals (VITAMIN D3 COMPLETE PO) Take 100 mg by mouth daily.    Marland Kitchen omeprazole (PRILOSEC) 40 MG capsule Take 40 mg by mouth daily.    . QUEtiapine (SEROQUEL) 50 MG tablet Take 50 mg by mouth at bedtime.    . simvastatin (ZOCOR) 40 MG tablet Take 1 tablet (40 mg total) by mouth at bedtime. 90 tablet 1   No current facility-administered medications for this visit.     Allergies:   Codeine; Penicillins; Percocet [oxycodone-acetaminophen]; Percodan [oxycodone-aspirin]; and Donnatal [belladonna alk-phenobarb er]    Social History:  The patient  reports that she has never smoked. She has never used smokeless  tobacco. She reports current alcohol use. She reports that she does not use drugs.   Family History:  The patient's family history includes Arthritis in her brother; CAD in her father and paternal grandmother; Cancer in her maternal grandmother and mother; Colon polyps in her maternal grandfather; Dementia in her maternal grandfather and paternal grandfather; Diabetes in her maternal grandmother and paternal grandmother; Heart disease in her father; Hyperlipidemia in her father; Hypertension in her father; Multiple myeloma in her father; Peripheral vascular disease (age of onset: 20) in her mother.    ROS:  Please see the history of present illness.   Otherwise, review of systems are positive for none.   All other systems are reviewed and negative.    PHYSICAL EXAM: VS:  BP (!) 108/58 (BP Location: Left Arm, Patient Position: Sitting, Cuff Size: Normal)   Pulse 70   Ht 5' 2"  (1.575 m)   Wt 138 lb (62.6 kg)   LMP  (LMP Unknown)   BMI 25.24 kg/m  , BMI Body mass index is 25.24 kg/m. GENERAL:  Well appearing HEENT:  Pupils equal round and reactive, fundi not visualized, oral mucosa unremarkable NECK:  No jugular venous distention, waveform within normal limits, carotid upstroke brisk and symmetric, positive bilateral bruits, no thyromegaly LYMPHATICS:  No cervical, inguinal adenopathy LUNGS:  Clear to auscultation bilaterally BACK:  No CVA tenderness CHEST:  Unremarkable HEART:  PMI not displaced or sustained,S1 and S2 within normal limits, no S3, no S4, no clicks, no rubs, no murmurs ABD:  Flat, positive bowel sounds normal in frequency in pitch, no bruits, no rebound, no guarding, no midline pulsatile mass, no hepatomegaly, no splenomegaly EXT:  2 plus pulses throughout, no edema, no cyanosis no clubbing SKIN:  No rashes no nodules NEURO:  Cranial nerves II through XII grossly intact, motor grossly intact throughout PSYCH:  Cognitively intact, oriented to person place and time    EKG:   EKG is ordered today. The ekg ordered today demonstrates sinus rhythm, rate 70, axis within normal limits, intervals within normal limits, no acute ST-T wave changes.   Recent Labs: 03/01/2018: ALT 13; BUN 26; Creat 0.85; Hemoglobin 13.0; Platelets 244; Potassium 4.6; Sodium 139    Lipid Panel No results found for: CHOL, TRIG, HDL, CHOLHDL, VLDL, LDLCALC, LDLDIRECT    Wt Readings from Last 3 Encounters:  11/16/18 138 lb (62.6 kg)  11/12/18 138 lb 6.4 oz (62.8 kg)  03/01/18 129 lb (58.5 kg)      Other studies Reviewed: Additional studies/ records that were reviewed today include: Dr. Einar Gip office record. Review of the above records demonstrates:  Please see elsewhere in  the note.     ASSESSMENT AND PLAN:  UNSTABLE ANGINA: The patient has symptoms consistent with unstable angina.  She had an intermediate risk perfusion study and I think she is high risk for obstructive coronary disease.  I agree with cardiac catheterization. The patient understands that risks included but are not limited to stroke (1 in 1000), death (1 in 60), kidney failure [usually temporary] (1 in 500), bleeding (1 in 200), allergic reaction [possibly serious] (1 in 200).  The patient understands and agrees to proceed.   She will have this performed as scheduled by Dr. Einar Gip  CAROTID STENOSIS: She was found to have 50 to 69% left carotid stenosis there is apparently some mild right carotid stenosis.  She needs to have aggressive risk reduction and continued follow-up of this.  DYSLIPIDEMIA: She was recently started on simvastatin.  LDL was 186 with an HDL of 79.  I would suggest at least moderate or high-dose statin to include Crestor 20 to 40 mg or Lipitor 40 to 80 mg but will defer to her primary cardiologist.   Current medicines are reviewed at length with the patient today.  The patient does not have concerns regarding medicines.  The following changes have been made:  no change  Labs/ tests ordered today  include: None No orders of the defined types were placed in this encounter.    Disposition:   FU with Dr. Einar Gip    Signed, Minus Breeding, MD  11/16/2018 5:44 PM    [place]

## 2018-11-16 ENCOUNTER — Ambulatory Visit: Payer: Medicare Other | Admitting: Cardiology

## 2018-11-16 ENCOUNTER — Encounter: Payer: Self-pay | Admitting: Cardiology

## 2018-11-16 VITALS — BP 108/58 | HR 70 | Ht 62.0 in | Wt 138.0 lb

## 2018-11-16 DIAGNOSIS — I6523 Occlusion and stenosis of bilateral carotid arteries: Secondary | ICD-10-CM | POA: Diagnosis not present

## 2018-11-16 DIAGNOSIS — E785 Hyperlipidemia, unspecified: Secondary | ICD-10-CM | POA: Diagnosis not present

## 2018-11-16 DIAGNOSIS — I200 Unstable angina: Secondary | ICD-10-CM

## 2018-11-16 LAB — CBC WITH DIFFERENTIAL/PLATELET
BASOS ABS: 0.1 10*3/uL (ref 0.0–0.2)
BASOS: 1 %
EOS (ABSOLUTE): 0.4 10*3/uL (ref 0.0–0.4)
EOS: 7 %
Hematocrit: 36.5 % (ref 34.0–46.6)
Hemoglobin: 12.7 g/dL (ref 11.1–15.9)
IMMATURE GRANS (ABS): 0 10*3/uL (ref 0.0–0.1)
IMMATURE GRANULOCYTES: 0 %
LYMPHS: 25 %
Lymphocytes Absolute: 1.3 10*3/uL (ref 0.7–3.1)
MCH: 31.7 pg (ref 26.6–33.0)
MCHC: 34.8 g/dL (ref 31.5–35.7)
MCV: 91 fL (ref 79–97)
Monocytes Absolute: 0.5 10*3/uL (ref 0.1–0.9)
Monocytes: 9 %
NEUTROS PCT: 58 %
Neutrophils Absolute: 3 10*3/uL (ref 1.4–7.0)
PLATELETS: 247 10*3/uL (ref 150–450)
RBC: 4.01 x10E6/uL (ref 3.77–5.28)
RDW: 12.3 % (ref 11.7–15.4)
WBC: 5.2 10*3/uL (ref 3.4–10.8)

## 2018-11-16 LAB — BASIC METABOLIC PANEL
BUN/Creatinine Ratio: 34 — ABNORMAL HIGH (ref 12–28)
BUN: 25 mg/dL (ref 8–27)
CHLORIDE: 104 mmol/L (ref 96–106)
CO2: 20 mmol/L (ref 20–29)
Calcium: 9.4 mg/dL (ref 8.7–10.3)
Creatinine, Ser: 0.73 mg/dL (ref 0.57–1.00)
GFR calc Af Amer: 99 mL/min/{1.73_m2} (ref >59)
GFR calc non Af Amer: 86 mL/min/{1.73_m2} (ref >59)
GLUCOSE: 100 mg/dL — AB (ref 65–99)
Potassium: 4.6 mmol/L (ref 3.5–5.2)
SODIUM: 139 mmol/L (ref 134–144)

## 2018-11-16 NOTE — Patient Instructions (Signed)

## 2018-11-17 NOTE — Progress Notes (Signed)
CBC and CMP are all normal.

## 2018-11-17 NOTE — H&P (View-Only) (Signed)
CBC and CMP are all normal.

## 2018-11-23 ENCOUNTER — Ambulatory Visit (HOSPITAL_COMMUNITY)
Admission: RE | Admit: 2018-11-23 | Discharge: 2018-11-23 | Disposition: A | Discharge status: Home / Self Care | Payer: Medicare Other | Attending: Cardiology | Admitting: Cardiology

## 2018-11-23 ENCOUNTER — Other Ambulatory Visit (HOSPITAL_COMMUNITY): Payer: Self-pay | Admitting: Cardiology

## 2018-11-23 ENCOUNTER — Encounter (HOSPITAL_COMMUNITY)
Admission: RE | Disposition: A | Discharge status: Home / Self Care | Payer: Self-pay | Source: Home / Self Care | Attending: Cardiology

## 2018-11-23 ENCOUNTER — Other Ambulatory Visit: Payer: Self-pay

## 2018-11-23 DIAGNOSIS — R0989 Other specified symptoms and signs involving the circulatory and respiratory systems: Secondary | ICD-10-CM | POA: Diagnosis not present

## 2018-11-23 DIAGNOSIS — Z7982 Long term (current) use of aspirin: Secondary | ICD-10-CM | POA: Diagnosis not present

## 2018-11-23 DIAGNOSIS — Z8249 Family history of ischemic heart disease and other diseases of the circulatory system: Secondary | ICD-10-CM | POA: Diagnosis not present

## 2018-11-23 DIAGNOSIS — E782 Mixed hyperlipidemia: Secondary | ICD-10-CM | POA: Insufficient documentation

## 2018-11-23 DIAGNOSIS — I25119 Atherosclerotic heart disease of native coronary artery with unspecified angina pectoris: Secondary | ICD-10-CM | POA: Insufficient documentation

## 2018-11-23 DIAGNOSIS — Z79899 Other long term (current) drug therapy: Secondary | ICD-10-CM | POA: Insufficient documentation

## 2018-11-23 DIAGNOSIS — I209 Angina pectoris, unspecified: Secondary | ICD-10-CM | POA: Diagnosis present

## 2018-11-23 DIAGNOSIS — R5382 Chronic fatigue, unspecified: Secondary | ICD-10-CM | POA: Insufficient documentation

## 2018-11-23 DIAGNOSIS — R9439 Abnormal result of other cardiovascular function study: Secondary | ICD-10-CM | POA: Insufficient documentation

## 2018-11-23 DIAGNOSIS — Z86711 Personal history of pulmonary embolism: Secondary | ICD-10-CM | POA: Diagnosis not present

## 2018-11-23 DIAGNOSIS — I25110 Atherosclerotic heart disease of native coronary artery with unstable angina pectoris: Secondary | ICD-10-CM | POA: Diagnosis not present

## 2018-11-23 DIAGNOSIS — Z955 Presence of coronary angioplasty implant and graft: Secondary | ICD-10-CM

## 2018-11-23 DIAGNOSIS — Z86718 Personal history of other venous thrombosis and embolism: Secondary | ICD-10-CM | POA: Insufficient documentation

## 2018-11-23 DIAGNOSIS — M797 Fibromyalgia: Secondary | ICD-10-CM | POA: Insufficient documentation

## 2018-11-23 HISTORY — PX: LEFT HEART CATH AND CORONARY ANGIOGRAPHY: CATH118249

## 2018-11-23 HISTORY — PX: CORONARY STENT INTERVENTION: CATH118234

## 2018-11-23 LAB — POCT ACTIVATED CLOTTING TIME
Activated Clotting Time: 279 seconds
Activated Clotting Time: 401 seconds

## 2018-11-23 SURGERY — LEFT HEART CATH AND CORONARY ANGIOGRAPHY
Anesthesia: LOCAL

## 2018-11-23 MED ORDER — NITROGLYCERIN 1 MG/10 ML FOR IR/CATH LAB
INTRA_ARTERIAL | Status: DC | PRN
  Administered 2018-11-23 (×3): 200 ug via INTRACORONARY

## 2018-11-23 MED ORDER — SODIUM CHLORIDE 0.9 % WEIGHT BASED INFUSION
3.0000 mL/kg/h | INTRAVENOUS | Status: DC
  Administered 2018-11-23: 3 mL/kg/h via INTRAVENOUS

## 2018-11-23 MED ORDER — IOHEXOL 350 MG/ML SOLN
INTRAVENOUS | Status: DC | PRN
  Administered 2018-11-23: 100 mL via INTRA_ARTERIAL

## 2018-11-23 MED ORDER — CLOPIDOGREL BISULFATE 300 MG PO TABS
ORAL_TABLET | ORAL | Status: AC
  Filled 2018-11-23: qty 2

## 2018-11-23 MED ORDER — MIDAZOLAM HCL 2 MG/2ML IJ SOLN
INTRAMUSCULAR | Status: DC | PRN
  Administered 2018-11-23: 0.5 mg via INTRAVENOUS

## 2018-11-23 MED ORDER — HEPARIN SODIUM (PORCINE) 1000 UNIT/ML IJ SOLN
INTRAMUSCULAR | Status: DC | PRN
  Administered 2018-11-23: 2000 [IU] via INTRAVENOUS
  Administered 2018-11-23 (×2): 3000 [IU] via INTRAVENOUS

## 2018-11-23 MED ORDER — CLOPIDOGREL BISULFATE 75 MG PO TABS: 75.0000 mg | ORAL_TABLET | Freq: Every day | ORAL | 0 refills | Status: DC

## 2018-11-23 MED ORDER — HEPARIN (PORCINE) IN NACL 1000-0.9 UT/500ML-% IV SOLN
INTRAVENOUS | Status: AC
  Filled 2018-11-23: qty 1000

## 2018-11-23 MED ORDER — SODIUM CHLORIDE 0.9% FLUSH: 3.0000 mL | Freq: Two times a day (BID) | INTRAVENOUS | Status: DC

## 2018-11-23 MED ORDER — ACETAMINOPHEN 325 MG PO TABS
650.0000 mg | ORAL_TABLET | Freq: Four times a day (QID) | ORAL | Status: DC | PRN
  Administered 2018-11-23: 650 mg via ORAL
  Filled 2018-11-23: qty 2

## 2018-11-23 MED ORDER — FENTANYL CITRATE (PF) 100 MCG/2ML IJ SOLN
INTRAMUSCULAR | Status: DC | PRN
  Administered 2018-11-23: 25 ug via INTRAVENOUS

## 2018-11-23 MED ORDER — SODIUM CHLORIDE 0.9 % IV SOLN: 250.0000 mL | INTRAVENOUS | Status: DC | PRN

## 2018-11-23 MED ORDER — FENTANYL CITRATE (PF) 100 MCG/2ML IJ SOLN
INTRAMUSCULAR | Status: AC
  Filled 2018-11-23: qty 2

## 2018-11-23 MED ORDER — NITROGLYCERIN 1 MG/10 ML FOR IR/CATH LAB
INTRA_ARTERIAL | Status: AC
  Filled 2018-11-23: qty 10

## 2018-11-23 MED ORDER — HEPARIN (PORCINE) IN NACL 1000-0.9 UT/500ML-% IV SOLN
INTRAVENOUS | Status: DC | PRN
  Administered 2018-11-23: 500 mL

## 2018-11-23 MED ORDER — MIDAZOLAM HCL 2 MG/2ML IJ SOLN
INTRAMUSCULAR | Status: AC
  Filled 2018-11-23: qty 2

## 2018-11-23 MED ORDER — SODIUM CHLORIDE 0.9% FLUSH: 3.0000 mL | INTRAVENOUS | Status: DC | PRN

## 2018-11-23 MED ORDER — HEPARIN SODIUM (PORCINE) 1000 UNIT/ML IJ SOLN
INTRAMUSCULAR | Status: AC
  Filled 2018-11-23: qty 1

## 2018-11-23 MED ORDER — SODIUM CHLORIDE 0.9 % WEIGHT BASED INFUSION
1.0000 mL/kg/h | INTRAVENOUS | Status: DC
  Administered 2018-11-23: 15.958 mL/kg/h via INTRAVENOUS

## 2018-11-23 MED ORDER — HYDRALAZINE HCL 20 MG/ML IJ SOLN: 5.0000 mg | INTRAMUSCULAR | Status: DC | PRN

## 2018-11-23 MED ORDER — CLOPIDOGREL BISULFATE 75 MG PO TABS: 75.0000 mg | ORAL_TABLET | Freq: Every day | ORAL | 1 refills | Status: DC

## 2018-11-23 MED ORDER — LIDOCAINE HCL (PF) 1 % IJ SOLN
INTRAMUSCULAR | Status: DC | PRN
  Administered 2018-11-23: 2 mL

## 2018-11-23 MED ORDER — CLOPIDOGREL BISULFATE 75 MG PO TABS: 75.0000 mg | ORAL_TABLET | Freq: Every day | ORAL | 1 refills | Status: AC

## 2018-11-23 MED ORDER — CLOPIDOGREL BISULFATE 300 MG PO TABS
ORAL_TABLET | ORAL | Status: DC | PRN
  Administered 2018-11-23: 600 mg via ORAL

## 2018-11-23 MED ORDER — VERAPAMIL HCL 2.5 MG/ML IV SOLN
INTRAVENOUS | Status: AC
  Filled 2018-11-23: qty 2

## 2018-11-23 MED ORDER — ASPIRIN 81 MG PO CHEW: 81.0000 mg | CHEWABLE_TABLET | ORAL | Status: DC

## 2018-11-23 MED ORDER — SODIUM CHLORIDE 0.9 % IV SOLN: INTRAVENOUS | Status: AC

## 2018-11-23 MED ORDER — ONDANSETRON HCL 4 MG/2ML IJ SOLN: 4.0000 mg | Freq: Four times a day (QID) | INTRAMUSCULAR | Status: DC | PRN

## 2018-11-23 MED ORDER — LIDOCAINE HCL (PF) 1 % IJ SOLN
INTRAMUSCULAR | Status: AC
  Filled 2018-11-23: qty 30

## 2018-11-23 MED ORDER — VERAPAMIL HCL 2.5 MG/ML IV SOLN
INTRA_ARTERIAL | Status: DC | PRN
  Administered 2018-11-23: 5 mL via INTRA_ARTERIAL

## 2018-11-23 MED FILL — CLOPIDOGREL 75 MG TABLET: 75 | 90 days supply | Qty: 90 | Fill #0 | Status: TO

## 2018-11-23 SURGICAL SUPPLY — 15 items
BALLN SAPPHIRE ~~LOC~~ 2.5X12 (BALLOONS) ×1 IMPLANT
CATH LAUNCHER 5F EBU3.5 (CATHETERS) ×1 IMPLANT
CATH OPTITORQUE TIG 4.0 5F (CATHETERS) ×1 IMPLANT
DEVICE RAD COMP TR BAND LRG (VASCULAR PRODUCTS) ×1 IMPLANT
GLIDESHEATH SLEND A-KIT 6F 20G (SHEATH) ×1 IMPLANT
GUIDEWIRE INQWIRE 1.5J.035X260 (WIRE) IMPLANT
INQWIRE 1.5J .035X260CM (WIRE) ×2
KIT ENCORE 26 ADVANTAGE (KITS) ×2 IMPLANT
KIT HEART LEFT (KITS) ×2 IMPLANT
PACK CARDIAC CATHETERIZATION (CUSTOM PROCEDURE TRAY) ×2 IMPLANT
SHEATH PROBE COVER 6X72 (BAG) ×1 IMPLANT
STENT ORSIRO 2.5X15 (Permanent Stent) ×1 IMPLANT
TRANSDUCER W/STOPCOCK (MISCELLANEOUS) ×2 IMPLANT
TUBING CIL FLEX 10 FLL-RA (TUBING) ×2 IMPLANT
WIRE COUGAR XT STRL 190CM (WIRE) ×1 IMPLANT

## 2018-11-23 NOTE — Progress Notes (Signed)
CARDIAC REHAB PHASE I   Pt and husband educated on importance of ASA, Plavix, and NTg. Pt given stent card and heart healthy diet. Reviewed restrictions and exercise guidelines. Will refer to CRP II [place].  [id number] Reynold Bowen, RN BSN 11/23/2018 2:02 PM

## 2018-11-23 NOTE — Interval H&P Note (Signed)
History and Physical Interval Note:  11/23/2018 10:00 AM  Diane Richardson  has presented today for surgery, with the diagnosis of Angina.  The various methods of treatment have been discussed with the patient and family. After consideration of risks, benefits and other options for treatment, the patient has consented to  Procedure(s): LEFT HEART CATH AND CORONARY ANGIOGRAPHY (N/A) and possible angioplasty as a surgical intervention.  The patient's history has been reviewed, patient examined, no change in status, stable for surgery.  I have reviewed the patient's chart and labs.  Questions were answered to the patient's satisfaction.   Symptom Status: Ischemic Symptoms Non-invasive Testing: High risk If no or indeterminate stress test, FFR/iFR results in all diseased vessels: N/A Diabetes Mellitus: No S/P CABG: No Antianginal therapy (number of long-acting drugs): >=2 Patient undergoing renal transplant: No Patient undergoing percutaneous valve procedure: No   1 Vessel Disease No proximal LAD involvement, No proximal left dominant LCX involvement  PCI: A (8);  Indication 2  CABG: M (6);  Indication 2 Proximal left dominant LCX involvement  PCI: A (8);  Indication 5  CABG: A (8);  Indication 5 Proximal LAD involvement  PCI: A (8);  Indication 5  CABG: A (8);  Indication 5  2 Vessel Disease No proximal LAD involvement  PCI: A (8);  Indication 8  CABG: A (7);  Indication 8 Proximal LAD involvement  PCI: A (8);  Indication 11  CABG: A (8);  Indication 11  3 Vessel Disease Low disease complexity (e.g., focal stenoses, SYNTAX <=22)  PCI: A (8);  Indication 17  CABG: A (8);  Indication 17 Intermediate or high disease complexity (e.g., SYNTAX >=23)  PCI: M (6);  Indication 21  CABG: A (9);  Indication 21  Left Main Disease Isolated LMCA disease: ostial or midshaft  PCI: A (7);  Indication 24  CABG: A (9);  Indication 24 Isolated LMCA disease: bifurcation involvement  PCI: M  (6);  Indication 25  CABG: A (9);  Indication 25 LMCA ostial or midshaft, concurrent low disease burden multivessel disease (e.g., 1-2 additional focal stenoses, SYNTAX <=22)  PCI: A (7);  Indication 26  CABG: A (9);  Indication 26 LMCA ostial or midshaft, concurrent intermediate or high disease burden multivessel disease (e.g., 1-2 additional bifurcation stenoses, long stenoses, SYNTAX >=23)  PCI: M (4);  Indication 27  CABG: A (9);  Indication 27 LMCA bifurcation involvement, concurrent low disease burden multivessel disease (e.g., 1-2 additional focal stenoses, SYNTAX <=22)  PCI: M (6);  Indication 28  CABG: A (9);  Indication 28 LMCA bifurcation involvement, concurrent intermediate or high disease burden multivessel disease (e.g., 1-2 additional bifurcation stenoses, long stenoses, SYNTAX >=23)  PCI: R (3);  Indication 29  CABG: A (9);  Indication 29  Notes:  A indicates appropriate. M indicates may be appropriate. R indicates rarely appropriate. Number in parentheses is median score for that indication. Reclassify indicates number of functionally diseased vessels should be decreased given negative FFR/iFR. Re-evaluate the scenario interpreting any FFR/iFR negative vessel as being not significantly stenosed.  Disease means involved vessel provides flow to a sufficient amount of myocardium to be clinically important.  If FFR testing indicates a vessel is not significant, that vessel should not be considered diseased (and the patient should be reclassified with respect to extent of functionally significant disease).  Proximal LAD + proximal left dominant LCX is considered 3 vessel CAD  2 Vessel CAD with FFR/iFR abnormal in only 1 but not both is  considered 1 vessel CAD  Disease complexity includes occlusion, bifurcation, trifurcation, ostial, >20 mm, tortuosity, calcification, thrombus  LMCA disease is >=50% by angiography, MLD <2.8 mm, MLA <6 mm2; MLA 6-7.5 mm2 requires further  physiologic  See Table B for risk stratification based on noninvasive testing  Journal of the [employer] of Cardiology Mar 2017, 23391; DOI: 10.1016/j.[email or web address] [email or web address].full-text.pdf This App  2018 by the Society for Cardiovascular Angiography and Interventions   Adrian Prows

## 2018-11-23 NOTE — Discharge Instructions (Signed)
Drink plenty of water over next 48 hours and keep right wrist elevated at heart level for 24 hours.  Radial Site Care  This sheet gives you information about how to care for yourself after your procedure. Your health care provider may also give you more specific instructions. If you have problems or questions, contact your health care provider. What can I expect after the procedure? After the procedure, it is common to have:  Bruising and tenderness at the catheter insertion area. Follow these instructions at home: Medicines  Take over-the-counter and prescription medicines only as told by your health care provider. Insertion site care  Follow instructions from your health care provider about how to take care of your insertion site. Make sure you: ? Wash your hands with soap and water before you change your bandage (dressing). If soap and water are not available, use hand sanitizer. ? Remove your dressing as told by your health care provider. In 24-48 hours  Check your insertion site every day for signs of infection. Check for: ? Redness, swelling, or pain. ? Fluid or blood. ? Pus or a bad smell. ? Warmth.  Do not take baths, swim, or use a hot tub until your health care provider approves.  You may shower 24-48 hours after the procedure, or as directed by your health care provider. ? Remove the dressing and gently wash the site with plain soap and water. ? Pat the area dry with a clean towel. ? Do not rub the site. That could cause bleeding.  Do not apply powder or lotion to the site. Activity   For 24 hours after the procedure, or as directed by your health care provider: ? Do not flex or bend the affected arm. ? Do not push or pull heavy objects with the affected arm. ? Do not drive yourself home from the hospital or clinic. You may drive 24 hours after the procedure unless your health care provider tells you not to. ? Do not operate machinery or power tools.  Do not lift  anything that is heavier than 5 lb, or the limit that you are told, until your health care provider says that it is safe. For 5 days  Ask your health care provider when it is okay to: ? Return to work or school. ? Resume usual physical activities or sports. ? Resume sexual activity. General instructions  If the catheter site starts to bleed, raise your arm and put firm pressure on the site. If the bleeding does not stop, get help right away. This is a medical emergency.  If you went home on the same day as your procedure, a responsible adult should be with you for the first 24 hours after you arrive home.  Keep all follow-up visits as told by your health care provider. This is important. Contact a health care provider if:  You have a fever.  You have redness, swelling, or yellow drainage around your insertion site. Get help right away if:  You have unusual pain at the radial site.  The catheter insertion area swells very fast.  The insertion area is bleeding, and the bleeding does not stop when you hold steady pressure on the area.  Your arm or hand becomes pale, cool, tingly, or numb. These symptoms may represent a serious problem that is an emergency. Do not wait to see if the symptoms will go away. Get medical help right away. Call your local emergency services (911 in the [place] Do not drive yourself  to the hospital. Summary  After the procedure, it is common to have bruising and tenderness at the site.  Follow instructions from your health care provider about how to take care of your radial site wound. Check the wound every day for signs of infection.  Do not lift anything that is heavier than 10 lb (4.5 kg), or the limit that you are told, until your health care provider says that it is safe. This information is not intended to replace advice given to you by your health care provider. Make sure you discuss any questions you have with your health care provider. Document  Released: 09/27/2010 Document Revised: 09/30/2017 Document Reviewed: 09/30/2017 Elsevier Interactive Patient Education  2019 [employer].

## 2018-11-24 ENCOUNTER — Telehealth (HOSPITAL_COMMUNITY): Payer: Self-pay

## 2018-11-24 ENCOUNTER — Encounter (HOSPITAL_COMMUNITY): Payer: Self-pay | Admitting: Cardiology

## 2018-11-24 NOTE — Telephone Encounter (Signed)
Pt insurance is active and benefits verified through [place] $20.00, DED 0/0 met, out of pocket $4,000/$518.85 met, co-insurance 0. no pre-authorization required. Passport, 11/24/2018 @ 11:19am, REF# [id number]  Will contact patient to see if she is interested in the Cardiac Rehab Program. If interested, patient will need to complete follow up appt. Once completed, patient will be contacted for scheduling upon review by the RN Navigator. Tedra Senegal. Support Rep II

## 2018-11-24 NOTE — Telephone Encounter (Signed)
Called patient to see if she is interested in the Cardiac Rehab Program. Patient expressed interest. Explained scheduling process and went over insurance, patient verbalized understanding. Will contact patient for scheduling once f/u has been completed. °Gloria W. Support Rep II °

## 2018-12-02 ENCOUNTER — Ambulatory Visit: Payer: Medicare Other | Admitting: Cardiology

## 2018-12-03 ENCOUNTER — Ambulatory Visit: Payer: Medicare Other | Admitting: Cardiology

## 2018-12-07 ENCOUNTER — Other Ambulatory Visit: Payer: Self-pay

## 2018-12-07 ENCOUNTER — Encounter: Payer: Self-pay | Admitting: Cardiology

## 2018-12-07 ENCOUNTER — Ambulatory Visit (INDEPENDENT_AMBULATORY_CARE_PROVIDER_SITE_OTHER): Payer: Medicare Other | Admitting: Cardiology

## 2018-12-07 VITALS — BP 119/75 | HR 71 | Ht 61.0 in | Wt 134.0 lb

## 2018-12-07 DIAGNOSIS — M79622 Pain in left upper arm: Secondary | ICD-10-CM | POA: Diagnosis not present

## 2018-12-07 DIAGNOSIS — E782 Mixed hyperlipidemia: Secondary | ICD-10-CM

## 2018-12-07 DIAGNOSIS — I2510 Atherosclerotic heart disease of native coronary artery without angina pectoris: Secondary | ICD-10-CM | POA: Diagnosis not present

## 2018-12-07 NOTE — Progress Notes (Signed)
Subjective:   Diane Richardson, female    DOB: [birth year], 68 y.o.   MRN: [id number]  Diane Ada, MD  Chief Complaint  Patient presents with  . Follow-up    7-10 day cath   This visit type was conducted due to national recommendations for restrictions regarding the COVID-19 Pandemic (e.g. social distancing).  This format is felt to be most appropriate for this patient at this time.  All issues noted in this document were discussed and addressed.  No physical exam was performed (except for noted visual exam findings with Telehealth visits).  The patient has consented to conduct a Telehealth visit and understands insurance will be billed.    I discussed the limitations of evaluation and management by telemedicine and the availability of in person appointments. The patient expressed understanding and agreed to proceed.  Virtual Visit via Video Note is as below  I connected with Diane Richardson, on 12/07/18 at 1138 by a video enabled telemedicine application and verified that I am speaking with the correct person using two identifiers.     I have discussed with her regarding the safety during COVID Pandemic and steps and precautions including social distancing with the patient.    HPI: Diane Richardson  is a 68 y.o. female  with history of fibromyalgia, depression, bipolar disorder, hyperlipidemia, recently evaluated by [place] for symptoms of angina.  She underwent exercise nuclear stress test on 10/25/2018 with EKG changes, but normal perfusion imaging.  3 times daily was mildly increased at 1.1 and study was considered intermediate risk. Echocardiogram on 10/29/2018 revealed normal LVEF.  She was noted to have greater than 50% stenosis in the right external carotid artery, 50 to 69% stenosis in left internal carotid, mild mixed plaque in the left common carotid by carotid duplex on 10/29/2018.  Due to continued symptoms angina that was activity limiting, underwent coronary angiogram on  11/23/2018 revealing focal high-grade 99% stenosis in the very large OM1, otherwise tortuous LAD and circumflex coronary artery, no significant disease. S/P stenting of OM1 with 2.5 x 15 mm Orsiro DES. She is now on plavix and ASA. She now presents for follow up. Husband is present with her during conference today.  Patient reports that she is doing well, has not had any episodes of chest pain.  She has done some walking that she tolerated well.  She is eager to start yoga and also horseback riding.  She does mention occasional left arm numbness/pain that occurs with activities such as holding her phone.  Denies any neck or back problems.  Pain does not occur with exertion. No issues from right radial access site except for mild bruising.     Past Medical History:  Diagnosis Date  . ADD (attention deficit disorder)   . Anxiety   . Depression   . DVT (deep venous thrombosis) ([place])   . Fatigue    chronic  . Fibromyalgia   . Muscle spasm    bad  . PE (pulmonary embolism)   . PTSD (post-traumatic stress disorder)     Past Surgical History:  Procedure Laterality Date  . BUNIONECTOMY    . CARDIAC CATHETERIZATION    . childbirth  1989   x1,NVD  . CORONARY STENT INTERVENTION N/A 11/23/2018   Procedure: CORONARY STENT INTERVENTION;  Surgeon: Adrian Prows, MD;  Location: [facility];  Service: Cardiovascular;  Laterality: N/A;  OM  . FOOT SURGERY Right    pt states she had bunion  surgery right foot.  Marland Kitchen HAND SURGERY    . LEFT HEART CATH AND CORONARY ANGIOGRAPHY N/A 11/23/2018   Procedure: LEFT HEART CATH AND CORONARY ANGIOGRAPHY;  Surgeon: Adrian Prows, MD;  Location: [facility];  Service: Cardiovascular;  Laterality: N/A;  . PULMONARY EMBOLISM SURGERY  1983   x9 days    Family History  Problem Relation Age of Onset  . Cancer Mother        ovarian   . Peripheral vascular disease Mother 66  . Hyperlipidemia Father   . Hypertension Father   . Multiple myeloma Father   .  CAD Father        CABG age 93  . Heart disease Father        Pacer age 37  . Diabetes Maternal Grandmother   . Cancer Maternal Grandmother        stomach  . Arthritis Brother   . Dementia Maternal Grandfather   . Colon polyps Maternal Grandfather   . CAD Paternal Grandmother   . Diabetes Paternal Grandmother   . Dementia Paternal Grandfather     Social History   Socioeconomic History  . Marital status: Married    Spouse name: Not on file  . Number of children: 1  . Years of education: PHD  . Highest education level: Not on file  Occupational History  . Occupation: disabled    Comment: FORMER TEACHER  Social Needs  . Financial resource strain: Not on file  . Food insecurity:    Worry: Not on file    Inability: Not on file  . Transportation needs:    Medical: Not on file    Non-medical: Not on file  Tobacco Use  . Smoking status: Never Smoker  . Smokeless tobacco: Never Used  Substance and Sexual Activity  . Alcohol use: Yes    Comment: 3 glasses of wine weekly  . Drug use: Never  . Sexual activity: Yes  Lifestyle  . Physical activity:    Days per week: Not on file    Minutes per session: Not on file  . Stress: Not on file  Relationships  . Social connections:    Talks on phone: Not on file    Gets together: Not on file    Attends religious service: Not on file    Active member of club or organization: Not on file    Attends meetings of clubs or organizations: Not on file    Relationship status: Not on file  . Intimate partner violence:    Fear of current or ex partner: Not on file    Emotionally abused: Not on file    Physically abused: Not on file    Forced sexual activity: Not on file  Other Topics Concern  . Not on file  Social History Narrative   Drinks less than one cup of caffeine daily.    Current Meds  Medication Sig  . aspirin EC 81 MG tablet Take 1 tablet (81 mg total) by mouth daily.  Marland Kitchen bismuth subsalicylate (PEPTO BISMOL) 262 MG/15ML  suspension Take 30 mLs by mouth every 6 (six) hours as needed for indigestion.  . celecoxib (CELEBREX) 200 MG capsule TAKE 1 CAPSULE(200 MG) BY MOUTH DAILY AS NEEDED (Patient taking differently: Take 200 mg by mouth daily as needed for moderate pain. )  . Cholecalciferol (VITAMIN D) 50 MCG (2000 UT) CAPS Take 400 Units by mouth daily.   . clopidogrel (PLAVIX) 75 MG tablet Take 1 tablet (75 mg  total) by mouth daily.  . Coenzyme Q10 (COQ-10) 200 MG CAPS Take 200 mg by mouth daily.  . diclofenac sodium (VOLTAREN) 1 % GEL Apply 1 application topically 4 (four) times daily as needed (pain).  . DULoxetine (CYMBALTA) 60 MG capsule Take 120 mg by mouth daily.  Marland Kitchen ketoconazole (NIZORAL) 2 % cream Apply 1 application topically daily as needed for irritation.  Marland Kitchen LORazepam (ATIVAN) 0.5 MG tablet Take 1 tablet (0.5 mg total) by mouth 2 (two) times daily. (Patient taking differently: Take 0.5 mg by mouth See admin instructions. Take 0.5 mg in the morning, may take a second 0.5 mg dose as needed for anxiety)  . Menthol-Zinc Oxide (GOLD BOND EX) Apply 1 application topically daily as needed (pain/itching).  . methocarbamol (ROBAXIN) 500 MG tablet Take 500 mg by mouth at bedtime as needed for muscle spasms.  . metoprolol succinate (TOPROL-XL) 25 MG 24 hr tablet Take 1 tablet (25 mg total) by mouth at bedtime.  . nitroGLYCERIN (NITROSTAT) 0.4 MG SL tablet Place 0.4 mg under the tongue every 5 (five) minutes as needed for chest pain.  Marland Kitchen omeprazole (PRILOSEC) 40 MG capsule Take 40 mg by mouth daily.  . QUEtiapine (SEROQUEL XR) 50 MG TB24 24 hr tablet Take 50 mg by mouth.  . simvastatin (ZOCOR) 40 MG tablet Take 1 tablet (40 mg total) by mouth at bedtime.     Review of Systems  Constitution: Negative. Negative for fever, malaise/fatigue, weight gain and weight loss.  HENT: Negative for sore throat.   Eyes: Negative for visual disturbance.  Cardiovascular: Negative for chest pain, claudication, dyspnea on exertion,  leg swelling, palpitations and syncope.  Respiratory: Negative.  Negative for cough and shortness of breath.   Hematologic/Lymphatic: Negative.   Skin: Negative.   Musculoskeletal: Negative for back pain, joint swelling and muscle weakness.  Gastrointestinal: Negative for abdominal pain, change in bowel habit, nausea and vomiting.  Neurological: Positive for numbness (and pain in left arm with holding her phone). Negative for dizziness, focal weakness and light-headedness.  Psychiatric/Behavioral: Negative for depression and substance abuse.  All other systems reviewed and are negative.      Objective:     Blood pressure 119/75, pulse 71, height _0  (1.549 m), weight 134 lb (60.8 kg).  Cardiac studies:  Coronary angiogram 11/23/2018: Left dominant circulation, focal high-grade 99% stenosis in the very large OM1, otherwise tortuous LAD and circumflex coronary artery, no significant disease. S/P stenting of OM1 with 2.5 x 15 mm Orsiro DES at 11 atmospheric pressure giving a 2.75 mm lumen.  Stenosis reduced from 99% to 0% with maintenance of TIMI-3 to TIMI-3 flow.  EKG 10/06/2018: Normal sinus rhythm at rate of 71 bpm, normal axis, inferior and lateral ST depression, cannot exclude ischemia. Abnormal EKG. No significant change from 10/04/17.  Echocardiogram 10/29/2018: Left ventricle cavity is normal in size. Mild concentric hypertrophy of the left ventricle. Normal global wall motion. Normal diastolic filling pattern. Calculated EF 58%. Mild to moderate mitral regurgitation. Mild tricuspid regurgitation. No evidence of pulmonary hypertension.  Carotid artery duplex 10/29/2018: Minimal mixed plaque in the right ICA. Stenosis in the right external carotid artery (>50%). Stenosis in the left internal carotid artery (50-69%). Mild mixed plaque in the left common carotid artery (<50%). Antegrade right vertebral artery flow. Antegrade left vertebral artery flow. Follow up in six months  is appropriate if clinically indicated.  Exercise sestamibi stress test 10/25/2018: 1. The patient performed treadmill exercise using Bruce protocol, completing 5:03 minutes. The  patient completed an estimated workload of 7 METS, reaching 86% of the maximum predicted heart rate. Exercise capacity was low. Hemodynamic response was normal. Stress symptoms included exercise induced chest pain, relieved with rest.  2. The overall quality of the study is good. There is no evidence of abnormal lung activity. Stress and rest SPECT images demonstrate homogeneous tracer distribution throughout the myocardium. TID was mildly increased at 1.1. Gated SPECT imaging reveals normal myocardial thickening and wall motion. The left ventricular ejection fraction was normal (66%).  3. Intermediate risk study given symptoms and EKG changes. No SPECT evidence of ischemia or infarction see. Clinical correlation recommended.   Recent Labs:  CBC Latest Ref Rng & Units 03/01/2018  WBC 3.8 - 10.8 Thousand/uL 4.2  Hemoglobin 11.7 - 15.5 g/dL 13.0  Hematocrit 35.0 - 45.0 % 38.4  Platelets 140 - 400 Thousand/uL 244   CMP Latest Ref Rng & Units 03/01/2018  Glucose 65 - 99 mg/dL 92  BUN 7 - 25 mg/dL 26(H)  Creatinine 0.50 - 0.99 mg/dL 0.85  Sodium 135 - 146 mmol/L 139  Potassium 3.5 - 5.3 mmol/L 4.6  Chloride 98 - 110 mmol/L 104  CO2 20 - 32 mmol/L 28  Calcium 8.6 - 10.4 mg/dL 10.0  Total Protein 6.1 - 8.1 g/dL 6.8  Total Bilirubin 0.2 - 1.2 mg/dL 0.5  Alkaline Phos 33 - 130 U/L -  AST 10 - 35 U/L 14  ALT 6 - 29 U/L 13     Physical Exam  Constitutional: She is oriented to person, place, and time. She appears well-developed and well-nourished. She is cooperative. No distress.  HENT:  Head: Normocephalic and atraumatic.  Neck: Normal range of motion.  Cardiovascular:  Patient had difficulty with finding right radial pulse that was likely a technique error. No edema, erythema, or evidence of infection was noted.    Pulmonary/Chest: Effort normal.  Neurological: She is alert and oriented to person, place, and time.  Psychiatric: She has a normal mood and affect.  Vitals reviewed.      Assessment & Recommendations:  1. Atherosclerosis of native coronary artery of native heart without angina pectoris Coronary angiogram 11/23/2018: Left dominant circulation, focal high-grade 99% stenosis in the very large OM1, otherwise tortuous LAD and circumflex coronary artery, no significant disease. S/P stenting of OM1 with 2.5 x 15 mm Orsiro DES at 11 atmospheric pressure giving a 2.75 mm lumen.   I have discussed procedure findings with the patient. She has not had any reoccurrence of angina since her procedure; however, activity has been limited. I have advised her to slowly increase her activity level as tolerated with avoiding heavy lifting until 3-4 weeks after the procedure. I would like for her to participate in cardiac rehab and she is interested in this. I will go ahead and place referral although she is aware that these procedures are currently on hold due to COVID 19. Will continue with DAPT for at least 6 months to 1 year. I have advised her that she may resume horseback riding in the next few weeks as tolerated. She is aware of risk of bleeding with being on ASA and plavix, but advised usual precautions.   2. Mixed hyperlipidemia Currently on Zocor and last LDL was 186 in 2018. I will place order for CMP and lipids in 8 weeks to follow up on her lipids. She will likely need addition of Zetia. LDL goal less than 70 given CAD.  3. Left upper arm pain I suspect related  to musculoskeletal etiology as seems to be exacerbated with lifting her arm talking on the phone. Will continue to monitor. If no improvement with regular exercise or if occurs while exercising, advised to contact me.  Plan: I will see her back in 3 months for follow up on CAD and HLD.   Jeri Lager, MSN, APRN, FNP-C [facility]  [facility]iovascular, [place] Office: [phone] Fax: [phone]

## 2018-12-08 ENCOUNTER — Telehealth (HOSPITAL_COMMUNITY): Payer: Self-pay | Admitting: *Deleted

## 2018-12-08 NOTE — Telephone Encounter (Signed)
Left message for pt to call back in regards to referral received for participation in Cardiac Rehab.  Contact information provided. Alanson Aly, BSN Cardiac and Emergency planning/management officer

## 2018-12-16 ENCOUNTER — Telehealth (HOSPITAL_COMMUNITY): Payer: Self-pay | Admitting: *Deleted

## 2018-12-16 NOTE — Telephone Encounter (Signed)
Pt returned call.  Advised pt that we are currently closed to pt due to national recommendations for covid-19.  Pt is interested in scheduling when we reopen. Pt is walking with her husband and is up to 15 minutes a day.  Encouraged pt to increase her time as she is able to tolerate.  Reviewed insurance benefits with pt. Verbalized understanding.  Alanson Aly, BSN Cardiac and Emergency planning/management officer

## 2018-12-29 ENCOUNTER — Telehealth (HOSPITAL_COMMUNITY): Payer: Self-pay | Admitting: Cardiac Rehabilitation

## 2018-12-29 NOTE — Telephone Encounter (Addendum)
Phone call to patient to discuss outpatient cardiac rehab and notify pt department remains closed due to COVID 19 precautions.  Left message on voicemail. Deveron Furlong, RN, BSN Cardiac Pulmonary Rehab

## 2019-02-16 ENCOUNTER — Telehealth (HOSPITAL_COMMUNITY): Payer: Self-pay | Admitting: *Deleted

## 2019-03-04 ENCOUNTER — Telehealth (HOSPITAL_COMMUNITY): Payer: Self-pay

## 2019-03-04 NOTE — Telephone Encounter (Signed)
Pt insurance is active and benefits verified through [facility]. Co-pay $20.00, DED $0.00/$0.00 met, out of pocket $4,000.00/$768.85 met, co-insurance 0%. No pre-authorization required. Passport, 03/04/2019 @ [place], ODQ#[id number]

## 2019-03-15 ENCOUNTER — Telehealth (HOSPITAL_COMMUNITY): Payer: Self-pay

## 2019-03-15 NOTE — Telephone Encounter (Signed)
Phone call to Pt to inquire about Phase II cardiac rehab orientation. Pt did not answer. Message was left for Pt to return call.

## 2019-03-16 ENCOUNTER — Telehealth (HOSPITAL_COMMUNITY): Payer: Self-pay

## 2019-03-17 ENCOUNTER — Telehealth (HOSPITAL_COMMUNITY): Payer: Self-pay

## 2019-03-20 NOTE — Telephone Encounter (Signed)
Patient aware of test results.

## 2019-03-21 ENCOUNTER — Telehealth (HOSPITAL_COMMUNITY): Payer: Self-pay | Admitting: *Deleted

## 2019-03-21 NOTE — Telephone Encounter (Signed)
Cardiac Rehab Medication Review by a Pharmacist  Does the patient  feel that his/her medications are working for him/her?  Yes with current level of limited activity.   Has the patient been experiencing any side effects to the medications prescribed?  Yes. Recent weight gain of 5-6 pounds due to limited activity and is monitoring daily food intake.  Does the patient measure his/her own blood pressure or blood glucose at home?  No. Her husband has a blood pressure monitor but she does not use it.   Does the patient have any problems obtaining medications due to transportation or finances?   no  Understanding of regimen: poor Understanding of indications: poor Potential of compliance: fair   Pharmacist comments: N/A  Cristela Felt, PharmD PGY1 Pharmacy Resident [employer]: [phone]  03/21/2019 3:31 PM

## 2019-03-21 NOTE — Telephone Encounter (Signed)
Called patient to complete her health assessment in preparation for orientation to Cardiac Rehab tomorrow 03/21/2019 @ 1030.

## 2019-03-22 ENCOUNTER — Encounter (HOSPITAL_COMMUNITY): Payer: Self-pay

## 2019-03-22 ENCOUNTER — Other Ambulatory Visit: Payer: Self-pay

## 2019-03-22 ENCOUNTER — Ambulatory Visit (HOSPITAL_COMMUNITY): Payer: Medicare Other

## 2019-03-22 ENCOUNTER — Encounter (HOSPITAL_COMMUNITY)
Admission: RE | Admit: 2019-03-22 | Discharge: 2019-03-22 | Disposition: A | Discharge status: Home / Self Care | Payer: Medicare Other | Source: Ambulatory Visit | Attending: Cardiology | Admitting: Cardiology

## 2019-03-22 VITALS — BP 102/70 | HR 72 | Temp 98.2°F | Ht 60.25 in | Wt 139.1 lb

## 2019-03-22 DIAGNOSIS — Z955 Presence of coronary angioplasty implant and graft: Secondary | ICD-10-CM | POA: Insufficient documentation

## 2019-03-22 HISTORY — DX: Hyperlipidemia, unspecified: E78.5

## 2019-03-22 HISTORY — DX: Atherosclerotic heart disease of native coronary artery without angina pectoris: I25.10

## 2019-03-22 NOTE — Progress Notes (Signed)
Cardiac Individual Treatment Plan  Patient Details  Name: Diane LuxChristie M Richardson MRN: [id number] Date of Birth: 01/18/1951 Referring Provider:     CARDIAC REHAB PHASE II [facility]ATION [facility]  Referring Provider  Dr. Jacinto HalimGanji      Initial Encounter Date:    CARDIAC REHAB PHASE II ORIENTATION [facility] 03/22/2019 [facility]  Date  03/22/19      Visit Diagnosis: Status post coronary artery stent placement 11/23/18 S/P DES OM  Patient's Home Medications on Admission:  Current Outpatient Medications:  .  aspirin EC 81 MG tablet, Take 1 tablet (81 mg total) by mouth daily., Disp: , Rfl:  .  bismuth subsalicylate (PEPTO BISMOL) 262 MG/15ML suspension, Take 30 mLs by mouth every 6 (six) hours as needed for indigestion., Disp: , Rfl:  .  Cholecalciferol (VITAMIN D) 50 MCG (2000 UT) CAPS, Take 400 Units by mouth daily. , Disp: , Rfl:  .  clopidogrel (PLAVIX) 75 MG tablet, Take 1 tablet (75 mg total) by mouth daily., Disp: 90 tablet, Rfl: 1 .  Coenzyme Q10 (COQ-10) 200 MG CAPS, Take 200 mg by mouth daily., Disp: , Rfl:  .  diclofenac sodium (VOLTAREN) 1 % GEL, Apply 1 application topically 4 (four) times daily as needed (pain)., Disp: , Rfl:  .  DULoxetine (CYMBALTA) 60 MG capsule, Take 120 mg by mouth daily., Disp: , Rfl:  .  ketoconazole (NIZORAL) 2 % cream, Apply 1 application topically daily as needed for irritation., Disp: , Rfl:  .  LORazepam (ATIVAN) 0.5 MG tablet, Take 1 tablet (0.5 mg total) by mouth 2 (two) times daily. (Patient taking differently: Take 0.5 mg by mouth See admin instructions. Take 0.5 mg in the morning, may take a second 0.5 mg dose as needed for anxiety), Disp: 30 tablet, Rfl: 0 .  Menthol-Zinc Oxide (GOLD BOND EX), Apply 1 application topically daily as needed (pain/itching)., Disp: , Rfl:  .  methocarbamol (ROBAXIN) 500 MG tablet, Take 500 mg by mouth at bedtime as needed for muscle spasms., Disp: ,  Rfl:  .  metoprolol succinate (TOPROL-XL) 25 MG 24 hr tablet, Take 1 tablet (25 mg total) by mouth at bedtime., Disp: 90 tablet, Rfl: 1 .  nitroGLYCERIN (NITROSTAT) 0.4 MG SL tablet, Place 0.4 mg under the tongue every 5 (five) minutes as needed for chest pain., Disp: , Rfl:  .  omeprazole (PRILOSEC) 40 MG capsule, Take 40 mg by mouth daily., Disp: , Rfl:  .  QUEtiapine (SEROQUEL XR) 50 MG TB24 24 hr tablet, Take 50 mg by mouth., Disp: , Rfl:  .  simvastatin (ZOCOR) 40 MG tablet, Take 1 tablet (40 mg total) by mouth at bedtime., Disp: 90 tablet, Rfl: 1 .  celecoxib (CELEBREX) 200 MG capsule, TAKE 1 CAPSULE(200 MG) BY MOUTH DAILY AS NEEDED (Patient not taking: No sig reported), Disp: 30 capsule, Rfl: 0  Past Medical History: Past Medical History:  Diagnosis Date  . ADD (attention deficit disorder)   . Anxiety   . Coronary artery disease   . Depression   . DVT (deep venous thrombosis) (HCC)   . Fatigue    chronic  . Fibromyalgia   . Hyperlipidemia   . Muscle spasm    bad  . PE (pulmonary embolism)   . PTSD (post-traumatic stress disorder)     Tobacco Use: Social History   Tobacco Use  Smoking Status Never Smoker  Smokeless Tobacco Never Used    Labs:  Recent Review Flowsheet Data    There is no flowsheet data to display.      Capillary Blood Glucose: No results found for: GLUCAP   Exercise Target Goals: Exercise Program Goal: Individual exercise prescription set using results from initial 6 min walk test and THRR while considering  patient's activity barriers and safety.   Exercise Prescription Goal: Starting with aerobic activity 30 plus minutes a day, 3 days per week for initial exercise prescription. Provide home exercise prescription and guidelines that participant acknowledges understanding prior to discharge.  Activity Barriers & Risk Stratification: Activity Barriers & Cardiac Risk Stratification - 03/22/19 1526      Activity Barriers & Cardiac Risk  Stratification   Activity Barriers  History of Falls;Balance Concerns;Joint Problems;Deconditioning;Other (comment)    Comments  Right hip and leg pain    Cardiac Risk Stratification  High       6 Minute Walk: 6 Minute Walk    Row Name 03/22/19 1526         6 Minute Walk   Phase  Initial     Distance  1487 feet     Walk Time  6 minutes     # of Rest Breaks  0     MPH  2.8     METS  3.2     RPE  11     Perceived Dyspnea   0     VO2 Peak  11.46     Symptoms  No     Resting HR  72 bpm     Resting BP  102/70     Resting Oxygen Saturation   95 %     Exercise Oxygen Saturation  during 6 min walk  100 %     Max Ex. HR  98 bpm     Max Ex. BP  133/53     2 Minute Post BP  102/62        Oxygen Initial Assessment:   Oxygen Re-Evaluation:   Oxygen Discharge (Final Oxygen Re-Evaluation):   Initial Exercise Prescription: Initial Exercise Prescription - 03/22/19 1500      Date of Initial Exercise RX and Referring Provider   Date  03/22/19    Referring Provider  Dr. Einar Gip    Expected Discharge Date  05/06/19      NuStep   Level  2    SPM  75    Minutes  15    METs  3      Arm Ergometer   Level  1    Watts  35    Minutes  15    METs  2.8      Prescription Details   Frequency (times per week)  3    Duration  Progress to 30 minutes of continuous aerobic without signs/symptoms of physical distress      Intensity   THRR 40-80% of Max Heartrate  61-122    Ratings of Perceived Exertion  11-13      Progression   Progression  Continue to progress workloads to maintain intensity without signs/symptoms of physical distress.      Resistance Training   Training Prescription  Yes    Weight  2 lbs.     Reps  10-15       Perform Capillary Blood Glucose checks as needed.  Exercise Prescription Changes:   Exercise Comments:   Exercise Goals and Review: Exercise Goals    Row Name 03/22/19 1530  Exercise Goals   Increase Physical Activity  Yes        Intervention  Provide advice, education, support and counseling about physical activity/exercise needs.;Develop an individualized exercise prescription for aerobic and resistive training based on initial evaluation findings, risk stratification, comorbidities and participant's personal goals.       Expected Outcomes  Short Term: Attend rehab on a regular basis to increase amount of physical activity.;Long Term: Add in home exercise to make exercise part of routine and to increase amount of physical activity.;Long Term: Exercising regularly at least 3-5 days a week.       Increase Strength and Stamina  Yes       Intervention  Provide advice, education, support and counseling about physical activity/exercise needs.;Develop an individualized exercise prescription for aerobic and resistive training based on initial evaluation findings, risk stratification, comorbidities and participant's personal goals.       Expected Outcomes  Short Term: Increase workloads from initial exercise prescription for resistance, speed, and METs.;Short Term: Perform resistance training exercises routinely during rehab and add in resistance training at home;Long Term: Improve cardiorespiratory fitness, muscular endurance and strength as measured by increased METs and functional capacity (6MWT)       Able to understand and use rate of perceived exertion (RPE) scale  Yes       Intervention  Provide education and explanation on how to use RPE scale       Expected Outcomes  Short Term: Able to use RPE daily in rehab to express subjective intensity level;Long Term:  Able to use RPE to guide intensity level when exercising independently       Knowledge and understanding of Target Heart Rate Range (THRR)  Yes       Intervention  Provide education and explanation of THRR including how the numbers were predicted and where they are located for reference       Expected Outcomes  Short Term: Able to state/look up THRR;Long Term: Able to  use THRR to govern intensity when exercising independently;Short Term: Able to use daily as guideline for intensity in rehab       Able to check pulse independently  Yes       Intervention  Provide education and demonstration on how to check pulse in carotid and radial arteries.;Review the importance of being able to check your own pulse for safety during independent exercise       Expected Outcomes  Short Term: Able to explain why pulse checking is important during independent exercise;Long Term: Able to check pulse independently and accurately       Understanding of Exercise Prescription  Yes       Intervention  Provide education, explanation, and written materials on patient's individual exercise prescription       Expected Outcomes  Short Term: Able to explain program exercise prescription;Long Term: Able to explain home exercise prescription to exercise independently          Exercise Goals Re-Evaluation :    Discharge Exercise Prescription (Final Exercise Prescription Changes):   Nutrition:  Target Goals: Understanding of nutrition guidelines, daily intake of sodium 1500mg , cholesterol 200mg , calories 30% from fat and 7% or less from saturated fats, daily to have 5 or more servings of fruits and vegetables.  Biometrics: Pre Biometrics - 03/22/19 1532      Pre Biometrics   Height  5' 0.25" (1.53 m)    Weight  63.1 kg    Waist Circumference  32.5 inches  Hip Circumference  41 inches    Waist to Hip Ratio  0.79 %    BMI (Calculated)  26.96    Triceps Skinfold  41 mm    % Body Fat  40.3 %    Grip Strength  24 kg    Flexibility  16.5 in    Single Leg Stand  10.25 seconds        Nutrition Therapy Plan and Nutrition Goals:   Nutrition Assessments:   Nutrition Goals Re-Evaluation:   Nutrition Goals Discharge (Final Nutrition Goals Re-Evaluation):   Psychosocial: Target Goals: Acknowledge presence or absence of significant depression and/or stress, maximize coping  skills, provide positive support system. Participant is able to verbalize types and ability to use techniques and skills needed for reducing stress and depression.  Initial Review & Psychosocial Screening: Initial Psych Review & Screening - 03/22/19 1631      Initial Review   Current issues with  History of Depression      Family Dynamics   Good Support System?  Yes   Diane Richardson has her husband for support   Comments  Diane Richardson has a history of depression takes seroquel and cymbalta      Barriers   Psychosocial barriers to participate in program  Psychosocial barriers identified (see note)      Screening Interventions   Interventions  Encouraged to exercise;Provide feedback about the scores to participant       Quality of Life Scores: Quality of Life - 03/22/19 1533      Quality of Life   Select  Quality of Life      Quality of Life Scores   Health/Function Pre  15.04 %    Socioeconomic Pre  19 %    Psych/Spiritual Pre  18.79 %    Family Pre  22.9 %    GLOBAL Pre  17.86 %      Scores of 19 and below usually indicate a poorer quality of life in these areas.  A difference of  2-3 points is a clinically meaningful difference.  A difference of 2-3 points in the total score of the Quality of Life Index has been associated with significant improvement in overall quality of life, self-image, physical symptoms, and general health in studies assessing change in quality of life.  PHQ-9: Recent Review Flowsheet Data    Depression screen [facility] 2/9 03/22/2019 05/10/2015   Decreased Interest 0 1   Down, Depressed, Hopeless 0 0   PHQ - 2 Score 0 1     Interpretation of Total Score  Total Score Depression Severity:  1-4 = Minimal depression, 5-9 = Mild depression, 10-14 = Moderate depression, 15-19 = Moderately severe depression, 20-27 = Severe depression   Psychosocial Evaluation and Intervention:   Psychosocial Re-Evaluation:   Psychosocial Discharge (Final Psychosocial  Re-Evaluation):   Vocational Rehabilitation: Provide vocational rehab assistance to qualifying candidates.   Vocational Rehab Evaluation & Intervention: Vocational Rehab - 03/22/19 1636      Initial Vocational Rehab Evaluation & Intervention   Assessment shows need for Vocational Rehabilitation  No       Education: Education Goals: Education classes will be provided on a weekly basis, covering required topics. Participant will state understanding/return demonstration of topics presented.  Learning Barriers/Preferences: Learning Barriers/Preferences - 03/22/19 1534      Learning Barriers/Preferences   Learning Barriers  None    Learning Preferences  Audio;Computer/Internet;Group Instruction;Individual Instruction;Skilled Demonstration;Pictoral;Verbal Instruction;Video;Written Material       Education Topics: Hypertension, Hypertension Reduction -Define  heart disease and high blood pressure. Discus how high blood pressure affects the body and ways to reduce high blood pressure.   Exercise and Your Heart -Discuss why it is important to exercise, the FITT principles of exercise, normal and abnormal responses to exercise, and how to exercise safely.   Angina -Discuss definition of angina, causes of angina, treatment of angina, and how to decrease risk of having angina.   Cardiac Medications -Review what the following cardiac medications are used for, how they affect the body, and side effects that may occur when taking the medications.  Medications include Aspirin, Beta blockers, calcium channel blockers, ACE Inhibitors, angiotensin receptor blockers, diuretics, digoxin, and antihyperlipidemics.   Congestive Heart Failure -Discuss the definition of CHF, how to live with CHF, the signs and symptoms of CHF, and how keep track of weight and sodium intake.   Heart Disease and Intimacy -Discus the effect sexual activity has on the heart, how changes occur during intimacy as we  age, and safety during sexual activity.   Smoking Cessation / COPD -Discuss different methods to quit smoking, the health benefits of quitting smoking, and the definition of COPD.   Nutrition I: Fats -Discuss the types of cholesterol, what cholesterol does to the heart, and how cholesterol levels can be controlled.   Nutrition II: Labels -Discuss the different components of food labels and how to read food label   Heart Parts/Heart Disease and PAD -Discuss the anatomy of the heart, the pathway of blood circulation through the heart, and these are affected by heart disease.   Stress I: Signs and Symptoms -Discuss the causes of stress, how stress may lead to anxiety and depression, and ways to limit stress.   Stress II: Relaxation -Discuss different types of relaxation techniques to limit stress.   Warning Signs of Stroke / TIA -Discuss definition of a stroke, what the signs and symptoms are of a stroke, and how to identify when someone is having stroke.   Knowledge Questionnaire Score: Knowledge Questionnaire Score - 03/22/19 1535      Knowledge Questionnaire Score   Pre Score  20/24       Core Components/Risk Factors/Patient Goals at Admission: Personal Goals and Risk Factors at Admission - 03/22/19 1535      Core Components/Risk Factors/Patient Goals on Admission    Weight Management  Yes;Obesity;Weight Maintenance;Weight Loss    Admit Weight  139 lb 1.8 oz (63.1 kg)    Lipids  Yes    Intervention  Provide education and support for participant on nutrition & aerobic/resistive exercise along with prescribed medications to achieve LDL 70mg , HDL >40mg .    Expected Outcomes  Short Term: Participant states understanding of desired cholesterol values and is compliant with medications prescribed. Participant is following exercise prescription and nutrition guidelines.;Long Term: Cholesterol controlled with medications as prescribed, with individualized exercise RX and with  personalized nutrition plan. Value goals: LDL < 70mg , HDL > 40 mg.    Stress  Yes    Intervention  Offer individual and/or small group education and counseling on adjustment to heart disease, stress management and health-related lifestyle change. Teach and support self-help strategies.;Refer participants experiencing significant psychosocial distress to appropriate mental health specialists for further evaluation and treatment. When possible, include family members and significant others in education/counseling sessions.       Core Components/Risk Factors/Patient Goals Review:    Core Components/Risk Factors/Patient Goals at Discharge (Final Review):    ITP Comments: ITP Comments    Row Name 03/22/19  1410           ITP Comments  Dr. Armanda Magic, Medical Director          Comments: Patient attended orientation on 03/22/2019 to review rules and guidelines for program.  Completed 6 minute walk test, Intitial ITP, and exercise prescription.  VSS. Telemetry-Sinus Rhythm.  Asymptomatic. Safety measures and social distancing in place per CDC guidelines.Gladstone Lighter, RN,BSN 03/22/2019 4:41 PM

## 2019-03-22 NOTE — Progress Notes (Signed)
Dylanie wore sandals for walk test will return this afternoon for orientation and will wear closed toe shoes.Barnet Pall, RN,BSN 03/22/2019 11:15 AM

## 2019-03-23 LAB — COMPREHENSIVE METABOLIC PANEL
ALT: 17 IU/L (ref 0–32)
AST: 20 IU/L (ref 0–40)
Albumin/Globulin Ratio: 2.8 — ABNORMAL HIGH (ref 1.2–2.2)
Albumin: 4.8 g/dL (ref 3.8–4.8)
Alkaline Phosphatase: 61 IU/L (ref 39–117)
BUN/Creatinine Ratio: 25 (ref 12–28)
BUN: 22 mg/dL (ref 8–27)
Bilirubin Total: 0.3 mg/dL (ref 0.0–1.2)
CO2: 23 mmol/L (ref 20–29)
Calcium: 9.9 mg/dL (ref 8.7–10.3)
Chloride: 101 mmol/L (ref 96–106)
Creatinine, Ser: 0.89 mg/dL (ref 0.57–1.00)
GFR calc Af Amer: 78 mL/min/{1.73_m2} (ref >59)
GFR calc non Af Amer: 67 mL/min/{1.73_m2} (ref >59)
Globulin, Total: 1.7 g/dL (ref 1.5–4.5)
Glucose: 89 mg/dL (ref 65–99)
Potassium: 4.5 mmol/L (ref 3.5–5.2)
Sodium: 140 mmol/L (ref 134–144)
Total Protein: 6.5 g/dL (ref 6.0–8.5)

## 2019-03-23 LAB — LIPID PANEL
Chol/HDL Ratio: 3 ratio (ref 0.0–4.4)
Cholesterol, Total: 201 mg/dL — ABNORMAL HIGH (ref 100–199)
HDL: 68 mg/dL (ref >39)
LDL Calculated: 90 mg/dL (ref 0–99)
Triglycerides: 213 mg/dL — ABNORMAL HIGH (ref 0–149)
VLDL Cholesterol Cal: 43 mg/dL — ABNORMAL HIGH (ref 5–40)

## 2019-03-23 NOTE — Progress Notes (Signed)
CMP stable. LDL not at goal of less than 70. Will discuss at [place] and make further recommendations.

## 2019-03-28 ENCOUNTER — Encounter (HOSPITAL_COMMUNITY): Payer: Medicare Other

## 2019-03-29 ENCOUNTER — Encounter: Payer: Self-pay | Admitting: Cardiology

## 2019-03-29 ENCOUNTER — Ambulatory Visit (INDEPENDENT_AMBULATORY_CARE_PROVIDER_SITE_OTHER): Payer: Self-pay | Admitting: Cardiology

## 2019-03-29 ENCOUNTER — Other Ambulatory Visit: Payer: Self-pay

## 2019-03-29 VITALS — BP 123/71 | HR 63 | Ht 61.0 in | Wt 138.8 lb

## 2019-03-29 DIAGNOSIS — I2510 Atherosclerotic heart disease of native coronary artery without angina pectoris: Secondary | ICD-10-CM

## 2019-03-29 DIAGNOSIS — I6523 Occlusion and stenosis of bilateral carotid arteries: Secondary | ICD-10-CM | POA: Diagnosis not present

## 2019-03-29 DIAGNOSIS — E782 Mixed hyperlipidemia: Secondary | ICD-10-CM

## 2019-03-29 MED ORDER — ROSUVASTATIN CALCIUM 20 MG PO TABS: 20.0000 mg | ORAL_TABLET | Freq: Every day | ORAL | 3 refills | Status: DC

## 2019-03-29 NOTE — Progress Notes (Signed)
Primary Physician:  Merri BrunetteSmith, Candace, MD   Patient ID: Diane Richardson, female    DOB: 08/27/1951, 68 y.o.   MRN: [id number]  Subjective:    Chief Complaint  Patient presents with  . Chest Pain   This visit type was conducted due to national recommendations for restrictions regarding the COVID-19 Pandemic (e.g. social distancing).  This format is felt to be most appropriate for this patient at this time.  All issues noted in this document were discussed and addressed.  No physical exam was performed (except for noted visual exam findings with Telehealth visits).  The patient has consented to conduct a Telehealth visit and understands insurance will be billed.   I discussed the limitations of evaluation and management by telemedicine and the availability of in person appointments. The patient expressed understanding and agreed to proceed.  Virtual Visit via Video Note is as below  I connected with Mrs. Becker, on 03/29/19 at 1310 by a video enabled telemedicine application and verified that I am speaking with the correct person using two identifiers.     I have discussed with her regarding the safety during COVID Pandemic and steps and precautions including social distancing with the patient.    HPI: Diane LuxChristie M Greenstreet  is a 68 y.o. female  with history of fibromyalgia, depression, bipolar disorder, hyperlipidemia, and CAD s/p cath 11/23/2018 with stenting to OM1 and bilateral carotid stenosis.  She underwent exercise nuclear stress test on 10/25/2018 with EKG changes, but normal perfusion imaging.  3 times daily was mildly increased at 1.1 and study was considered intermediate risk. Echocardiogram on 10/29/2018 revealed normal LVEF.  She was noted to have greater than 50% stenosis in the right external carotid artery, 50 to 69% stenosis in left internal carotid, mild mixed plaque in the left common carotid by carotid duplex on 10/29/2018.  Due to continued symptoms angina that was activity  limiting, underwent coronary angiogram on 11/23/2018 revealing focal high-grade 99% stenosis in the very large OM1, otherwise tortuous LAD and circumflex coronary artery, no significant disease. S/P stenting of OM1 with 2.5 x 15 mm Orsiro DES. She is now on plavix and ASA.   Patient reports that she is doing well, has not had any episodes of chest pain. She has noticed still some dyspnea with walking uphill, but has also noticed recently bilateral leg fatigue and heaviness with walking uphill. She has not been as active over the last few months due to COVID. She is starting cardiac rehab monday. Hypertension has been well controlled. She recently had lipids performed.  Past Medical History:  Diagnosis Date  . ADD (attention deficit disorder)   . Anxiety   . Coronary artery disease   . Depression   . DVT (deep venous thrombosis) (HCC)   . Fatigue    chronic  . Fibromyalgia   . Hyperlipidemia   . Muscle spasm    bad  . PE (pulmonary embolism)   . PTSD (post-traumatic stress disorder)     Past Surgical History:  Procedure Laterality Date  . BUNIONECTOMY    . CARDIAC CATHETERIZATION    . childbirth  1989   x1,NVD  . CORONARY STENT INTERVENTION N/A 11/23/2018   Procedure: CORONARY STENT INTERVENTION;  Surgeon: Yates DecampGanji, Jay, MD;  Location: [facility];  Service: Cardiovascular;  Laterality: N/A;  OM  . FOOT SURGERY Right    pt states she had bunion surgery right foot.  Marland Kitchen. HAND SURGERY    . LEFT HEART CATH  AND CORONARY ANGIOGRAPHY N/A 11/23/2018   Procedure: LEFT HEART CATH AND CORONARY ANGIOGRAPHY;  Surgeon: Yates DecampGanji, Jay, MD;  Location: [facility];  Service: Cardiovascular;  Laterality: N/A;  . PULMONARY EMBOLISM SURGERY  1983   x9 days    Social History   Socioeconomic History  . Marital status: Married    Spouse name: Not on file  . Number of children: 1  . Years of education: PHD  . Highest education level: Not on file  Occupational History  . Occupation: disabled     Comment: FORMER TEACHER  Social Needs  . Financial resource strain: Not hard at all  . Food insecurity    Worry: Never true    Inability: Never true  . Transportation needs    Medical: No    Non-medical: No  Tobacco Use  . Smoking status: Never Smoker  . Smokeless tobacco: Never Used  Substance and Sexual Activity  . Alcohol use: Yes    Comment: 3 glasses of wine weekly  . Drug use: Never  . Sexual activity: Yes  Lifestyle  . Physical activity    Days per week: 4 days    Minutes per session: 30 min  . Stress: To some extent  Relationships  . Social Musicianconnections    Talks on phone: Not on file    Gets together: Not on file    Attends religious service: Not on file    Active member of club or organization: Not on file    Attends meetings of clubs or organizations: Not on file    Relationship status: Not on file  . Intimate partner violence    Fear of current or ex partner: Not on file    Emotionally abused: Not on file    Physically abused: Not on file    Forced sexual activity: Not on file  Other Topics Concern  . Not on file  Social History Narrative   Drinks less than one cup of caffeine daily.    Review of Systems  Constitution: Negative for decreased appetite, malaise/fatigue, weight gain and weight loss.  Eyes: Negative for visual disturbance.  Cardiovascular: Positive for claudication and dyspnea on exertion. Negative for chest pain, leg swelling, orthopnea, palpitations and syncope.  Respiratory: Negative for hemoptysis and wheezing.   Endocrine: Negative for cold intolerance and heat intolerance.  Hematologic/Lymphatic: Does not bruise/bleed easily.  Skin: Negative for nail changes.  Musculoskeletal: Negative for muscle weakness and myalgias.  Gastrointestinal: Negative for abdominal pain, change in bowel habit, nausea and vomiting.  Neurological: Negative for difficulty with concentration, dizziness, focal weakness and headaches.  Psychiatric/Behavioral:  Negative for altered mental status and suicidal ideas.  All other systems reviewed and are negative.     Objective:  Blood pressure 123/71, pulse 63, height 5\' 1"  (1.549 m), weight 138 lb 12.8 oz (63 kg). Body mass index is 26.23 kg/m.    Physical exam not performed or limited due to virtual visit.  Patient appeared to be in no distress, Neck was supple, respiration was not labored.  Please see exam details from prior visit is as below.   Physical Exam  Constitutional: She is oriented to person, place, and time. Vital signs are normal. She appears well-developed and well-nourished.  HENT:  Head: Normocephalic and atraumatic.  Neck: Normal range of motion.  Cardiovascular: Normal rate, regular rhythm, normal heart sounds and intact distal pulses.  Pulmonary/Chest: Effort normal and breath sounds normal. No accessory muscle usage. No respiratory distress.  Abdominal: Soft.  Bowel sounds are normal.  Musculoskeletal: Normal range of motion.  Neurological: She is alert and oriented to person, place, and time.  Skin: Skin is warm and dry.  Vitals reviewed.  Radiology: No results found.  Laboratory examination:    CMP Latest Ref Rng & Units 03/22/2019 11/15/2018 03/01/2018  Glucose 65 - 99 mg/dL 89 100(H) 92  BUN 8 - 27 mg/dL 22 25 26(H)  Creatinine 0.57 - 1.00 mg/dL 0.89 0.73 0.85  Sodium 134 - 144 mmol/L 140 139 139  Potassium 3.5 - 5.2 mmol/L 4.5 4.6 4.6  Chloride 96 - 106 mmol/L 101 104 104  CO2 20 - 29 mmol/L 23 20 28   Calcium 8.7 - 10.3 mg/dL 9.9 9.4 10.0  Total Protein 6.0 - 8.5 g/dL 6.5 - 6.8  Total Bilirubin 0.0 - 1.2 mg/dL 0.3 - 0.5  Alkaline Phos 39 - 117 IU/L 61 - -  AST 0 - 40 IU/L 20 - 14  ALT 0 - 32 IU/L 17 - 13   CBC Latest Ref Rng & Units 11/15/2018 03/01/2018 05/28/2017  WBC 3.4 - 10.8 x10E3/uL 5.2 4.2 11.1(H)  Hemoglobin 11.1 - 15.9 g/dL 12.7 13.0 13.0  Hematocrit 34.0 - 46.6 % 36.5 38.4 40.5  Platelets 150 - 450 x10E3/uL 247 244 244   Lipid Panel      Component Value Date/Time   CHOL 201 (H) 03/22/2019 1001   TRIG 213 (H) 03/22/2019 1001   HDL 68 03/22/2019 1001   CHOLHDL 3.0 03/22/2019 1001   LDLCALC 90 03/22/2019 1001   HEMOGLOBIN A1C No results found for: HGBA1C, MPG TSH No results for input(s): TSH in the last 8760 hours.  PRN Meds:. There are no discontinued medications. Current Meds  Medication Sig  . aspirin EC 81 MG tablet Take 1 tablet (81 mg total) by mouth daily.  Marland Kitchen bismuth subsalicylate (PEPTO BISMOL) 262 MG/15ML suspension Take 30 mLs by mouth every 6 (six) hours as needed for indigestion.  . celecoxib (CELEBREX) 200 MG capsule TAKE 1 CAPSULE(200 MG) BY MOUTH DAILY AS NEEDED  . Cholecalciferol (VITAMIN D) 50 MCG (2000 UT) CAPS Take 400 Units by mouth daily.   . clopidogrel (PLAVIX) 75 MG tablet Take 1 tablet (75 mg total) by mouth daily.  . Coenzyme Q10 (COQ-10) 200 MG CAPS Take 200 mg by mouth daily.  . diclofenac sodium (VOLTAREN) 1 % GEL Apply 1 application topically 4 (four) times daily as needed (pain).  . DULoxetine (CYMBALTA) 60 MG capsule Take 120 mg by mouth daily.  Marland Kitchen ketoconazole (NIZORAL) 2 % cream Apply 1 application topically daily as needed for irritation.  Marland Kitchen LORazepam (ATIVAN) 0.5 MG tablet Take 1 tablet (0.5 mg total) by mouth 2 (two) times daily. (Patient taking differently: Take 0.5 mg by mouth See admin instructions. Take 0.5 mg in the morning, may take a second 0.5 mg dose as needed for anxiety)  . Menthol-Zinc Oxide (GOLD BOND EX) Apply 1 application topically daily as needed (pain/itching).  . methocarbamol (ROBAXIN) 500 MG tablet Take 500 mg by mouth at bedtime as needed for muscle spasms.  . metoprolol succinate (TOPROL-XL) 25 MG 24 hr tablet Take 1 tablet (25 mg total) by mouth at bedtime.  . nitroGLYCERIN (NITROSTAT) 0.4 MG SL tablet Place 0.4 mg under the tongue every 5 (five) minutes as needed for chest pain.  Marland Kitchen omeprazole (PRILOSEC) 40 MG capsule Take 40 mg by mouth daily.  . QUEtiapine  (SEROQUEL XR) 50 MG TB24 24 hr tablet Take 50 mg by mouth.  Marland Kitchen  simvastatin (ZOCOR) 40 MG tablet Take 1 tablet (40 mg total) by mouth at bedtime.    Cardiac Studies:   Coronary angiogram 11/23/2018: Left dominant circulation, focal high-grade 99% stenosis in the very large OM1, otherwise tortuous LAD and circumflex coronary artery, no significant disease. S/P stenting of OM1 with 2.5 x 15 mm Orsiro DES at 11 atmospheric pressure giving a 2.75 mm lumen. Stenosis reduced from 99% to 0% with maintenance of TIMI-3 to TIMI-3 flow.  Echocardiogram 10/29/2018: Left ventricle cavity is normal in size. Mild concentric hypertrophy of the left ventricle. Normal global wall motion. Normal diastolic filling pattern. Calculated EF 58%. Mild to moderate mitral regurgitation. Mild tricuspid regurgitation. No evidence of pulmonary hypertension.  Carotid artery duplex 10/29/2018: Minimal mixed plaque in the right ICA. Stenosis in the right external carotid artery (>50%). Stenosis in the left internal carotid artery (50-69%). Mild mixed plaque in the left common carotid artery (<50%). Antegrade right vertebral artery flow. Antegrade left vertebral artery flow. Follow up in six months is appropriate if clinically indicated.  Exercise sestamibi stress test 10/25/2018: 1. The patient performed treadmill exercise using Bruce protocol, completing 5:03 minutes. The patient completed an estimated workload of 7 METS, reaching 86% of the maximum predicted heart rate. Exercise capacity was low. Hemodynamic response was normal. Stress symptoms included exercise induced chest pain, relieved with rest.  2. The overall quality of the study is good. There is no evidence of abnormal lung activity. Stress and rest SPECT images demonstrate homogeneous tracer distribution throughout the myocardium. TID was mildly increased at 1.1. Gated SPECT imaging reveals normal myocardial thickening and wall motion. The left ventricular  ejection fraction was normal (66%).  3. Intermediate risk study given symptoms and EKG changes. No SPECT evidence of ischemia or infarction see. Clinical correlation recommended.  Assessment:     ICD-10-CM   1. Atherosclerosis of native coronary artery of native heart without angina pectoris  I25.10   2. Mixed hyperlipidemia  E78.2 Lipid Profile  3. Bilateral carotid artery stenosis  I65.23 PCV CAROTID DUPLEX (BILATERAL)     EKG 10/06/2018: Normal sinus rhythm at rate of 71 bpm, normal axis, inferior and lateral ST depression, cannot exclude ischemia. Abnormal EKG. No significant change from 10/04/17.  Recommendations:   Patient is overall doing well without any symptoms of angina.  No symptoms suggestive of decompensated heart failure.  I have discussed recently obtained labs, lipids are not well controlled with LDL of 90.  I will change simvastatin to Crestor 20 mg daily.  Will recheck lipids in 8 weeks for follow-up.  She is to start cardiac rehab next week.  She has not been very active for the last several weeks.   She does mention going for a walk and noticed with walking uphill she had bilateral leg heaviness and fatigue suggestive of claudication.  I discussed many etiologies for this.  Suspect may be related to inactivity, I have encouraged her to continue to monitor and if she does not have any improvement in this will consider further evaluation.  Blood pressure remains well controlled.  She continues to be on DAPT.  I will see her back in the office in 3 months for follow-up.  She does have bilateral carotid stenosis, and will need repeat carotid duplex for surveillance prior to her next office visit.  Toniann FailAshton Haynes , MSN, APRN, FNP-C [facility] Office: [phone] Fax: [phone]

## 2019-03-30 ENCOUNTER — Other Ambulatory Visit: Payer: Self-pay

## 2019-03-30 ENCOUNTER — Encounter (HOSPITAL_COMMUNITY)
Admission: RE | Admit: 2019-03-30 | Discharge: 2019-03-30 | Disposition: A | Discharge status: Home / Self Care | Payer: Medicare Other | Source: Ambulatory Visit | Attending: Cardiology | Admitting: Cardiology

## 2019-03-30 ENCOUNTER — Encounter: Payer: Self-pay | Admitting: Cardiology

## 2019-03-30 DIAGNOSIS — Z955 Presence of coronary angioplasty implant and graft: Secondary | ICD-10-CM | POA: Diagnosis present

## 2019-03-30 NOTE — Progress Notes (Signed)
Daily Session Note  Patient Details  Name: Diane Richardson MRN: [id number] Date of Birth: Jan 21, 1951 Referring Provider:     [place] from 03/22/2019 in [place]  Referring Provider  Dr. Einar Gip      Encounter Date: 03/30/2019  Check In: Session Check In - 03/30/19 1106      Check-In   Supervising physician immediately available to respond to emergencies  [facility] immediately available    Physician(s)  Dr. [place] Crane    Location  [facility]    Staff Present  Jiles Garter, RN, BSN;Dalton Kris Mouton, MS, Exercise Physiologist;Olinty Celesta Aver, MS, ACSM CEP, Exercise Physiologist;Brittany Durene Fruits, BS, ACSM CEP, Exercise Physiologist    Virtual Visit  No    Medication changes reported      No    Fall or balance concerns reported     No    Tobacco Cessation  No Change    Warm-up and Cool-down  Performed on first and last piece of equipment    Resistance Training Performed  No    VAD Patient?  No    PAD/SET Patient?  No      Pain Assessment   Currently in Pain?  No/denies    Multiple Pain Sites  No       Capillary Blood Glucose: No results found for this or any previous visit (from the past 24 hour(s)).  Exercise Prescription Changes - 03/30/19 1200      Response to Exercise   Blood Pressure (Admit)  112/56    Blood Pressure (Exercise)  124/70    Blood Pressure (Exit)  102/64    Heart Rate (Admit)  68 bpm    Heart Rate (Exercise)  79 bpm    Heart Rate (Exit)  68 bpm    Rating of Perceived Exertion (Exercise)  12    Symptoms  None    Comments  Pt first day of exercise.     Duration  Continue with 30 min of aerobic exercise without signs/symptoms of physical distress.    Intensity  THRR unchanged      Progression   Progression  Continue to progress workloads to maintain intensity without signs/symptoms of physical distress.    Average METs  2.4      Resistance Training   Training Prescription  No       NuStep   Level  2    SPM  75    Minutes  15    METs  1.8      Arm Ergometer   Level  1    Watts  25    Minutes  15    METs  3.09       Social History   Tobacco Use  Smoking Status Never Smoker  Smokeless Tobacco Never Used    Goals Met:  Exercise tolerated well  Goals Unmet:  Not Applicable  Comments: Pt started cardiac rehab today.  Pt tolerated light exercise without difficulty. VSS, telemetry-SR, asymptomatic.  Medication list reconciled. Pt denies barriers to medicaiton compliance.  PSYCHOSOCIAL ASSESSMENT:  PHQ-0. Pt exhibits positive coping skills, hopeful outlook with supportive family. No psychosocial needs identified at this time, no psychosocial interventions necessary.  Pt oriented to exercise equipment and routine.    Understanding verbalized.    Dr. Fransico Him is Medical Director for Cardiac Rehab at [facility].

## 2019-03-31 NOTE — Progress Notes (Signed)
Cardiac Individual Treatment Plan  Patient Details  Name: Diane Richardson MRN: [id number] Date of Birth: 09/25/1950 Referring Provider:     CARDIAC REHAB PHASE II [facility]ATION [facility]  Referring Provider  Dr. Jacinto HalimGanji      Initial Encounter Date:    CARDIAC REHAB PHASE [facility]  Date  03/22/19      Visit Diagnosis: Status post coronary artery stent placement 11/23/18 S/P DES OM  Patient's Home Medications on Admission:  Current Outpatient Medications:  .  aspirin EC 81 MG tablet, Take 1 tablet (81 mg total) by mouth daily., Disp: , Rfl:  .  bismuth subsalicylate (PEPTO BISMOL) 262 MG/15ML suspension, Take 30 mLs by mouth every 6 (six) hours as needed for indigestion., Disp: , Rfl:  .  celecoxib (CELEBREX) 200 MG capsule, TAKE 1 CAPSULE(200 MG) BY MOUTH DAILY AS NEEDED, Disp: 30 capsule, Rfl: 0 .  Cholecalciferol (VITAMIN D) 50 MCG (2000 UT) CAPS, Take 400 Units by mouth daily. , Disp: , Rfl:  .  clopidogrel (PLAVIX) 75 MG tablet, Take 1 tablet (75 mg total) by mouth daily., Disp: 90 tablet, Rfl: 1 .  Coenzyme Q10 (COQ-10) 200 MG CAPS, Take 200 mg by mouth daily., Disp: , Rfl:  .  diclofenac sodium (VOLTAREN) 1 % GEL, Apply 1 application topically 4 (four) times daily as needed (pain)., Disp: , Rfl:  .  DULoxetine (CYMBALTA) 60 MG capsule, Take 120 mg by mouth daily., Disp: , Rfl:  .  ketoconazole (NIZORAL) 2 % cream, Apply 1 application topically daily as needed for irritation., Disp: , Rfl:  .  LORazepam (ATIVAN) 0.5 MG tablet, Take 1 tablet (0.5 mg total) by mouth 2 (two) times daily. (Patient taking differently: Take 0.5 mg by mouth See admin instructions. Take 0.5 mg in the morning, may take a second 0.5 mg dose as needed for anxiety), Disp: 30 tablet, Rfl: 0 .  Menthol-Zinc Oxide (GOLD BOND EX), Apply 1 application topically daily as needed (pain/itching)., Disp: ,  Rfl:  .  methocarbamol (ROBAXIN) 500 MG tablet, Take 500 mg by mouth at bedtime as needed for muscle spasms., Disp: , Rfl:  .  metoprolol succinate (TOPROL-XL) 25 MG 24 hr tablet, Take 1 tablet (25 mg total) by mouth at bedtime., Disp: 90 tablet, Rfl: 1 .  nitroGLYCERIN (NITROSTAT) 0.4 MG SL tablet, Place 0.4 mg under the tongue every 5 (five) minutes as needed for chest pain., Disp: , Rfl:  .  omeprazole (PRILOSEC) 40 MG capsule, Take 40 mg by mouth daily., Disp: , Rfl:  .  QUEtiapine (SEROQUEL XR) 50 MG TB24 24 hr tablet, Take 50 mg by mouth., Disp: , Rfl:  .  rosuvastatin (CRESTOR) 20 MG tablet, Take 1 tablet (20 mg total) by mouth daily., Disp: 30 tablet, Rfl: 3  Past Medical History: Past Medical History:  Diagnosis Date  . ADD (attention deficit disorder)   . Anxiety   . Coronary artery disease   . Depression   . DVT (deep venous thrombosis) (HCC)   . Fatigue    chronic  . Fibromyalgia   . Hyperlipidemia   . Muscle spasm    bad  . PE (pulmonary embolism)   . PTSD (post-traumatic stress disorder)     Tobacco Use: Social History   Tobacco Use  Smoking Status Never Smoker  Smokeless Tobacco Never Used    Labs: Recent Review Advice workerlowsheet Data  Labs for ITP Cardiac and Pulmonary Rehab Latest Ref Rng & Units 03/22/2019   Cholestrol 100 - 199 mg/dL 409(W201(H)   LDLCALC 0 - 99 mg/dL 90   HDL >11>39 mg/dL 68   Trlycerides 0 - 914149 mg/dL 782(N213(H)      Capillary Blood Glucose: No results found for: GLUCAP   Exercise Target Goals: Exercise Program Goal: Individual exercise prescription set using results from initial 6 min walk test and THRR while considering  patient's activity barriers and safety.   Exercise Prescription Goal: Initial exercise prescription builds to 30-45 minutes a day of aerobic activity, 2-3 days per week.  Home exercise guidelines will be given to patient during program as part of exercise prescription that the participant will acknowledge.  Activity Barriers  & Risk Stratification: Activity Barriers & Cardiac Risk Stratification - 03/22/19 1526      Activity Barriers & Cardiac Risk Stratification   Activity Barriers  History of Falls;Balance Concerns;Joint Problems;Deconditioning;Other (comment)    Comments  Right hip and leg pain    Cardiac Risk Stratification  High       6 Minute Walk: 6 Minute Walk    Row Name 03/22/19 1526         6 Minute Walk   Phase  Initial     Distance  1487 feet     Walk Time  6 minutes     # of Rest Breaks  0     MPH  2.8     METS  3.2     RPE  11     Perceived Dyspnea   0     VO2 Peak  11.46     Symptoms  No     Resting HR  72 bpm     Resting BP  102/70     Resting Oxygen Saturation   95 %     Exercise Oxygen Saturation  during 6 min walk  100 %     Max Ex. HR  98 bpm     Max Ex. BP  133/53     2 Minute Post BP  102/62        Oxygen Initial Assessment:   Oxygen Re-Evaluation:   Oxygen Discharge (Final Oxygen Re-Evaluation):   Initial Exercise Prescription: Initial Exercise Prescription - 03/22/19 1500      Date of Initial Exercise RX and Referring Provider   Date  03/22/19    Referring Provider  Dr. Jacinto HalimGanji    Expected Discharge Date  05/06/19      NuStep   Level  2    SPM  75    Minutes  15    METs  3      Arm Ergometer   Level  1    Watts  35    Minutes  15    METs  2.8      Prescription Details   Frequency (times per week)  3    Duration  Progress to 30 minutes of continuous aerobic without signs/symptoms of physical distress      Intensity   THRR 40-80% of Max Heartrate  61-122    Ratings of Perceived Exertion  11-13      Progression   Progression  Continue to progress workloads to maintain intensity without signs/symptoms of physical distress.      Resistance Training   Training Prescription  Yes    Weight  2 lbs.     Reps  10-15       Perform  Capillary Blood Glucose checks as needed.  Exercise Prescription Changes: Exercise Prescription Changes    Row  Name 03/30/19 1200             Response to Exercise   Blood Pressure (Admit)  112/56       Blood Pressure (Exercise)  124/70       Blood Pressure (Exit)  102/64       Heart Rate (Admit)  68 bpm       Heart Rate (Exercise)  79 bpm       Heart Rate (Exit)  68 bpm       Rating of Perceived Exertion (Exercise)  12       Symptoms  None       Comments  Pt first day of exercise.        Duration  Continue with 30 min of aerobic exercise without signs/symptoms of physical distress.       Intensity  THRR unchanged         Progression   Progression  Continue to progress workloads to maintain intensity without signs/symptoms of physical distress.       Average METs  2.4         Resistance Training   Training Prescription  No         NuStep   Level  2       SPM  75       Minutes  15       METs  1.8         Arm Ergometer   Level  1       Watts  25       Minutes  15       METs  3.09          Exercise Comments: Exercise Comments    Row Name 03/30/19 1205           Exercise Comments  Pt first day of exercise. Pt tolerated exercise well.          Exercise Goals and Review: Exercise Goals    Row Name 03/22/19 1530             Exercise Goals   Increase Physical Activity  Yes       Intervention  Provide advice, education, support and counseling about physical activity/exercise needs.;Develop an individualized exercise prescription for aerobic and resistive training based on initial evaluation findings, risk stratification, comorbidities and participant's personal goals.       Expected Outcomes  Short Term: Attend rehab on a regular basis to increase amount of physical activity.;Long Term: Add in home exercise to make exercise part of routine and to increase amount of physical activity.;Long Term: Exercising regularly at least 3-5 days a week.       Increase Strength and Stamina  Yes       Intervention  Provide advice, education, support and counseling about physical  activity/exercise needs.;Develop an individualized exercise prescription for aerobic and resistive training based on initial evaluation findings, risk stratification, comorbidities and participant's personal goals.       Expected Outcomes  Short Term: Increase workloads from initial exercise prescription for resistance, speed, and METs.;Short Term: Perform resistance training exercises routinely during rehab and add in resistance training at home;Long Term: Improve cardiorespiratory fitness, muscular endurance and strength as measured by increased METs and functional capacity ( )       Able to understand and use rate of perceived exertion (RPE) scale  Yes  Intervention  Provide education and explanation on how to use RPE scale       Expected Outcomes  Short Term: Able to use RPE daily in rehab to express subjective intensity level;Long Term:  Able to use RPE to guide intensity level when exercising independently       Knowledge and understanding of Target Heart Rate Range (THRR)  Yes       Intervention  Provide education and explanation of THRR including how the numbers were predicted and where they are located for reference       Expected Outcomes  Short Term: Able to state/look up THRR;Long Term: Able to use THRR to govern intensity when exercising independently;Short Term: Able to use daily as guideline for intensity in rehab       Able to check pulse independently  Yes       Intervention  Provide education and demonstration on how to check pulse in carotid and radial arteries.;Review the importance of being able to check your own pulse for safety during independent exercise       Expected Outcomes  Short Term: Able to explain why pulse checking is important during independent exercise;Long Term: Able to check pulse independently and accurately       Understanding of Exercise Prescription  Yes       Intervention  Provide education, explanation, and written materials on patient's individual  exercise prescription       Expected Outcomes  Short Term: Able to explain program exercise prescription;Long Term: Able to explain home exercise prescription to exercise independently          Exercise Goals Re-Evaluation : Exercise Goals Re-Evaluation    Row Name 03/30/19 1204             Exercise Goal Re-Evaluation   Exercise Goals Review  Increase Physical Activity;Increase Strength and Stamina;Able to understand and use rate of perceived exertion (RPE) scale;Knowledge and understanding of Target Heart Rate Range (THRR);Understanding of Exercise Prescription       Comments  Pt first day of exercise in CR program. pt tolerated exercise well. Pt understands THRR, exercise prescription, and RPE scale.       Expected Outcomes  Will continue to monitor and progress Pt as tolerated.          Discharge Exercise Prescription (Final Exercise Prescription Changes): Exercise Prescription Changes - 03/30/19 1200      Response to Exercise   Blood Pressure (Admit)  112/56    Blood Pressure (Exercise)  124/70    Blood Pressure (Exit)  102/64    Heart Rate (Admit)  68 bpm    Heart Rate (Exercise)  79 bpm    Heart Rate (Exit)  68 bpm    Rating of Perceived Exertion (Exercise)  12    Symptoms  None    Comments  Pt first day of exercise.     Duration  Continue with 30 min of aerobic exercise without signs/symptoms of physical distress.    Intensity  THRR unchanged      Progression   Progression  Continue to progress workloads to maintain intensity without signs/symptoms of physical distress.    Average METs  2.4      Resistance Training   Training Prescription  No      NuStep   Level  2    SPM  75    Minutes  15    METs  1.8      Arm Ergometer   Level  1  Watts  25    Minutes  15    METs  3.09       Nutrition:  Target Goals: Understanding of nutrition guidelines, daily intake of sodium 1500mg , cholesterol 200mg , calories 30% from fat and 7% or less from saturated fats,  daily to have 5 or more servings of fruits and vegetables.  Biometrics: Pre Biometrics - 03/22/19 1532      Pre Biometrics   Height  5' 0.25" (1.53 m)    Weight  63.1 kg    Waist Circumference  32.5 inches    Hip Circumference  41 inches    Waist to Hip Ratio  0.79 %    BMI (Calculated)  26.96    Triceps Skinfold  41 mm    % Body Fat  40.3 %    Grip Strength  24 kg    Flexibility  16.5 in    Single Leg Stand  10.25 seconds        Nutrition Therapy Plan and Nutrition Goals:   Nutrition Assessments:   Nutrition Goals Re-Evaluation:   Nutrition Goals Re-Evaluation:   Nutrition Goals Discharge (Final Nutrition Goals Re-Evaluation):   Psychosocial: Target Goals: Acknowledge presence or absence of significant depression and/or stress, maximize coping skills, provide positive support system. Participant is able to verbalize types and ability to use techniques and skills needed for reducing stress and depression.  Initial Review & Psychosocial Screening: Initial Psych Review & Screening - 03/22/19 1631      Initial Review   Current issues with  History of Depression      Family Dynamics   Good Support System?  Yes   Diane Richardson has her husband for support   Comments  Diane Richardson has a history of depression takes seroquel and cymbalta      Barriers   Psychosocial barriers to participate in program  Psychosocial barriers identified (see note)      Screening Interventions   Interventions  Encouraged to exercise;Provide feedback about the scores to participant       Quality of Life Scores: Quality of Life - 03/22/19 1533      Quality of Life   Select  Quality of Life      Quality of Life Scores   Health/Function Pre  15.04 %    Socioeconomic Pre  19 %    Psych/Spiritual Pre  18.79 %    Family Pre  22.9 %    GLOBAL Pre  17.86 %      Scores of 19 and below usually indicate a poorer quality of life in these areas.  A difference of  2-3 points is a clinically  meaningful difference.  A difference of 2-3 points in the total score of the Quality of Life Index has been associated with significant improvement in overall quality of life, self-image, physical symptoms, and general health in studies assessing change in quality of life.  PHQ-9: Recent Review Flowsheet Data    Depression screen [facility] 2/9 03/22/2019 05/10/2015   Decreased Interest 0 1   Down, Depressed, Hopeless 0 0   PHQ - 2 Score 0 1     Interpretation of Total Score  Total Score Depression Severity:  1-4 = Minimal depression, 5-9 = Mild depression, 10-14 = Moderate depression, 15-19 = Moderately severe depression, 20-27 = Severe depression   Psychosocial Evaluation and Intervention: Psychosocial Evaluation - 03/30/19 1333      Psychosocial Evaluation & Interventions   Interventions  Encouraged to exercise with the program and follow  exercise prescription;Relaxation education;Stress management education    Comments  Diane Richardson has depression but it is manageable. She enjoys riding horses.    Expected Outcomes  Diane Richardson will report the ability to manage her depression.    Continue Psychosocial Services   Follow up required by staff       Psychosocial Re-Evaluation:   Psychosocial Discharge (Final Psychosocial Re-Evaluation):   Vocational Rehabilitation: Provide vocational rehab assistance to qualifying candidates.   Vocational Rehab Evaluation & Intervention: Vocational Rehab - 03/22/19 1636      Initial Vocational Rehab Evaluation & Intervention   Assessment shows need for Vocational Rehabilitation  No       Education: Education Goals: Education classes will be provided on a weekly basis, covering required topics. Participant will state understanding/return demonstration of topics presented.  Learning Barriers/Preferences: Learning Barriers/Preferences - 03/22/19 1534      Learning Barriers/Preferences   Learning Barriers  None    Learning Preferences   Audio;Computer/Internet;Group Instruction;Individual Instruction;Skilled Demonstration;Pictoral;Verbal Instruction;Video;Written Material       Education Topics: Count Your Pulse:  -Group instruction provided by verbal instruction, demonstration, patient participation and written materials to support subject.  Instructors address importance of being able to find your pulse and how to count your pulse when at home without a heart monitor.  Patients get hands on experience counting their pulse with staff help and individually.   Heart Attack, Angina, and Risk Factor Modification:  -Group instruction provided by verbal instruction, video, and written materials to support subject.  Instructors address signs and symptoms of angina and heart attacks.    Also discuss risk factors for heart disease and how to make changes to improve heart health risk factors.   Functional Fitness:  -Group instruction provided by verbal instruction, demonstration, patient participation, and written materials to support subject.  Instructors address safety measures for doing things around the house.  Discuss how to get up and down off the floor, how to pick things up properly, how to safely get out of a chair without assistance, and balance training.   Meditation and Mindfulness:  -Group instruction provided by verbal instruction, patient participation, and written materials to support subject.  Instructor addresses importance of mindfulness and meditation practice to help reduce stress and improve awareness.  Instructor also leads participants through a meditation exercise.    Stretching for Flexibility and Mobility:  -Group instruction provided by verbal instruction, patient participation, and written materials to support subject.  Instructors lead participants through series of stretches that are designed to increase flexibility thus improving mobility.  These stretches are additional exercise for major muscle groups  that are typically performed during regular warm up and cool down.   Hands Only CPR:  -Group verbal, video, and participation provides a basic overview of AHA guidelines for community CPR. Role-play of emergencies allow participants the opportunity to practice calling for help and chest compression technique with discussion of AED use.   Hypertension: -Group verbal and written instruction that provides a basic overview of hypertension including the most recent diagnostic guidelines, risk factor reduction with self-care instructions and medication management.    Nutrition I class: Heart Healthy Eating:  -Group instruction provided by PowerPoint slides, verbal discussion, and written materials to support subject matter. The instructor gives an explanation and review of the Therapeutic Lifestyle Changes diet recommendations, which includes a discussion on lipid goals, dietary fat, sodium, fiber, plant stanol/sterol esters, sugar, and the components of a well-balanced, healthy diet.   Nutrition II class: Lifestyle Skills:  -  Group instruction provided by PowerPoint slides, verbal discussion, and written materials to support subject matter. The instructor gives an explanation and review of label reading, grocery shopping for heart health, heart healthy recipe modifications, and ways to make healthier choices when eating out.   Diabetes Question & Answer:  -Group instruction provided by PowerPoint slides, verbal discussion, and written materials to support subject matter. The instructor gives an explanation and review of diabetes co-morbidities, pre- and post-prandial blood glucose goals, pre-exercise blood glucose goals, signs, symptoms, and treatment of hypoglycemia and hyperglycemia, and foot care basics.   Diabetes Blitz:  -Group instruction provided by PowerPoint slides, verbal discussion, and written materials to support subject matter. The instructor gives an explanation and review of the  physiology behind type 1 and type 2 diabetes, diabetes medications and rational behind using different medications, pre- and post-prandial blood glucose recommendations and Hemoglobin A1c goals, diabetes diet, and exercise including blood glucose guidelines for exercising safely.    Portion Distortion:  -Group instruction provided by PowerPoint slides, verbal discussion, written materials, and food models to support subject matter. The instructor gives an explanation of serving size versus portion size, changes in portions sizes over the last 20 years, and what consists of a serving from each food group.   Stress Management:  -Group instruction provided by verbal instruction, video, and written materials to support subject matter.  Instructors review role of stress in heart disease and how to cope with stress positively.     Exercising on Your Own:  -Group instruction provided by verbal instruction, power point, and written materials to support subject.  Instructors discuss benefits of exercise, components of exercise, frequency and intensity of exercise, and end points for exercise.  Also discuss use of nitroglycerin and activating EMS.  Review options of places to exercise outside of rehab.  Review guidelines for sex with heart disease.   Cardiac Drugs I:  -Group instruction provided by verbal instruction and written materials to support subject.  Instructor reviews cardiac drug classes: antiplatelets, anticoagulants, beta blockers, and statins.  Instructor discusses reasons, side effects, and lifestyle considerations for each drug class.   Cardiac Drugs II:  -Group instruction provided by verbal instruction and written materials to support subject.  Instructor reviews cardiac drug classes: angiotensin converting enzyme inhibitors (ACE-I), angiotensin II receptor blockers (ARBs), nitrates, and calcium channel blockers.  Instructor discusses reasons, side effects, and lifestyle considerations  for each drug class.   Anatomy and Physiology of the Circulatory System:  Group verbal and written instruction and models provide basic cardiac anatomy and physiology, with the coronary electrical and arterial systems. Review of: AMI, Angina, Valve disease, Heart Failure, Peripheral Artery Disease, Cardiac Arrhythmia, Pacemakers, and the ICD.   Other Education:  -Group or individual verbal, written, or video instructions that support the educational goals of the cardiac rehab program.   Holiday Eating Survival Tips:  -Group instruction provided by PowerPoint slides, verbal discussion, and written materials to support subject matter. The instructor gives patients tips, tricks, and techniques to help them not only survive but enjoy the holidays despite the onslaught of food that accompanies the holidays.   Knowledge Questionnaire Score: Knowledge Questionnaire Score - 03/22/19 1535      Knowledge Questionnaire Score   Pre Score  20/24       Core Components/Risk Factors/Patient Goals at Admission: Personal Goals and Risk Factors at Admission - 03/22/19 1535      Core Components/Risk Factors/Patient Goals on Admission    Weight Management  Yes;Obesity;Weight Maintenance;Weight Loss    Admit Weight  139 lb 1.8 oz (63.1 kg)    Lipids  Yes    Intervention  Provide education and support for participant on nutrition & aerobic/resistive exercise along with prescribed medications to achieve LDL 70mg , HDL >40mg .    Expected Outcomes  Short Term: Participant states understanding of desired cholesterol values and is compliant with medications prescribed. Participant is following exercise prescription and nutrition guidelines.;Long Term: Cholesterol controlled with medications as prescribed, with individualized exercise RX and with personalized nutrition plan. Value goals: LDL < 70mg , HDL > 40 mg.    Stress  Yes    Intervention  Offer individual and/or small group education and counseling on  adjustment to heart disease, stress management and health-related lifestyle change. Teach and support self-help strategies.;Refer participants experiencing significant psychosocial distress to appropriate mental health specialists for further evaluation and treatment. When possible, include family members and significant others in education/counseling sessions.       Core Components/Risk Factors/Patient Goals Review:  Goals and Risk Factor Review    Row Name 03/30/19 1359             Core Components/Risk Factors/Patient Goals Review   Personal Goals Review  Weight Management/Obesity;Stress;Lipids       Review  Pt with few CAD RFs willing to participate in CR exercise.  Destyne would like to increase her core strength.       Expected Outcomes  Pt will continue to participate in CR exercise, nutrition, and lifestyle modification opportunities.          Core Components/Risk Factors/Patient Goals at Discharge (Final Review):  Goals and Risk Factor Review - 03/30/19 1359      Core Components/Risk Factors/Patient Goals Review   Personal Goals Review  Weight Management/Obesity;Stress;Lipids    Review  Pt with few CAD RFs willing to participate in CR exercise.  Lakiya would like to increase her core strength.    Expected Outcomes  Pt will continue to participate in CR exercise, nutrition, and lifestyle modification opportunities.       ITP Comments: ITP Comments    Row Name 03/22/19 1410 03/30/19 1332         ITP Comments  Dr. Fransico Him, Medical Director  30 Day ITP Review.  Pt started exercise today and tolerated it well.         Comments: ITP Comments

## 2019-04-01 ENCOUNTER — Encounter (HOSPITAL_COMMUNITY)
Admission: RE | Admit: 2019-04-01 | Discharge: 2019-04-01 | Disposition: A | Discharge status: Home / Self Care | Payer: Medicare Other | Source: Ambulatory Visit | Attending: Cardiology | Admitting: Cardiology

## 2019-04-01 ENCOUNTER — Other Ambulatory Visit: Payer: Self-pay

## 2019-04-01 DIAGNOSIS — Z955 Presence of coronary angioplasty implant and graft: Secondary | ICD-10-CM

## 2019-04-04 ENCOUNTER — Encounter (HOSPITAL_COMMUNITY)
Admission: RE | Admit: 2019-04-04 | Discharge: 2019-04-04 | Disposition: A | Discharge status: Home / Self Care | Payer: Medicare Other | Source: Ambulatory Visit | Attending: Cardiology | Admitting: Cardiology

## 2019-04-04 ENCOUNTER — Other Ambulatory Visit: Payer: Self-pay

## 2019-04-04 DIAGNOSIS — Z955 Presence of coronary angioplasty implant and graft: Secondary | ICD-10-CM

## 2019-04-06 ENCOUNTER — Encounter (HOSPITAL_COMMUNITY)
Admission: RE | Admit: 2019-04-06 | Discharge: 2019-04-06 | Disposition: A | Discharge status: Home / Self Care | Payer: Medicare Other | Source: Ambulatory Visit | Attending: Cardiology | Admitting: Cardiology

## 2019-04-06 ENCOUNTER — Other Ambulatory Visit: Payer: Self-pay

## 2019-04-06 DIAGNOSIS — Z955 Presence of coronary angioplasty implant and graft: Secondary | ICD-10-CM | POA: Diagnosis not present

## 2019-04-08 ENCOUNTER — Encounter (HOSPITAL_COMMUNITY): Payer: Medicare Other

## 2019-04-08 ENCOUNTER — Telehealth (HOSPITAL_COMMUNITY): Payer: Self-pay | Admitting: Family Medicine

## 2019-04-11 ENCOUNTER — Encounter (HOSPITAL_COMMUNITY)
Admission: RE | Admit: 2019-04-11 | Discharge: 2019-04-11 | Disposition: A | Discharge status: Home / Self Care | Payer: Medicare Other | Source: Ambulatory Visit | Attending: Cardiology | Admitting: Cardiology

## 2019-04-11 ENCOUNTER — Other Ambulatory Visit: Payer: Self-pay

## 2019-04-11 DIAGNOSIS — Z955 Presence of coronary angioplasty implant and graft: Secondary | ICD-10-CM | POA: Diagnosis present

## 2019-04-11 NOTE — Progress Notes (Signed)
QUALITY OF LIFE SCORE REVIEW  Pt completed Quality of Life survey as a participant in Cardiac Rehab. Scores 21.0 or below are considered low. Pt score very low in several areas Overall 17.86, Health and Function 15.04, socioeconomic 19.00, physiological and spiritual 18.79, family 22.90. Patient quality of life slightly altered by physical constraints which limits ability to perform as prior to recent cardiac illness.  Diane Richardson reports inability to participate in former hobbies.  She says this is because her family is worried about her getting the coronavirus and its potential effects with her cardiac history.  [name] feels that should would be able to participate in these hobbies despite her heart disease.  Offered emotional support and reassurance.  Will continue to monitor and intervene as necessary.

## 2019-04-13 ENCOUNTER — Other Ambulatory Visit: Payer: Self-pay

## 2019-04-13 ENCOUNTER — Encounter (HOSPITAL_COMMUNITY)
Admission: RE | Admit: 2019-04-13 | Discharge: 2019-04-13 | Disposition: A | Discharge status: Home / Self Care | Payer: Medicare Other | Source: Ambulatory Visit | Attending: Cardiology | Admitting: Cardiology

## 2019-04-13 DIAGNOSIS — Z955 Presence of coronary angioplasty implant and graft: Secondary | ICD-10-CM

## 2019-04-15 ENCOUNTER — Other Ambulatory Visit: Payer: Self-pay

## 2019-04-15 ENCOUNTER — Encounter (HOSPITAL_COMMUNITY)
Admission: RE | Admit: 2019-04-15 | Discharge: 2019-04-15 | Disposition: A | Discharge status: Home / Self Care | Payer: Medicare Other | Source: Ambulatory Visit | Attending: Cardiology | Admitting: Cardiology

## 2019-04-15 DIAGNOSIS — Z955 Presence of coronary angioplasty implant and graft: Secondary | ICD-10-CM | POA: Diagnosis not present

## 2019-04-18 ENCOUNTER — Other Ambulatory Visit: Payer: Self-pay | Admitting: Cardiology

## 2019-04-18 ENCOUNTER — Other Ambulatory Visit: Payer: Self-pay

## 2019-04-18 ENCOUNTER — Encounter (HOSPITAL_COMMUNITY)
Admission: RE | Admit: 2019-04-18 | Discharge: 2019-04-18 | Disposition: A | Discharge status: Home / Self Care | Payer: Medicare Other | Source: Ambulatory Visit | Attending: Cardiology | Admitting: Cardiology

## 2019-04-18 DIAGNOSIS — Z955 Presence of coronary angioplasty implant and graft: Secondary | ICD-10-CM | POA: Diagnosis not present

## 2019-04-20 ENCOUNTER — Other Ambulatory Visit: Payer: Self-pay

## 2019-04-20 ENCOUNTER — Encounter (HOSPITAL_COMMUNITY)
Admission: RE | Admit: 2019-04-20 | Discharge: 2019-04-20 | Disposition: A | Discharge status: Home / Self Care | Payer: Medicare Other | Source: Ambulatory Visit | Attending: Cardiology | Admitting: Cardiology

## 2019-04-20 DIAGNOSIS — Z955 Presence of coronary angioplasty implant and graft: Secondary | ICD-10-CM | POA: Diagnosis not present

## 2019-04-22 ENCOUNTER — Other Ambulatory Visit: Payer: Self-pay

## 2019-04-22 ENCOUNTER — Encounter (HOSPITAL_COMMUNITY)
Admission: RE | Admit: 2019-04-22 | Discharge: 2019-04-22 | Disposition: A | Discharge status: Home / Self Care | Payer: Medicare Other | Source: Ambulatory Visit | Attending: Cardiology | Admitting: Cardiology

## 2019-04-22 DIAGNOSIS — Z955 Presence of coronary angioplasty implant and graft: Secondary | ICD-10-CM

## 2019-04-22 NOTE — Progress Notes (Signed)
Reviewed home exercise guidelines with patient including endpoints, temperature precautions, target heart rate and rate of perceived exertion. Pt is walking and practicing yoga as her mode of home exercise. Pt voices understanding of instructions given.  Sol Passer, MS, ACSM CEP

## 2019-04-25 ENCOUNTER — Ambulatory Visit (INDEPENDENT_AMBULATORY_CARE_PROVIDER_SITE_OTHER): Payer: Self-pay

## 2019-04-25 ENCOUNTER — Other Ambulatory Visit: Payer: Self-pay

## 2019-04-25 ENCOUNTER — Encounter (HOSPITAL_COMMUNITY)
Admission: RE | Admit: 2019-04-25 | Discharge: 2019-04-25 | Disposition: A | Discharge status: Home / Self Care | Payer: Medicare Other | Source: Ambulatory Visit | Attending: Cardiology | Admitting: Cardiology

## 2019-04-25 DIAGNOSIS — I6523 Occlusion and stenosis of bilateral carotid arteries: Secondary | ICD-10-CM

## 2019-04-25 DIAGNOSIS — Z955 Presence of coronary angioplasty implant and graft: Secondary | ICD-10-CM | POA: Diagnosis not present

## 2019-04-26 ENCOUNTER — Other Ambulatory Visit: Payer: Self-pay | Admitting: Cardiology

## 2019-04-26 DIAGNOSIS — I6523 Occlusion and stenosis of bilateral carotid arteries: Secondary | ICD-10-CM

## 2019-04-27 ENCOUNTER — Other Ambulatory Visit: Payer: Self-pay

## 2019-04-27 ENCOUNTER — Encounter (HOSPITAL_COMMUNITY)
Admission: RE | Admit: 2019-04-27 | Discharge: 2019-04-27 | Disposition: A | Discharge status: Home / Self Care | Payer: Medicare Other | Source: Ambulatory Visit | Attending: Cardiology | Admitting: Cardiology

## 2019-04-27 DIAGNOSIS — Z955 Presence of coronary angioplasty implant and graft: Secondary | ICD-10-CM

## 2019-04-28 NOTE — Progress Notes (Addendum)
Cardiac Individual Treatment Plan  Patient Details  Name: Diane Richardson M Avera MRN: [id number] Date of Birth: 02/03/1951 Referring Provider:     CARDI[facility] REHAB [facility]  Referring Provider  Dr. Jacinto HalimGanji      Initial Encounter Date:    CARDIAC REHAB PHASE II [facility]ATION [facility]  Date  03/22/19      Visit Diagnosis: Status post coronary artery stent placement 11/23/18 S/P DES OM  Patient's Home Medications on Admission:  Current Outpatient Medications:  .  aspirin EC 81 MG tablet, Take 1 tablet (81 mg total) by mouth daily., Disp: , Rfl:  .  bismuth subsalicylate (PEPTO BISMOL) 262 MG/15ML suspension, Take 30 mLs by mouth every 6 (six) hours as needed for indigestion., Disp: , Rfl:  .  celecoxib (CELEBREX) 200 MG capsule, TAKE 1 CAPSULE(200 MG) BY MOUTH DAILY AS NEEDED, Disp: 30 capsule, Rfl: 0 .  Cholecalciferol (VITAMIN D) 50 MCG (2000 UT) CAPS, Take 400 Units by mouth daily. , Disp: , Rfl:  .  clopidogrel (PLAVIX) 75 MG tablet, Take 1 tablet (75 mg total) by mouth daily., Disp: 90 tablet, Rfl: 1 .  Coenzyme Q10 (COQ-10) 200 MG CAPS, Take 200 mg by mouth daily., Disp: , Rfl:  .  diclofenac sodium (VOLTAREN) 1 % GEL, Apply 1 application topically 4 (four) times daily as needed (pain)., Disp: , Rfl:  .  DULoxetine (CYMBALTA) 60 MG capsule, Take 120 mg by mouth daily., Disp: , Rfl:  .  ketoconazole (NIZORAL) 2 % cream, Apply 1 application topically daily as needed for irritation., Disp: , Rfl:  .  LORazepam (ATIVAN) 0.5 MG tablet, Take 1 tablet (0.5 mg total) by mouth 2 (two) times daily. (Patient taking differently: Take 0.5 mg by mouth See admin instructions. Take 0.5 mg in the morning, may take a second 0.5 mg dose as needed for anxiety), Disp: 30 tablet, Rfl: 0 .  Menthol-Zinc Oxide (GOLD BOND EX), Apply 1 application topically daily as needed (pain/itching)., Disp: ,  Rfl:  .  methocarbamol (ROBAXIN) 500 MG tablet, Take 500 mg by mouth at bedtime as needed for muscle spasms., Disp: , Rfl:  .  metoprolol succinate (TOPROL-XL) 25 MG 24 hr tablet, TAKE 1 TABLET(25 MG) BY MOUTH AT BEDTIME, Disp: 90 tablet, Rfl: 1 .  nitroGLYCERIN (NITROSTAT) 0.4 MG SL tablet, Place 0.4 mg under the tongue every 5 (five) minutes as needed for chest pain., Disp: , Rfl:  .  omeprazole (PRILOSEC) 40 MG capsule, Take 40 mg by mouth daily., Disp: , Rfl:  .  QUEtiapine (SEROQUEL XR) 50 MG TB24 24 hr tablet, Take 50 mg by mouth., Disp: , Rfl:  .  rosuvastatin (CRESTOR) 20 MG tablet, Take 1 tablet (20 mg total) by mouth daily., Disp: 30 tablet, Rfl: 3  Past Medical History: Past Medical History:  Diagnosis Date  . ADD (attention deficit disorder)   . Anxiety   . Coronary artery disease   . Depression   . DVT (deep venous thrombosis) (HCC)   . Fatigue    chronic  . Fibromyalgia   . Hyperlipidemia   . Muscle spasm    bad  . PE (pulmonary embolism)   . PTSD (post-traumatic stress disorder)     Tobacco Use: Social History   Tobacco Use  Smoking Status Never Smoker  Smokeless Tobacco Never Used    Labs: Recent Review Contractorlowsheet Data    Labs for  ITP Cardiac and Pulmonary Rehab Latest Ref Rng & Units 03/22/2019   Cholestrol 100 - 199 mg/dL 161(W)   LDLCALC 0 - 99 mg/dL 90   HDL >96 mg/dL 68   Trlycerides 0 - 045 mg/dL 409(W)      Capillary Blood Glucose: No results found for: GLUCAP   Exercise Target Goals: Exercise Program Goal: Individual exercise prescription set using results from initial 6 min walk test and THRR while considering  patient's activity barriers and safety.   Exercise Prescription Goal: Initial exercise prescription builds to 30-45 minutes a day of aerobic activity, 2-3 days per week.  Home exercise guidelines will be given to patient during program as part of exercise prescription that the participant will acknowledge.  Activity Barriers & Risk  Stratification: Activity Barriers & Cardiac Risk Stratification - 03/22/19 1526      Activity Barriers & Cardiac Risk Stratification   Activity Barriers  History of Falls;Balance Concerns;Joint Problems;Deconditioning;Other (comment)    Comments  Right hip and leg pain    Cardiac Risk Stratification  High       6 Minute Walk: 6 Minute Walk    Row Name 03/22/19 1526         6 Minute Walk   Phase  Initial     Distance  1487 feet     Walk Time  6 minutes     # of Rest Breaks  0     MPH  2.8     METS  3.2     RPE  11     Perceived Dyspnea   0     VO2 Peak  11.46     Symptoms  No     Resting HR  72 bpm     Resting BP  102/70     Resting Oxygen Saturation   95 %     Exercise Oxygen Saturation  during 6 min walk  100 %     Max Ex. HR  98 bpm     Max Ex. BP  133/53     2 Minute Post BP  102/62        Oxygen Initial Assessment:   Oxygen Re-Evaluation:   Oxygen Discharge (Final Oxygen Re-Evaluation):   Initial Exercise Prescription: Initial Exercise Prescription - 03/22/19 1500      Date of Initial Exercise RX and Referring Provider   Date  03/22/19    Referring Provider  Dr. Jacinto Halim    Expected Discharge Date  05/06/19      NuStep   Level  2    SPM  75    Minutes  15    METs  3      Arm Ergometer   Level  1    Watts  35    Minutes  15    METs  2.8      Prescription Details   Frequency (times per week)  3    Duration  Progress to 30 minutes of continuous aerobic without signs/symptoms of physical distress      Intensity   THRR 40-80% of Max Heartrate  61-122    Ratings of Perceived Exertion  11-13      Progression   Progression  Continue to progress workloads to maintain intensity without signs/symptoms of physical distress.      Resistance Training   Training Prescription  Yes    Weight  2 lbs.     Reps  10-15       Perform Capillary Blood  Glucose checks as needed.  Exercise Prescription Changes: Exercise Prescription Changes    Row Name  03/30/19 1200 04/22/19 1112           Response to Exercise   Blood Pressure (Admit)  112/56  104/68      Blood Pressure (Exercise)  124/70  116/60      Blood Pressure (Exit)  102/64  122/72      Heart Rate (Admit)  68 bpm  72 bpm      Heart Rate (Exercise)  79 bpm  89 bpm      Heart Rate (Exit)  68 bpm  72 bpm      Rating of Perceived Exertion (Exercise)  12  11      Symptoms  None  None      Comments  Pt first day of exercise.   -      Duration  Continue with 30 min of aerobic exercise without signs/symptoms of physical distress.  Continue with 30 min of aerobic exercise without signs/symptoms of physical distress.      Intensity  THRR unchanged  THRR unchanged        Progression   Progression  Continue to progress workloads to maintain intensity without signs/symptoms of physical distress.  Continue to progress workloads to maintain intensity without signs/symptoms of physical distress.      Average METs  2.4  2.1        Resistance Training   Training Prescription  No  Yes      Weight  -  3lbs      Reps  -  10-15      Time  -  10 Minutes        Interval Training   Interval Training  -  No        Recumbant Bike   Level  -  2      Minutes  -  15      METs  -  2.3        NuStep   Level  2  3      SPM  75  75      Minutes  15  15      METs  1.8  1.9        Arm Ergometer   Level  1  -      Watts  25  -      Minutes  15  -      METs  3.09  -        Home Exercise Plan   Plans to continue exercise at  -  Home (comment) Walking, yoga      Frequency  -  Add 2 additional days to program exercise sessions.      Initial Home Exercises Provided  -  04/22/19         Exercise Comments: Exercise Comments    Row Name 03/30/19 1205 04/22/19 1123         Exercise Comments  Pt first day of exercise. Pt tolerated exercise well.  Reviewed home exercise guidelines and goals with patient.         Exercise Goals and Review: Exercise Goals    Row Name 03/22/19 1530              Exercise Goals   Increase Physical Activity  Yes       Intervention  Provide advice, education, support and counseling about physical activity/exercise needs.;Develop an individualized exercise  prescription for aerobic and resistive training based on initial evaluation findings, risk stratification, comorbidities and participant's personal goals.       Expected Outcomes  Short Term: Attend rehab on a regular basis to increase amount of physical activity.;Long Term: Add in home exercise to make exercise part of routine and to increase amount of physical activity.;Long Term: Exercising regularly at least 3-5 days a week.       Increase Strength and Stamina  Yes       Intervention  Provide advice, education, support and counseling about physical activity/exercise needs.;Develop an individualized exercise prescription for aerobic and resistive training based on initial evaluation findings, risk stratification, comorbidities and participant's personal goals.       Expected Outcomes  Short Term: Increase workloads from initial exercise prescription for resistance, speed, and METs.;Short Term: Perform resistance training exercises routinely during rehab and add in resistance training at home;Long Term: Improve cardiorespiratory fitness, muscular endurance and strength as measured by increased METs and functional capacity ( )       Able to understand and use rate of perceived exertion (RPE) scale  Yes       Intervention  Provide education and explanation on how to use RPE scale       Expected Outcomes  Short Term: Able to use RPE daily in rehab to express subjective intensity level;Long Term:  Able to use RPE to guide intensity level when exercising independently       Knowledge and understanding of Target Heart Rate Range (THRR)  Yes       Intervention  Provide education and explanation of THRR including how the numbers were predicted and where they are located for reference       Expected Outcomes   Short Term: Able to state/look up THRR;Long Term: Able to use THRR to govern intensity when exercising independently;Short Term: Able to use daily as guideline for intensity in rehab       Able to check pulse independently  Yes       Intervention  Provide education and demonstration on how to check pulse in carotid and radial arteries.;Review the importance of being able to check your own pulse for safety during independent exercise       Expected Outcomes  Short Term: Able to explain why pulse checking is important during independent exercise;Long Term: Able to check pulse independently and accurately       Understanding of Exercise Prescription  Yes       Intervention  Provide education, explanation, and written materials on patient's individual exercise prescription       Expected Outcomes  Short Term: Able to explain program exercise prescription;Long Term: Able to explain home exercise prescription to exercise independently          Exercise Goals Re-Evaluation : Exercise Goals Re-Evaluation    Row Name 03/30/19 1204 04/22/19 1123           Exercise Goal Re-Evaluation   Exercise Goals Review  Increase Physical Activity;Increase Strength and Stamina;Able to understand and use rate of perceived exertion (RPE) scale;Knowledge and understanding of Target Heart Rate Range (THRR);Understanding of Exercise Prescription  Increase Physical Activity;Increase Strength and Stamina;Able to understand and use rate of perceived exertion (RPE) scale;Knowledge and understanding of Target Heart Rate Range (THRR);Understanding of Exercise Prescription      Comments  Pt first day of exercise in CR program. pt tolerated exercise well. Pt understands THRR, exercise prescription, and RPE scale.  Reviewed home exercise guidelines with patient.  Patient is walking 30-60, minutes, and/or doing yoga 15-30 minutes 2-3 days/week as her mode of home exercise. Pt also has handweighs at home. Instructed pt on pulse coutning  and will provide handout. Pt's goal is to return to swimming at the pool in addition to her other exercise modalities.      Expected Outcomes  Will continue to monitor and progress Pt as tolerated.  Patient will continue home exercise routine, walking or practicing yoga, 30-60 minutes, 2-3 days/week to help achieve personal health and fitness goals.         Discharge Exercise Prescription (Final Exercise Prescription Changes): Exercise Prescription Changes - 04/22/19 1112      Response to Exercise   Blood Pressure (Admit)  104/68    Blood Pressure (Exercise)  116/60    Blood Pressure (Exit)  122/72    Heart Rate (Admit)  72 bpm    Heart Rate (Exercise)  89 bpm    Heart Rate (Exit)  72 bpm    Rating of Perceived Exertion (Exercise)  11    Symptoms  None    Duration  Continue with 30 min of aerobic exercise without signs/symptoms of physical distress.    Intensity  THRR unchanged      Progression   Progression  Continue to progress workloads to maintain intensity without signs/symptoms of physical distress.    Average METs  2.1      Resistance Training   Training Prescription  Yes    Weight  3lbs    Reps  10-15    Time  10 Minutes      Interval Training   Interval Training  No      Recumbant Bike   Level  2    Minutes  15    METs  2.3      NuStep   Level  3    SPM  75    Minutes  15    METs  1.9      Arm Ergometer   Level  --    Watts  --    Minutes  --    METs  --      Home Exercise Plan   Plans to continue exercise at  Home (comment)   Walking, yoga   Frequency  Add 2 additional days to program exercise sessions.    Initial Home Exercises Provided  04/22/19       Nutrition:  Target Goals: Understanding of nutrition guidelines, daily intake of sodium 1500mg , cholesterol 200mg , calories 30% from fat and 7% or less from saturated fats, daily to have 5 or more servings of fruits and vegetables.  Biometrics: Pre Biometrics - 03/22/19 1532      Pre  Biometrics   Height  5' 0.25" (1.53 m)    Weight  63.1 kg    Waist Circumference  32.5 inches    Hip Circumference  41 inches    Waist to Hip Ratio  0.79 %    BMI (Calculated)  26.96    Triceps Skinfold  41 mm    % Body Fat  40.3 %    Grip Strength  24 kg    Flexibility  16.5 in    Single Leg Stand  10.25 seconds        Nutrition Therapy Plan and Nutrition Goals:   Nutrition Assessments:   Nutrition Goals Re-Evaluation:   Nutrition Goals Re-Evaluation:   Nutrition Goals Discharge (Final Nutrition Goals Re-Evaluation):   Psychosocial: Target Goals: Acknowledge presence  or absence of significant depression and/or stress, maximize coping skills, provide positive support system. Participant is able to verbalize types and ability to use techniques and skills needed for reducing stress and depression.  Initial Review & Psychosocial Screening: Initial Psych Review & Screening - 03/22/19 1631      Initial Review   Current issues with  History of Depression      Family Dynamics   Good Support System?  Yes   Lorene DyChristie has her husband for support   Comments  Chrisitie has a history of depression takes seroquel and cymbalta      Barriers   Psychosocial barriers to participate in program  Psychosocial barriers identified (see note)      Screening Interventions   Interventions  Encouraged to exercise;Provide feedback about the scores to participant       Quality of Life Scores: Quality of Life - 03/22/19 1533      Quality of Life   Select  Quality of Life      Quality of Life Scores   Health/Function Pre  15.04 %    Socioeconomic Pre  19 %    Psych/Spiritual Pre  18.79 %    Family Pre  22.9 %    GLOBAL Pre  17.86 %      Scores of 19 and below usually indicate a poorer quality of life in these areas.  A difference of  2-3 points is a clinically meaningful difference.  A difference of 2-3 points in the total score of the Quality of Life Index has been associated with  significant improvement in overall quality of life, self-image, physical symptoms, and general health in studies assessing change in quality of life.  PHQ-9: Recent Review Flowsheet Data    [facility] 03/22/2019 05/10/2015   Decreased Interest 0 1   Down, Depressed, Hopeless 0 0   PHQ - 2 Score 0 1     Interpretation of Total Score  Total Score Depression Severity:  1-4 = Minimal depression, 5-9 = Mild depression, 10-14 = Moderate depression, 15-19 = Moderately severe depression, 20-27 = Severe depression   Psychosocial Evaluation and Intervention: Psychosocial Evaluation - 03/30/19 1333      Psychosocial Evaluation & Interventions   Interventions  Encouraged to exercise with the program and follow exercise prescription;Relaxation education;Stress management education    Comments  Lorene DyChristie has depression but it is manageable. She enjoys riding horses.    Expected Outcomes  Lorene DyChristie will report the ability to manage her depression.    Continue Psychosocial Services   Follow up required by staff       Psychosocial Re-Evaluation: Psychosocial Re-Evaluation    Row Name 04/27/19 1010             Psychosocial Re-Evaluation   Current issues with  Current Depression       Comments  Lorene DyChristie reports depression but that it is manageable.       Expected Outcomes  Lorene DyChristie will continue to report the ability to manage her depression.       Interventions  Stress management education;Relaxation education;Encouraged to attend Cardiac Rehabilitation for the exercise       Continue Psychosocial Services   Follow up required by staff          Psychosocial Discharge (Final Psychosocial Re-Evaluation): Psychosocial Re-Evaluation - 04/27/19 1010      Psychosocial Re-Evaluation   Current issues with  Current Depression    Comments  Lorene DyChristie reports depression but that it is  manageable.    Expected Outcomes  Zarianna will continue to report the ability to manage her depression.     Interventions  Stress management education;Relaxation education;Encouraged to attend Cardiac Rehabilitation for the exercise    Continue Psychosocial Services   Follow up required by staff       Vocational Rehabilitation: Provide vocational rehab assistance to qualifying candidates.   Vocational Rehab Evaluation & Intervention: Vocational Rehab - 03/22/19 1636      Initial Vocational Rehab Evaluation & Intervention   Assessment shows need for Vocational Rehabilitation  No       Education: Education Goals: Education classes will be provided on a weekly basis, covering required topics. Participant will state understanding/return demonstration of topics presented.  Learning Barriers/Preferences: Learning Barriers/Preferences - 03/22/19 1534      Learning Barriers/Preferences   Learning Barriers  None    Learning Preferences  Audio;Computer/Internet;Group Instruction;Individual Instruction;Skilled Demonstration;Pictoral;Verbal Instruction;Video;Written Material       Education Topics: Count Your Pulse:  -Group instruction provided by verbal instruction, demonstration, patient participation and written materials to support subject.  Instructors address importance of being able to find your pulse and how to count your pulse when at home without a heart monitor.  Patients get hands on experience counting their pulse with staff help and individually.   Heart Attack, Angina, and Risk Factor Modification:  -Group instruction provided by verbal instruction, video, and written materials to support subject.  Instructors address signs and symptoms of angina and heart attacks.    Also discuss risk factors for heart disease and how to make changes to improve heart health risk factors.   Functional Fitness:  -Group instruction provided by verbal instruction, demonstration, patient participation, and written materials to support subject.  Instructors address safety measures for doing things  around the house.  Discuss how to get up and down off the floor, how to pick things up properly, how to safely get out of a chair without assistance, and balance training.   Meditation and Mindfulness:  -Group instruction provided by verbal instruction, patient participation, and written materials to support subject.  Instructor addresses importance of mindfulness and meditation practice to help reduce stress and improve awareness.  Instructor also leads participants through a meditation exercise.    Stretching for Flexibility and Mobility:  -Group instruction provided by verbal instruction, patient participation, and written materials to support subject.  Instructors lead participants through series of stretches that are designed to increase flexibility thus improving mobility.  These stretches are additional exercise for major muscle groups that are typically performed during regular warm up and cool down.   Hands Only CPR:  -Group verbal, video, and participation provides a basic overview of AHA guidelines for community CPR. Role-play of emergencies allow participants the opportunity to practice calling for help and chest compression technique with discussion of AED use.   Hypertension: -Group verbal and written instruction that provides a basic overview of hypertension including the most recent diagnostic guidelines, risk factor reduction with self-care instructions and medication management.    Nutrition I class: Heart Healthy Eating:  -Group instruction provided by PowerPoint slides, verbal discussion, and written materials to support subject matter. The instructor gives an explanation and review of the Therapeutic Lifestyle Changes diet recommendations, which includes a discussion on lipid goals, dietary fat, sodium, fiber, plant stanol/sterol esters, sugar, and the components of a well-balanced, healthy diet.   Nutrition II class: Lifestyle Skills:  -Group instruction provided by  PowerPoint slides, verbal discussion, and written materials  to support subject matter. The instructor gives an explanation and review of label reading, grocery shopping for heart health, heart healthy recipe modifications, and ways to make healthier choices when eating out.   Diabetes Question & Answer:  -Group instruction provided by PowerPoint slides, verbal discussion, and written materials to support subject matter. The instructor gives an explanation and review of diabetes co-morbidities, pre- and post-prandial blood glucose goals, pre-exercise blood glucose goals, signs, symptoms, and treatment of hypoglycemia and hyperglycemia, and foot care basics.   Diabetes Blitz:  -Group instruction provided by PowerPoint slides, verbal discussion, and written materials to support subject matter. The instructor gives an explanation and review of the physiology behind type 1 and type 2 diabetes, diabetes medications and rational behind using different medications, pre- and post-prandial blood glucose recommendations and Hemoglobin A1c goals, diabetes diet, and exercise including blood glucose guidelines for exercising safely.    Portion Distortion:  -Group instruction provided by PowerPoint slides, verbal discussion, written materials, and food models to support subject matter. The instructor gives an explanation of serving size versus portion size, changes in portions sizes over the last 20 years, and what consists of a serving from each food group.   Stress Management:  -Group instruction provided by verbal instruction, video, and written materials to support subject matter.  Instructors review role of stress in heart disease and how to cope with stress positively.     Exercising on Your Own:  -Group instruction provided by verbal instruction, power point, and written materials to support subject.  Instructors discuss benefits of exercise, components of exercise, frequency and intensity of exercise,  and end points for exercise.  Also discuss use of nitroglycerin and activating EMS.  Review options of places to exercise outside of rehab.  Review guidelines for sex with heart disease.   Cardiac Drugs I:  -Group instruction provided by verbal instruction and written materials to support subject.  Instructor reviews cardiac drug classes: antiplatelets, anticoagulants, beta blockers, and statins.  Instructor discusses reasons, side effects, and lifestyle considerations for each drug class.   Cardiac Drugs II:  -Group instruction provided by verbal instruction and written materials to support subject.  Instructor reviews cardiac drug classes: angiotensin converting enzyme inhibitors (ACE-I), angiotensin II receptor blockers (ARBs), nitrates, and calcium channel blockers.  Instructor discusses reasons, side effects, and lifestyle considerations for each drug class.   Anatomy and Physiology of the Circulatory System:  Group verbal and written instruction and models provide basic cardiac anatomy and physiology, with the coronary electrical and arterial systems. Review of: AMI, Angina, Valve disease, Heart Failure, Peripheral Artery Disease, Cardiac Arrhythmia, Pacemakers, and the ICD.   Other Education:  -Group or individual verbal, written, or video instructions that support the educational goals of the cardiac rehab program.   Holiday Eating Survival Tips:  -Group instruction provided by PowerPoint slides, verbal discussion, and written materials to support subject matter. The instructor gives patients tips, tricks, and techniques to help them not only survive but enjoy the holidays despite the onslaught of food that accompanies the holidays.   Knowledge Questionnaire Score: Knowledge Questionnaire Score - 03/22/19 1535      Knowledge Questionnaire Score   Pre Score  20/24       Core Components/Risk Factors/Patient Goals at Admission: Personal Goals and Risk Factors at Admission -  03/22/19 1535      Core Components/Risk Factors/Patient Goals on Admission    Weight Management  Yes;Obesity;Weight Maintenance;Weight Loss    Admit Weight  139 lb  1.8 oz (63.1 kg)    Lipids  Yes    Intervention  Provide education and support for participant on nutrition & aerobic/resistive exercise along with prescribed medications to achieve LDL 70mg , HDL >40mg .    Expected Outcomes  Short Term: Participant states understanding of desired cholesterol values and is compliant with medications prescribed. Participant is following exercise prescription and nutrition guidelines.;Long Term: Cholesterol controlled with medications as prescribed, with individualized exercise RX and with personalized nutrition plan. Value goals: LDL < 70mg , HDL > 40 mg.    Stress  Yes    Intervention  Offer individual and/or small group education and counseling on adjustment to heart disease, stress management and health-related lifestyle change. Teach and support self-help strategies.;Refer participants experiencing significant psychosocial distress to appropriate mental health specialists for further evaluation and treatment. When possible, include family members and significant others in education/counseling sessions.       Core Components/Risk Factors/Patient Goals Review:  Goals and Risk Factor Review    Row Name 03/30/19 1359 04/27/19 1007           Core Components/Risk Factors/Patient Goals Review   Personal Goals Review  Weight Management/Obesity;Stress;Lipids  Weight Management/Obesity;Stress;Lipids      Review  Pt with few CAD RFs willing to participate in CR exercise.  Jemia would like to increase her core strength.  30 Day ITP Review. Minna continues to tolerate exercise well.  She feels that she is increasing her strength.      Expected Outcomes  Pt will continue to participate in CR exercise, nutrition, and lifestyle modification opportunities.  Pt will continue to participate in CR exercise,  nutrition, and lifestyle modification opportunities.         Core Components/Risk Factors/Patient Goals at Discharge (Final Review):  Goals and Risk Factor Review - 04/27/19 1007      Core Components/Risk Factors/Patient Goals Review   Personal Goals Review  Weight Management/Obesity;Stress;Lipids    Review  30 Day ITP Review. Masa continues to tolerate exercise well.  She feels that she is increasing her strength.    Expected Outcomes  Pt will continue to participate in CR exercise, nutrition, and lifestyle modification opportunities.       ITP Comments: ITP Comments    Row Name 03/22/19 1410 03/30/19 1332 04/27/19 1004       ITP Comments  Dr. Fransico Him, Medical Director  30 Day ITP Review.  Pt started exercise today and tolerated it well.  30 Day ITP Review. Kristeena continues to tolerate exercise well.  She feels that she is increasing her strength.        Comments: See ITP Comments.

## 2019-04-29 ENCOUNTER — Encounter (HOSPITAL_COMMUNITY)
Admission: RE | Admit: 2019-04-29 | Discharge: 2019-04-29 | Disposition: A | Discharge status: Home / Self Care | Payer: Medicare Other | Source: Ambulatory Visit | Attending: Cardiology | Admitting: Cardiology

## 2019-04-29 ENCOUNTER — Other Ambulatory Visit: Payer: Self-pay

## 2019-04-29 DIAGNOSIS — Z955 Presence of coronary angioplasty implant and graft: Secondary | ICD-10-CM

## 2019-05-02 ENCOUNTER — Other Ambulatory Visit: Payer: Self-pay

## 2019-05-02 ENCOUNTER — Encounter (HOSPITAL_COMMUNITY)
Admission: RE | Admit: 2019-05-02 | Discharge: 2019-05-02 | Disposition: A | Discharge status: Home / Self Care | Payer: Medicare Other | Source: Ambulatory Visit | Attending: Cardiology | Admitting: Cardiology

## 2019-05-02 DIAGNOSIS — Z955 Presence of coronary angioplasty implant and graft: Secondary | ICD-10-CM | POA: Diagnosis not present

## 2019-05-04 ENCOUNTER — Encounter (HOSPITAL_COMMUNITY)
Admission: RE | Admit: 2019-05-04 | Discharge: 2019-05-04 | Disposition: A | Discharge status: Home / Self Care | Payer: Medicare Other | Source: Ambulatory Visit | Attending: Cardiology | Admitting: Cardiology

## 2019-05-04 ENCOUNTER — Other Ambulatory Visit: Payer: Self-pay

## 2019-05-04 DIAGNOSIS — Z955 Presence of coronary angioplasty implant and graft: Secondary | ICD-10-CM

## 2019-05-06 ENCOUNTER — Other Ambulatory Visit: Payer: Self-pay

## 2019-05-06 ENCOUNTER — Encounter (HOSPITAL_COMMUNITY)
Admission: RE | Admit: 2019-05-06 | Discharge: 2019-05-06 | Disposition: A | Discharge status: Home / Self Care | Payer: Medicare Other | Source: Ambulatory Visit | Attending: Cardiology | Admitting: Cardiology

## 2019-05-06 VITALS — BP 102/60 | HR 78 | Temp 97.2°F | Ht 60.25 in | Wt 140.2 lb

## 2019-05-06 DIAGNOSIS — Z955 Presence of coronary angioplasty implant and graft: Secondary | ICD-10-CM

## 2019-05-06 NOTE — Progress Notes (Signed)
Discharge Progress Report  Patient Details  Name: Diane Richardson MRN: [id number] Date of Birth: 04-01-1951 Referring Provider:     CARDIAC REHAB PHASE II ORIENTATION from 03/22/2019 in [place]  Referring Provider  Dr. Einar Gip       Number of Visits: 16  Reason for Discharge:  Patient reached a stable level of exercise. Patient independent in their exercise. Patient has met program and personal goals.  Smoking History:  Social History   Tobacco Use  Smoking Status Never Smoker  Smokeless Tobacco Never Used    Diagnosis:  Status post coronary artery stent placement 11/23/18 S/P DES OM  ADL [facility]:   Initial Exercise Prescription: Initial Exercise Prescription - 03/22/19 1500      Date of Initial Exercise RX and Referring Provider   Date  03/22/19    Referring Provider  Dr. Einar Gip    Expected Discharge Date  05/06/19      NuStep   Level  2    SPM  75    Minutes  15    METs  3      Arm Ergometer   Level  1    Watts  35    Minutes  15    METs  2.8      Prescription Details   Frequency (times per week)  3    Duration  Progress to 30 minutes of continuous aerobic without signs/symptoms of physical distress      Intensity   THRR 40-80% of Max Heartrate  61-122    Ratings of Perceived Exertion  11-13      Progression   Progression  Continue to progress workloads to maintain intensity without signs/symptoms of physical distress.      Resistance Training   Training Prescription  Yes    Weight  2 lbs.     Reps  10-15       Discharge Exercise Prescription (Final Exercise Prescription Changes): Exercise Prescription Changes - 05/06/19 1102      Response to Exercise   Blood Pressure (Admit)  102/60    Blood Pressure (Exercise)  138/72    Blood Pressure (Exit)  100/62    Heart Rate (Admit)  78 bpm    Heart Rate (Exercise)  103 bpm    Heart Rate (Exit)  78 bpm    Rating of Perceived Exertion (Exercise)  13    Symptoms  None     Duration  Continue with 30 min of aerobic exercise without signs/symptoms of physical distress.    Intensity  THRR unchanged      Progression   Progression  Continue to progress workloads to maintain intensity without signs/symptoms of physical distress.    Average METs  2.5      Resistance Training   Training Prescription  Yes    Weight  3lbs    Reps  10-15    Time  10 Minutes      Interval Training   Interval Training  No      Recumbant Bike   Level  2.5    Minutes  15    METs  2.4      NuStep   Level  4    SPM  75    Minutes  15    METs  2.5      Home Exercise Plan   Plans to continue exercise at  Home (comment)   Walking, yoga   Frequency  Add 2 additional days to  program exercise sessions.    Initial Home Exercises Provided  04/22/19       Functional Capacity: 6 Minute Walk    Row Name 03/22/19 1526 05/02/19 1108       6 Minute Walk   Phase  Initial  Discharge    Distance  1487 feet  1455 feet    Distance % Change  -  2.15 %    Walk Time  6 minutes  6 minutes    # of Rest Breaks  0  0    MPH  2.8  2.76    METS  3.2  3.07    RPE  11  12    Perceived Dyspnea   0  0    VO2 Peak  11.46  10.74    Symptoms  No  No    Resting HR  72 bpm  65 bpm    Resting BP  102/70  94/54    Resting Oxygen Saturation   95 %  -    Exercise Oxygen Saturation  during 6 min walk  100 %  -    Max Ex. HR  98 bpm  93 bpm    Max Ex. BP  133/53  124/64    2 Minute Post BP  102/62  102/62       Psychological, QOL, Others - Outcomes: PHQ 2/9: Depression screen [facility] 2/9 05/06/2019 03/22/2019 05/10/2015  Decreased Interest 0 0 1  Down, Depressed, Hopeless 0 0 0  PHQ - 2 Score 0 0 1    Quality of Life: Quality of Life - 05/04/19 1215      Quality of Life   Select  Quality of Life      Quality of Life Scores   Health/Function Pre  15.04 %    Health/Function Post  18.46 %    Health/Function % Change  22.74 %    Socioeconomic Pre  19 %    Socioeconomic Post  20.29 %     Socioeconomic % Change   6.79 %    Psych/Spiritual Pre  18.79 %    Psych/Spiritual Post  23.43 %    Psych/Spiritual % Change  24.69 %    Family Pre  22.9 %    Family Post  22.9 %    Family % Change  0 %    GLOBAL Pre  17.86 %    GLOBAL Post  20.58 %    GLOBAL % Change  15.23 %       Personal Goals: Goals established at orientation with interventions provided to work toward goal. Personal Goals and Risk Factors at Admission - 03/22/19 1535      Core Components/Risk Factors/Patient Goals on Admission    Weight Management  Yes;Obesity;Weight Maintenance;Weight Loss    Admit Weight  139 lb 1.8 oz (63.1 kg)    Lipids  Yes    Intervention  Provide education and support for participant on nutrition & aerobic/resistive exercise along with prescribed medications to achieve LDL <35m, HDL >436m    Expected Outcomes  Short Term: Participant states understanding of desired cholesterol values and is compliant with medications prescribed. Participant is following exercise prescription and nutrition guidelines.;Long Term: Cholesterol controlled with medications as prescribed, with individualized exercise RX and with personalized nutrition plan. Value goals: LDL < 7042mHDL > 40 mg.    Stress  Yes    Intervention  Offer individual and/or small group education and counseling on adjustment to heart disease, stress management and  health-related lifestyle change. Teach and support self-help strategies.;Refer participants experiencing significant psychosocial distress to appropriate mental health specialists for further evaluation and treatment. When possible, include family members and significant others in education/counseling sessions.        Personal Goals Discharge: Goals and Risk Factor Review    Row Name 03/30/19 1359 04/27/19 1007 05/06/19 1637         Core Components/Risk Factors/Patient Goals Review   Personal Goals Review  Weight Management/Obesity;Stress;Lipids  Weight  Management/Obesity;Stress;Lipids  -     Review  Pt with few CAD RFs willing to participate in CR exercise.  Diane Richardson would like to increase her core strength.  30 Day ITP Review. Diane Richardson continues to tolerate exercise well.  She feels that she is increasing her strength.  Diane Richardson graduated from [employer] with 16 completed sessions.  She tolerated exercise well.  She felt that she had increased her strength.     Expected Outcomes  Pt will continue to participate in CR exercise, nutrition, and lifestyle modification opportunities.  Pt will continue to participate in CR exercise, nutrition, and lifestyle modification opportunities.  Pt will continue to participate in exercise, nutrition, and lifestyle modification opportunities.  She plans to return to horseback riding and walk for exercise.        Exercise Goals and Review: Exercise Goals    Row Name 03/22/19 1530             Exercise Goals   Increase Physical Activity  Yes       Intervention  Provide advice, education, support and counseling about physical activity/exercise needs.;Develop an individualized exercise prescription for aerobic and resistive training based on initial evaluation findings, risk stratification, comorbidities and participant's personal goals.       Expected Outcomes  Short Term: Attend rehab on a regular basis to increase amount of physical activity.;Long Term: Add in home exercise to make exercise part of routine and to increase amount of physical activity.;Long Term: Exercising regularly at least 3-5 days a week.       Increase Strength and Stamina  Yes       Intervention  Provide advice, education, support and counseling about physical activity/exercise needs.;Develop an individualized exercise prescription for aerobic and resistive training based on initial evaluation findings, risk stratification, comorbidities and participant's personal goals.       Expected Outcomes  Short Term: Increase workloads from initial exercise  prescription for resistance, speed, and METs.;Short Term: Perform resistance training exercises routinely during rehab and add in resistance training at home;Long Term: Improve cardiorespiratory fitness, muscular endurance and strength as measured by increased METs and functional capacity (6MWT)       Able to understand and use rate of perceived exertion (RPE) scale  Yes       Intervention  Provide education and explanation on how to use RPE scale       Expected Outcomes  Short Term: Able to use RPE daily in rehab to express subjective intensity level;Long Term:  Able to use RPE to guide intensity level when exercising independently       Knowledge and understanding of Target Heart Rate Range (THRR)  Yes       Intervention  Provide education and explanation of THRR including how the numbers were predicted and where they are located for reference       Expected Outcomes  Short Term: Able to state/look up THRR;Long Term: Able to use THRR to govern intensity when exercising independently;Short Term: Able to use daily  as guideline for intensity in rehab       Able to check pulse independently  Yes       Intervention  Provide education and demonstration on how to check pulse in carotid and radial arteries.;Review the importance of being able to check your own pulse for safety during independent exercise       Expected Outcomes  Short Term: Able to explain why pulse checking is important during independent exercise;Long Term: Able to check pulse independently and accurately       Understanding of Exercise Prescription  Yes       Intervention  Provide education, explanation, and written materials on patient's individual exercise prescription       Expected Outcomes  Short Term: Able to explain program exercise prescription;Long Term: Able to explain home exercise prescription to exercise independently          Exercise Goals Re-Evaluation: Exercise Goals Re-Evaluation    Row Name 03/30/19 1204 04/22/19  1123 05/02/19 1120 05/06/19 1154       Exercise Goal Re-Evaluation   Exercise Goals Review  Increase Physical Activity;Increase Strength and Stamina;Able to understand and use rate of perceived exertion (RPE) scale;Knowledge and understanding of Target Heart Rate Range (THRR);Understanding of Exercise Prescription  Increase Physical Activity;Increase Strength and Stamina;Able to understand and use rate of perceived exertion (RPE) scale;Knowledge and understanding of Target Heart Rate Range (THRR);Understanding of Exercise Prescription  Increase Physical Activity;Increase Strength and Stamina;Able to understand and use rate of perceived exertion (RPE) scale;Knowledge and understanding of Target Heart Rate Range (THRR);Understanding of Exercise Prescription  Increase Physical Activity;Increase Strength and Stamina;Able to understand and use rate of perceived exertion (RPE) scale;Knowledge and understanding of Target Heart Rate Range (THRR);Understanding of Exercise Prescription    Comments  Pt first day of exercise in CR program. pt tolerated exercise well. Pt understands THRR, exercise prescription, and RPE scale.  Reviewed home exercise guidelines with patient. Patient is walking 30-60, minutes, and/or doing yoga 15-30 minutes 2-3 days/week as her mode of home exercise. Pt also has handweighs at home. Instructed pt on pulse coutning and will provide handout. Pt's goal is to return to swimming at the pool in addition to her other exercise modalities.  Patient has progressed well in cardiac rehab, tolerating low intensity exercise without c/o. Pt plans to continue exercise by walking 4-5 days/week, practicing yoga 30-40 minutes, 1 day/week. Pt has 3 lb weights at home and is stretching at home. Pt has resumed swimming and typically swims [phone] yards. Pt states that her leg strength is better and that she's able to climb stairs more easily. 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  Patient completed the phase 2 cardiac rehab and has tolerated low-moderate intensity exercise well. Pt states that her legs are stronger since starting the program, and she feels like the program was very beneficial for her.    Expected Outcomes  Will continue to monitor and progress Pt as tolerated.  Patient will continue home exercise routine, walking or practicing yoga, 30-60 minutes, 2-3 days/week to help achieve personal health and fitness goals.  Patient will exercise 30-40 minutes, 5-7 days/week to help maintain health and fitness benefits.  Patient will exercise walking, swimming, and/or practicing yoga, 30-60 minutes, 4-5 days/week to help maintain health and fitness gains.       Nutrition & Weight - Outcomes: Pre Biometrics -  03/22/19 1532      Pre Biometrics   Height  5' 0.25" (1.53 m)    Weight  63.1 kg    Waist Circumference  32.5 inches    Hip Circumference  41 inches    Waist to Hip Ratio  0.79 %    BMI (Calculated)  26.96    Triceps Skinfold  41 mm    % Body Fat  40.3 %    Grip Strength  24 kg    Flexibility  16.5 in    Single Leg Stand  10.25 seconds      Post Biometrics - 05/06/19 1102       Post  Biometrics   Height  5' 0.25" (1.53 m)    Weight  63.6 kg    Waist Circumference  33 inches    Hip Circumference  41.5 inches    Waist to Hip Ratio  0.8 %    BMI (Calculated)  27.17    Triceps Skinfold  42 mm    % Body Fat  40.7 %    Grip Strength  20.5 kg    Flexibility  16.75 in    Single Leg Stand  30 seconds       Nutrition:   Nutrition Discharge:   Education Questionnaire Score: Knowledge Questionnaire Score - 05/04/19 1217      Knowledge Questionnaire Score   Pre Score  20/24    Post Score  21/24       Goals reviewed with patient; copy given to patient.

## 2019-05-06 NOTE — Progress Notes (Signed)
Left message for pt to call back  °

## 2019-05-10 ENCOUNTER — Telehealth: Payer: Self-pay

## 2019-05-15 ENCOUNTER — Other Ambulatory Visit: Payer: Self-pay | Admitting: Cardiology

## 2019-05-20 ENCOUNTER — Ambulatory Visit
Admission: RE | Admit: 2019-05-20 | Discharge: 2019-05-20 | Disposition: A | Discharge status: Home / Self Care | Payer: Medicare Other | Source: Ambulatory Visit | Attending: Family Medicine | Admitting: Family Medicine

## 2019-05-20 ENCOUNTER — Other Ambulatory Visit: Payer: Self-pay | Admitting: Family Medicine

## 2019-05-20 ENCOUNTER — Other Ambulatory Visit: Payer: Medicare Other

## 2019-05-20 ENCOUNTER — Other Ambulatory Visit: Payer: Self-pay

## 2019-05-20 DIAGNOSIS — S0990XA Unspecified injury of head, initial encounter: Secondary | ICD-10-CM

## 2019-05-25 ENCOUNTER — Other Ambulatory Visit: Payer: Self-pay

## 2019-06-09 NOTE — Telephone Encounter (Signed)
error 

## 2019-06-24 ENCOUNTER — Other Ambulatory Visit: Payer: Self-pay | Admitting: Cardiology

## 2019-06-29 ENCOUNTER — Other Ambulatory Visit: Payer: Self-pay

## 2019-06-29 ENCOUNTER — Encounter: Payer: Self-pay | Admitting: Cardiology

## 2019-06-29 ENCOUNTER — Ambulatory Visit: Payer: Medicare Other | Admitting: Cardiology

## 2019-06-29 VITALS — BP 116/62 | HR 67 | Temp 94.3°F | Ht 61.0 in | Wt 139.9 lb

## 2019-06-29 DIAGNOSIS — E782 Mixed hyperlipidemia: Secondary | ICD-10-CM | POA: Diagnosis not present

## 2019-06-29 DIAGNOSIS — I2510 Atherosclerotic heart disease of native coronary artery without angina pectoris: Secondary | ICD-10-CM

## 2019-06-29 DIAGNOSIS — I6523 Occlusion and stenosis of bilateral carotid arteries: Secondary | ICD-10-CM

## 2019-06-29 MED ORDER — ROSUVASTATIN CALCIUM 20 MG PO TABS: ORAL_TABLET | ORAL | 3 refills | Status: DC

## 2019-06-29 MED ORDER — NITROGLYCERIN 0.4 MG SL SUBL: 0.4000 mg | SUBLINGUAL_TABLET | SUBLINGUAL | 3 refills | Status: DC | PRN

## 2019-06-29 NOTE — Progress Notes (Signed)
Primary Physician:  Merri BrunetteSmith, Candace, MD   Patient ID: Diane Richardson, female    DOB: 10/27/1950, 68 y.o.   MRN: [id number]  Subjective:    Chief Complaint  Patient presents with  . Coronary Artery Disease  . Hyperlipidemia  . Follow-up    discuss labs   HPI: Diane LuxChristie M Kolarik  is a 68 y.o. female  with history of fibromyalgia, depression, bipolar disorder, hyperlipidemia, and CAD s/p cath 11/23/2018 with stenting to OM1 and bilateral carotid stenosis.  She underwent exercise nuclear stress test on 10/25/2018 with EKG changes, but normal perfusion imaging.  3 times daily was mildly increased at 1.1 and study was considered intermediate risk. Echocardiogram on 10/29/2018 revealed normal LVEF.  She was noted to have greater than 50% stenosis in the right external carotid artery, 50 to 69% stenosis in left internal carotid, mild mixed plaque in the left common carotid by carotid duplex on 10/29/2018.  Due to continued symptoms angina that was activity limiting, underwent coronary angiogram on 11/23/2018 revealing focal high-grade 99% stenosis in the very large OM1, otherwise tortuous LAD and circumflex coronary artery, no significant disease. S/P stenting of OM1 with 2.5 x 15 mm Orsiro DES. She is now on plavix and ASA.   Patient is here on a 8374-month office visit and follow-up for CAD.  She is doing well without any complaints.  She is graduated from cardiac rehab, which she tolerated very well.  She initially had some dyspnea on exertion with walking uphill, but this is since improved.  She does notice she does have to slow down with walking uphill but continues to feel that this is also getting better.  She has not had any exertional chest pain.  Hypertension has been well controlled.  She was changed to Crestor at her last office visit, admits to some confusion with her medications and was actually off her cholesterol medicine for 2 months, but has recently started back on her Crestor.  Past  Medical History:  Diagnosis Date  . ADD (attention deficit disorder)   . Anxiety   . Coronary artery disease   . Depression   . DVT (deep venous thrombosis) (HCC)   . Fatigue    chronic  . Fibromyalgia   . Hyperlipidemia   . Muscle spasm    bad  . PE (pulmonary embolism)   . PTSD (post-traumatic stress disorder)     Past Surgical History:  Procedure Laterality Date  . BUNIONECTOMY    . CARDIAC CATHETERIZATION    . childbirth  1989   x1,NVD  . CORONARY STENT INTERVENTION N/A 11/23/2018   Procedure: CORONARY STENT INTERVENTION;  Surgeon: Yates DecampGanji, Jay, MD;  Location: [facility];  Service: Cardiovascular;  Laterality: N/A;  OM  . FOOT SURGERY Right    pt states she had bunion surgery right foot.  Marland Kitchen. HAND SURGERY    . LEFT HEART CATH AND CORONARY ANGIOGRAPHY N/A 11/23/2018   Procedure: LEFT HEART CATH AND CORONARY ANGIOGRAPHY;  Surgeon: Yates DecampGanji, Jay, MD;  Location: [facility];  Service: Cardiovascular;  Laterality: N/A;  . PULMONARY EMBOLISM SURGERY  1983   x9 days    Social History   Socioeconomic History  . Marital status: Married    Spouse name: Not on file  . Number of children: 1  . Years of education: PHD  . Highest education level: Not on file  Occupational History  . Occupation: disabled    Comment: FORMER TEACHER  Social Needs  .  Financial resource strain: Not hard at all  . Food insecurity    Worry: Never true    Inability: Never true  . Transportation needs    Medical: No    Non-medical: No  Tobacco Use  . Smoking status: Never Smoker  . Smokeless tobacco: Never Used  Substance and Sexual Activity  . Alcohol use: Yes    Comment: 3 glasses of wine weekly  . Drug use: Never  . Sexual activity: Yes  Lifestyle  . Physical activity    Days per week: 4 days    Minutes per session: 30 min  . Stress: To some extent  Relationships  . Social Musician on phone: Not on file    Gets together: Not on file    Attends religious service:  Not on file    Active member of club or organization: Not on file    Attends meetings of clubs or organizations: Not on file    Relationship status: Not on file  . Intimate partner violence    Fear of current or ex partner: Not on file    Emotionally abused: Not on file    Physically abused: Not on file    Forced sexual activity: Not on file  Other Topics Concern  . Not on file  Social History Narrative   Drinks less than one cup of caffeine daily.    Review of Systems  Constitution: Negative for decreased appetite, malaise/fatigue, weight gain and weight loss.  Eyes: Negative for visual disturbance.  Cardiovascular: Positive for claudication and dyspnea on exertion. Negative for chest pain, leg swelling, orthopnea, palpitations and syncope.  Respiratory: Negative for hemoptysis and wheezing.   Endocrine: Negative for cold intolerance and heat intolerance.  Hematologic/Lymphatic: Does not bruise/bleed easily.  Skin: Negative for nail changes.  Musculoskeletal: Negative for muscle weakness and myalgias.  Gastrointestinal: Negative for abdominal pain, change in bowel habit, nausea and vomiting.  Neurological: Negative for difficulty with concentration, dizziness, focal weakness and headaches.  Psychiatric/Behavioral: Negative for altered mental status and suicidal ideas.  All other systems reviewed and are negative.     Objective:  Blood pressure 116/62, pulse 67, temperature (!) 94.3 F (34.6 C), height 5\' 1"  (1.549 m), weight 139 lb 14.4 oz (63.5 kg), SpO2 96 %. Body mass index is 26.43 kg/m.     Physical Exam  Constitutional: She is oriented to person, place, and time. Vital signs are normal. She appears well-developed and well-nourished.  HENT:  Head: Normocephalic and atraumatic.  Neck: Normal range of motion.  Cardiovascular: Normal rate, regular rhythm, normal heart sounds and intact distal pulses.  Pulmonary/Chest: Effort normal and breath sounds normal. No accessory  muscle usage. No respiratory distress.  Abdominal: Soft. Bowel sounds are normal.  Musculoskeletal: Normal range of motion.  Neurological: She is alert and oriented to person, place, and time.  Skin: Skin is warm and dry.  Vitals reviewed.  Radiology: No results found.  Laboratory examination:    CMP Latest Ref Rng & Units 03/22/2019 11/15/2018 03/01/2018  Glucose 65 - 99 mg/dL 89 03/03/2018) 92  BUN 8 - 27 mg/dL 22 25 433(I)  Creatinine 0.57 - 1.00 mg/dL 95(J 8.84 1.66  Sodium 134 - 144 mmol/L 140 139 139  Potassium 3.5 - 5.2 mmol/L 4.5 4.6 4.6  Chloride 96 - 106 mmol/L 101 104 104  CO2 20 - 29 mmol/L 23 20 28   Calcium 8.7 - 10.3 mg/dL 9.9 9.4 0.63  Total Protein 6.0 - 8.5  g/dL 6.5 - 6.8  Total Bilirubin 0.0 - 1.2 mg/dL 0.3 - 0.5  Alkaline Phos 39 - 117 IU/L 61 - -  AST 0 - 40 IU/L 20 - 14  ALT 0 - 32 IU/L 17 - 13   CBC Latest Ref Rng & Units 11/15/2018 03/01/2018 05/28/2017  WBC 3.4 - 10.8 x10E3/uL 5.2 4.2 11.1(H)  Hemoglobin 11.1 - 15.9 g/dL 16.1 09.6 04.5  Hematocrit 34.0 - 46.6 % 36.5 38.4 40.5  Platelets 150 - 450 x10E3/uL 247 244 244   Lipid Panel     Component Value Date/Time   CHOL 201 (H) 03/22/2019 1001   TRIG 213 (H) 03/22/2019 1001   HDL 68 03/22/2019 1001   CHOLHDL 3.0 03/22/2019 1001   LDLCALC 90 03/22/2019 1001   HEMOGLOBIN A1C No results found for: HGBA1C, MPG TSH No results for input(s): TSH in the last 8760 hours.  PRN Meds:. Medications Discontinued During This Encounter  Medication Reason  . methocarbamol (ROBAXIN) 500 MG tablet Error   Current Meds  Medication Sig  . aspirin EC 81 MG tablet Take 1 tablet (81 mg total) by mouth daily.  Marland Kitchen bismuth subsalicylate (PEPTO BISMOL) 262 MG/15ML suspension Take 30 mLs by mouth every 6 (six) hours as needed for indigestion.  . celecoxib (CELEBREX) 200 MG capsule TAKE 1 CAPSULE(200 MG) BY MOUTH DAILY AS NEEDED  . Cholecalciferol (VITAMIN D) 50 MCG (2000 UT) CAPS Take 400 Units by mouth daily.   . Coenzyme Q10  (COQ-10) 200 MG CAPS Take 200 mg by mouth daily.  . diclofenac sodium (VOLTAREN) 1 % GEL Apply 1 application topically 4 (four) times daily as needed (pain).  . DULoxetine (CYMBALTA) 60 MG capsule Take 120 mg by mouth daily.  Marland Kitchen ketoconazole (NIZORAL) 2 % cream Apply 1 application topically daily as needed for irritation.  Marland Kitchen LORazepam (ATIVAN) 0.5 MG tablet Take 1 tablet (0.5 mg total) by mouth 2 (two) times daily. (Patient taking differently: Take 0.5 mg by mouth See admin instructions. Take 0.5 mg in the morning, may take a second 0.5 mg dose as needed for anxiety)  . Menthol-Zinc Oxide (GOLD BOND EX) Apply 1 application topically daily as needed (pain/itching).  . metoprolol succinate (TOPROL-XL) 25 MG 24 hr tablet TAKE 1 TABLET BY MOUTH EVERY NIGHT AT BEDTIME  . nitroGLYCERIN (NITROSTAT) 0.4 MG SL tablet Place 0.4 mg under the tongue every 5 (five) minutes as needed for chest pain.  Marland Kitchen omeprazole (PRILOSEC) 40 MG capsule Take 40 mg by mouth daily.  . QUEtiapine (SEROQUEL XR) 50 MG TB24 24 hr tablet Take 50 mg by mouth.  . [DISCONTINUED] methocarbamol (ROBAXIN) 500 MG tablet Take 500 mg by mouth at bedtime as needed for muscle spasms.    Cardiac Studies:   Carotid artery duplex  04/25/2019: Stenosis in the right internal carotid artery (50-69%). Stenosis right external carotid artery >50%.  Stenosis in the left internal carotid artery (50-69%). Antegrade right vertebral artery flow. Antegrade left vertebral artery flow. Compared to the study done on 10/29/2018, there is progression of carotid stenosis on the right ICA.  No change left ICA. Follow up in six months is appropriate if clinically indicated.  Coronary angiogram 11/23/2018: Left dominant circulation, focal high-grade 99% stenosis in the very large OM1, otherwise tortuous LAD and circumflex coronary artery, no significant disease. S/P stenting of OM1 with 2.5 x 15 mm Orsiro DES at 11 atmospheric pressure giving a 2.75 mm lumen.  Stenosis reduced from 99% to 0% with maintenance of TIMI-3  to TIMI-3 flow.  Echocardiogram 10/29/2018: Left ventricle cavity is normal in size. Mild concentric hypertrophy of the left ventricle. Normal global wall motion. Normal diastolic filling pattern. Calculated EF 58%. Mild to moderate mitral regurgitation. Mild tricuspid regurgitation. No evidence of pulmonary hypertension.  Exercise sestamibi stress test 10/25/2018: 1. The patient performed treadmill exercise using Bruce protocol, completing 5:03 minutes. The patient completed an estimated workload of 7 METS, reaching 86% of the maximum predicted heart rate. Exercise capacity was low. Hemodynamic response was normal. Stress symptoms included exercise induced chest pain, relieved with rest.  2. The overall quality of the study is good. There is no evidence of abnormal lung activity. Stress and rest SPECT images demonstrate homogeneous tracer distribution throughout the myocardium. TID was mildly increased at 1.1. Gated SPECT imaging reveals normal myocardial thickening and wall motion. The left ventricular ejection fraction was normal (66%).  3. Intermediate risk study given symptoms and EKG changes. No SPECT evidence of ischemia or infarction see. Clinical correlation recommended.  Assessment:     ICD-10-CM   1. Atherosclerosis of native coronary artery of native heart without angina pectoris  I25.10 EKG 12-Lead  2. Asymptomatic carotid artery stenosis, bilateral  I65.23   3. Mixed hyperlipidemia  E78.2      EKG 06/29/2019: Normal sinus rhythm at 61 bpm, normal axis, inferior and lateral ST depression, cannot exclude ischemia. Abnormal EKG. No changes compared to EKG 09/09/2018.   Recommendations:   Patient is here on a 39-month office visit and follow-up for CAD.  She is doing well without any symptoms of angina.  No clinical evidence of heart failure.  She continues to be on dual antiplatelet therapy.  I have encouraged her to  remain active and to continue with regular walking to prevent any deconditioning.  Her blood pressure is well controlled.  Previously her lipids were elevated, and she was changed to Crestor.  She does admit to being off of her Crestor for a few months, but has recently resumed this.  I will repeat her lipids along with CMP in 6 weeks to follow-up on this.  I have discussed recently obtain carotid duplex results from August that did show slight progression of right ICA stenosis to 50 to 69%.  Remains asymptomatic regards to this.  She will need continued 20-month surveillance of this.  I will plan to see her back in approximately 4 months after her carotid duplex for follow-up.  Miquel Dunn, MSN, APRN, FNP-C [facility]. [place] Office: [phone] Fax: [phone]

## 2019-09-29 ENCOUNTER — Ambulatory Visit: Payer: Medicare PPO | Attending: Internal Medicine

## 2019-09-29 DIAGNOSIS — Z23 Encounter for immunization: Secondary | ICD-10-CM | POA: Insufficient documentation

## 2019-09-29 NOTE — Progress Notes (Signed)
   Covid-19 Vaccination Clinic  Name:  Diane Richardson    MRN: [id number] DOB: [birth year]  09/29/2019  [name] was observed post Covid-19 immunization for 15 minutes without incidence. She was provided with Vaccine Information Sheet and instruction to access the V-Safe system.   [name] was instructed to call 911 with any severe reactions post vaccine: Marland Kitchen Difficulty breathing  . Swelling of your face and throat  . A fast heartbeat  . A bad rash all over your body  . Dizziness and weakness    Immunizations Administered    Name Date Dose VIS Date Route   Pfizer COVID-19 Vaccine 09/29/2019  4:43 PM 0.3 mL 08/19/2019 Intramuscular   Manufacturer: [employer], [employer]   Lot: [id number]   NDC: [address]-9

## 2019-10-05 ENCOUNTER — Ambulatory Visit (INDEPENDENT_AMBULATORY_CARE_PROVIDER_SITE_OTHER): Payer: Medicare PPO

## 2019-10-05 ENCOUNTER — Other Ambulatory Visit: Payer: Self-pay

## 2019-10-05 DIAGNOSIS — I6523 Occlusion and stenosis of bilateral carotid arteries: Secondary | ICD-10-CM | POA: Diagnosis not present

## 2019-10-06 LAB — COMPREHENSIVE METABOLIC PANEL
ALT: 59 IU/L — ABNORMAL HIGH (ref 0–32)
AST: 42 IU/L — ABNORMAL HIGH (ref 0–40)
Albumin/Globulin Ratio: 2.5 — ABNORMAL HIGH (ref 1.2–2.2)
Albumin: 4.9 g/dL — ABNORMAL HIGH (ref 3.8–4.8)
Alkaline Phosphatase: 70 IU/L (ref 39–117)
BUN/Creatinine Ratio: 20 (ref 12–28)
BUN: 17 mg/dL (ref 8–27)
Bilirubin Total: 0.3 mg/dL (ref 0.0–1.2)
CO2: 24 mmol/L (ref 20–29)
Calcium: 9.9 mg/dL (ref 8.7–10.3)
Chloride: 103 mmol/L (ref 96–106)
Creatinine, Ser: 0.87 mg/dL (ref 0.57–1.00)
GFR calc Af Amer: 80 mL/min/{1.73_m2} (ref >59)
GFR calc non Af Amer: 69 mL/min/{1.73_m2} (ref >59)
Globulin, Total: 2 g/dL (ref 1.5–4.5)
Glucose: 88 mg/dL (ref 65–99)
Potassium: 5 mmol/L (ref 3.5–5.2)
Sodium: 141 mmol/L (ref 134–144)
Total Protein: 6.9 g/dL (ref 6.0–8.5)

## 2019-10-06 LAB — LIPID PANEL
Chol/HDL Ratio: 2 ratio (ref 0.0–4.4)
Cholesterol, Total: 178 mg/dL (ref 100–199)
HDL: 88 mg/dL (ref >39)
LDL Chol Calc (NIH): 71 mg/dL (ref 0–99)
Triglycerides: 111 mg/dL (ref 0–149)
VLDL Cholesterol Cal: 19 mg/dL (ref 5–40)

## 2019-10-09 ENCOUNTER — Other Ambulatory Visit: Payer: Self-pay | Admitting: Cardiology

## 2019-10-09 DIAGNOSIS — I6523 Occlusion and stenosis of bilateral carotid arteries: Secondary | ICD-10-CM

## 2019-10-10 ENCOUNTER — Other Ambulatory Visit: Payer: Medicare Other

## 2019-10-11 ENCOUNTER — Other Ambulatory Visit: Payer: Self-pay | Admitting: Cardiology

## 2019-10-11 NOTE — Progress Notes (Signed)
Called pt to inform her about her US results. Pt understood

## 2019-10-20 ENCOUNTER — Ambulatory Visit: Payer: Medicare PPO | Attending: Internal Medicine

## 2019-10-20 DIAGNOSIS — Z23 Encounter for immunization: Secondary | ICD-10-CM | POA: Insufficient documentation

## 2019-10-20 NOTE — Progress Notes (Signed)
   Covid-19 Vaccination Clinic  Name:  Diane Richardson    MRN: [id number] DOB: 05-02-1951  10/20/2019  [name] was observed post Covid-19 immunization for 15 minutes without incidence. She was provided with Vaccine Information Sheet and instruction to access the V-Safe system.   [name] was instructed to call 911 with any severe reactions post vaccine: Marland Kitchen Difficulty breathing  . Swelling of your face and throat  . A fast heartbeat  . A bad rash all over your body  . Dizziness and weakness    Immunizations Administered    Name Date Dose VIS Date Route   Pfizer COVID-19 Vaccine 10/20/2019 10:49 AM 0.3 mL 08/19/2019 Intramuscular   Manufacturer: [employer], [employer]   Lot: [id number]   NDC: [address]-4

## 2019-11-01 ENCOUNTER — Encounter: Payer: Self-pay | Admitting: Cardiology

## 2019-11-01 ENCOUNTER — Ambulatory Visit: Payer: Medicare PPO | Admitting: Cardiology

## 2019-11-01 ENCOUNTER — Other Ambulatory Visit: Payer: Self-pay

## 2019-11-01 VITALS — BP 131/69 | HR 66 | Temp 97.3°F | Ht 61.0 in | Wt 141.5 lb

## 2019-11-01 DIAGNOSIS — I6523 Occlusion and stenosis of bilateral carotid arteries: Secondary | ICD-10-CM

## 2019-11-01 DIAGNOSIS — E782 Mixed hyperlipidemia: Secondary | ICD-10-CM

## 2019-11-01 DIAGNOSIS — I25118 Atherosclerotic heart disease of native coronary artery with other forms of angina pectoris: Secondary | ICD-10-CM

## 2019-11-01 NOTE — Progress Notes (Signed)
Primary Physician:  Merri Brunette, MD   Patient ID: Diane Richardson, female    DOB: [birth year], 69 y.o.   MRN: [id number]  Subjective:    Chief Complaint  Patient presents with  . Coronary Artery Disease    chest pain    HPI: Diane Richardson  is a 69 y.o. female  with history of fibromyalgia, depression, bipolar disorder, hyperlipidemia, and CAD s/p cath 11/23/2018 with stenting to OM1 and bilateral carotid stenosis.  Patient is here on a 39-month office visit and follow-up for CAD and to discuss recent lipids and carotid duplex results.  She is doing overall well, hoping to continue to improve on her strength and fatigue.  With regular exercise.  She has had some chest pain on occasion that is resolved with nitroglycerin.  Reports having to take occasional nitroglycerin a few times a month.  Feels that this is overall been stable.  She is tolerating her medications well. Hypertension has been well controlled.  She has been compliant with her Crestor for the last few months.  Past Medical History:  Diagnosis Date  . ADD (attention deficit disorder)   . Anxiety   . Coronary artery disease   . Depression   . DVT (deep venous thrombosis) (HCC)   . Fatigue    chronic  . Fibromyalgia   . Hyperlipidemia   . Muscle spasm    bad  . PE (pulmonary embolism)   . PTSD (post-traumatic stress disorder)     Past Surgical History:  Procedure Laterality Date  . BUNIONECTOMY    . CARDIAC CATHETERIZATION    . childbirth  1989   x1,NVD  . CORONARY STENT INTERVENTION N/A 11/23/2018   Procedure: CORONARY STENT INTERVENTION;  Surgeon: Yates Decamp, MD;  Location: [facility];  Service: Cardiovascular;  Laterality: N/A;  OM  . FOOT SURGERY Right    pt states she had bunion surgery right foot.  Marland Kitchen HAND SURGERY    . LEFT HEART CATH AND CORONARY ANGIOGRAPHY N/A 11/23/2018   Procedure: LEFT HEART CATH AND CORONARY ANGIOGRAPHY;  Surgeon: Yates Decamp, MD;  Location: [facility];  Service:  Cardiovascular;  Laterality: N/A;  . PULMONARY EMBOLISM SURGERY  1983   x9 days    Social History   Socioeconomic History  . Marital status: Married    Spouse name: Not on file  . Number of children: 1  . Years of education: PHD  . Highest education level: Not on file  Occupational History  . Occupation: disabled    Comment: FORMER TEACHER  Tobacco Use  . Smoking status: Never Smoker  . Smokeless tobacco: Never Used  Substance and Sexual Activity  . Alcohol use: Yes    Comment: 3 glasses of wine weekly  . Drug use: Never  . Sexual activity: Yes  Other Topics Concern  . Not on file  Social History Narrative   Drinks less than one cup of caffeine daily.   Social Determinants of Health   Financial Resource Strain: Low Risk   . Difficulty of Paying Living Expenses: Not hard at all  Food Insecurity: No Food Insecurity  . Worried About Programme researcher, broadcasting/film/video in the Last Year: Never true  . Ran Out of Food in the Last Year: Never true  Transportation Needs: No Transportation Needs  . Lack of Transportation (Medical): No  . Lack of Transportation (Non-Medical): No  Physical Activity: Insufficiently Active  . Days of Exercise per Week: 4 days  .  Minutes of Exercise per Session: 30 min  Stress: Stress Concern Present  . Feeling of Stress : To some extent  Social Connections:   . Frequency of Communication with Friends and Family: Not on file  . Frequency of Social Gatherings with Friends and Family: Not on file  . Attends Religious Services: Not on file  . Active Member of Clubs or Organizations: Not on file  . Attends Archivist Meetings: Not on file  . Marital Status: Not on file  Intimate Partner Violence:   . Fear of Current or Ex-Partner: Not on file  . Emotionally Abused: Not on file  . Physically Abused: Not on file  . Sexually Abused: Not on file    Review of Systems  Constitution: Negative for decreased appetite, malaise/fatigue, weight gain and weight  loss.  Eyes: Negative for visual disturbance.  Cardiovascular: Positive for claudication and dyspnea on exertion. Negative for chest pain, leg swelling, orthopnea, palpitations and syncope.  Respiratory: Negative for hemoptysis and wheezing.   Endocrine: Negative for cold intolerance and heat intolerance.  Hematologic/Lymphatic: Does not bruise/bleed easily.  Skin: Negative for nail changes.  Musculoskeletal: Negative for muscle weakness and myalgias.  Gastrointestinal: Negative for abdominal pain, change in bowel habit, nausea and vomiting.  Neurological: Negative for difficulty with concentration, dizziness, focal weakness and headaches.  Psychiatric/Behavioral: Negative for altered mental status and suicidal ideas.  All other systems reviewed and are negative.     Objective:  Blood pressure 131/69, pulse 66, temperature (!) 97.3 F (36.3 C), height 5\' 1"  (1.549 m), weight 141 lb 8 oz (64.2 kg), SpO2 98 %. Body mass index is 26.74 kg/m.     Physical Exam  Constitutional: She is oriented to person, place, and time. Vital signs are normal. She appears well-developed and well-nourished.  HENT:  Head: Normocephalic and atraumatic.  Cardiovascular: Normal rate, regular rhythm, normal heart sounds and intact distal pulses.  Pulmonary/Chest: Effort normal and breath sounds normal. No accessory muscle usage. No respiratory distress.  Abdominal: Soft. Bowel sounds are normal.  Musculoskeletal:        General: Normal range of motion.     Cervical back: Normal range of motion.  Neurological: She is alert and oriented to person, place, and time.  Skin: Skin is warm and dry.  Vitals reviewed.  Radiology: No results found.  Laboratory examination:    CMP Latest Ref Rng & Units 10/05/2019 03/22/2019 11/15/2018  Glucose 65 - 99 mg/dL 88 89 100(H)  BUN 8 - 27 mg/dL 17 22 25   Creatinine 0.57 - 1.00 mg/dL 0.87 0.89 0.73  Sodium 134 - 144 mmol/L 141 140 139  Potassium 3.5 - 5.2 mmol/L 5.0 4.5  4.6  Chloride 96 - 106 mmol/L 103 101 104  CO2 20 - 29 mmol/L 24 23 20   Calcium 8.7 - 10.3 mg/dL 9.9 9.9 9.4  Total Protein 6.0 - 8.5 g/dL 6.9 6.5 -  Total Bilirubin 0.0 - 1.2 mg/dL 0.3 0.3 -  Alkaline Phos 39 - 117 IU/L 70 61 -  AST 0 - 40 IU/L 42(H) 20 -  ALT 0 - 32 IU/L 59(H) 17 -   CBC Latest Ref Rng & Units 11/15/2018 03/01/2018 05/28/2017  WBC 3.4 - 10.8 x10E3/uL 5.2 4.2 11.1(H)  Hemoglobin 11.1 - 15.9 g/dL 12.7 13.0 13.0  Hematocrit 34.0 - 46.6 % 36.5 38.4 40.5  Platelets 150 - 450 x10E3/uL 247 244 244   Lipid Panel     Component Value Date/Time   CHOL 178  23-Oct-2019 1106   TRIG 111 10/23/2019 1106   HDL 88 Oct 23, 2019 1106   CHOLHDL 2.0 October 23, 2019 1106   LDLCALC 71 10/23/19 1106   HEMOGLOBIN A1C No results found for: HGBA1C, MPG TSH No results for input(s): TSH in the last 8760 hours.  PRN Meds:. Medications Discontinued During This Encounter  Medication Reason  . QUEtiapine (SEROQUEL XR) 50 MG TB24 24 hr tablet Error   Current Meds  Medication Sig  . aspirin EC 81 MG tablet Take 1 tablet (81 mg total) by mouth daily.  . celecoxib (CELEBREX) 200 MG capsule TAKE 1 CAPSULE(200 MG) BY MOUTH DAILY AS NEEDED  . Cholecalciferol (VITAMIN D) 50 MCG (2000 UT) CAPS Take 400 Units by mouth daily.   . clopidogrel (PLAVIX) 75 MG tablet Take 75 mg by mouth daily.  . Coenzyme Q10 (COQ-10) 200 MG CAPS Take 200 mg by mouth daily.  . diclofenac sodium (VOLTAREN) 1 % GEL Apply 1 application topically 4 (four) times daily as needed (pain).  . DULoxetine (CYMBALTA) 60 MG capsule Take 120 mg by mouth daily.  Marland Kitchen ketoconazole (NIZORAL) 2 % cream Apply 1 application topically daily as needed for irritation.  Marland Kitchen LORazepam (ATIVAN) 0.5 MG tablet Take 1 tablet (0.5 mg total) by mouth 2 (two) times daily. (Patient taking differently: Take 0.5 mg by mouth See admin instructions. Take 0.5 mg in the morning, may take a second 0.5 mg dose as needed for anxiety)  . Menthol-Zinc Oxide (GOLD BOND EX)  Apply 1 application topically daily as needed (pain/itching).  . metoprolol succinate (TOPROL-XL) 25 MG 24 hr tablet TAKE 1 TABLET BY MOUTH EVERY NIGHT AT BEDTIME  . nitroGLYCERIN (NITROSTAT) 0.4 MG SL tablet DISSOLVE 1 TABLET UNDER THE TONGUE EVERY 5 MINUTES AS NEEDED FOR CHEST PAIN  . omeprazole (PRILOSEC) 40 MG capsule Take 40 mg by mouth daily.  . rosuvastatin (CRESTOR) 20 MG tablet TAKE 1 TABLET(20 MG) BY MOUTH DAILY  . VRAYLAR capsule Take 1.5 mg by mouth daily.    Cardiac Studies:   Carotid artery duplex Oct 23, 2019:  Stenosis in the right internal carotid artery (16-49%). Mild stenosis in  the right external carotid artery (<50%).  Stenosis in the left internal carotid artery (50-69%). Mild stenosis in  the left external carotid artery (<50%).  Antegrade right vertebral artery flow. Antegrade left vertebral artery  flow.  Compared to 04/25/19, right ICA stenosis was >50%. Follow up in six months  is appropriate if clinically indicated.  Coronary angiogram 11/23/2018: Left dominant circulation, focal high-grade 99% stenosis in the very large OM1, otherwise tortuous LAD and circumflex coronary artery, no significant disease. S/P stenting of OM1 with 2.5 x 15 mm Orsiro DES at 11 atmospheric pressure giving a 2.75 mm lumen. Stenosis reduced from 99% to 0% with maintenance of TIMI-3 to TIMI-3 flow.  Echocardiogram 10/29/2018: Left ventricle cavity is normal in size. Mild concentric hypertrophy of the left ventricle. Normal global wall motion. Normal diastolic filling pattern. Calculated EF 58%. Mild to moderate mitral regurgitation. Mild tricuspid regurgitation. No evidence of pulmonary hypertension.  Exercise sestamibi stress test 10/25/2018: 1. The patient performed treadmill exercise using Bruce protocol, completing 5:03 minutes. The patient completed an estimated workload of 7 METS, reaching 86% of the maximum predicted heart rate. Exercise capacity was low. Hemodynamic  response was normal. Stress symptoms included exercise induced chest pain, relieved with rest.  2. The overall quality of the study is good. There is no evidence of abnormal lung activity. Stress and rest SPECT images  demonstrate homogeneous tracer distribution throughout the myocardium. TID was mildly increased at 1.1. Gated SPECT imaging reveals normal myocardial thickening and wall motion. The left ventricular ejection fraction was normal (66%).  3. Intermediate risk study given symptoms and EKG changes. No SPECT evidence of ischemia or infarction see. Clinical correlation recommended.  Assessment:     ICD-10-CM   1. Asymptomatic carotid artery stenosis, bilateral  I65.23   2. Atherosclerosis of native coronary artery of native heart with stable angina pectoris (HCC)  I25.118 EKG 12-Lead  3. Mixed hyperlipidemia  E78.2 Lipid Profile    Comprehensive Metabolic Panel (CMET)    EKG 11/01/2019: Normal sinus rhythm at 65 bpm, normal axis, previously noted inferior and lateral ST depression no longer present. Nonspecific T wave abnormality. Abnormal EKG. No significant changes compared to EKG 06/29/2019  Recommendations:   Diane Richardson  is a 69 y.o. female  with history of fibromyalgia, depression, bipolar disorder, hyperlipidemia, and CAD s/p cath 11/23/2018 with stenting to OM1 and bilateral carotid stenosis.  Since last seen by [place] 3 months ago, she is overall doing well.  She does continue to have some fatigue that is likely multifactorial from fibromyalgia and depression.  She feels that this will improved with regular exercise.  She has been trying to do some exercise, but due to the weather has been inconsistent with this.  Encouraged her to consider potentially starting to exercise with personal trainer to regularly help her with this.  She has had occasional episodes chest pain times a month that has been stable and easily relieved with nitroglycerin.  I have advised her to continue  to monitor this, if she notices any increased frequency of having take nitroglycerin to notify me.  I discussed potentially starting Imdur, she wishes to monitor and will notify me if she wants to start this.  I recommended controlled.  Reviewed her recent lipids, she has had significant improvement in her LDL with resuming Crestor as she had previously not been taking this.  Hopefully with continuing with her Crestor and working on diet and exercise changes, this will continue to improve.  Will reevaluate in 3 months for follow-up.  We will continue with dual antiplatelet therapy for now.  No bleeding reported.  I have discussed her recent carotid duplex results, she has had some improvement on the right internal and external carotid artery.  We will continue to need surveillance for bilateral carotid stenosis in 6 months.  I will plan to see her back in 3 months for follow-up on angina and HLD, but encouraged her to contact me sooner if needed.  Toniann Fail, MSN, APRN, FNP-C [facility] [place] Office: [phone] Fax: [phone]

## 2019-11-10 ENCOUNTER — Other Ambulatory Visit: Payer: Self-pay | Admitting: Cardiology

## 2019-11-25 ENCOUNTER — Other Ambulatory Visit: Payer: Self-pay

## 2019-11-25 MED ORDER — CLOPIDOGREL BISULFATE 75 MG PO TABS: 75.0000 mg | ORAL_TABLET | Freq: Every day | ORAL | 1 refills | Status: DC

## 2019-12-05 ENCOUNTER — Encounter (HOSPITAL_COMMUNITY): Payer: Self-pay | Admitting: *Deleted

## 2019-12-05 ENCOUNTER — Emergency Department (HOSPITAL_COMMUNITY): Payer: Medicare PPO

## 2019-12-05 ENCOUNTER — Emergency Department (HOSPITAL_COMMUNITY)
Admission: EM | Admit: 2019-12-05 | Discharge: 2019-12-06 | Disposition: A | Discharge status: Home / Self Care | Payer: Medicare PPO | Attending: Emergency Medicine | Admitting: Emergency Medicine

## 2019-12-05 DIAGNOSIS — M797 Fibromyalgia: Secondary | ICD-10-CM | POA: Insufficient documentation

## 2019-12-05 DIAGNOSIS — Z79899 Other long term (current) drug therapy: Secondary | ICD-10-CM | POA: Diagnosis not present

## 2019-12-05 DIAGNOSIS — Z86718 Personal history of other venous thrombosis and embolism: Secondary | ICD-10-CM | POA: Insufficient documentation

## 2019-12-05 DIAGNOSIS — S6991XA Unspecified injury of right wrist, hand and finger(s), initial encounter: Secondary | ICD-10-CM | POA: Diagnosis not present

## 2019-12-05 DIAGNOSIS — S59901A Unspecified injury of right elbow, initial encounter: Secondary | ICD-10-CM | POA: Insufficient documentation

## 2019-12-05 DIAGNOSIS — R110 Nausea: Secondary | ICD-10-CM | POA: Insufficient documentation

## 2019-12-05 DIAGNOSIS — M25521 Pain in right elbow: Secondary | ICD-10-CM | POA: Diagnosis not present

## 2019-12-05 DIAGNOSIS — M25551 Pain in right hip: Secondary | ICD-10-CM | POA: Diagnosis not present

## 2019-12-05 DIAGNOSIS — Y9389 Activity, other specified: Secondary | ICD-10-CM | POA: Insufficient documentation

## 2019-12-05 DIAGNOSIS — I2510 Atherosclerotic heart disease of native coronary artery without angina pectoris: Secondary | ICD-10-CM | POA: Insufficient documentation

## 2019-12-05 DIAGNOSIS — Z7982 Long term (current) use of aspirin: Secondary | ICD-10-CM | POA: Insufficient documentation

## 2019-12-05 DIAGNOSIS — Y929 Unspecified place or not applicable: Secondary | ICD-10-CM | POA: Diagnosis not present

## 2019-12-05 DIAGNOSIS — S79911A Unspecified injury of right hip, initial encounter: Secondary | ICD-10-CM | POA: Insufficient documentation

## 2019-12-05 DIAGNOSIS — Y999 Unspecified external cause status: Secondary | ICD-10-CM | POA: Insufficient documentation

## 2019-12-05 DIAGNOSIS — I6782 Cerebral ischemia: Secondary | ICD-10-CM | POA: Diagnosis not present

## 2019-12-05 DIAGNOSIS — S0990XA Unspecified injury of head, initial encounter: Secondary | ICD-10-CM | POA: Insufficient documentation

## 2019-12-05 DIAGNOSIS — M25511 Pain in right shoulder: Secondary | ICD-10-CM | POA: Insufficient documentation

## 2019-12-05 DIAGNOSIS — S60211A Contusion of right wrist, initial encounter: Secondary | ICD-10-CM | POA: Diagnosis not present

## 2019-12-05 DIAGNOSIS — S60221A Contusion of right hand, initial encounter: Secondary | ICD-10-CM | POA: Diagnosis not present

## 2019-12-05 DIAGNOSIS — S4991XA Unspecified injury of right shoulder and upper arm, initial encounter: Secondary | ICD-10-CM | POA: Diagnosis present

## 2019-12-05 MED ORDER — ONDANSETRON 4 MG PO TBDP
4.0000 mg | ORAL_TABLET | Freq: Once | ORAL | Status: AC
  Administered 2019-12-05: 19:00:00 4 mg via ORAL
  Filled 2019-12-05: qty 1

## 2019-12-05 NOTE — ED Provider Notes (Signed)
[facility] EMERGENCY DEPARTMENT Provider Note   CSN: [id number] Arrival date & time: 12/05/19  1610     History Chief Complaint  Patient presents with  . Fall    Diane Richardson is a 69 y.o. 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  The patient's was riding a horse this morning when the saddle loosened and she fell off of the horse and landed onto her right side.  She reports that she also hit her head, but was wearing a helmet. She denies being kicked by the horse.  No syncope, nausea, or vomiting.  She reports that she was unable to stand after the fall without assistance.  Reports that she has been unable to ambulate independently, which is her baseline, since the fall due to significant pain with bearing weight on her right leg.  She is endorsing pain in her right buttock and hip.  She became nauseated earlier in the ER when she attempted to stand and put weight on her right leg. She also reports that she is having pain in her right elbow and range of motion has been limited since the injury.  She denies numbness, weakness, visual changes, left upper lower extremity pain, chest pain, shortness of breath, abdominal pain, back pain, or neck pain.  She received Zofran in the ER for nausea, which has since resolved.  No other treatment prior to arrival.  Reports that she also has some old bruising to her right hand from where her horse attempted to nip her last week.   The history is provided by the patient. No language interpreter was used.       Past Medical History:  Diagnosis Date  . ADD (attention deficit disorder)   . Anxiety   . Coronary artery disease   . Depression   . DVT (deep venous thrombosis) ([place])   . Fatigue    chronic  . Fibromyalgia   . Hyperlipidemia   . Muscle spasm    bad  . PE (pulmonary  embolism)   . PTSD (post-traumatic stress disorder)     Patient Active Problem List   Diagnosis Date Noted  . Atherosclerosis of native coronary artery of native heart without angina pectoris 12/07/2018  . Bilateral carotid artery stenosis 11/16/2018  . Dyslipidemia 11/16/2018  . Angina pectoris ([place]) 11/14/2018  . Abnormal nuclear stress test 11/14/2018  . Mixed hyperlipidemia 11/14/2018  . Bilateral carotid bruits 11/12/2018  . Fibromyalgia 02/27/2017  . Primary insomnia 02/27/2017  . Chronic midline low back pain with left-sided sciatica 02/27/2017  . Long term current use of non-steroidal anti-inflammatories (NSAID) 02/27/2017  . History of psychotic reaction/ history of OCD / history of depression  02/27/2017  . Other psychotic disorder not due to substance or known physiological condition ([place]) 01/17/2015  . Rig[place] 01/17/2015  . 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 Progressive dementia with uncertain etiology ([place]) 01/17/2015  . OCD (obsessive compulsive disorder) 10/16/2014  . Delusional disorder ([place]) 10/16/2014  . Mild neurocognitive disorder 10/10/2014  . Mild benzodiazepine use disorder ([place]) 10/10/2014  . Anxiety disorder 10/10/2014  . Anxiety 08/29/2014  . H/O deep venous thrombosis 08/29/2014  . Healed or old pulmonary embolism 08/29/2014  . Paranoia [facility])     Past Surgical History:  Procedure Laterality Date  . BUNIONECTOMY    . CARDIAC CATHETERIZATION    . childbirth  1989   x1,NVD  . CORONARY STENT INTERVENTION N/A 11/23/2018   Procedure: CORONARY STENT INTERVENTION;  Surgeon: Adrian Prows, MD;  Location: [facility] CV LAB;  Service: Cardiovascular;  Laterality: N/A;  OM  . FOOT SURGERY Right    pt states she had bunion surgery right foot.  Marland Kitchen HAND SURGERY    . LEFT HEART CATH AND CORONARY ANGIOGRAPHY N/A 11/23/2018   Procedure: LEFT HEART CATH AND CORONARY ANGIOGRAPHY;  Surgeon: Adrian Prows, MD;  Location: [facility];  Service: Cardiovascular;   Laterality: N/A;  . PULMONARY EMBOLISM SURGERY  1983   x9 days     OB History   No obstetric history on file.     Family History  Problem Relation Age of Onset  . Cancer Mother        ovarian   . Peripheral vascular disease Mother 64  . Hyperlipidemia Father   . Hypertension Father   . Multiple myeloma Father   . CAD Father        CABG age 73  . Heart disease Father        Pacer age 9  . Diabetes Maternal Grandmother   . Cancer Maternal Grandmother        stomach  . Arthritis Brother   . Dementia Maternal Grandfather   . Colon polyps Maternal Grandfather   . CAD Paternal Grandmother   . Diabetes Paternal Grandmother   . Dementia Paternal Grandfather     Social History   Tobacco Use  . Smoking status: Never Smoker  . Smokeless tobacco: Never Used  Substance Use Topics  . Alcohol use: Yes    Comment: 3 glasses of wine weekly  . Drug use: Never    Home Medications Prior to Admission medications   Medication Sig Start Date End Date Taking? Authorizing Provider  aspirin EC 81 MG tablet Take 1 tablet (81 mg total) by mouth daily. 10/12/14   Kerrie Buffalo, NP  bismuth subsalicylate (PEPTO BISMOL) 262 MG/15ML suspension Take 30 mLs by mouth every 6 (six) hours as needed for indigestion.    [provider]  celecoxib (CELEBREX) 200 MG capsule TAKE 1 CAPSULE(200 MG) BY MOUTH DAILY AS NEEDED 08/26/18   Bo Merino, MD  Cholecalciferol (VITAMIN D) 50 MCG (2000 UT) CAPS Take 400 Units by mouth daily.     [provider]  clopidogrel (PLAVIX) 75 MG tablet Take 1 tablet (75 mg total) by mouth daily. 11/25/19   Miquel Dunn, NP  Coenzyme Q10 (COQ-10) 200 MG CAPS Take 200 mg by mouth daily.    [provider]  diclofenac sodium (VOLTAREN) 1 % GEL Apply 1 application topically 4 (four) times daily as needed (pain).    [provider]  DULoxetine (CYMBALTA) 60 MG capsule Take 120 mg by mouth daily.    [provider]    ketoconazole (NIZORAL) 2 % cream Apply 1 application topically daily as needed for irritation.    [provider]  LORazepam (ATIVAN) 0.5 MG tablet Take 1 tablet (0.5 mg total) by mouth 2 (two) times daily. Patient taking differently: Take 0.5 mg by mouth See admin instructions. Take 0.5 mg in the morning, may take a second 0.5 mg dose as needed for anxiety 10/12/14   Kerrie Buffalo, NP  Menthol-Zinc Oxide (GOLD BOND EX) Apply 1 application topically daily as needed (pain/itching).    [provider]  metoprolol succinate (TOPROL-XL) 25 MG 24 hr tablet TAKE 1 TABLET BY MOUTH EVERY NIGHT  AT BEDTIME 11/10/19   Adrian Prows, MD  nitroGLYCERIN (NITROSTAT) 0.4 MG SL tablet DISSOLVE 1 TABLET UNDER THE TONGUE EVERY 5 MINUTES AS NEEDED FOR CHEST PAIN 10/11/19   Miquel Dunn, NP  omeprazole (PRILOSEC) 40 MG capsule Take 40 mg by mouth daily.    [provider]  rosuvastatin (CRESTOR) 20 MG tablet TAKE 1 TABLET(20 MG) BY MOUTH DAILY 06/29/19   Miquel Dunn, NP  traMADol (ULTRAM) 50 MG tablet Take 1 tablet (50 mg total) by mouth every 6 (six) hours as needed. 12/06/19   Jesly Hartmann A, PA-C  VRAYLAR capsule Take 1.5 mg by mouth daily. 10/12/19   [provider]    Allergies    Penicillins, Codeine, Donnatal [belladonna alk-phenobarb er], and Percocet [oxycodone-acetaminophen]  Review of Systems   Review of Systems  Constitutional: Negative for activity change, chills and fever.  HENT: Negative for congestion and sore throat.   Eyes: Negative for visual disturbance.  Respiratory: Negative for cough, shortness of breath and wheezing.   Cardiovascular: Negative for chest pain and palpitations.  Gastrointestinal: Positive for nausea (resolved). Negative for abdominal pain, blood in stool, constipation, diarrhea and vomiting.  Genitourinary: Negative for dysuria, frequency and urgency.  Musculoskeletal: Positive for arthralgias and myalgias. Negative for back  pain, gait problem, joint swelling, neck pain and neck stiffness.  Skin: Negative for rash and wound.  Allergic/Immunologic: Negative for immunocompromised state.  Neurological: Negative for dizziness, seizures, syncope, weakness, numbness and headaches.  Hematological: Bruises/bleeds easily.  Psychiatric/Behavioral: Negative for agitation and confusion.   Physical Exam Updated Vital Signs BP (!) 130/57   Pulse 80   Temp 98.5 F (36.9 C) (Oral)   Resp 16   Wt 62.1 kg   LMP  (LMP Unknown)   SpO2 100%   BMI 25.89 kg/m   Physical Exam Vitals and nursing note reviewed.  Constitutional:      General: She is not in acute distress.    Comments: Well-appearing.  No acute distress.  HENT:     Head: Normocephalic.     Nose: Nose normal.  Eyes:     Extraocular Movements: Extraocular movements intact.     Conjunctiva/sclera: Conjunctivae normal.     Pupils: Pupils are equal, round, and reactive to light.  Neck:     Comments: Full active range of motion of the neck Cardiovascular:     Rate and Rhythm: Normal rate and regular rhythm.     Heart sounds: No murmur. No friction rub. No gallop.   Pulmonary:     Effort: Pulmonary effort is normal. No respiratory distress.     Breath sounds: No stridor. No wheezing, rhonchi or rales.     Comments: Lungs are clear to auscultation bilaterally.  No tenderness to the sternum movements.  No overlying bruising noted to the chest wall. Chest:     Chest wall: No tenderness.  Abdominal:     General: There is no distension.     Palpations: Abdomen is soft. There is no mass.     Tenderness: There is no abdominal tenderness. There is no right CVA tenderness, left CVA tenderness, guarding or rebound.     Hernia: No hernia is present.     Comments: Abdomen is soft, nontender, nondistended.  No ecchymosis.  Musculoskeletal:     Cervical back: Neck supple.     Right lower leg: No edema.     Left lower leg: No edema.     Comments: No tenderness to the  cervical,  thoracic, or lumbar spinous processes or bilateral paraspinal muscles.  Tender to palpation to the right shoulder.  There is a area of ecchymosis noted to the right posterior shoulder.  Decreased range of motion of the right shoulder secondary to pain.  No focal tenderness to the right elbow.  Full active and passive range of motion.  Patient grabs her right shoulder and pain with pronation of the right upper extremity.   Full active and passive range of motion of the right wrist and all digits of the right hand. There is also ecchymosis noted on the dorsum of the right wrist and diffusely to the dorsum of the right hand.  She is diffusely tender to the dorsum of the right hand.  No crepitus or step-offs.  Digits of the right hand are nontender.    Normal exam of the left lower extremity.  No tenderness to the right lower leg.  Right knee is nontender.  She has some tenderness over the lateral and posterior aspect of the right hip diffusely.  No shortening of the right leg.  No pain with internal or external rotation.  Full active range of motion of the right hip without pain.  She is able to stand on the bilateral lower extremities and bear weight on the right leg without difficulty.  No ecchymosis overlying the lumbar back, right buttock, right hip.  She is neurovascularly intact to the bilateral upper and lower extremities.  Skin:    General: Skin is warm.     Findings: No rash.     Comments:    Neurological:     Mental Status: She is alert and oriented to person, place, and time.     Comments: 5-5 strength against resistance of the bilateral upper and lower extremities.  Sensation is intact and equal throughout.  Cranial nerves II through XII are grossly intact throughout.  Psychiatric:        Behavior: Behavior normal.     ED Results / Procedures / Treatments   Labs (all labs ordered are listed, but only abnormal results are displayed) Labs Reviewed - No data to  display  EKG None  Radiology DG Shoulder Right  Result Date: 12/06/2019 CLINICAL DATA:  Fall from horse EXAM: RIGHT SHOULDER - 2+ VIEW COMPARISON:  None. FINDINGS: Chronic changes of the distal right clavicle, likely related to prior trauma. No acute fracture or dislocation at the right shoulder. IMPRESSION: No acute fracture or dislocation of the right shoulder. Electronically Signed   By: Ulyses Jarred M.D.   On: 12/06/2019 00:06   DG Elbow Complete Right  Result Date: 12/05/2019 CLINICAL DATA:  Pain EXAM: RIGHT ELBOW - COMPLETE 3+ VIEW COMPARISON:  None. FINDINGS: There is no evidence of fracture, dislocation, or joint effusion. There is no evidence of arthropathy or other focal bone abnormality. Soft tissues are unremarkable. IMPRESSION: Negative. Electronically Signed   By: Constance Holster M.D.   On: 12/05/2019 17:53   DG Wrist Complete Right  Result Date: 12/06/2019 CLINICAL DATA:  Fall from horse EXAM: RIGHT WRIST - COMPLETE 3+ VIEW COMPARISON:  None. FINDINGS: There is no evidence of fracture or dislocation. There is no evidence of arthropathy or other focal bone abnormality. Soft tissues are unremarkable. IMPRESSION: Negative. Electronically Signed   By: Ulyses Jarred M.D.   On: 12/06/2019 00:07   CT Head Wo Contrast  Result Date: 12/05/2019 CLINICAL DATA:  Fall from horse with headaches, initial encounter EXAM: CT HEAD WITHOUT CONTRAST TECHNIQUE: Contiguous axial images  were obtained from the base of the skull through the vertex without intravenous contrast. COMPARISON:  05/20/2019 FINDINGS: Brain: Mild atrophic changes are noted. No findings to suggest acute hemorrhage, acute infarction or space-occupying mass lesion are seen. Mild chronic white matter ischemic changes again noted. Vascular: No hyperdense vessel or unexpected calcification. Skull: Normal. Negative for fracture or focal lesion. Sinuses/Orbits: No acute finding. Other: None. IMPRESSION: Chronic atrophic and ischemic  changes without acute abnormality. Electronically Signed   By: Inez Catalina M.D.   On: 12/05/2019 19:45   DG Hand Complete Right  Result Date: 12/06/2019 CLINICAL DATA:  Fall from horse EXAM: RIGHT HAND - COMPLETE 3+ VIEW COMPARISON:  None. FINDINGS: There is no evidence of fracture or dislocation. There is no evidence of arthropathy or other focal bone abnormality. Soft tissues are unremarkable. IMPRESSION: Negative. Electronically Signed   By: Ulyses Jarred M.D.   On: 12/06/2019 00:07   DG Hip Unilat With Pelvis 2-3 Views Right  Result Date: 12/05/2019 CLINICAL DATA:  Right hip pain status post fall from horse. EXAM: DG HIP (WITH OR WITHOUT PELVIS) 2-3V RIGHT COMPARISON:  None. FINDINGS: There are moderate degenerative changes of both hips. There is no acute displaced fracture. No dislocation. IMPRESSION: No acute osseous abnormality. Moderate degenerative changes of both hips. Electronically Signed   By: Constance Holster M.D.   On: 12/05/2019 17:52    Procedures Procedures (including critical care time)  Medications Ordered in ED Medications  ondansetron (ZOFRAN-ODT) disintegrating tablet 4 mg (4 mg Oral Given 12/05/19 1855)  traMADol (ULTRAM) tablet 50 mg (50 mg Oral Given 12/06/19 0041)    ED Course  I have reviewed the triage vital signs and the nursing notes.  Pertinent labs & imaging results that were available during my care of the patient were reviewed by me and considered in my medical decision making (see chart for details).    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  She is endorsing pain to the right upper and lower extremities and has been unable to bear weight on her right leg since the fall. The patient was seen and independently evaluated by Dr. Dayna Barker, attending  physician.   Patient was wearing a helmet during the fall.  CT head and cervical spine are unremarkable.  She does have ecchymosis noted to the right shoulder, right wrist, right hand.    Images on my evaluation are negative for fracture.  She was also endorsing right hip pain.  X-ray of the pelvis and right hip are negative.  She has no shortening or rotation of the right lower extremity.  Able to bear weight on the right lower extremity at bedside on my exam.  Given her exam, low suspicion for occult fracture.  Will order tramadol for pain control and ensure that the patient's ambulatory in the ER.    Patient reports that pain has improved after tramadol.  She was able to ambulate independently in the hallway without difficulty.  At this time, she is hemodynamically stable and in no acute distress.  ER return precautions given and ER discharge instructions discussed.  All questions answered at bedside.  She is safe for discharge with outpatient follow-up as needed.   Final Clinical  Impression(s) / ED Diagnoses Final diagnoses:  Fall from horse, initial encounter  Acute pain of right shoulder  Right elbow pain  Right hip pain  Contusion of right wrist, initial encounter  Contusion of right hand, initial encounter    Rx / DC Orders ED Discharge Orders         Ordered    traMADol (ULTRAM) 50 MG tablet  Every 6 hours PRN     12/06/19 0124           Joanne Gavel, PA-C 12/06/19 0127    Mesner, Corene Cornea, MD 12/06/19 [phone]

## 2019-12-05 NOTE — ED Notes (Signed)
Pt was not feeling well, nausea.  Went to bathroom.  Re-evaluate in triage. VSS

## 2019-12-05 NOTE — ED Triage Notes (Signed)
Pt fell off her horse and hit her right side, she states that she struck her right shoulder, right arm, right hand (swelling and bruising noted to right hand which is pre existing) and also her right hip and right side of her head (pt was wearing a helment), no LOC.

## 2019-12-05 NOTE — ED Provider Notes (Signed)
MSE was initiated and I personally evaluated the patient and placed orders (if any) at  5:19 PM on December 05, 2019.  69 year old female presenting to the ED today after fall off of a horse approximately 4 hours ago. Pt was wearing her helmet however hit the right side of her head. No LOC. She is currently on Plavix s/p stent. She also complains of pain to her right elbow and right hip. Has not taken anything for pain PTA however unable to bare much weight onto her hip.   Physical Exam  Constitutional: She is well-developed, well-nourished, and in no distress.  HENT:  Head: Normocephalic and atraumatic.  Eyes: Pupils are equal, round, and reactive to light. Conjunctivae are normal.  Cardiovascular: Normal rate, regular rhythm, normal heart sounds and intact distal pulses.  Pulmonary/Chest: Breath sounds normal. No respiratory distress. She has no wheezes. She has no rales.  Abdominal: Soft. Bowel sounds are normal. There is no abdominal tenderness. There is no rebound and no guarding.  Musculoskeletal:     Cervical back: Normal range of motion.     Comments: No C, T, or L midline spinal tenderness to palpation. No tenderness to paraspinal muscular TTP. ROM intact to neck and back.   + old healing bruise to right hand from bite from horse 1 week ago. + Tenderness to right elbow. No tenderness to right shoulder, humerus, forearm, wrist, or hand. ROM intact to RUE. 2+ radial pulse.    + Tenderness to right hip. ROM intact to hip. Strength 5/5 to RLE. No tenderness to distal leg. 2+ DP pulse.   Neurological:  CN 3-12 grossly intact A&O x4 GCS 15 Sensation and strength intact Coordination with finger-to-nose WNL Neg romberg, neg pronator drift  Nursing note and vitals reviewed.  69 year old female who presents to the ED today after falling off horse. Anticoagulated. + head injury however was wearing helmet. No LOC. + pain to right elbow and right hip. Will obtain CT head, and xrays. While in  triage pt began complaining of nausea - zofran given.   The patient appears stable so that the remainder of the MSE may be completed by another provider.   Tanda Rockers, PA-C 12/05/19 1724    Milagros Loll, MD 12/06/19 [phone]

## 2019-12-06 MED ORDER — TRAMADOL HCL 50 MG PO TABS
50.0000 mg | ORAL_TABLET | Freq: Once | ORAL | Status: AC
  Administered 2019-12-06: 50 mg via ORAL
  Filled 2019-12-06: qty 1

## 2019-12-06 MED ORDER — TRAMADOL HCL 50 MG PO TABS: 50.0000 mg | ORAL_TABLET | Freq: Four times a day (QID) | ORAL | 0 refills | Status: DC | PRN

## 2019-12-06 NOTE — ED Provider Notes (Signed)
Medical screening examination/treatment/procedure(s) were conducted as a shared visit with non-physician practitioner(s) and myself.  I personally evaluated the patient during the encounter.  Fall off horse approximately 4 feet. Hip pain and shoulder pain. Improved significantly now.  xrays ok. Apparently has been able to bear weight.   Plan for continued pain control and if able to ambulate, dc w/ close follow up.         Dominic Mahaney, Barbara Cower, MD 12/06/19 [phone]

## 2019-12-06 NOTE — Discharge Instructions (Addendum)
Thank you for allowing me to care for you today in the Emergency Department.   You were seen in the emergency department today after falling from a horse.  Your work-up today was reassuring and did not show any broken bones or any abnormalities to the brain or skull.  You were given a dose of tramadol in the ER.  Continue to take your home medications as prescribed, including Plavix.   Take 650 mg of Tylenol or 600 mg of ibuprofen with food every 6 hours for pain.  You can alternate between these 2 medications every 3 hours if your pain returns.  For instance, you can take Tylenol at noon, followed by a dose of ibuprofen at 3, followed by second dose of Tylenol and 6.  Do not take Celebrex if you are taking ibuprofen or other NSAIDs at the same time.  For severe, uncontrollable pain, you can take 1-2 tablets of Tramadol every 6 hours.  If your pain is well controlled with Tylenol or ibuprofen, then you do not need to take this medication.  This medication can make you drowsy or impaired.  You should not take it with other medications or substances that may also make you drowsy, including lorazepam, alcohol.   Apply ice packs for 15 to 20 minutes to any areas that may be sore up to 3-4 times per day for the next 5 days.   Follow-up with primary care in 1 week if your symptoms do not significantly improve.  Return to the emergency department if you have another fall or injury, if you develop new numbness, weakness, slurred speech, visual changes, or other new, concerning symptoms.

## 2019-12-15 ENCOUNTER — Ambulatory Visit (INDEPENDENT_AMBULATORY_CARE_PROVIDER_SITE_OTHER): Payer: Medicare PPO | Admitting: Podiatry

## 2019-12-15 ENCOUNTER — Other Ambulatory Visit: Payer: Self-pay

## 2019-12-15 ENCOUNTER — Ambulatory Visit (INDEPENDENT_AMBULATORY_CARE_PROVIDER_SITE_OTHER): Payer: Medicare PPO

## 2019-12-15 DIAGNOSIS — M2042 Other hammer toe(s) (acquired), left foot: Secondary | ICD-10-CM | POA: Diagnosis not present

## 2019-12-15 DIAGNOSIS — M2012 Hallux valgus (acquired), left foot: Secondary | ICD-10-CM

## 2019-12-15 DIAGNOSIS — M2011 Hallux valgus (acquired), right foot: Secondary | ICD-10-CM | POA: Diagnosis not present

## 2019-12-15 DIAGNOSIS — Q828 Other specified congenital malformations of skin: Secondary | ICD-10-CM

## 2019-12-15 DIAGNOSIS — M2041 Other hammer toe(s) (acquired), right foot: Secondary | ICD-10-CM

## 2019-12-15 NOTE — Progress Notes (Signed)
Subjective:  Patient ID: Diane Richardson, female    DOB: Feb 05, 1951,  MRN: [id number] HPI No chief complaint on file.   69 y.o. female presents with the above complaint.   ROS: Denies fever chills nausea vomiting muscle aches pains calf pain back pain chest pain shortness of breath.  Past Medical History:  Diagnosis Date  . ADD (attention deficit disorder)   . Anxiety   . Coronary artery disease   . Depression   . DVT (deep venous thrombosis) ([place])   . Fatigue    chronic  . Fibromyalgia   . Hyperlipidemia   . Muscle spasm    bad  . PE (pulmonary embolism)   . PTSD (post-traumatic stress disorder)    Past Surgical History:  Procedure Laterality Date  . BUNIONECTOMY    . CARDIAC CATHETERIZATION    . childbirth  1989   x1,NVD  . CORONARY STENT INTERVENTION N/A 11/23/2018   Procedure: CORONARY STENT INTERVENTION;  Surgeon: Adrian Prows, MD;  Location: [facility];  Service: Cardiovascular;  Laterality: N/A;  OM  . FOOT SURGERY Right    pt states she had bunion surgery right foot.  Marland Kitchen HAND SURGERY    . LEFT HEART CATH AND CORONARY ANGIOGRAPHY N/A 11/23/2018   Procedure: LEFT HEART CATH AND CORONARY ANGIOGRAPHY;  Surgeon: Adrian Prows, MD;  Location: [facility];  Service: Cardiovascular;  Laterality: N/A;  . PULMONARY EMBOLISM SURGERY  1983   x9 days    Current Outpatient Medications:  .  aspirin EC 81 MG tablet, Take 1 tablet (81 mg total) by mouth daily., Disp: , Rfl:  .  bismuth subsalicylate (PEPTO BISMOL) 262 MG/15ML suspension, Take 30 mLs by mouth every 6 (six) hours as needed for indigestion., Disp: , Rfl:  .  celecoxib (CELEBREX) 200 MG capsule, TAKE 1 CAPSULE(200 MG) BY MOUTH DAILY AS NEEDED, Disp: 30 capsule, Rfl: 0 .  Cholecalciferol (VITAMIN D) 50 MCG (2000 UT) CAPS, Take 400 Units by mouth daily. , Disp: , Rfl:  .  clopidogrel (PLAVIX) 75 MG tablet, Take 1 tablet (75 mg total) by mouth daily., Disp: 90 tablet, Rfl: 1 .  Coenzyme Q10 (COQ-10) 200 MG  CAPS, Take 200 mg by mouth daily., Disp: , Rfl:  .  diclofenac sodium (VOLTAREN) 1 % GEL, Apply 1 application topically 4 (four) times daily as needed (pain)., Disp: , Rfl:  .  DULoxetine (CYMBALTA) 60 MG capsule, Take 120 mg by mouth daily., Disp: , Rfl:  .  ketoconazole (NIZORAL) 2 % cream, Apply 1 application topically daily as needed for irritation., Disp: , Rfl:  .  LORazepam (ATIVAN) 0.5 MG tablet, Take 1 tablet (0.5 mg total) by mouth 2 (two) times daily. (Patient taking differently: Take 0.5 mg by mouth See admin instructions. Take 0.5 mg in the morning, may take a second 0.5 mg dose as needed for anxiety), Disp: 30 tablet, Rfl: 0 .  Menthol-Zinc Oxide (GOLD BOND EX), Apply 1 application topically daily as needed (pain/itching)., Disp: , Rfl:  .  metoprolol succinate (TOPROL-XL) 25 MG 24 hr tablet, TAKE 1 TABLET BY MOUTH EVERY NIGHT AT BEDTIME, Disp: 90 tablet, Rfl: 0 .  nitroGLYCERIN (NITROSTAT) 0.4 MG SL tablet, DISSOLVE 1 TABLET UNDER THE TONGUE EVERY 5 MINUTES AS NEEDED FOR CHEST PAIN, Disp: 25 tablet, Rfl: 3 .  omeprazole (PRILOSEC) 40 MG capsule, Take 40 mg by mouth daily., Disp: , Rfl:  .  rosuvastatin (CRESTOR) 20 MG tablet, TAKE 1 TABLET(20 MG) BY MOUTH  DAILY, Disp: 30 tablet, Rfl: 3 .  traMADol (ULTRAM) 50 MG tablet, Take 1 tablet (50 mg total) by mouth every 6 (six) hours as needed., Disp: 8 tablet, Rfl: 0 .  VRAYLAR capsule, Take 1.5 mg by mouth daily., Disp: , Rfl:   Allergies  Allergen Reactions  . Penicillins Hives and Diarrhea    Did it involve swelling of the face/tongue/throat, SOB, or low BP? No Did it involve sudden or severe rash/hives, skin peeling, or any reaction on the inside of your mouth or nose? Yes Did you need to seek medical attention at a hospital or doctor's office? Yes When did it last happen?childhood allergy If all above answers are "NO", may proceed with cephalosporin use.   . Codeine Hives and Diarrhea  . Donnatal [Belladonna Alk-Phenobarb  Er] Rash  . Percocet [Oxycodone-Acetaminophen] Other (See Comments)    delusional   Review of Systems Objective:  There were no vitals filed for this visit.  General: Well developed, nourished, in no acute distress, alert and oriented x3   Dermatological: Skin is warm, dry and supple bilateral. Nails x 10 are well maintained; remaining integument appears unremarkable at this time. There are no open sores, no preulcerative lesions, no rash or signs of infection present.  Vascular: Dorsalis Pedis artery and Posterior Tibial artery pedal pulses are 2/4 bilateral with immedate capillary fill time. Pedal hair growth present. No varicosities and no lower extremity edema present bilateral.   Neruologic: Grossly intact via light touch bilateral. Vibratory intact via tuning fork bilateral. Protective threshold with Semmes Wienstein monofilament intact to all pedal sites bilateral. Patellar and Achilles deep tendon reflexes 2+ bilateral. No Babinski or clonus noted bilateral.   Musculoskeletal: No gross boney pedal deformities bilateral. No pain, crepitus, or limitation noted with foot and ankle range of motion bilateral. Muscular strength 5/5 in all groups tested bilateral.  Moderate to severe nonreducible first metatarsophalangeal joint dislocation hallux valgus deformity with hammertoe deformity second left.  This is not fully reducible either.  Radiographs confirm an elongated second metatarsal  Gait: Unassisted, Nonantalgic.    Radiographs:  Radiographs taken today demonstrate an increase in the first intermetatarsal angle greater than normal values of the left foot osseously mature individual hammertoe deformity second left.  Elongated second metatarsal left foot.  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  At this point she also has a history of coronary artery disease and a history of pulmonary emboli.  Plan:  We discussed surgical and nonsurgical options explained to her that she would be a high risk surgery for this type of correction she understands that and is amenable to it.  She is following up with her cardiologist in the near future and I have recommended that she discuss this with him.  If he gives [place] to go ahead for this then we will proceed otherwise conservative therapies may be her best option.     Rebecca Motta T. Wiseman, [place]

## 2020-01-07 HISTORY — PX: CATARACT EXTRACTION: SUR2

## 2020-01-09 ENCOUNTER — Encounter: Payer: Self-pay | Admitting: Cardiology

## 2020-01-10 DIAGNOSIS — F330 Major depressive disorder, recurrent, mild: Secondary | ICD-10-CM | POA: Diagnosis not present

## 2020-01-12 DIAGNOSIS — H2511 Age-related nuclear cataract, right eye: Secondary | ICD-10-CM | POA: Diagnosis not present

## 2020-01-12 DIAGNOSIS — H25811 Combined forms of age-related cataract, right eye: Secondary | ICD-10-CM | POA: Diagnosis not present

## 2020-01-12 DIAGNOSIS — H25812 Combined forms of age-related cataract, left eye: Secondary | ICD-10-CM | POA: Diagnosis not present

## 2020-01-15 ENCOUNTER — Other Ambulatory Visit: Payer: Self-pay | Admitting: Cardiology

## 2020-01-27 DIAGNOSIS — E782 Mixed hyperlipidemia: Secondary | ICD-10-CM | POA: Diagnosis not present

## 2020-01-28 LAB — COMPREHENSIVE METABOLIC PANEL
ALT: 24 IU/L (ref 0–32)
AST: 23 IU/L (ref 0–40)
Albumin/Globulin Ratio: 2.9 — ABNORMAL HIGH (ref 1.2–2.2)
Albumin: 4.7 g/dL (ref 3.8–4.8)
Alkaline Phosphatase: 72 IU/L (ref 48–121)
BUN/Creatinine Ratio: 22 (ref 12–28)
BUN: 21 mg/dL (ref 8–27)
Bilirubin Total: <0.2 mg/dL (ref 0.0–1.2)
CO2: 25 mmol/L (ref 20–29)
Calcium: 9.5 mg/dL (ref 8.7–10.3)
Chloride: 105 mmol/L (ref 96–106)
Creatinine, Ser: 0.96 mg/dL (ref 0.57–1.00)
GFR calc Af Amer: 70 mL/min/{1.73_m2} (ref >59)
GFR calc non Af Amer: 61 mL/min/{1.73_m2} (ref >59)
Globulin, Total: 1.6 g/dL (ref 1.5–4.5)
Glucose: 98 mg/dL (ref 65–99)
Potassium: 4.9 mmol/L (ref 3.5–5.2)
Sodium: 142 mmol/L (ref 134–144)
Total Protein: 6.3 g/dL (ref 6.0–8.5)

## 2020-01-28 LAB — LIPID PANEL
Chol/HDL Ratio: 2.1 ratio (ref 0.0–4.4)
Cholesterol, Total: 167 mg/dL (ref 100–199)
HDL: 79 mg/dL (ref >39)
LDL Chol Calc (NIH): 70 mg/dL (ref 0–99)
Triglycerides: 98 mg/dL (ref 0–149)
VLDL Cholesterol Cal: 18 mg/dL (ref 5–40)

## 2020-01-30 ENCOUNTER — Other Ambulatory Visit: Payer: Self-pay

## 2020-01-30 ENCOUNTER — Encounter: Payer: Self-pay | Admitting: Cardiology

## 2020-01-30 ENCOUNTER — Ambulatory Visit: Payer: Medicare PPO | Admitting: Cardiology

## 2020-01-30 VITALS — BP 114/59 | HR 67 | Temp 98.2°F | Resp 14 | Ht 61.0 in | Wt 140.0 lb

## 2020-01-30 DIAGNOSIS — E782 Mixed hyperlipidemia: Secondary | ICD-10-CM

## 2020-01-30 DIAGNOSIS — L988 Other specified disorders of the skin and subcutaneous tissue: Secondary | ICD-10-CM | POA: Diagnosis not present

## 2020-01-30 DIAGNOSIS — I6523 Occlusion and stenosis of bilateral carotid arteries: Secondary | ICD-10-CM

## 2020-01-30 DIAGNOSIS — L821 Other seborrheic keratosis: Secondary | ICD-10-CM | POA: Diagnosis not present

## 2020-01-30 DIAGNOSIS — D485 Neoplasm of uncertain behavior of skin: Secondary | ICD-10-CM | POA: Diagnosis not present

## 2020-01-30 DIAGNOSIS — I25118 Atherosclerotic heart disease of native coronary artery with other forms of angina pectoris: Secondary | ICD-10-CM

## 2020-01-30 DIAGNOSIS — L820 Inflamed seborrheic keratosis: Secondary | ICD-10-CM | POA: Diagnosis not present

## 2020-01-30 NOTE — Progress Notes (Signed)
Primary Physician/Referring:  Carol Ada, MD  Patient ID: Diane Richardson, female    DOB: 12-Nov-1950, 69 y.o.   MRN: [id number]  Chief Complaint  Patient presents with  . Follow-up    3 month  . Coronary Artery Disease   HPI:    Diane Richardson  is a 69 y.o. female  with history of fibromyalgia, depression, bipolar disorder, hyperlipidemia, hyperlipidemia, and CAD s/p cath 11/23/2018 with stenting to OM1 and bilateral carotid stenosis.  She has not had any recurrence of angina pectoris however admits to weight gain since COVID-19.  She is tolerating her medications well. Hypertension has been well controlled.  She has been compliant with her Crestor for the last few months.  Past Medical History:  Diagnosis Date  . ADD (attention deficit disorder)   . Anxiety   . Coronary artery disease   . Depression   . DVT (deep venous thrombosis) ([place])   . Fatigue    chronic  . Fibromyalgia   . Hyperlipidemia   . Muscle spasm    bad  . PE (pulmonary embolism)   . PTSD (post-traumatic stress disorder)    Past Surgical History:  Procedure Laterality Date  . BUNIONECTOMY    . CARDIAC CATHETERIZATION    . CATARACT EXTRACTION  01/2020   Right eye  . childbirth  1989   x1,NVD  . CORONARY STENT INTERVENTION N/A 11/23/2018   Procedure: CORONARY STENT INTERVENTION;  Surgeon: Adrian Prows, MD;  Location: [facility];  Service: Cardiovascular;  Laterality: N/A;  OM  . FOOT SURGERY Right    pt states she had bunion surgery right foot.  Marland Kitchen HAND SURGERY    . LEFT HEART CATH AND CORONARY ANGIOGRAPHY N/A 11/23/2018   Procedure: LEFT HEART CATH AND CORONARY ANGIOGRAPHY;  Surgeon: Adrian Prows, MD;  Location: [facility];  Service: Cardiovascular;  Laterality: N/A;  . PULMONARY EMBOLISM SURGERY  1983   x9 days   Family History  Problem Relation Age of Onset  . Cancer Mother        ovarian   . Peripheral vascular disease Mother 90  . Hyperlipidemia Father   . Hypertension Father    . Multiple myeloma Father   . CAD Father        CABG age 13  . Heart disease Father        Pacer age 50  . Diabetes Maternal Grandmother   . Cancer Maternal Grandmother        stomach  . Arthritis Brother   . Dementia Maternal Grandfather   . Colon polyps Maternal Grandfather   . CAD Paternal Grandmother   . Diabetes Paternal Grandmother   . Dementia Paternal Grandfather     Social History   Tobacco Use  . Smoking status: Never Smoker  . Smokeless tobacco: Never Used  Substance Use Topics  . Alcohol use: Yes    Comment: 7 glasses of wine weekly   Marital Status: Married  ROS  Review of Systems  Cardiovascular: Positive for claudication and dyspnea on exertion. Negative for leg swelling and syncope.  Respiratory: Negative for shortness of breath.   Gastrointestinal: Negative for melena.   Objective  Blood pressure (!) 114/59, pulse 67, temperature 98.2 F (36.8 C), temperature source Oral, resp. rate 14, height _0  (1.549 m), weight 140 lb (63.5 kg), SpO2 97 %.  Vitals with BMI 01/30/2020 12/05/2019 12/05/2019  Height _1  - -  Weight 140 lbs - -  BMI  54.27 - -  Systolic 062 376 283  Diastolic 59 57 67  Pulse 67 80 78  Some encounter information is confidential and restricted. Go to Review Flowsheets activity to see all data.     Physical Exam  Constitutional: She appears well-developed and well-nourished. No distress.  Cardiovascular: Normal rate, regular rhythm and intact distal pulses. Exam reveals no gallop.  No murmur heard. No leg edema, no JVD.   Pulmonary/Chest: Effort normal and breath sounds normal. No accessory muscle usage.  Abdominal: Soft. Bowel sounds are normal.   Laboratory examination:   Recent Labs    03/22/19 1001 10/05/19 1106 01/27/20 1616  NA 140 141 142  K 4.5 5.0 4.9  CL 101 103 105  CO2 _0 GLUCOSE 89 88 98  BUN _1 CREATININE 0.89 0.87 0.96  CALCIUM 9.9 9.9 9.5  GFRNONAA 67 69 61  GFRAA 78 80 70   estimated  creatinine clearance is 47.9 mL/min (by C-G formula based on SCr of 0.96 mg/dL).  CMP Latest Ref Rng & Units 01/27/2020 10/05/2019 03/22/2019  Glucose 65 - 99 mg/dL 98 88 89  BUN 8 - 27 mg/dL _2 Creatinine 0.57 - 1.00 mg/dL 0.96 0.87 0.89  Sodium 134 - 144 mmol/L 142 141 140  Potassium 3.5 - 5.2 mmol/L 4.9 5.0 4.5  Chloride 96 - 106 mmol/L 105 103 101  CO2 20 - 29 mmol/L _3 Calcium 8.7 - 10.3 mg/dL 9.5 9.9 9.9  Total Protein 6.0 - 8.5 g/dL 6.3 6.9 6.5  Total Bilirubin 0.0 - 1.2 mg/dL <0.2 0.3 0.3  Alkaline Phos 48 - 121 IU/L 72 70 61  AST 0 - 40 IU/L 23 42(H) 20  ALT 0 - 32 IU/L 24 59(H) 17   CBC Latest Ref Rng & Units 11/15/2018 03/01/2018 05/28/2017  WBC 3.4 - 10.8 x10E3/uL 5.2 4.2 11.1(H)  Hemoglobin 11.1 - 15.9 g/dL 12.7 13.0 13.0  Hematocrit 34.0 - 46.6 % 36.5 38.4 40.5  Platelets 150 - 450 x10E3/uL 247 244 244   Lipid Panel     Component Value Date/Time   CHOL 167 01/27/2020 1616   TRIG 98 01/27/2020 1616   HDL 79 01/27/2020 1616   CHOLHDL 2.1 01/27/2020 1616   LDLCALC 70 01/27/2020 1616   HEMOGLOBIN A1C No results found for: HGBA1C, MPG TSH No results for input(s): TSH in the last 8760 hours.  External labs:  None  Medications and allergies   Allergies  Allergen Reactions  . Penicillins Hives and Diarrhea    Did it involve swelling of the face/tongue/throat, SOB, or low BP? No Did it involve sudden or severe rash/hives, skin peeling, or any reaction on the inside of your mouth or nose? Yes Did you need to seek medical attention at a hospital or doctor's office? Yes When did it last happen?childhood allergy If all above answers are "NO", may proceed with cephalosporin use.   . Codeine Hives and Diarrhea  . Donnatal [Belladonna Alk-Phenobarb Er] Rash  . Percocet [Oxycodone-Acetaminophen] Other (See Comments)    delusional     Current Outpatient Medications  Medication Instructions  . aspirin EC 81 mg, Oral, Daily  . bismuth subsalicylate  (PEPTO BISMOL) 262 MG/15ML suspension 30 mLs, Oral, Every 6 hours PRN  . celecoxib (CELEBREX) 200 MG capsule TAKE 1 CAPSULE(200 MG) BY MOUTH DAILY AS NEEDED  . CoQ-10 200 mg, Oral, Daily  . diclofenac sodium (VOLTAREN) 1 % GEL 1 application, Topical, 4  times daily PRN  . DULoxetine (CYMBALTA) 120 mg, Oral, Daily  . ketoconazole (NIZORAL) 2 % cream 1 application, Topical, Daily PRN  . LORazepam (ATIVAN) 0.5 mg, Oral, 2 times daily  . Menthol-Zinc Oxide (GOLD BOND EX) 1 application, Apply externally, Daily PRN  . metoprolol succinate (TOPROL-XL) 25 MG 24 hr tablet TAKE 1 TABLET BY MOUTH EVERY NIGHT AT BEDTIME  . nitroGLYCERIN (NITROSTAT) 0.4 MG SL tablet DISSOLVE 1 TABLET UNDER THE TONGUE EVERY 5 MINUTES AS NEEDED FOR CHEST PAIN  . omeprazole (PRILOSEC) 40 mg, Oral, Daily  . rosuvastatin (CRESTOR) 20 MG tablet TAKE 1 TABLET(20 MG) BY MOUTH DAILY  . Vitamin D 400 Units, Oral, Daily  . Vraylar 1.5 mg, Oral, Daily   Radiology:   CT Head Wo Contrast 12/05/2019: Chronic atrophic and ischemic changes without acute abnormality.  Cardiac Studies:   Echocardiogram 10/29/2018: Left ventricle cavity is normal in size. Mild concentric hypertrophy of the left ventricle. Normal global wall motion. Normal diastolic filling pattern. Calculated EF 58%. Mild to moderate mitral regurgitation. Mild tricuspid regurgitation. No evidence of pulmonary hypertension.  Exercise sestamibi stress test 10/25/2018: 1. The patient performed treadmill exercise using Bruce protocol, completing 5:03 minutes. The patient completed an estimated workload of 7 METS, reaching 86% of the maximum predicted heart rate. Exercise capacity was low. Hemodynamic response was normal. Stress symptoms included exercise induced chest pain, relieved with rest.  2. The overall quality of the study is good. There is no evidence of abnormal lung activity. Stress and rest SPECT images demonstrate homogeneous tracer distribution throughout the  myocardium. TID was mildly increased at 1.1. Gated SPECT imaging reveals normal myocardial thickening and wall motion. The left ventricular ejection fraction was normal (66%).  3. Intermediate risk study given symptoms and EKG changes. No SPECT evidence of ischemia or infarction see. Clinical correlation recommended.  Coronary angiogram 11/23/2018: Left dominant circulation, focal high-grade 99% stenosis in the very large OM1, otherwise tortuous LAD and circumflex coronary artery, no significant disease. S/P stenting of OM1 with 2.5 x 15 mm Orsiro DES at 11 atmospheric pressure giving a 2.75 mm lumen. Stenosis reduced from 99% to 0% with maintenance of TIMI-3 to TIMI-3 flow.  Carotid artery duplex 10/05/2019:  Stenosis in the right internal carotid artery (16-49%). Mild stenosis in the right external carotid artery (<50%).  Stenosis in the left internal carotid artery (50-69%). Mild stenosis in the left external carotid artery (<50%).  Antegrade right vertebral artery flow. Antegrade left vertebral artery flow.  Compared to 04/25/19, right ICA stenosis was >50%. Follow up in six months is appropriate if clinically indicated.  EKG  11/01/2019: Normal sinus rhythm at 65 bpm, normal axis, previously noted inferior and lateral ST depression no longer present. Nonspecific T wave abnormality. Abnormal EKG. No significant changes compared to EKG 06/29/2019  Assessment     ICD-10-CM   1. Atherosclerosis of native coronary artery of native heart with stable angina pectoris ([place])  I25.118   2. Asymptomatic carotid artery stenosis, bilateral  I65.23   3. Mixed hyperlipidemia  E78.2      No orders of the defined types were placed in this encounter.   Medications Discontinued During This Encounter  Medication Reason  . clopidogrel (PLAVIX) 75 MG tablet Completed Course  . traMADol (ULTRAM) 50 MG tablet No longer needed (for PRN medications)    Recommendations:   Diane Richardson  is a 69 y.o.  female  with history of fibromyalgia, depression, bipolar disorder, hyperlipidemia, and CAD s/p cath  11/23/2018 with stenting to OM1 and asymptomatic bilateral carotid stenosis.   I have reviewed the results of the lipid profile testing, lipids under excellent control.  She has not had any recurrence of angina and has not used any sublingual nitroglycerin.  Blood pressures well controlled.  As it has been a year since angioplasty to her circumflex coronary artery, will discontinue Plavix.  Continue aspirin indefinitely.  She has gained weight and we discussed regarding weight loss strategies.  Regular exercise also discussed in detail.  Importance of surveillance of carotid duplex and compliance with medication discussed, I will see her back in 6 months.   Adrian Prows, MD, [facility] 01/30/2020, 8:50 PM [facility] Pager: [phone] Office: [phone]

## 2020-02-23 DIAGNOSIS — H2512 Age-related nuclear cataract, left eye: Secondary | ICD-10-CM | POA: Diagnosis not present

## 2020-02-23 DIAGNOSIS — H2513 Age-related nuclear cataract, bilateral: Secondary | ICD-10-CM | POA: Diagnosis not present

## 2020-02-23 DIAGNOSIS — H25812 Combined forms of age-related cataract, left eye: Secondary | ICD-10-CM | POA: Diagnosis not present

## 2020-03-12 IMAGING — CT CT HEAD W/O CM
2 series · 15 of 40 positions shown, 18 images · non-contrast
Comparison: Head CT 07/22/2014.

CLINICAL DATA: 68-year-old female with fall yesterday. Left head
injury. Visual disturbance.

EXAM:
CT HEAD WITHOUT CONTRAST
TECHNIQUE: Contiguous axial images were obtained from the base of the skull
through the vertex without intravenous contrast.

[Series 2: head w/(date) · axial · 0.49mm/px · z∈[-151,-11]mm · 12 of 34 slices shown, 15 images]
[im 3/34  brain]
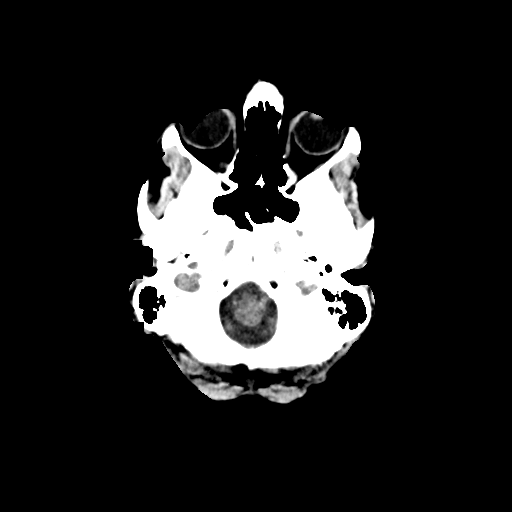
[im 3/34  bone]
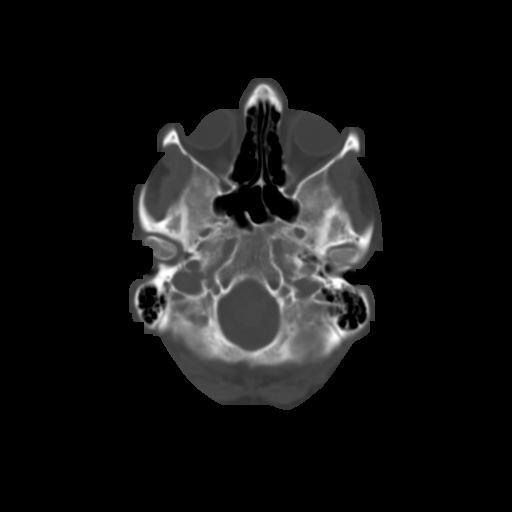
[im 5/34  brain]
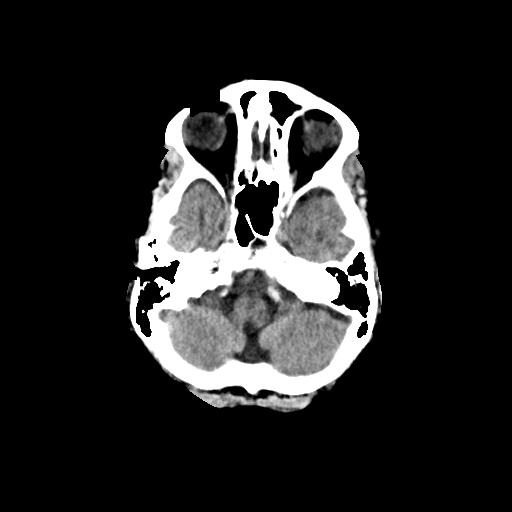
[im 7/34  brain]
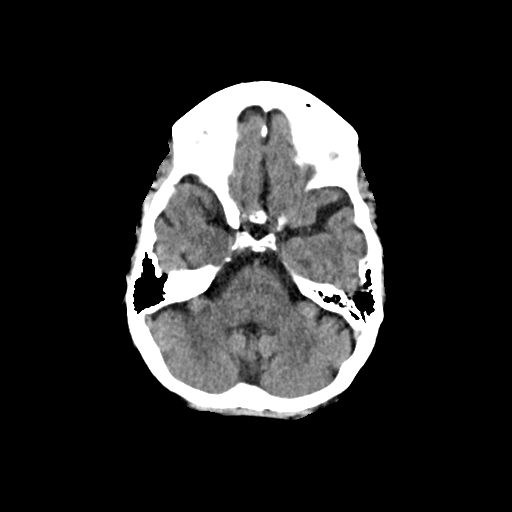
[im 11/34  brain]
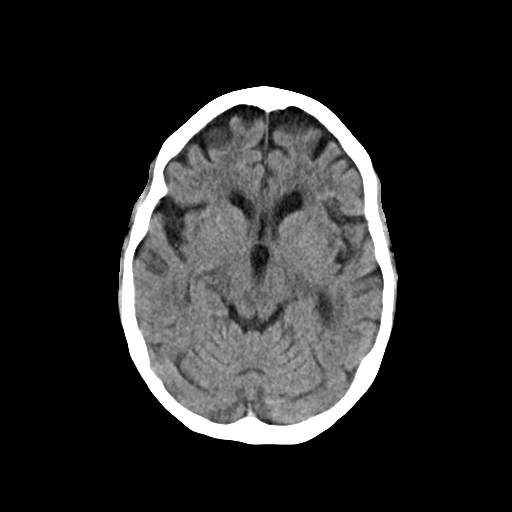
[im 13/34  brain]
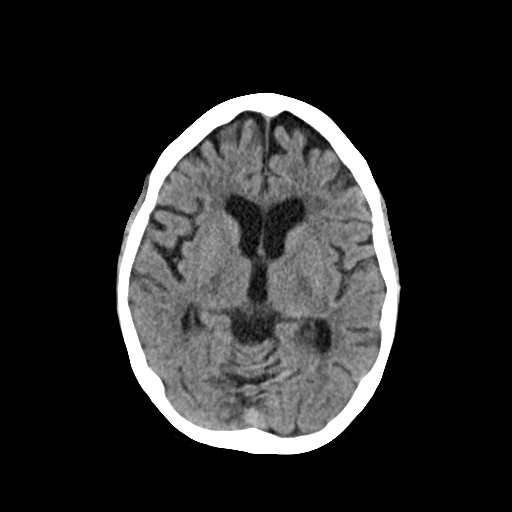
[im 13/34  bone]
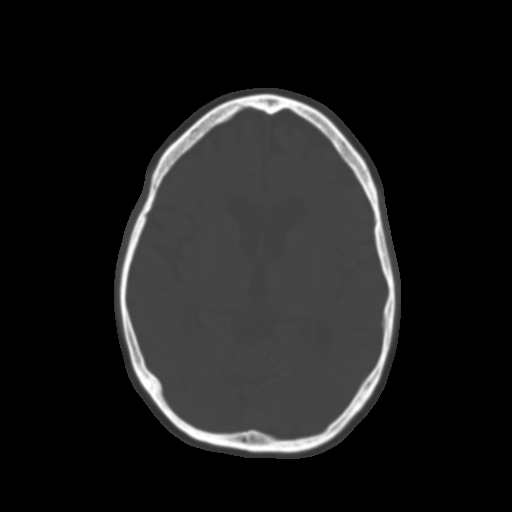
[im 15/34  brain]
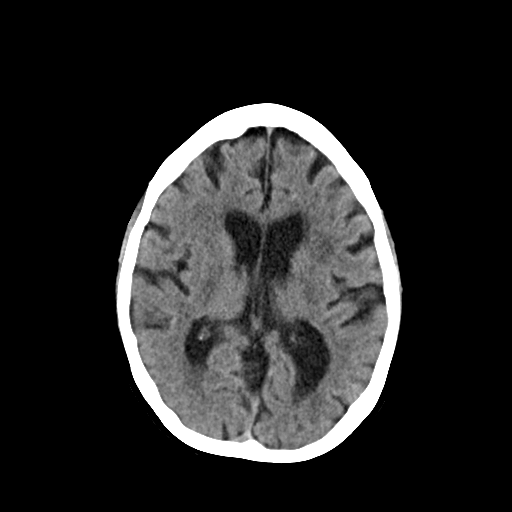
[im 19/34  brain]
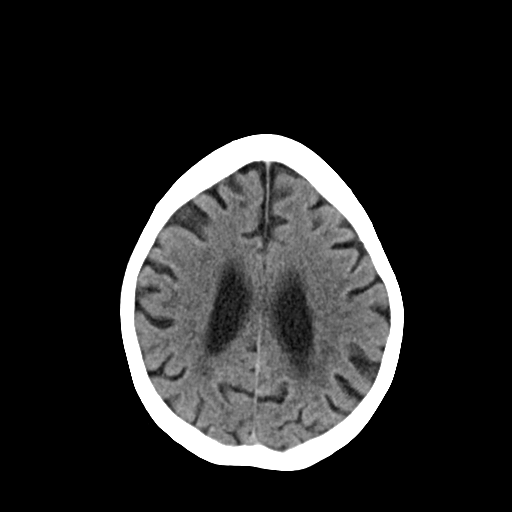
[im 21/34  brain]
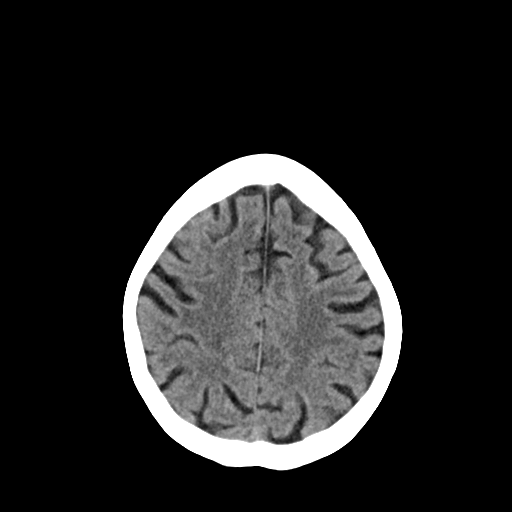
[im 23/34  brain]
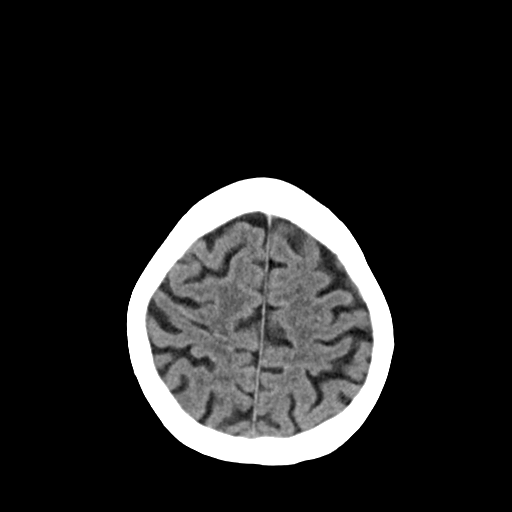
[im 23/34  bone]
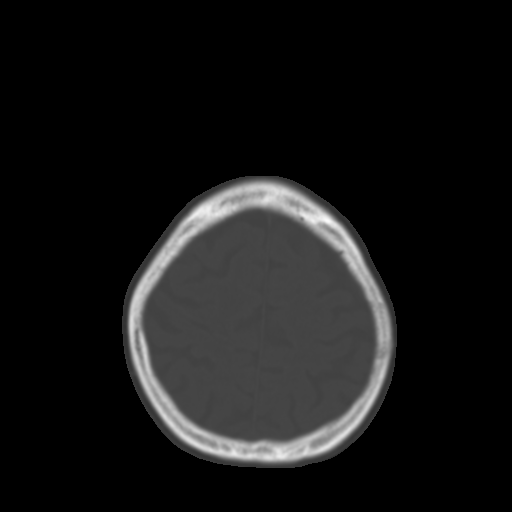
[im 27/34  brain]
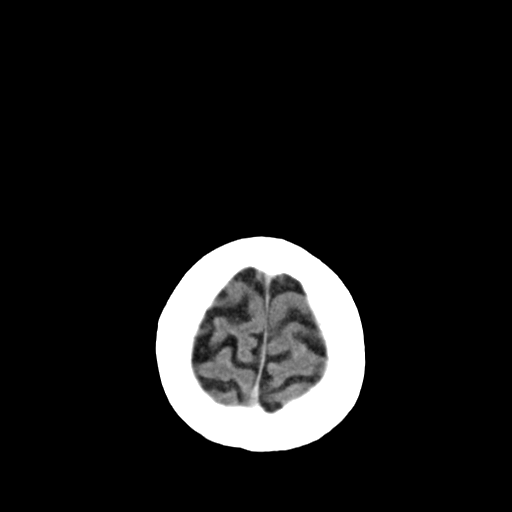
[im 29/34  brain]
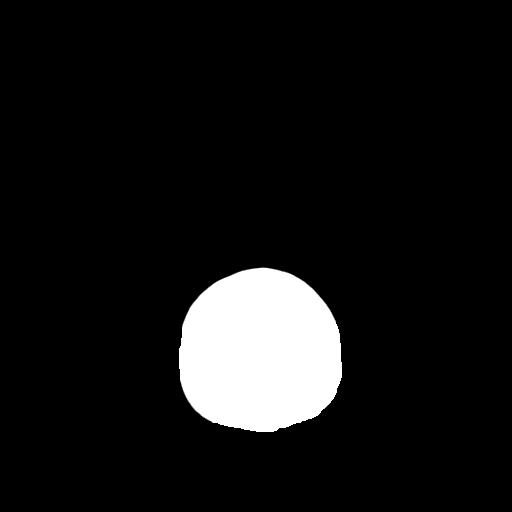
[im 31/34  brain]
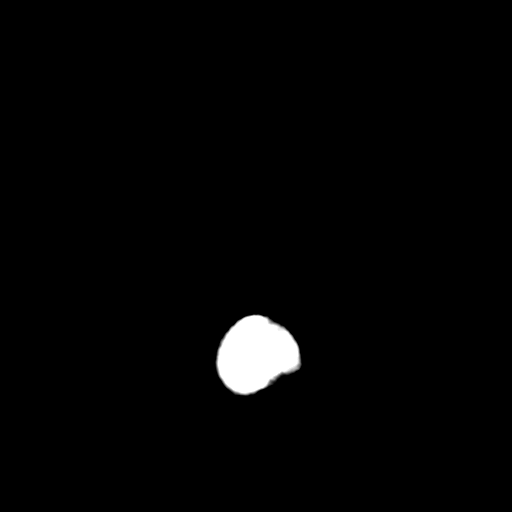

[Series 4: cor · coronal · 0.29mm/px · 3 of 81 slices shown]
[im 30/81  brain]
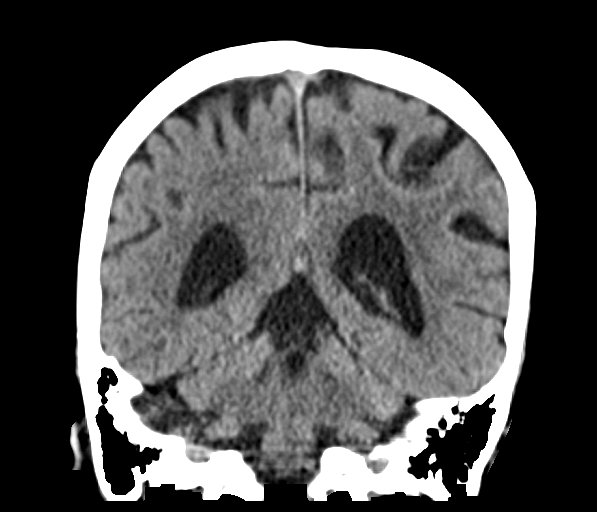
[im 38/81  brain]
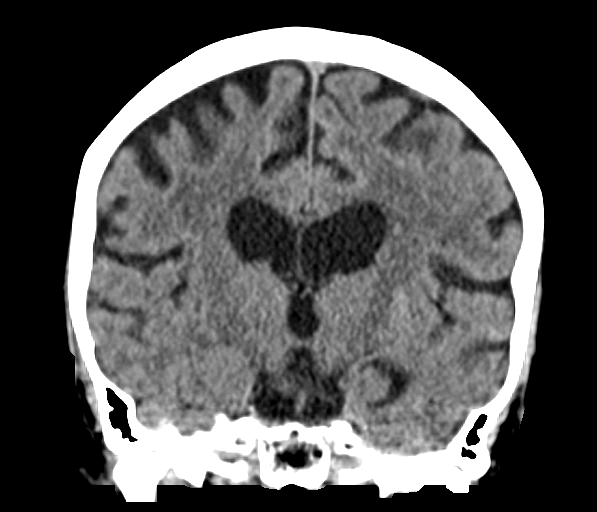
[im 45/81  brain]
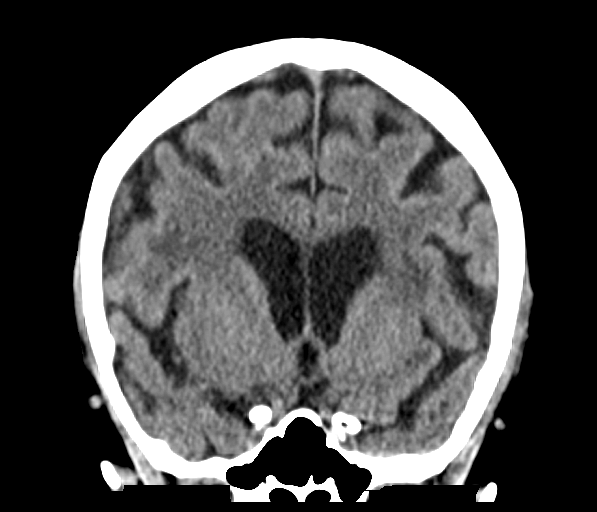

[15 of 40 positions shown; findings below may reference images not displayed]

FINDINGS: Brain: No midline shift, ventriculomegaly, mass effect, evidence of
mass lesion, intracranial hemorrhage or evidence of cortically based
acute infarction.

Stable gray-white matter differentiation with patchy bilateral white
matter hypodensity.

Vascular: Mild Calcified atherosclerosis at the skull base. No
suspicious intracranial vascular hyperdensity.

Skull: Stable and intact.

Sinuses/Orbits: Visualized paranasal sinuses and mastoids are stable
and well pneumatized.

Other: Visualized orbits and scalp soft tissues are within normal
limits.
IMPRESSION: 1. No acute intracranial abnormality. No acute traumatic injury
identified.
2. Stable mild for age nonspecific white matter changes since [DATE].

## 2020-03-19 ENCOUNTER — Other Ambulatory Visit: Payer: Self-pay

## 2020-03-19 ENCOUNTER — Emergency Department (HOSPITAL_COMMUNITY)
Admission: EM | Admit: 2020-03-19 | Discharge: 2020-03-19 | Disposition: A | Discharge status: Home / Self Care | Payer: Medicare PPO | Attending: Emergency Medicine | Admitting: Emergency Medicine

## 2020-03-19 ENCOUNTER — Encounter (HOSPITAL_COMMUNITY): Payer: Self-pay

## 2020-03-19 ENCOUNTER — Emergency Department (HOSPITAL_COMMUNITY): Payer: Medicare PPO

## 2020-03-19 DIAGNOSIS — I2510 Atherosclerotic heart disease of native coronary artery without angina pectoris: Secondary | ICD-10-CM | POA: Insufficient documentation

## 2020-03-19 DIAGNOSIS — R0789 Other chest pain: Secondary | ICD-10-CM | POA: Diagnosis not present

## 2020-03-19 DIAGNOSIS — R079 Chest pain, unspecified: Secondary | ICD-10-CM | POA: Diagnosis not present

## 2020-03-19 DIAGNOSIS — R0602 Shortness of breath: Secondary | ICD-10-CM | POA: Diagnosis not present

## 2020-03-19 LAB — CBC
HCT: 37.3 % (ref 36.0–46.0)
Hemoglobin: 12.1 g/dL (ref 12.0–15.0)
MCH: 30.3 pg (ref 26.0–34.0)
MCHC: 32.4 g/dL (ref 30.0–36.0)
MCV: 93.3 fL (ref 80.0–100.0)
Platelets: 264 10*3/uL (ref 150–400)
RBC: 4 MIL/uL (ref 3.87–5.11)
RDW: 12.7 % (ref 11.5–15.5)
WBC: 7.3 10*3/uL (ref 4.0–10.5)
nRBC: 0 % (ref 0.0–0.2)

## 2020-03-19 LAB — BASIC METABOLIC PANEL
Anion gap: 11 (ref 5–15)
BUN: 21 mg/dL (ref 8–23)
CO2: 23 mmol/L (ref 22–32)
Calcium: 9.4 mg/dL (ref 8.9–10.3)
Chloride: 103 mmol/L (ref 98–111)
Creatinine, Ser: 0.92 mg/dL (ref 0.44–1.00)
GFR calc Af Amer: >60 mL/min (ref >60)
GFR calc non Af Amer: >60 mL/min (ref >60)
Glucose, Bld: 115 mg/dL — ABNORMAL HIGH (ref 70–99)
Potassium: 4 mmol/L (ref 3.5–5.1)
Sodium: 137 mmol/L (ref 135–145)

## 2020-03-19 LAB — D-DIMER, QUANTITATIVE: D-Dimer, Quant: 0.52 ug/mL-FEU — ABNORMAL HIGH (ref 0.00–0.50)

## 2020-03-19 LAB — TROPONIN I (HIGH SENSITIVITY)
Troponin I (High Sensitivity): 4 ng/L (ref <18)
Troponin I (High Sensitivity): 3 ng/L (ref <18)

## 2020-03-19 MED ORDER — KETOROLAC TROMETHAMINE 15 MG/ML IJ SOLN
15.0000 mg | Freq: Once | INTRAMUSCULAR | Status: AC
  Administered 2020-03-19: 15 mg via INTRAVENOUS
  Filled 2020-03-19: qty 1

## 2020-03-19 MED ORDER — SODIUM CHLORIDE 0.9% FLUSH
3.0000 mL | Freq: Once | INTRAVENOUS | Status: AC
  Administered 2020-03-19: 3 mL via INTRAVENOUS

## 2020-03-19 NOTE — ED Provider Notes (Signed)
[facility] Emergency Department Provider Note MRN:  [id number]  Arrival date & time: 03/19/20     Chief Complaint   Chest Pain   History of Present Illness   Diane Richardson is a 69 y.o. year-old female with a history of DVT, CAD presenting to the ED with chief complaint of chest pain.  Location: Left sided chest, left neck Duration: Since 9 AM this morning Onset: Present upon awakening Timing: Constant Description: Sharp soreness Severity: Mild Exacerbating/Alleviating Factors: Worse with motion or palpation, worse with breathing Associated Symptoms: None; patient did fall and injure the left side of her upper body last week Pertinent Negatives: Denies fever, no cough, no dizziness, no diaphoresis, no nausea, no vomiting, no shortness of breath, no leg pain or swelling   Review of Systems  A complete 10 system review of systems was obtained and all systems are negative except as noted in the HPI and PMH.   Patient's Health History    Past Medical History:  Diagnosis Date   ADD (attention deficit disorder)    Anxiety    Coronary artery disease    Depression    DVT (deep venous thrombosis) (HCC)    Fatigue    chronic   Fibromyalgia    Hyperlipidemia    Muscle spasm    bad   PE (pulmonary embolism)    PTSD (post-traumatic stress disorder)     Past Surgical History:  Procedure Laterality Date   BUNIONECTOMY     CARDIAC CATHETERIZATION     CATARACT EXTRACTION  01/2020   Right eye   childbirth  1989   x1,NVD   CORONARY STENT INTERVENTION N/A 11/23/2018   Procedure: CORONARY STENT INTERVENTION;  Surgeon: Adrian Prows, MD;  Location: [place] [facility];  Service: Cardiovascular;  Laterality: N/A;  OM   FOOT SURGERY Right    pt states she had bunion surgery right foot.   HAND SURGERY     LEFT HEART CATH AND CORONARY ANGIOGRAPHY N/A 11/23/2018   Procedure: LEFT HEART CATH AND CORONARY ANGIOGRAPHY;  Surgeon: Adrian Prows, MD;   Location: [place] [facility];  Service: Cardiovascular;  Laterality: N/A;   PULMONARY EMBOLISM SURGERY  1983   x9 days    Family History  Problem Relation Age of Onset   Cancer Mother        ovarian    Peripheral vascular disease Mother 18   Hyperlipidemia Father    Hypertension Father    Multiple myeloma Father    CAD Father        CABG age 42   Heart disease Father        Pacer age 79   Diabetes Maternal Grandmother    Cancer Maternal Grandmother        stomach   Arthritis Brother    Dementia Maternal Grandfather    Colon polyps Maternal Grandfather    CAD Paternal Grandmother    Diabetes Paternal Grandmother    Dementia Paternal Grandfather     Social History   Socioeconomic History   Marital status: Married    Spouse name: Not on file   Number of children: 1   Years of education: PHD   Highest education level: Not on file  Occupational History   Occupation: disabled    Comment: FORMER TEACHER  Tobacco Use   Smoking status: Never Smoker   Smokeless tobacco: Never Used  Scientific laboratory technician Use: Never used  Substance and Sexual Activity   Alcohol  use: Yes    Comment: 7 glasses of wine weekly   Drug use: Never   Sexual activity: Yes  Other Topics Concern   Not on file  Social History Narrative   Drinks less than one cup of caffeine daily.   Social Determinants of Health   Financial Resource Strain: Low Risk    Difficulty of Paying Living Expenses: Not hard at all  Food Insecurity: No Food Insecurity   Worried About Charity fundraiser in the Last Year: Never true   [place] in the Last Year: Never true  Transportation Needs: No Transportation Needs   Lack of Transportation (Medical): No   Lack of Transportation (Non-Medical): No  Physical Activity: Insufficiently Active   Days of Exercise per Week: 4 days   Minutes of Exercise per Session: 30 min  Stress: Stress Concern Present   Feeling of Stress : To some  extent  Social Connections:    Frequency of Communication with Friends and Family:    Frequency of Social Gatherings with Friends and Family:    Attends Religious Services:    Active Member of Clubs or Organizations:    Attends Archivist Meetings:    Marital Status:   Intimate Partner Violence:    Fear of Current or Ex-Partner:    Emotionally Abused:    Physically Abused:    Sexually Abused:      Physical Exam   Vitals:   03/19/20 1441 03/19/20 1555  BP: (!) 102/53 125/60  Pulse: 63   Resp: 14 16  Temp:    SpO2: 100% 98%    CONSTITUTIONAL: Well-appearing, NAD NEURO:  Alert and oriented x 3, no focal deficits EYES:  eyes equal and reactive ENT/NECK:  no LAD, no JVD CARDIO: Regular rate, well-perfused, normal S1 and S2 PULM:  CTAB no wheezing or rhonchi GI/GU:  normal bowel sounds, non-distended, non-tender MSK/SPINE:  No gross deformities, no edema SKIN:  no rash, atraumatic PSYCH:  Appropriate speech and behavior  *Additional and/or pertinent findings included in MDM below  Diagnostic and Interventional Summary    EKG Interpretation  Date/Time:  Monday March 19 2020 09:44:42 EDT Ventricular Rate:  65 PR Interval:  144 QRS Duration: 74 QT Interval:  388 QTC Calculation: 403 R Axis:   71 Text Interpretation: Normal sinus rhythm Normal ECG Confirmed by Gerlene Fee [phone]) on 03/19/2020 3:43:41 PM      Labs Reviewed  BASIC METABOLIC PANEL - Abnormal; Notable for the following components:      Result Value   Glucose, Bld 115 (*)    All other components within normal limits  D-DIMER, QUANTITATIVE (NOT AT [facility]) - Abnormal; Notable for the following components:   D-Dimer, Quant 0.52 (*)    All other components within normal limits  CBC  TROPONIN I (HIGH SENSITIVITY)  TROPONIN I (HIGH SENSITIVITY)    DG Chest 2 View  Final Result      Medications  sodium chloride flush (NS) 0.9 % injection 3 mL (has no administration in time range)    ketorolac (TORADOL) 15 MG/ML injection 15 mg (15 mg Intravenous Given 03/19/20 1617)     Procedures  /  Critical Care Procedures  ED Course and Medical Decision Making  I have reviewed the triage vital signs, the nursing notes, and pertinent available records from the EMR.  Listed above are laboratory and imaging tests that I personally ordered, reviewed, and interpreted and then considered in my medical decision making (see below  for details).      Suspect MSK related pain, however given patient's history of DVT and PE and the fact that her pain is worse with breathing, will screen with D-dimer.  Highly doubt cardiac etiology, 2 troponins are negative and EKG is reassuring.  With negative D-dimer will discharge.  D-dimer is negative when age-adjusted, patient is appropriate for discharge.  Barth Kirks. [place] Small, [place] [email or web address]  Final Clinical Impressions(s) / ED Diagnoses     ICD-10-CM   1. Chest pain, unspecified type  R07.9     ED Discharge Orders    None       Discharge Instructions Discussed with and Provided to Patient:     Discharge Instructions     You were evaluated in the Emergency Department and after careful evaluation, we did not find any emergent condition requiring admission or further testing in the hospital.  Your exam/testing today is overall reassuring.  No evidence of heart damage or blood clots.  Suspect your pain is related to the muscles.  We recommend informing your primary care doctor and/or cardiologist of your ED visit.  We recommend Tylenol or Motrin at home for discomfort.  Please return to the Emergency Department if you experience any worsening of your condition.   Thank you for allowing [place] to be a part of your care.       Maudie Flakes, MD 03/19/20 [phone]

## 2020-03-19 NOTE — ED Triage Notes (Addendum)
Pt presents with Left side chest pain under her Left breast since last night, pt states the pain is worse with inhaling but not with exhaling. Pt reports she tripped and fell over her dog  approx 1 week ago landing on the area that is giving her some discomfort. Pt reports taking nitro x3 prior to arrival with no relief. Pt in no distress in triage, resp e/u   Pt also reports Left lateral neck pain starting this am, states' "it feels like a strain"

## 2020-03-19 NOTE — Discharge Instructions (Addendum)
You were evaluated in the Emergency Department and after careful evaluation, we did not find any emergent condition requiring admission or further testing in the hospital.  Your exam/testing today is overall reassuring.  No evidence of heart damage or blood clots.  Suspect your pain is related to the muscles.  We recommend informing your primary care doctor and/or cardiologist of your ED visit.  We recommend Tylenol or Motrin at home for discomfort.  Please return to the Emergency Department if you experience any worsening of your condition.   Thank you for allowing [place] to be a part of your care.

## 2020-03-19 NOTE — ED Notes (Signed)
Patient verbalizes understanding of discharge instructions. Opportunity for questioning and answers were provided. Armband removed by staff, pt discharged from ED.  

## 2020-03-20 ENCOUNTER — Ambulatory Visit: Payer: Medicare PPO

## 2020-03-20 DIAGNOSIS — I6523 Occlusion and stenosis of bilateral carotid arteries: Secondary | ICD-10-CM | POA: Diagnosis not present

## 2020-03-20 DIAGNOSIS — I25118 Atherosclerotic heart disease of native coronary artery with other forms of angina pectoris: Secondary | ICD-10-CM | POA: Diagnosis not present

## 2020-03-27 ENCOUNTER — Other Ambulatory Visit: Payer: Self-pay

## 2020-03-27 ENCOUNTER — Telehealth: Payer: Self-pay

## 2020-03-27 NOTE — Progress Notes (Signed)
Called and spoke with patient regarding her echocardiogram results.

## 2020-03-27 NOTE — Telephone Encounter (Signed)
Unable to leave cm regarding echo results.

## 2020-03-27 NOTE — Telephone Encounter (Signed)
-----   Message from Yates Decamp, MD sent at 03/24/2020  8:14 AM EDT ----- Normal LVEF. Moderate MR and no change from 10/2018.

## 2020-03-29 NOTE — Telephone Encounter (Signed)
Unable to leave vm.

## 2020-04-06 ENCOUNTER — Other Ambulatory Visit: Payer: Medicare PPO

## 2020-05-01 DIAGNOSIS — F3289 Other specified depressive episodes: Secondary | ICD-10-CM | POA: Diagnosis not present

## 2020-05-16 ENCOUNTER — Other Ambulatory Visit: Payer: Self-pay | Admitting: Cardiology

## 2020-06-12 ENCOUNTER — Ambulatory Visit: Payer: Medicare PPO | Attending: Internal Medicine

## 2020-06-12 DIAGNOSIS — Z23 Encounter for immunization: Secondary | ICD-10-CM

## 2020-06-12 NOTE — Progress Notes (Signed)
   Covid-19 Vaccination Clinic  Name:  [name]    MRN: [id number] DOB: [birth year]  06/12/2020  [name] was observed post Covid-19 immunization for 15 minutes without incident. She was provided with Vaccine Information Sheet and instruction to access the V-Safe system.   [name] was instructed to call 911 with any severe reactions post vaccine: Marland Kitchen Difficulty breathing  . Swelling of face and throat  . A fast heartbeat  . A bad rash all over body  . Dizziness and weakness

## 2020-06-17 ENCOUNTER — Other Ambulatory Visit: Payer: Self-pay | Admitting: Cardiology

## 2020-06-22 DIAGNOSIS — J340 Abscess, furuncle and carbuncle of nose: Secondary | ICD-10-CM | POA: Diagnosis not present

## 2020-06-29 DIAGNOSIS — J340 Abscess, furuncle and carbuncle of nose: Secondary | ICD-10-CM | POA: Diagnosis not present

## 2020-06-29 DIAGNOSIS — R194 Change in bowel habit: Secondary | ICD-10-CM | POA: Diagnosis not present

## 2020-08-01 ENCOUNTER — Ambulatory Visit: Payer: Medicare PPO | Admitting: Cardiology

## 2020-08-15 ENCOUNTER — Ambulatory Visit: Payer: Medicare PPO | Admitting: Cardiology

## 2020-08-15 ENCOUNTER — Other Ambulatory Visit: Payer: Self-pay

## 2020-08-15 ENCOUNTER — Encounter: Payer: Self-pay | Admitting: Cardiology

## 2020-08-15 VITALS — BP 126/57 | HR 60 | Resp 16 | Ht 61.0 in | Wt 136.0 lb

## 2020-08-15 DIAGNOSIS — I6523 Occlusion and stenosis of bilateral carotid arteries: Secondary | ICD-10-CM

## 2020-08-15 DIAGNOSIS — E782 Mixed hyperlipidemia: Secondary | ICD-10-CM

## 2020-08-15 DIAGNOSIS — I25118 Atherosclerotic heart disease of native coronary artery with other forms of angina pectoris: Secondary | ICD-10-CM

## 2020-08-15 MED ORDER — NITROGLYCERIN 0.4 MG SL SUBL: 0.4000 mg | SUBLINGUAL_TABLET | SUBLINGUAL | 3 refills | Status: DC | PRN

## 2020-08-15 NOTE — Progress Notes (Signed)
Primary Physician/Referring:  Carol Ada, MD  Patient ID: Diane Richardson, female    DOB: [birth year], 69 y.o.   MRN: [id number]  Chief Complaint  Patient presents with  . Coronary Artery Disease  . Carotid Stenosis  . Follow-up    6 months   HPI:    Diane Richardson  is a 69 y.o. female  with history of fibromyalgia, depression, bipolar disorder, hyperlipidemia, hyperlipidemia, and CAD s/p cath 11/23/2018 with stenting to OM1 and bilateral carotid stenosis.  She has had 1 episode of angina about 3 days ago for which she took 2 sublingual nitroglycerin.  Otherwise states that she is doing well, no dyspnea, no leg edema, no PND or orthopnea.  No symptoms to suggest TIA.  Past Medical History:  Diagnosis Date  . ADD (attention deficit disorder)   . Anxiety   . Coronary artery disease   . Depression   . DVT (deep venous thrombosis) ([place])   . Fatigue    chronic  . Fibromyalgia   . Hyperlipidemia   . Muscle spasm    bad  . PE (pulmonary embolism)   . PTSD (post-traumatic stress disorder)    Past Surgical History:  Procedure Laterality Date  . BUNIONECTOMY    . CARDIAC CATHETERIZATION    . CATARACT EXTRACTION  01/2020   Right eye  . childbirth  1989   x1,NVD  . CORONARY STENT INTERVENTION N/A 11/23/2018   Procedure: CORONARY STENT INTERVENTION;  Surgeon: Adrian Prows, MD;  Location: [facility];  Service: Cardiovascular;  Laterality: N/A;  OM  . FOOT SURGERY Right    pt states she had bunion surgery right foot.  Marland Kitchen HAND SURGERY    . LEFT HEART CATH AND CORONARY ANGIOGRAPHY N/A 11/23/2018   Procedure: LEFT HEART CATH AND CORONARY ANGIOGRAPHY;  Surgeon: Adrian Prows, MD;  Location: [facility];  Service: Cardiovascular;  Laterality: N/A;  . PULMONARY EMBOLISM SURGERY  1983   x9 days   Family History  Problem Relation Age of Onset  . Cancer Mother        ovarian   . Peripheral vascular disease Mother 37  . Hyperlipidemia Father   . Hypertension Father   .  Multiple myeloma Father   . CAD Father        CABG age 56  . Heart disease Father        Pacer age 64  . Diabetes Maternal Grandmother   . Cancer Maternal Grandmother        stomach  . Arthritis Brother   . Dementia Maternal Grandfather   . Colon polyps Maternal Grandfather   . CAD Paternal Grandmother   . Diabetes Paternal Grandmother   . Dementia Paternal Grandfather     Social History   Tobacco Use  . Smoking status: Never Smoker  . Smokeless tobacco: Never Used  Substance Use Topics  . Alcohol use: Yes    Comment: 7 glasses of wine weekly   Marital Status: Married  ROS  Review of Systems  Cardiovascular: Positive for chest pain and claudication. Negative for dyspnea on exertion, leg swelling and syncope.  Respiratory: Negative for shortness of breath.   Gastrointestinal: Negative for melena.   Objective  Blood pressure (!) 126/57, pulse 60, resp. rate 16, height _0  (1.549 m), weight 136 lb (61.7 kg), SpO2 100 %.  Vitals with BMI 08/15/2020 03/19/2020 03/19/2020  Height _1  - -  Weight 136 lbs - -  BMI 25.71 - -  Systolic 161 096 045  Diastolic 57 72 60  Pulse 60 67 -  Some encounter information is confidential and restricted. Go to Review Flowsheets activity to see all data.     Physical Exam Constitutional:      General: She is not in acute distress.    Appearance: She is well-developed.  Cardiovascular:     Rate and Rhythm: Normal rate and regular rhythm.     Pulses: Intact distal pulses.     Heart sounds: No murmur heard.  No gallop.      Comments: No leg edema, no JVD.  Pulmonary:     Effort: Pulmonary effort is normal. No accessory muscle usage.     Breath sounds: Normal breath sounds.  Abdominal:     General: Bowel sounds are normal.     Palpations: Abdomen is soft.    Laboratory examination:   Recent Labs    10/05/19 1106 01/27/20 1616 03/19/20 0959  NA 141 142 137  K 5.0 4.9 4.0  CL 103 105 103  CO2 _0 GLUCOSE 88 98 115*   BUN _1 CREATININE 0.87 0.96 0.92  CALCIUM 9.9 9.5 9.4  GFRNONAA 69 61 >60  GFRAA 80 70 >60   CrCl cannot be calculated (Patient's most recent lab result is older than the maximum 21 days allowed.).  CMP Latest Ref Rng & Units 03/19/2020 01/27/2020 10/05/2019  Glucose 70 - 99 mg/dL 115(H) 98 88  BUN 8 - 23 mg/dL _2 Creatinine 0.44 - 1.00 mg/dL 0.92 0.96 0.87  Sodium 135 - 145 mmol/L 137 142 141  Potassium 3.5 - 5.1 mmol/L 4.0 4.9 5.0  Chloride 98 - 111 mmol/L 103 105 103  CO2 22 - 32 mmol/L _3 Calcium 8.9 - 10.3 mg/dL 9.4 9.5 9.9  Total Protein 6.0 - 8.5 g/dL - 6.3 6.9  Total Bilirubin 0.0 - 1.2 mg/dL - <0.2 0.3  Alkaline Phos 48 - 121 IU/L - 72 70  AST 0 - 40 IU/L - 23 42(H)  ALT 0 - 32 IU/L - 24 59(H)   CBC Latest Ref Rng & Units 03/19/2020 11/15/2018 03/01/2018  WBC 4.0 - 10.5 K/uL 7.3 5.2 4.2  Hemoglobin 12.0 - 15.0 g/dL 12.1 12.7 13.0  Hematocrit 36 - 46 % 37.3 36.5 38.4  Platelets 150 - 400 K/uL 264 247 244   Lipid Panel     Component Value Date/Time   CHOL 167 01/27/2020 1616   TRIG 98 01/27/2020 1616   HDL 79 01/27/2020 1616   CHOLHDL 2.1 01/27/2020 1616   LDLCALC 70 01/27/2020 1616   HEMOGLOBIN A1C No results found for: HGBA1C, MPG TSH No results for input(s): TSH in the last 8760 hours.  External labs:  None  Medications and allergies   Allergies  Allergen Reactions  . Penicillins Hives and Diarrhea    Did it involve swelling of the face/tongue/throat, SOB, or low BP? No Did it involve sudden or severe rash/hives, skin peeling, or any reaction on the inside of your mouth or nose? Yes Did you need to seek medical attention at a hospital or doctor's office? Yes When did it last happen?childhood allergy If all above answers are "NO", may proceed with cephalosporin use.   . Codeine Hives and Diarrhea  . Donnatal [Belladonna Alk-Phenobarb Er] Rash  . Percocet [Oxycodone-Acetaminophen] Other (See Comments)    delusional    Current  Outpatient Medications on File Prior to Visit  Medication Sig  Dispense Refill  . bismuth subsalicylate (PEPTO BISMOL) 262 MG/15ML suspension Take 30 mLs by mouth every 6 (six) hours as needed for indigestion.    . celecoxib (CELEBREX) 200 MG capsule TAKE 1 CAPSULE(200 MG) BY MOUTH DAILY AS NEEDED 30 capsule 0  . Cholecalciferol (VITAMIN D) 50 MCG (2000 UT) CAPS Take 400 Units by mouth daily.     . Coenzyme Q10 (COQ-10) 200 MG CAPS Take 200 mg by mouth daily.    . diclofenac sodium (VOLTAREN) 1 % GEL Apply 1 application topically 4 (four) times daily as needed (pain).    . DULoxetine (CYMBALTA) 60 MG capsule Take 60 mg by mouth 2 (two) times daily.     Marland Kitchen LORazepam (ATIVAN) 0.5 MG tablet Take 1 tablet (0.5 mg total) by mouth 2 (two) times daily. (Patient taking differently: Take 0.5 mg by mouth See admin instructions. Take 0.5 mg in the morning, may take a second 0.5 mg dose as needed for anxiety) 30 tablet 0  . Menthol-Zinc Oxide (GOLD BOND EX) Apply 1 application topically daily as needed (pain/itching).    . metoprolol succinate (TOPROL-XL) 25 MG 24 hr tablet TAKE 1 TABLET BY MOUTH EVERY NIGHT AT BEDTIME 90 tablet 0  . nitroGLYCERIN (NITROSTAT) 0.4 MG SL tablet DISSOLVE 1 TABLET UNDER THE TONGUE EVERY 5 MINUTES AS NEEDED FOR CHEST PAIN 25 tablet 3  . omeprazole (PRILOSEC) 40 MG capsule Take 40 mg by mouth daily.    . rosuvastatin (CRESTOR) 20 MG tablet TAKE 1 TABLET(20 MG) BY MOUTH DAILY. Needs to schedule f/u for upcoming appts. 30 tablet .  Marland Kitchen VRAYLAR capsule Take 1.5 mg by mouth daily.     No current facility-administered medications on file prior to visit.    Radiology:   CT Head Wo Contrast 12/05/2019: Chronic atrophic and ischemic changes without acute abnormality.  Cardiac Studies:   Exercise sestamibi stress test 10/25/2018: 1. The patient performed treadmill exercise using Bruce protocol, completing 5:03 minutes. The patient completed an estimated workload of 7 METS, reaching 86% of  the maximum predicted heart rate. Exercise capacity was low. Hemodynamic response was normal. Stress symptoms included exercise induced chest pain, relieved with rest.  2. The overall quality of the study is good. There is no evidence of abnormal lung activity. Stress and rest SPECT images demonstrate homogeneous tracer distribution throughout the myocardium. TID was mildly increased at 1.1. Gated SPECT imaging reveals normal myocardial thickening and wall motion. The left ventricular ejection fraction was normal (66%).  3. Intermediate risk study given symptoms and EKG changes. No SPECT evidence of ischemia or infarction see. Clinical correlation recommended.  Coronary angiogram 11/23/2018: Left dominant circulation, focal high-grade 99% stenosis in the very large OM1, otherwise tortuous LAD and circumflex coronary artery, no significant disease. S/P stenting of OM1 with 2.5 x 15 mm Orsiro DES at 11 atmospheric pressure giving a 2.75 mm lumen. Stenosis reduced from 99% to 0% with maintenance of TIMI-3 to TIMI-3 flow.  Carotid artery duplex 10/05/2019:  Stenosis in the right internal carotid artery (16-49%). Mild stenosis in the right external carotid artery (<50%).  Stenosis in the left internal carotid artery (50-69%). Mild stenosis in the left external carotid artery (<50%).  Antegrade right vertebral artery flow. Antegrade left vertebral artery flow.  Compared to 04/25/19, right ICA stenosis was >50%. Follow up in six months is appropriate if clinically indicated.  Echocardiogram 03/20/2020: Left ventricle cavity is normal in size and wall thickness. Normal global wall motion. Normal LV systolic function with  EF 60%. Doppler evidence of grade I (impaired) diastolic dysfunction, normal LAP.  Moderate (Grade II) mitral regurgitation. Normal right atrial pressure.  No significant change compared to previous study on 10/29/2018.    EKG    EKG 08/15/2020: Sinus bradycardia at rate of 55  bpm, normal axis, incomplete right bundle branch block.  No evidence of ischemia.   No significant change from 11/01/2019.  Assessment     ICD-10-CM   1. Atherosclerosis of native coronary artery of native heart with stable angina pectoris ([place])  I25.118 EKG 12-Lead  2. Asymptomatic carotid artery stenosis, bilateral  I65.23 PCV CAROTID DUPLEX (BILATERAL)  3. Mixed hyperlipidemia  E78.2      No orders of the defined types were placed in this encounter.   Medications Discontinued During This Encounter  Medication Reason  . aspirin EC 81 MG tablet Change in therapy  . ketoconazole (NIZORAL) 2 % cream Patient Preference    Recommendations:   Diane Richardson  is a 69 y.o. female  with history of fibromyalgia, depression, bipolar disorder, hyperlipidemia, and CAD s/p cath 11/23/2018 with stenting to OM1 and asymptomatic bilateral carotid stenosis.  She has had 1 episode of angina pectoris for which she had to take 2 sublingual nitroglycerin.  But otherwise she continues to remain active, still riding horses and also tries to walk.  She has noticed occasional episodes of leg weakness but has not had any calf cramping or symptoms suggestive of claudication.  Vascular examination is normal except for carotid bruit.  With regard to coronary disease we will continue present medical management, lipids are well controlled.  She will use nitroglycerin as needed.  Unless anginal symptoms get worse, no further evaluation is needed.  With regard to carotid artery stenosis, she needs duplex which I will schedule.  Otherwise remained stable, on appropriate medical therapy, I will see her back in 6 months.   Adrian Prows, MD, [facility] 08/15/2020, [place] PM Office: [phone] Pager: [phone]

## 2020-08-29 DIAGNOSIS — F3289 Other specified depressive episodes: Secondary | ICD-10-CM | POA: Diagnosis not present

## 2020-08-30 ENCOUNTER — Other Ambulatory Visit: Payer: Self-pay | Admitting: Cardiology

## 2020-09-10 DIAGNOSIS — R194 Change in bowel habit: Secondary | ICD-10-CM | POA: Diagnosis not present

## 2020-09-10 DIAGNOSIS — E739 Lactose intolerance, unspecified: Secondary | ICD-10-CM | POA: Diagnosis not present

## 2020-09-10 DIAGNOSIS — K219 Gastro-esophageal reflux disease without esophagitis: Secondary | ICD-10-CM | POA: Diagnosis not present

## 2020-09-10 DIAGNOSIS — R140 Abdominal distension (gaseous): Secondary | ICD-10-CM | POA: Diagnosis not present

## 2020-09-27 DIAGNOSIS — Z01812 Encounter for preprocedural laboratory examination: Secondary | ICD-10-CM | POA: Diagnosis not present

## 2020-09-28 ENCOUNTER — Other Ambulatory Visit: Payer: Self-pay | Admitting: Cardiology

## 2020-10-02 DIAGNOSIS — K219 Gastro-esophageal reflux disease without esophagitis: Secondary | ICD-10-CM | POA: Diagnosis not present

## 2020-10-02 DIAGNOSIS — R12 Heartburn: Secondary | ICD-10-CM | POA: Diagnosis not present

## 2020-10-02 DIAGNOSIS — K640 First degree hemorrhoids: Secondary | ICD-10-CM | POA: Diagnosis not present

## 2020-10-02 DIAGNOSIS — Z1211 Encounter for screening for malignant neoplasm of colon: Secondary | ICD-10-CM | POA: Diagnosis not present

## 2020-11-17 ENCOUNTER — Other Ambulatory Visit: Payer: Self-pay | Admitting: Cardiology

## 2020-12-05 ENCOUNTER — Other Ambulatory Visit (HOSPITAL_COMMUNITY)
Admission: RE | Admit: 2020-12-05 | Discharge: 2020-12-05 | Disposition: A | Discharge status: Home / Self Care | Payer: Medicare PPO | Source: Ambulatory Visit | Attending: Family Medicine | Admitting: Family Medicine

## 2020-12-05 ENCOUNTER — Other Ambulatory Visit: Payer: Self-pay | Admitting: Family Medicine

## 2020-12-05 DIAGNOSIS — Z1389 Encounter for screening for other disorder: Secondary | ICD-10-CM | POA: Diagnosis not present

## 2020-12-05 DIAGNOSIS — Z124 Encounter for screening for malignant neoplasm of cervix: Secondary | ICD-10-CM | POA: Insufficient documentation

## 2020-12-05 DIAGNOSIS — Z23 Encounter for immunization: Secondary | ICD-10-CM | POA: Diagnosis not present

## 2020-12-05 DIAGNOSIS — F319 Bipolar disorder, unspecified: Secondary | ICD-10-CM | POA: Diagnosis not present

## 2020-12-05 DIAGNOSIS — Z1151 Encounter for screening for human papillomavirus (HPV): Secondary | ICD-10-CM | POA: Diagnosis not present

## 2020-12-05 DIAGNOSIS — Z0000 Encounter for general adult medical examination without abnormal findings: Secondary | ICD-10-CM | POA: Diagnosis not present

## 2020-12-05 DIAGNOSIS — G4700 Insomnia, unspecified: Secondary | ICD-10-CM | POA: Diagnosis not present

## 2020-12-05 DIAGNOSIS — E785 Hyperlipidemia, unspecified: Secondary | ICD-10-CM | POA: Diagnosis not present

## 2020-12-05 DIAGNOSIS — Z1159 Encounter for screening for other viral diseases: Secondary | ICD-10-CM | POA: Diagnosis not present

## 2020-12-05 DIAGNOSIS — M797 Fibromyalgia: Secondary | ICD-10-CM | POA: Diagnosis not present

## 2020-12-07 LAB — CYTOLOGY - PAP
Comment: NEGATIVE
Diagnosis: NEGATIVE
High risk HPV: NEGATIVE

## 2020-12-17 ENCOUNTER — Other Ambulatory Visit: Payer: Self-pay | Admitting: Cardiology

## 2020-12-20 ENCOUNTER — Other Ambulatory Visit: Payer: Self-pay | Admitting: Cardiology

## 2020-12-24 ENCOUNTER — Ambulatory Visit: Payer: Medicare PPO | Attending: Internal Medicine

## 2020-12-24 ENCOUNTER — Other Ambulatory Visit: Payer: Self-pay

## 2020-12-24 ENCOUNTER — Other Ambulatory Visit (HOSPITAL_BASED_OUTPATIENT_CLINIC_OR_DEPARTMENT_OTHER): Payer: Self-pay

## 2020-12-24 DIAGNOSIS — Z23 Encounter for immunization: Secondary | ICD-10-CM

## 2020-12-24 MED ORDER — COVID-19 MRNA VACCINE (PFIZER) 30 MCG/0.3ML IM SUSP
INTRAMUSCULAR | 0 refills | Status: DC
  Filled 2020-12-24: qty 0.3, 1d supply, fill #0

## 2020-12-24 NOTE — Progress Notes (Signed)
   Covid-19 Vaccination Clinic  Name:  Diane Richardson    MRN: [id number] DOB: 1951-02-15  12/24/2020  [name] was observed post Covid-19 immunization for 15 minutes without incident. She was provided with Vaccine Information Sheet and instruction to access the V-Safe system.   [name] was instructed to call 911 with any severe reactions post vaccine: Marland Kitchen Difficulty breathing  . Swelling of face and throat  . A fast heartbeat  . A bad rash all over body  . Dizziness and weakness   Immunizations Administered    Name Date Dose VIS Date Route   PFIZER Comrnaty(Gray TOP) Covid-19 Vaccine 12/24/2020 11:17 AM 0.3 mL 08/16/2020 Intramuscular   Manufacturer: [employer], [employer]   Lot: [id number]   NDC: [phone]

## 2021-01-21 ENCOUNTER — Other Ambulatory Visit: Payer: Medicare PPO

## 2021-01-22 ENCOUNTER — Other Ambulatory Visit: Payer: Self-pay

## 2021-01-22 ENCOUNTER — Ambulatory Visit: Payer: Medicare PPO

## 2021-01-22 DIAGNOSIS — I6523 Occlusion and stenosis of bilateral carotid arteries: Secondary | ICD-10-CM

## 2021-01-23 NOTE — Progress Notes (Signed)
Carotid artery duplex 01/22/2021: Doppler velocity suggests stenosis in the right internal carotid artery (50-69%), upper limit of the spectrum The right PSV internal/common carotid artery ratio of 4.84 is consistent with a stenosis of >70%. Mild stenosis in the right external carotid artery (<50%). Stenosis in the left internal carotid artery (50-69%). Mild stenosis in the left external carotid artery (<50%). Antegrade right vertebral artery flow.  Follow up in six months is appropriate if clinically indicated. No significant change from 10/05/2019, mild progression of the disease on right ICA.  Will discuss on OV

## 2021-01-29 DIAGNOSIS — Z1231 Encounter for screening mammogram for malignant neoplasm of breast: Secondary | ICD-10-CM | POA: Diagnosis not present

## 2021-02-13 ENCOUNTER — Ambulatory Visit: Payer: Medicare PPO | Admitting: Cardiology

## 2021-02-15 ENCOUNTER — Other Ambulatory Visit: Payer: Self-pay | Admitting: Cardiology

## 2021-02-20 DIAGNOSIS — F330 Major depressive disorder, recurrent, mild: Secondary | ICD-10-CM | POA: Diagnosis not present

## 2021-02-28 DIAGNOSIS — Z20822 Contact with and (suspected) exposure to covid-19: Secondary | ICD-10-CM | POA: Diagnosis not present

## 2021-03-06 ENCOUNTER — Ambulatory Visit: Payer: Medicare PPO | Admitting: Cardiology

## 2021-03-06 ENCOUNTER — Other Ambulatory Visit: Payer: Self-pay

## 2021-03-06 ENCOUNTER — Encounter: Payer: Self-pay | Admitting: Cardiology

## 2021-03-06 VITALS — BP 105/67 | HR 65 | Temp 97.4°F | Resp 17 | Ht 61.0 in | Wt 133.6 lb

## 2021-03-06 DIAGNOSIS — E7800 Pure hypercholesterolemia, unspecified: Secondary | ICD-10-CM | POA: Diagnosis not present

## 2021-03-06 DIAGNOSIS — I25118 Atherosclerotic heart disease of native coronary artery with other forms of angina pectoris: Secondary | ICD-10-CM | POA: Diagnosis not present

## 2021-03-06 DIAGNOSIS — I6523 Occlusion and stenosis of bilateral carotid arteries: Secondary | ICD-10-CM

## 2021-03-06 NOTE — Progress Notes (Signed)
Primary Physician/Referring:  Carol Ada, MD  Patient ID: Diane Richardson, female    DOB: May 02, 1951, 70 y.o.   MRN: [id number]  Chief Complaint  Patient presents with   Coronary Artery Disease   Follow-up    6 month   carotid stenosis   HPI:    Diane Richardson  is a 70 y.o. female  with history of fibromyalgia, depression, bipolar disorder, hyperlipidemia, hyperlipidemia, and CAD s/p cath 11/23/2018 with stenting to OM1 and bilateral carotid stenosis.  This is a 76-monthoffice visit, fortunately she has not had recurrence of angina pectoris.  She is tolerating all her medications well. She continues to remain active, still riding horses and also tries to walk.  Past Medical History:  Diagnosis Date   ADD (attention deficit disorder)    Anxiety    Coronary artery disease    Depression    DVT (deep venous thrombosis) (HCC)    Fatigue    chronic   Fibromyalgia    Hyperlipidemia    Muscle spasm    bad   PE (pulmonary embolism)    PTSD (post-traumatic stress disorder)    Past Surgical History:  Procedure Laterality Date   BUNIONECTOMY     CARDIAC CATHETERIZATION     CATARACT EXTRACTION  01/2020   Right eye   childbirth  1989   x1,NVD   CORONARY STENT INTERVENTION N/A 11/23/2018   Procedure: CORONARY STENT INTERVENTION;  Surgeon: GAdrian Prows MD;  [facility];  Service: Cardiovascular;  Laterality: N/A;  OM   FOOT SURGERY Right    pt states she had bunion surgery right foot.   HAND SURGERY     LEFT HEART CATH AND CORONARY ANGIOGRAPHY N/A 11/23/2018   Procedure: LEFT HEART CATH AND CORONARY ANGIOGRAPHY;  Surgeon: GAdrian Prows MD;  [facility];  Service: Cardiovascular;  Laterality: N/A;   PULMONARY EMBOLISM SURGERY  1983   x9 days   Family History  Problem Relation Age of Onset   Cancer Mother        ovarian    Peripheral vascular disease Mother 434  Hyperlipidemia Father    Hypertension Father    Multiple myeloma Father    CAD  Father        CABG age 69  Heart disease Father        Pacer age 69  Diabetes Maternal Grandmother    Cancer Maternal Grandmother        stomach   Arthritis Brother    Dementia Maternal Grandfather    Colon polyps Maternal Grandfather    CAD Paternal Grandmother    Diabetes Paternal Grandmother    Dementia Paternal Grandfather     Social History   Tobacco Use   Smoking status: Never   Smokeless tobacco: Never  Substance Use Topics   Alcohol use: Yes    Comment: 7 glasses of wine weekly   Marital Status: Married  ROS  Review of Systems  Cardiovascular:  Negative for chest pain, claudication, dyspnea on exertion, leg swelling and syncope.  Gastrointestinal:  Negative for melena.  Objective  Blood pressure 105/67, pulse 65, temperature (!) 97.4 F (36.3 C), temperature source Temporal, resp. rate 17, height _0  (1.549 m), weight 133 lb 9.6 oz (60.6 kg), SpO2 96 %.  Vitals with BMI 03/06/2021 08/15/2020 03/19/2020  Height _1  _2  -  Weight 133 lbs 10 oz 136 lbs -  BMI 247.82295.62-  Systolic  [phone]  Diastolic 67 57 72  Pulse 65 60 67  Some encounter information is confidential and restricted. Go to Review Flowsheets activity to see all data.     Physical Exam Constitutional:      General: She is not in acute distress.    Appearance: She is well-developed.  Neck:     Vascular: Carotid bruit (Bilateral right greater than left) present. No JVD.  Cardiovascular:     Rate and Rhythm: Normal rate and regular rhythm.     Pulses: Intact distal pulses.     Heart sounds: No murmur heard.   No gallop.  Pulmonary:     Effort: Pulmonary effort is normal. No accessory muscle usage.     Breath sounds: Normal breath sounds.  Abdominal:     General: Bowel sounds are normal.     Palpations: Abdomen is soft.   Laboratory examination:   Recent Labs    03/19/20 0959  NA 137  K 4.0  CL 103  CO2 23  GLUCOSE 115*  BUN 21  CREATININE 0.92  CALCIUM 9.4  GFRNONAA >60   GFRAA >60   CrCl cannot be calculated (Patient's most recent lab result is older than the maximum 21 days allowed.).  CMP Latest Ref Rng & Units 03/19/2020 01/27/2020 10/05/2019  Glucose 70 - 99 mg/dL 115(H) 98 88  BUN 8 - 23 mg/dL _0 Creatinine 0.44 - 1.00 mg/dL 0.92 0.96 0.87  Sodium 135 - 145 mmol/L 137 142 141  Potassium 3.5 - 5.1 mmol/L 4.0 4.9 5.0  Chloride 98 - 111 mmol/L 103 105 103  CO2 22 - 32 mmol/L _1 Calcium 8.9 - 10.3 mg/dL 9.4 9.5 9.9  Total Protein 6.0 - 8.5 g/dL - 6.3 6.9  Total Bilirubin 0.0 - 1.2 mg/dL - <0.2 0.3  Alkaline Phos 48 - 121 IU/L - 72 70  AST 0 - 40 IU/L - 23 42(H)  ALT 0 - 32 IU/L - 24 59(H)   CBC Latest Ref Rng & Units 03/19/2020 11/15/2018 03/01/2018  WBC 4.0 - 10.5 K/uL 7.3 5.2 4.2  Hemoglobin 12.0 - 15.0 g/dL 12.1 12.7 13.0  Hematocrit 36.0 - 46.0 % 37.3 36.5 38.4  Platelets 150 - 400 K/uL 264 247 244   Lipid Panel     Component Value Date/Time   CHOL 167 01/27/2020 1616   TRIG 98 01/27/2020 1616   HDL 79 01/27/2020 1616   CHOLHDL 2.1 01/27/2020 1616   LDLCALC 70 01/27/2020 1616   External labs:   12/05/2020:  Hb 13.0/HCT 39.7, platelets 222, normal indicis.  Serum glucose 92 mg, BUN 24, creatinine 0.88, EGFR 71 normal, potassium 4.0, CMP otherwise normal.  TSH normal at 2.44.  Total cholesterol 183, triglycerides 89, HDL 71, LDL 96, non-HDL cholesterol 112.  Medications and allergies   Allergies  Allergen Reactions   Penicillins Hives and Diarrhea    Did it involve swelling of the face/tongue/throat, SOB, or low BP? No Did it involve sudden or severe rash/hives, skin peeling, or any reaction on the inside of your mouth or nose? Yes Did you need to seek medical attention at a hospital or doctor's office? Yes When did it last happen?      childhood allergy If all above answers are "NO", may proceed with cephalosporin use.    Codeine Hives and Diarrhea   Donnatal [Belladonna Alk-Phenobarb Er] Rash   Percocet  [Oxycodone-Acetaminophen] Other (See Comments)    delusional  Current Outpatient Medications on File Prior to Visit  Medication Sig Dispense Refill   aspirin 325 MG tablet Take 1 tablet by mouth daily.     bismuth subsalicylate (PEPTO BISMOL) 262 MG/15ML suspension Take 30 mLs by mouth every 6 (six) hours as needed for indigestion.     celecoxib (CELEBREX) 200 MG capsule TAKE 1 CAPSULE(200 MG) BY MOUTH DAILY AS NEEDED 30 capsule 0   Cholecalciferol (VITAMIN D) 50 MCG (2000 UT) CAPS Take 400 Units by mouth daily.      Coenzyme Q10 (COQ-10) 200 MG CAPS Take 200 mg by mouth daily.     diclofenac sodium (VOLTAREN) 1 % GEL Apply 1 application topically 4 (four) times daily as needed (pain).     DULoxetine (CYMBALTA) 60 MG capsule Take 60 mg by mouth 2 (two) times daily.      LORazepam (ATIVAN) 0.5 MG tablet Take 1 tablet (0.5 mg total) by mouth 2 (two) times daily. (Patient taking differently: Take 0.5 mg by mouth See admin instructions. Take 0.5 mg in the morning, may take a second 0.5 mg dose as needed for anxiety) 30 tablet 0   Menthol-Zinc Oxide (GOLD BOND EX) Apply 1 application topically daily as needed (pain/itching).     metoprolol succinate (TOPROL-XL) 25 MG 24 hr tablet TAKE 1 TABLET BY MOUTH EVERY NIGHT AT BEDTIME 90 tablet 0   nitroGLYCERIN (NITROSTAT) 0.4 MG SL tablet Place 1 tablet (0.4 mg total) under the tongue every 5 (five) minutes as needed for chest pain. 25 tablet 3   omeprazole (PRILOSEC) 40 MG capsule Take 40 mg by mouth daily.     rosuvastatin (CRESTOR) 20 MG tablet TAKE 1 TABLET(20 MG) BY MOUTH DAILY 90 tablet 0   VRAYLAR capsule Take 1.5 mg by mouth daily.     No current facility-administered medications on file prior to visit.    Radiology:   CT Head Wo Contrast 12/05/2019: Chronic atrophic and ischemic changes without acute abnormality.  Cardiac Studies:   Exercise sestamibi stress test 10/25/2018: 1. The patient performed treadmill exercise using Bruce protocol,  completing 5:03 minutes. The patient completed an estimated workload of 7 METS, reaching 86% of the maximum predicted heart rate. Exercise capacity was low. Hemodynamic response was normal. Stress symptoms included exercise induced chest pain, relieved with rest.   2. The overall quality of the study is good. There is no evidence of abnormal lung activity. Stress and rest SPECT images demonstrate homogeneous tracer distribution throughout the myocardium. TID was mildly increased at 1.1. Gated SPECT imaging reveals normal myocardial thickening and wall motion. The left ventricular ejection fraction was normal (66%).   3. Intermediate risk study given symptoms and EKG changes. No SPECT evidence of ischemia or infarction see. Clinical correlation recommended.  Coronary angiogram 11/23/2018: Left dominant circulation, focal high-grade 99% stenosis in the very large OM1, otherwise tortuous LAD and circumflex coronary artery, no significant disease. S/P stenting of OM1 with 2.5 x 15 mm Orsiro DES at 11 atmospheric pressure giving a 2.75 mm lumen.  Stenosis reduced from 99% to 0% with maintenance of TIMI-3 to TIMI-3 flow.  Echocardiogram 03/20/2020: Left ventricle cavity is normal in size and wall thickness. Normal global wall motion. Normal LV systolic function with EF 60%. Doppler evidence of grade I (impaired) diastolic dysfunction, normal LAP.  Moderate (Grade II) mitral regurgitation. Normal right atrial pressure.  No significant change compared to previous study on 10/29/2018.    Carotid artery duplex 01/22/2021: Doppler velocity suggests stenosis in the right  internal carotid artery (50-69%), upper limit of the spectrum The right PSV internal/common carotid artery ratio of 4.84 is consistent with a stenosis of >70%. Mild stenosis in the right external carotid artery (<50%). Stenosis in the left internal carotid artery (50-69%). Mild stenosis in the left external carotid artery (<50%). Antegrade right  vertebral artery flow.  Follow up in six months is appropriate if clinically indicated. No significant change from 10/05/2019, mild progression of the disease on right ICA.  EKG  EKG 03/06/2021: Normal sinus rhythm at rate of 61 bpm, normal axis.  Incomplete right bundle branch block.  No evidence of ischemia, normal EKG.  No significant change from 08/15/2020.  Assessment     ICD-10-CM   1. Atherosclerosis of native coronary artery of native heart with stable angina pectoris ([place])  I25.118 EKG 12-Lead    2. Asymptomatic carotid artery stenosis, bilateral  I65.23     3. Pure hypercholesterolemia  E78.00 Lipid Panel With LDL/HDL Ratio    LDL cholesterol, direct       No orders of the defined types were placed in this encounter.   Medications Discontinued During This Encounter  Medication Reason   COVID-19 mRNA vaccine, Pfizer, 30 MCG/0.3ML injection Error    Recommendations:   SHANEAL BARASCH  is a 70 y.o. female  with history of fibromyalgia, depression, bipolar disorder, hyperlipidemia, and CAD s/p cath 11/23/2018 with stenting to OM1 and asymptomatic bilateral carotid stenosis.  This is a 38-monthoffice visit, fortunately she has not had recurrence of angina pectoris.  She is tolerating all her medications well. She continues to remain active, still riding horses and also tries to walk.  No symptoms of TIA or claudication.  I reviewed her Labs, LDL has risen from 60 to 97 by labs done by PCP 3 months ago.  Last year it was well controlled.  No change in medications and she has been compliant with Crestor.  I would like to recheck her lipids and if still elevated will add Zetia in view of significant carotid disease.  She is presently asymptomatic with regard to carotid stenosis, moderate to severe stenosis of the right carotid and moderate stenosis in the left carotid.  We will continue 6 monthly surveillance.  I would like to see her back in 6 months for follow-up.  Blood pressure  is well controlled.    JAdrian Prows MD, [facility]6/29/2022, 3:42 PM Office: [phone]: [phone]

## 2021-03-06 NOTE — Patient Instructions (Signed)
These have lab work done approximately 1 month from now at [facility].

## 2021-04-08 ENCOUNTER — Other Ambulatory Visit: Payer: Self-pay | Admitting: Cardiology

## 2021-05-14 DIAGNOSIS — F319 Bipolar disorder, unspecified: Secondary | ICD-10-CM | POA: Diagnosis not present

## 2021-05-14 DIAGNOSIS — E785 Hyperlipidemia, unspecified: Secondary | ICD-10-CM | POA: Diagnosis not present

## 2021-05-14 DIAGNOSIS — R32 Unspecified urinary incontinence: Secondary | ICD-10-CM | POA: Diagnosis not present

## 2021-05-14 DIAGNOSIS — G8929 Other chronic pain: Secondary | ICD-10-CM | POA: Diagnosis not present

## 2021-05-14 DIAGNOSIS — I25119 Atherosclerotic heart disease of native coronary artery with unspecified angina pectoris: Secondary | ICD-10-CM | POA: Diagnosis not present

## 2021-05-14 DIAGNOSIS — F419 Anxiety disorder, unspecified: Secondary | ICD-10-CM | POA: Diagnosis not present

## 2021-05-14 DIAGNOSIS — Z7982 Long term (current) use of aspirin: Secondary | ICD-10-CM | POA: Diagnosis not present

## 2021-05-14 DIAGNOSIS — K219 Gastro-esophageal reflux disease without esophagitis: Secondary | ICD-10-CM | POA: Diagnosis not present

## 2021-05-14 DIAGNOSIS — F4310 Post-traumatic stress disorder, unspecified: Secondary | ICD-10-CM | POA: Diagnosis not present

## 2021-06-19 ENCOUNTER — Other Ambulatory Visit: Payer: Self-pay | Admitting: Cardiology

## 2021-06-24 DIAGNOSIS — F3289 Other specified depressive episodes: Secondary | ICD-10-CM | POA: Diagnosis not present

## 2021-07-01 ENCOUNTER — Ambulatory Visit: Payer: Medicare PPO | Attending: Internal Medicine

## 2021-07-01 ENCOUNTER — Other Ambulatory Visit (HOSPITAL_BASED_OUTPATIENT_CLINIC_OR_DEPARTMENT_OTHER): Payer: Self-pay

## 2021-07-01 DIAGNOSIS — Z23 Encounter for immunization: Secondary | ICD-10-CM

## 2021-07-01 MED ORDER — PFIZER COVID-19 VAC BIVALENT 30 MCG/0.3ML IM SUSP
INTRAMUSCULAR | 0 refills | Status: DC
  Filled 2021-07-01: qty 0.3, 1d supply, fill #0

## 2021-07-01 NOTE — Progress Notes (Signed)
   Covid-19 Vaccination Clinic  Name:  Diane Richardson    MRN: [id number] DOB: 11/27/1950  07/01/2021  [name] was observed post Covid-19 immunization for 15 minutes without incident. She was provided with Vaccine Information Sheet and instruction to access the V-Safe system.   [name] was instructed to call [phone] with any severe reactions post vaccine: Difficulty breathing  Swelling of face and throat  A fast heartbeat  A bad rash all over body  Dizziness and weakness   Immunizations Administered     Name Date Dose VIS Date Route   Pfizer Covid-19 Vaccine Bivalent Booster 07/01/2021  1:09 PM 0.3 mL 05/08/2021 Intramuscular   Manufacturer: [employer], [employer]   Lot: [id number]   NDC: [phone]

## 2021-07-02 DIAGNOSIS — M21612 Bunion of left foot: Secondary | ICD-10-CM | POA: Diagnosis not present

## 2021-07-02 DIAGNOSIS — R21 Rash and other nonspecific skin eruption: Secondary | ICD-10-CM | POA: Diagnosis not present

## 2021-07-16 DIAGNOSIS — Z20822 Contact with and (suspected) exposure to covid-19: Secondary | ICD-10-CM | POA: Diagnosis not present

## 2021-07-18 DIAGNOSIS — U071 COVID-19: Secondary | ICD-10-CM | POA: Diagnosis not present

## 2021-08-20 ENCOUNTER — Ambulatory Visit: Payer: Medicare PPO

## 2021-08-20 ENCOUNTER — Other Ambulatory Visit: Payer: Self-pay

## 2021-08-20 DIAGNOSIS — I6523 Occlusion and stenosis of bilateral carotid arteries: Secondary | ICD-10-CM | POA: Diagnosis not present

## 2021-08-21 NOTE — Progress Notes (Signed)
Carotid artery duplex 08/20/2021: Duplex suggests stenosis in the right internal carotid artery (minimal). Duplex suggests stenosis in the right external carotid artery (>50%). Duplex suggests stenosis in the left internal carotid artery (16-49%). Duplex suggests stenosis in the left external carotid artery (<50%). Right vertebral artery flow is not visualized. Antegrade left vertebral artery flow. Compared to the study done on 01/22/2021, there is regression of stenosis in the bilateral carotid arteries. Follow up in one year is appropriate if clinically indicated.

## 2021-09-04 ENCOUNTER — Ambulatory Visit: Payer: Medicare PPO | Admitting: Cardiology

## 2021-09-04 ENCOUNTER — Other Ambulatory Visit: Payer: Self-pay

## 2021-09-04 ENCOUNTER — Encounter: Payer: Self-pay | Admitting: Cardiology

## 2021-09-04 VITALS — BP 120/58 | HR 61 | Temp 98.1°F | Resp 16 | Ht 61.0 in | Wt 127.4 lb

## 2021-09-04 DIAGNOSIS — I6523 Occlusion and stenosis of bilateral carotid arteries: Secondary | ICD-10-CM

## 2021-09-04 DIAGNOSIS — I25118 Atherosclerotic heart disease of native coronary artery with other forms of angina pectoris: Secondary | ICD-10-CM | POA: Diagnosis not present

## 2021-09-04 DIAGNOSIS — E7800 Pure hypercholesterolemia, unspecified: Secondary | ICD-10-CM | POA: Diagnosis not present

## 2021-09-04 NOTE — Progress Notes (Signed)
Primary Physician/Referring:  Carol Ada, MD  Patient ID: Diane Richardson, female    DOB: [birth year], 70 y.o.   MRN: [id number]  Chief Complaint  Patient presents with   Coronary Artery Disease   Follow-up   HPI:    Diane Richardson  is a 70 y.o. female  with history of fibromyalgia, depression, bipolar disorder, hyperlipidemia, hyperlipidemia, and CAD with stenting to OM1 in 2020 and bilateral carotid stenosis.  This is a 39-monthoffice visit, fortunately she has not had recurrence of angina pectoris.  She is tolerating all her medications well. She continues to remain active, still riding horses and also tries to walk.  Except for pain from her deformed feet, she has no specific complaints today.  Past Medical History:  Diagnosis Date   ADD (attention deficit disorder)    Anxiety    Coronary artery disease    Depression    DVT (deep venous thrombosis) (HCC)    Fatigue    chronic   Fibromyalgia    Hyperlipidemia    Muscle spasm    bad   PE (pulmonary embolism)    PTSD (post-traumatic stress disorder)    Past Surgical History:  Procedure Laterality Date   BUNIONECTOMY     CARDIAC CATHETERIZATION     CATARACT EXTRACTION  01/2020   Right eye   childbirth  1989   x1,NVD   CORONARY STENT INTERVENTION N/A 11/23/2018   Procedure: CORONARY STENT INTERVENTION;  Surgeon: GAdrian Prows MD;  [facility];  Service: Cardiovascular;  Laterality: N/A;  OM   FOOT SURGERY Right    pt states she had bunion surgery right foot.   HAND SURGERY     LEFT HEART CATH AND CORONARY ANGIOGRAPHY N/A 11/23/2018   Procedure: LEFT HEART CATH AND CORONARY ANGIOGRAPHY;  Surgeon: GAdrian Prows MD;  [facility];  Service: Cardiovascular;  Laterality: N/A;   PULMONARY EMBOLISM SURGERY  1983   x9 days   Family History  Problem Relation Age of Onset   Cancer Mother        ovarian    Peripheral vascular disease Mother 464  Hyperlipidemia Father    Hypertension Father     Multiple myeloma Father    CAD Father        CABG age 834  Heart disease Father        Pacer age 69  Diabetes Maternal Grandmother    Cancer Maternal Grandmother        stomach   Arthritis Brother    Dementia Maternal Grandfather    Colon polyps Maternal Grandfather    CAD Paternal Grandmother    Diabetes Paternal Grandmother    Dementia Paternal Grandfather     Social History   Tobacco Use   Smoking status: Never   Smokeless tobacco: Never  Substance Use Topics   Alcohol use: Yes    Comment: 7 glasses of wine weekly   Marital Status: Married  ROS  Review of Systems  Cardiovascular:  Negative for chest pain, claudication, dyspnea on exertion, leg swelling and syncope.  Gastrointestinal:  Negative for melena.  Objective  Blood pressure (!) 120/58, pulse 61, temperature 98.1 F (36.7 C), temperature source Temporal, resp. rate 16, height 5' 1"  (1.549 m), weight 127 lb 6.4 oz (57.8 kg), SpO2 99 %.  Vitals with BMI 09/04/2021 03/06/2021 08/15/2020  Height 5' 1"  5' 1"  5' 1"   Weight 127 lbs 6 oz 133 lbs 10 oz 136  lbs  BMI 24.08 31.54 00.86  Systolic 761 950 932  Diastolic 58 67 57  Pulse 61 65 60  Some encounter information is confidential and restricted. Go to Review Flowsheets activity to see all data.     Physical Exam Constitutional:      General: She is not in acute distress.    Appearance: She is well-developed.  Neck:     Vascular: Carotid bruit (Bilateral right greater than left) present. No JVD.  Cardiovascular:     Rate and Rhythm: Normal rate and regular rhythm.     Pulses: Intact distal pulses.     Heart sounds: No murmur heard.   No gallop.  Pulmonary:     Effort: Pulmonary effort is normal. No accessory muscle usage.     Breath sounds: Normal breath sounds.  Abdominal:     General: Bowel sounds are normal.     Palpations: Abdomen is soft.   Laboratory examination:   No results for input(s): NA, K, CL, CO2, GLUCOSE, BUN, CREATININE, CALCIUM,  GFRNONAA, GFRAA in the last 8760 hours.  CrCl cannot be calculated (Patient's most recent lab result is older than the maximum 21 days allowed.).  CMP Latest Ref Rng & Units 03/19/2020 01/27/2020 10/05/2019  Glucose 70 - 99 mg/dL 115(H) 98 88  BUN 8 - 23 mg/dL 21 21 17   Creatinine 0.44 - 1.00 mg/dL 0.92 0.96 0.87  Sodium 135 - 145 mmol/L 137 142 141  Potassium 3.5 - 5.1 mmol/L 4.0 4.9 5.0  Chloride 98 - 111 mmol/L 103 105 103  CO2 22 - 32 mmol/L 23 25 24   Calcium 8.9 - 10.3 mg/dL 9.4 9.5 9.9  Total Protein 6.0 - 8.5 g/dL - 6.3 6.9  Total Bilirubin 0.0 - 1.2 mg/dL - <0.2 0.3  Alkaline Phos 48 - 121 IU/L - 72 70  AST 0 - 40 IU/L - 23 42(H)  ALT 0 - 32 IU/L - 24 59(H)   CBC Latest Ref Rng & Units 03/19/2020 11/15/2018 03/01/2018  WBC 4.0 - 10.5 K/uL 7.3 5.2 4.2  Hemoglobin 12.0 - 15.0 g/dL 12.1 12.7 13.0  Hematocrit 36.0 - 46.0 % 37.3 36.5 38.4  Platelets 150 - 400 K/uL 264 247 244   Lipid Panel     Component Value Date/Time   CHOL 167 01/27/2020 1616   TRIG 98 01/27/2020 1616   HDL 79 01/27/2020 1616   CHOLHDL 2.1 01/27/2020 1616   LDLCALC 70 01/27/2020 1616   External labs:   12/05/2020:  Hb 13.0/HCT 39.7, platelets 222, normal indicis.  Serum glucose 92 mg, BUN 24, creatinine 0.88, EGFR 71 normal, potassium 4.0, CMP otherwise normal.  TSH normal at 2.44.  Total cholesterol 183, triglycerides 89, HDL 71, LDL 96, non-HDL cholesterol 112.  Medications and allergies   Allergies  Allergen Reactions   Penicillins Hives and Diarrhea    Did it involve swelling of the face/tongue/throat, SOB, or low BP? No Did it involve sudden or severe rash/hives, skin peeling, or any reaction on the inside of your mouth or nose? Yes Did you need to seek medical attention at a hospital or doctor's office? Yes When did it last happen?      childhood allergy If all above answers are "NO", may proceed with cephalosporin use.    Codeine Hives and Diarrhea   Donnatal [Belladonna Alk-Phenobarb  Er] Rash   Percocet [Oxycodone-Acetaminophen] Other (See Comments)    delusional    Current Outpatient Medications on File Prior to Visit  Medication Sig  Dispense Refill   aspirin 325 MG tablet Take 1 tablet by mouth daily.     bismuth subsalicylate (PEPTO BISMOL) 262 MG/15ML suspension Take 30 mLs by mouth every 6 (six) hours as needed for indigestion.     Cholecalciferol (VITAMIN D) 50 MCG (2000 UT) CAPS Take 2,000 Units by mouth daily.     Coenzyme Q10 (COQ-10) 200 MG CAPS Take 200 mg by mouth daily.     DULoxetine (CYMBALTA) 60 MG capsule Take 60 mg by mouth 2 (two) times daily.      LORazepam (ATIVAN) 0.5 MG tablet Take 1 tablet (0.5 mg total) by mouth 2 (two) times daily. (Patient taking differently: Take 0.5 mg by mouth See admin instructions. Take 0.5 mg in the morning, may take a second 0.5 mg dose as needed for anxiety) 30 tablet 0   Menthol-Zinc Oxide (GOLD BOND EX) Apply 1 application topically daily as needed (pain/itching).     metoprolol succinate (TOPROL-XL) 25 MG 24 hr tablet TAKE 1 TABLET BY MOUTH EVERY NIGHT AT BEDTIME 90 tablet 0   nitroGLYCERIN (NITROSTAT) 0.4 MG SL tablet Place 1 tablet (0.4 mg total) under the tongue every 5 (five) minutes as needed for chest pain. 25 tablet 3   omeprazole (PRILOSEC) 40 MG capsule Take 40 mg by mouth daily.     rosuvastatin (CRESTOR) 20 MG tablet TAKE 1 TABLET(20 MG) BY MOUTH DAILY 90 tablet 0   TURMERIC PO Take by mouth.     VRAYLAR capsule Take 1.5 mg by mouth daily.     No current facility-administered medications on file prior to visit.    Radiology:   CT Head Wo Contrast 12/05/2019: Chronic atrophic and ischemic changes without acute abnormality.  Cardiac Studies:   Exercise sestamibi stress test 10/25/2018: 1. The patient performed treadmill exercise using Bruce protocol, completing 5:03 minutes. The patient completed an estimated workload of 7 METS, reaching 86% of the maximum predicted heart rate. Exercise capacity was low.  Hemodynamic response was normal. Stress symptoms included exercise induced chest pain, relieved with rest.   2. The overall quality of the study is good. There is no evidence of abnormal lung activity. Stress and rest SPECT images demonstrate homogeneous tracer distribution throughout the myocardium. TID was mildly increased at 1.1. Gated SPECT imaging reveals normal myocardial thickening and wall motion. The left ventricular ejection fraction was normal (66%).   3. Intermediate risk study given symptoms and EKG changes. No SPECT evidence of ischemia or infarction see. Clinical correlation recommended.  Coronary angiogram 11/23/2018: Left dominant circulation, focal high-grade 99% stenosis in the very large OM1, otherwise tortuous LAD and circumflex coronary artery, no significant disease. S/P stenting of OM1 with 2.5 x 15 mm Orsiro DES at 11 atmospheric pressure giving a 2.75 mm lumen.  Stenosis reduced from 99% to 0% with maintenance of TIMI-3 to TIMI-3 flow.  Echocardiogram 03/20/2020: Left ventricle cavity is normal in size and wall thickness. Normal global wall motion. Normal LV systolic function with EF 60%. Doppler evidence of grade I (impaired) diastolic dysfunction, normal LAP.  Moderate (Grade II) mitral regurgitation. Normal right atrial pressure.  No significant change compared to previous study on 10/29/2018.   Carotid artery duplex 08/20/2021: Duplex suggests stenosis in the right internal carotid artery (minimal). Duplex suggests stenosis in the right external carotid artery (>50%). Duplex suggests stenosis in the left internal carotid artery (16-49%). Duplex suggests stenosis in the left external carotid artery (<50%). Right vertebral artery flow is not visualized. Antegrade left vertebral artery  flow. Compared to the study done on 01/22/2021, there is regression of stenosis in the bilateral carotid arteries. Follow up in one year is appropriate if clinically indicated.  EKG EKG  09/04/2021: Normal sinus rhythm at rate of 58 bpm, normal axis.  Incomplete right bundle branch block.  No evidence of ischemia, normal EKG. No significant change from 03/06/2021.   Assessment     ICD-10-CM   1. Atherosclerosis of native coronary artery of native heart with stable angina pectoris ([place])  I25.118 EKG 12-Lead    2. Asymptomatic carotid artery stenosis, bilateral  I65.23 US Carotid Bilateral    3. Pure hypercholesterolemia  E78.00 LDL cholesterol, direct    Lipid Panel With LDL/HDL Ratio       No orders of the defined types were placed in this encounter.    Medications Discontinued During This Encounter  Medication Reason   celecoxib (CELEBREX) 200 MG capsule    COVID-19 mRNA bivalent vaccine, Pfizer, (PFIZER COVID-19 VAC BIVALENT) injection    diclofenac sodium (VOLTAREN) 1 % GEL     Recommendations:   Diane Richardson  is a 70 y.o. female  with history of fibromyalgia, depression, bipolar disorder, hyperlipidemia, hyperlipidemia, and CAD with stenting to OM1 in 2020 and bilateral carotid stenosis.  This is a 69-monthoffice visit, fortunately she has not had recurrence of angina pectoris.  She is tolerating all her medications well. She continues to remain active, still riding horses and also tries to walk in spite of significant osteoarthritis.  She has significant deformity of her feet.  Vascular examination is normal, cardiac examination is normal and no change in EKG.  Previously I had placed orders for lipid profile and direct LDL as her LDL had risen.  She forgot to obtain these labs, she will obtain these today.  I reviewed the results of the carotid artery duplex.  Although the stenosis severity has improved bilaterally, I would like to confirm this by repeating the carotid artery duplex and an external facility.  To close the loop would like to see her back in 2 months.  Otherwise blood pressure is well controlled, her risk factors are well controlled.    JAdrian Prows MD, [facility]12/28/2022, 3:13 PM Office: [phone]: [phone]

## 2021-09-04 NOTE — Addendum Note (Signed)
Addended by: Delrae Rend on: 09/04/2021 03:22 PM   Modules accepted: Orders

## 2021-09-05 LAB — LIPID PANEL WITH LDL/HDL RATIO
Cholesterol, Total: 172 mg/dL (ref 100–199)
HDL: 71 mg/dL (ref >39)
LDL Chol Calc (NIH): 84 mg/dL (ref 0–99)
LDL/HDL Ratio: 1.2 ratio (ref 0.0–3.2)
Triglycerides: 93 mg/dL (ref 0–149)
VLDL Cholesterol Cal: 17 mg/dL (ref 5–40)

## 2021-09-05 LAB — LDL CHOLESTEROL, DIRECT: LDL Direct: 78 mg/dL (ref 0–99)

## 2021-09-05 NOTE — Progress Notes (Signed)
She is considering [facility]-ASCVD. Please call her tomorrow

## 2021-09-17 ENCOUNTER — Other Ambulatory Visit: Payer: Self-pay | Admitting: Cardiology

## 2021-10-08 ENCOUNTER — Other Ambulatory Visit: Payer: Self-pay | Admitting: Cardiology

## 2021-10-08 DIAGNOSIS — I25118 Atherosclerotic heart disease of native coronary artery with other forms of angina pectoris: Secondary | ICD-10-CM

## 2021-10-09 ENCOUNTER — Inpatient Hospital Stay: Admission: RE | Admit: 2021-10-09 | Payer: Medicare PPO | Source: Ambulatory Visit

## 2021-10-16 ENCOUNTER — Ambulatory Visit
Admission: RE | Admit: 2021-10-16 | Discharge: 2021-10-16 | Disposition: A | Discharge status: Home / Self Care | Payer: Medicare PPO | Source: Ambulatory Visit | Attending: Cardiology | Admitting: Cardiology

## 2021-10-16 ENCOUNTER — Other Ambulatory Visit: Payer: Self-pay

## 2021-10-16 DIAGNOSIS — I6523 Occlusion and stenosis of bilateral carotid arteries: Secondary | ICD-10-CM

## 2021-10-16 DIAGNOSIS — R0989 Other specified symptoms and signs involving the circulatory and respiratory systems: Secondary | ICD-10-CM | POA: Diagnosis not present

## 2021-10-16 DIAGNOSIS — E785 Hyperlipidemia, unspecified: Secondary | ICD-10-CM | POA: Diagnosis not present

## 2021-10-17 NOTE — Progress Notes (Signed)
Carotid artery duplex 10/16/2021: Bilateral carotid atherosclerosis. Negative for significant stenosis. Degree of narrowing less than 50% bilaterally by ultrasound criteria. Patent antegrade vertebral flow bilaterally Study correlates with the findings on 08/20/2021, previously noted high-grade stenosis in the right ICA and moderate stenosis in the left ICA on 01/22/2021 could not be confirmed.  Follow-up studies if clinically indicated.

## 2021-10-21 DIAGNOSIS — F330 Major depressive disorder, recurrent, mild: Secondary | ICD-10-CM | POA: Diagnosis not present

## 2021-11-08 ENCOUNTER — Ambulatory Visit: Payer: Medicare PPO | Admitting: Cardiology

## 2021-11-08 ENCOUNTER — Other Ambulatory Visit: Payer: Self-pay

## 2021-11-08 ENCOUNTER — Encounter: Payer: Self-pay | Admitting: Cardiology

## 2021-11-08 VITALS — BP 102/67 | HR 71 | Temp 98.4°F | Resp 17 | Ht 61.0 in | Wt 128.4 lb

## 2021-11-08 DIAGNOSIS — I25118 Atherosclerotic heart disease of native coronary artery with other forms of angina pectoris: Secondary | ICD-10-CM

## 2021-11-08 DIAGNOSIS — I6523 Occlusion and stenosis of bilateral carotid arteries: Secondary | ICD-10-CM | POA: Diagnosis not present

## 2021-11-08 DIAGNOSIS — E7800 Pure hypercholesterolemia, unspecified: Secondary | ICD-10-CM

## 2021-11-08 MED ORDER — EZETIMIBE 10 MG PO TABS: 10.0000 mg | ORAL_TABLET | Freq: Every day | ORAL | 3 refills | Status: DC

## 2021-11-08 NOTE — Progress Notes (Signed)
Primary Physician/Referring:  Carol Ada, MD  Patient ID: Diane Richardson, female    DOB: 03-May-1951, 71 y.o.   MRN: [id number]  Chief Complaint  Patient presents with   Follow-up    2 month   Hyperlipidemia   carotid artery stenosis   HPI:    Diane Richardson  is a 71 y.o. female  with history of fibromyalgia, depression, bipolar disorder, hyperlipidemia, hyperlipidemia, and CAD with stenting to OM1 in 2020 and bilateral carotid atherosclerosis but no significant stenosis.  Had seen her 2 months ago and there was a discrepancy in the carotid duplex performed previously and in December 2022 and so obtain repeat carotid duplex in external facility.  She now presents for follow-up.  She also presents for follow-up of hyperlipidemia and obtained labs.  She has not had recurrence of angina pectoris.  She is tolerating all her medications well. She continues to remain active, still riding horses and also tries to walk.  She had multiple questions regarding LPA, she was told to have elevated LPA in the past.  Past Medical History:  Diagnosis Date   ADD (attention deficit disorder)    Anxiety    Coronary artery disease    Depression    DVT (deep venous thrombosis) (HCC)    Fatigue    chronic   Fibromyalgia    Hyperlipidemia    Muscle spasm    bad   PE (pulmonary embolism)    PTSD (post-traumatic stress disorder)      Social History   Tobacco Use   Smoking status: Never   Smokeless tobacco: Never  Substance Use Topics   Alcohol use: Yes    Comment: 7 glasses of wine weekly   Marital Status: Married   ROS  Review of Systems  Cardiovascular:  Negative for chest pain, claudication, dyspnea on exertion, leg swelling and syncope.  Gastrointestinal:  Negative for melena.  Objective  Blood pressure 102/67, pulse 71, temperature 98.4 F (36.9 C), temperature source Temporal, resp. rate 17, height 5' 1"  (1.549 m), weight 128 lb 6.4 oz (58.2 kg), SpO2 98 %.  Vitals with BMI  11/08/2021 09/04/2021 03/06/2021  Height 5' 1"  5' 1"  5' 1"   Weight 128 lbs 6 oz 127 lbs 6 oz 133 lbs 10 oz  BMI 24.27 35.46 56.81  Systolic 275 170 017  Diastolic 67 58 67  Pulse 71 61 65  Some encounter information is confidential and restricted. Go to Review Flowsheets activity to see all data.     Physical Exam Constitutional:      General: She is not in acute distress.    Appearance: She is well-developed.  Neck:     Vascular: Carotid bruit (Bilateral right greater than left) present. No JVD.  Cardiovascular:     Rate and Rhythm: Normal rate and regular rhythm.     Pulses: Intact distal pulses.     Heart sounds: No murmur heard.   No gallop.  Pulmonary:     Effort: Pulmonary effort is normal. No accessory muscle usage.     Breath sounds: Normal breath sounds.  Abdominal:     General: Bowel sounds are normal.     Palpations: Abdomen is soft.   Laboratory examination:   Lipid Panel     Component Value Date/Time   CHOL 172 09/04/2021 1544   TRIG 93 09/04/2021 1544   HDL 71 09/04/2021 1544   CHOLHDL 2.1 01/27/2020 1616   LDLCALC 84 09/04/2021 1544   LDLDIRECT 78 09/04/2021 1544  External labs:   12/05/2020:  Hb 13.0/HCT 39.7, platelets 222, normal indicis.  Serum glucose 92 mg, BUN 24, creatinine 0.88, EGFR 71 normal, potassium 4.0, CMP otherwise normal.  TSH normal at 2.44.  Total cholesterol 183, triglycerides 89, HDL 71, LDL 96, non-HDL cholesterol 112.  Medications and allergies   Allergies  Allergen Reactions   Penicillins Hives and Diarrhea    Did it involve swelling of the face/tongue/throat, SOB, or low BP? No Did it involve sudden or severe rash/hives, skin peeling, or any reaction on the inside of your mouth or nose? Yes Did you need to seek medical attention at a hospital or doctor's office? Yes When did it last happen?      childhood allergy If all above answers are "NO", may proceed with cephalosporin use.    Codeine Hives and Diarrhea    Donnatal [Belladonna Alk-Phenobarb Er] Rash   Percocet [Oxycodone-Acetaminophen] Other (See Comments)    delusional     Current Outpatient Medications:    aspirin 325 MG tablet, Take 1 tablet by mouth daily., Disp: , Rfl:    bismuth subsalicylate (PEPTO BISMOL) 262 MG/15ML suspension, Take 30 mLs by mouth every 6 (six) hours as needed for indigestion., Disp: , Rfl:    Cholecalciferol (VITAMIN D) 50 MCG (2000 UT) CAPS, Take 2,000 Units by mouth daily., Disp: , Rfl:    Coenzyme Q10 (COQ-10) 200 MG CAPS, Take 200 mg by mouth daily., Disp: , Rfl:    DULoxetine (CYMBALTA) 60 MG capsule, Take 60 mg by mouth 2 (two) times daily. , Disp: , Rfl:    ezetimibe (ZETIA) 10 MG tablet, Take 1 tablet (10 mg total) by mouth daily., Disp: 100 tablet, Rfl: 3   LORazepam (ATIVAN) 0.5 MG tablet, Take 1 tablet (0.5 mg total) by mouth 2 (two) times daily. (Patient taking differently: Take 0.5 mg by mouth See admin instructions. Take 0.5 mg in the morning, may take a second 0.5 mg dose as needed for anxiety), Disp: 30 tablet, Rfl: 0   Magnesium 300 MG CAPS, 1 capsule with a meal, Disp: , Rfl:    Menthol-Zinc Oxide (GOLD BOND EX), Apply 1 application topically daily as needed (pain/itching)., Disp: , Rfl:    metoprolol succinate (TOPROL-XL) 25 MG 24 hr tablet, TAKE 1 TABLET BY MOUTH EVERY NIGHT AT BEDTIME, Disp: 90 tablet, Rfl: 0   nitroGLYCERIN (NITROSTAT) 0.4 MG SL tablet, DISSOLVE 1 TABLET UNDER THE TONGUE EVERY 5 MINS AS NEEDED FOR CHEST PAIN, Disp: 25 tablet, Rfl: 3   omeprazole (PRILOSEC) 40 MG capsule, Take 40 mg by mouth daily., Disp: , Rfl:    rosuvastatin (CRESTOR) 20 MG tablet, TAKE 1 TABLET(20 MG) BY MOUTH DAILY, Disp: 90 tablet, Rfl: 0   TURMERIC PO, Take by mouth., Disp: , Rfl:    VRAYLAR capsule, Take 1.5 mg by mouth daily., Disp: , Rfl:     Radiology:   CT Head Wo Contrast 12/05/2019: Chronic atrophic and ischemic changes without acute abnormality.  Cardiac Studies:   Exercise sestamibi stress  test 10/25/2018: 1. The patient performed treadmill exercise using Bruce protocol, completing 5:03 minutes. The patient completed an estimated workload of 7 METS, reaching 86% of the maximum predicted heart rate. Exercise capacity was low. Hemodynamic response was normal. Stress symptoms included exercise induced chest pain, relieved with rest.   2. The overall quality of the study is good. There is no evidence of abnormal lung activity. Stress and rest SPECT images demonstrate homogeneous tracer distribution throughout the myocardium. TID  was mildly increased at 1.1. Gated SPECT imaging reveals normal myocardial thickening and wall motion. The left ventricular ejection fraction was normal (66%).   3. Intermediate risk study given symptoms and EKG changes. No SPECT evidence of ischemia or infarction see. Clinical correlation recommended.  Coronary angiogram 11/23/2018: Left dominant circulation, focal high-grade 99% stenosis in the very large OM1, otherwise tortuous LAD and circumflex coronary artery, no significant disease. S/P stenting of OM1 with 2.5 x 15 mm Orsiro DES at 11 atmospheric pressure giving a 2.75 mm lumen.  Stenosis reduced from 99% to 0% with maintenance of TIMI-3 to TIMI-3 flow.  Echocardiogram 03/20/2020: Left ventricle cavity is normal in size and wall thickness. Normal global wall motion. Normal LV systolic function with EF 60%. Doppler evidence of grade I (impaired) diastolic dysfunction, normal LAP.  Moderate (Grade II) mitral regurgitation. Normal right atrial pressure.  No significant change compared to previous study on 10/29/2018.   Carotid artery duplex 10/16/2021: Bilateral carotid atherosclerosis. Negative for significant stenosis. Degree of narrowing less than 50% bilaterally by ultrasound criteria. Patent antegrade vertebral flow bilaterally Study correlates with the findings on 08/20/2021, previously noted high-grade stenosis in the right ICA and moderate stenosis in  the left ICA on 01/22/2021 could not be confirmed.  Follow-up studies if clinically indicated.  EKG EKG 09/04/2021: Normal sinus rhythm at rate of 58 bpm, normal axis.  Incomplete right bundle branch block.  No evidence of ischemia, normal EKG. No significant change from 03/06/2021.   Assessment     ICD-10-CM   1. Atherosclerosis of native coronary artery of native heart with stable angina pectoris ([place])  I25.118     2. Atherosclerosis of both carotid arteries  I65.23     3. Pure hypercholesterolemia  E78.00 ezetimibe (ZETIA) 10 MG tablet    Lipid Panel With LDL/HDL Ratio    Lipoprotein A (LPA)      Meds ordered this encounter  Medications   ezetimibe (ZETIA) 10 MG tablet    Sig: Take 1 tablet (10 mg total) by mouth daily.    Dispense:  100 tablet    Refill:  3     There are no discontinued medications.   Recommendations:   IZZABELLA BESSE  is a 71 y.o. ffemale  with history of fibromyalgia, depression, bipolar disorder, hyperlipidemia, hyperlipidemia, and CAD with stenting to OM1 in 2020 and bilateral carotid atherosclerosis but no significant stenosis.  Had seen her 2 months ago and there was a discrepancy in the carotid duplex performed previously and in December 2022 and so obtain repeat carotid duplex in external facility.  She now presents for follow-up.  She also presents for follow-up of hyperlipidemia and obtained labs.  I reviewed the results of the carotid artery duplex, very mild atherosclerosis noted.  With regard to hyperlipidemia, LDL is now reduced but still not at target in view of coronary artery disease, would recommend addition of Zetia.  She had questions regarding LPA, advised her that there is no direct therapy for LPA for now although PCSK9 inhibitors can reduce LPA, our goal would be to improve the LDL first.  She has already shown some evidence of regression of atherosclerosis in the carotid arteries by present medical therapy.  Addition of Zetia will certainly  make LDL very close to 55.  All questions answered.  She was very pleased with the evaluation.  Otherwise blood pressure is well controlled, her risk factors are well controlled.  I will see her back on an annual basis.  Adrian Prows, MD, [facility] 11/08/2021, 12:35 PM Office: [phone] Pager: [phone]

## 2021-12-04 DIAGNOSIS — M2042 Other hammer toe(s) (acquired), left foot: Secondary | ICD-10-CM | POA: Diagnosis not present

## 2021-12-04 DIAGNOSIS — M79672 Pain in left foot: Secondary | ICD-10-CM | POA: Diagnosis not present

## 2021-12-04 DIAGNOSIS — M21612 Bunion of left foot: Secondary | ICD-10-CM | POA: Diagnosis not present

## 2021-12-04 DIAGNOSIS — M79671 Pain in right foot: Secondary | ICD-10-CM | POA: Diagnosis not present

## 2021-12-17 ENCOUNTER — Other Ambulatory Visit: Payer: Self-pay | Admitting: Cardiology

## 2021-12-18 DIAGNOSIS — F330 Major depressive disorder, recurrent, mild: Secondary | ICD-10-CM | POA: Diagnosis not present

## 2021-12-18 DIAGNOSIS — F419 Anxiety disorder, unspecified: Secondary | ICD-10-CM | POA: Diagnosis not present

## 2021-12-24 DIAGNOSIS — D123 Benign neoplasm of transverse colon: Secondary | ICD-10-CM | POA: Diagnosis not present

## 2021-12-24 DIAGNOSIS — D124 Benign neoplasm of descending colon: Secondary | ICD-10-CM | POA: Diagnosis not present

## 2021-12-24 DIAGNOSIS — K6389 Other specified diseases of intestine: Secondary | ICD-10-CM | POA: Diagnosis not present

## 2021-12-24 DIAGNOSIS — K648 Other hemorrhoids: Secondary | ICD-10-CM | POA: Diagnosis not present

## 2021-12-24 DIAGNOSIS — Z1211 Encounter for screening for malignant neoplasm of colon: Secondary | ICD-10-CM | POA: Diagnosis not present

## 2021-12-27 DIAGNOSIS — D124 Benign neoplasm of descending colon: Secondary | ICD-10-CM | POA: Diagnosis not present

## 2021-12-27 DIAGNOSIS — D123 Benign neoplasm of transverse colon: Secondary | ICD-10-CM | POA: Diagnosis not present

## 2022-01-03 ENCOUNTER — Other Ambulatory Visit: Payer: Self-pay | Admitting: Cardiology

## 2022-01-15 DIAGNOSIS — L84 Corns and callosities: Secondary | ICD-10-CM | POA: Diagnosis not present

## 2022-01-15 DIAGNOSIS — M2022 Hallux rigidus, left foot: Secondary | ICD-10-CM | POA: Diagnosis not present

## 2022-01-15 DIAGNOSIS — M21612 Bunion of left foot: Secondary | ICD-10-CM | POA: Diagnosis not present

## 2022-01-15 DIAGNOSIS — M2042 Other hammer toe(s) (acquired), left foot: Secondary | ICD-10-CM | POA: Diagnosis not present

## 2022-01-15 DIAGNOSIS — M7742 Metatarsalgia, left foot: Secondary | ICD-10-CM | POA: Diagnosis not present

## 2022-01-15 DIAGNOSIS — M79672 Pain in left foot: Secondary | ICD-10-CM | POA: Diagnosis not present

## 2022-01-22 DIAGNOSIS — F330 Major depressive disorder, recurrent, mild: Secondary | ICD-10-CM | POA: Diagnosis not present

## 2022-01-22 DIAGNOSIS — F419 Anxiety disorder, unspecified: Secondary | ICD-10-CM | POA: Diagnosis not present

## 2022-02-18 DIAGNOSIS — F0632 Mood disorder due to known physiological condition with major depressive-like episode: Secondary | ICD-10-CM | POA: Diagnosis not present

## 2022-02-18 DIAGNOSIS — F330 Major depressive disorder, recurrent, mild: Secondary | ICD-10-CM | POA: Diagnosis not present

## 2022-02-20 DIAGNOSIS — M2042 Other hammer toe(s) (acquired), left foot: Secondary | ICD-10-CM | POA: Diagnosis not present

## 2022-02-20 DIAGNOSIS — M2022 Hallux rigidus, left foot: Secondary | ICD-10-CM | POA: Diagnosis not present

## 2022-02-20 DIAGNOSIS — M21612 Bunion of left foot: Secondary | ICD-10-CM | POA: Diagnosis not present

## 2022-02-20 DIAGNOSIS — L84 Corns and callosities: Secondary | ICD-10-CM | POA: Diagnosis not present

## 2022-03-12 DIAGNOSIS — F330 Major depressive disorder, recurrent, mild: Secondary | ICD-10-CM | POA: Diagnosis not present

## 2022-03-12 DIAGNOSIS — F419 Anxiety disorder, unspecified: Secondary | ICD-10-CM | POA: Diagnosis not present

## 2022-03-21 DIAGNOSIS — F319 Bipolar disorder, unspecified: Secondary | ICD-10-CM | POA: Diagnosis not present

## 2022-03-21 DIAGNOSIS — K219 Gastro-esophageal reflux disease without esophagitis: Secondary | ICD-10-CM | POA: Diagnosis not present

## 2022-03-21 DIAGNOSIS — R0989 Other specified symptoms and signs involving the circulatory and respiratory systems: Secondary | ICD-10-CM | POA: Diagnosis not present

## 2022-03-21 DIAGNOSIS — E559 Vitamin D deficiency, unspecified: Secondary | ICD-10-CM | POA: Diagnosis not present

## 2022-03-21 DIAGNOSIS — E785 Hyperlipidemia, unspecified: Secondary | ICD-10-CM | POA: Diagnosis not present

## 2022-03-21 DIAGNOSIS — Z23 Encounter for immunization: Secondary | ICD-10-CM | POA: Diagnosis not present

## 2022-03-21 DIAGNOSIS — Z0000 Encounter for general adult medical examination without abnormal findings: Secondary | ICD-10-CM | POA: Diagnosis not present

## 2022-03-21 DIAGNOSIS — E2839 Other primary ovarian failure: Secondary | ICD-10-CM | POA: Diagnosis not present

## 2022-03-21 DIAGNOSIS — I2510 Atherosclerotic heart disease of native coronary artery without angina pectoris: Secondary | ICD-10-CM | POA: Diagnosis not present

## 2022-03-24 DIAGNOSIS — M2022 Hallux rigidus, left foot: Secondary | ICD-10-CM | POA: Diagnosis not present

## 2022-03-24 DIAGNOSIS — M21612 Bunion of left foot: Secondary | ICD-10-CM | POA: Diagnosis not present

## 2022-03-24 DIAGNOSIS — M2042 Other hammer toe(s) (acquired), left foot: Secondary | ICD-10-CM | POA: Diagnosis not present

## 2022-03-28 ENCOUNTER — Encounter: Payer: Self-pay | Admitting: Cardiology

## 2022-03-28 ENCOUNTER — Encounter: Payer: Self-pay | Admitting: Speech Pathology

## 2022-03-28 ENCOUNTER — Ambulatory Visit: Payer: Medicare PPO | Attending: Family Medicine | Admitting: Speech Pathology

## 2022-03-28 DIAGNOSIS — R1310 Dysphagia, unspecified: Secondary | ICD-10-CM | POA: Insufficient documentation

## 2022-03-28 NOTE — Therapy (Signed)
OUTPATIENT SPEECH LANGUAGE PATHOLOGY SWALLOW EVALUATION   Patient Name: Diane Richardson MRN: [id number] DOB:03-Oct-1950, 71 y.o., female Today's Date: 03/28/2022  PCP: Merri Brunette, MD REFERRING PROVIDER: Wilfrid Lund, PA   End of Session - 03/28/22 1435     Visit Number 1    Number of Visits 7    Date for SLP Re-Evaluation 05/09/22    Authorization Type [employer]    Progress Note Due on Visit 10    SLP Start Time 1230    SLP Stop Time  1317    SLP Time Calculation (min) 47 min    Activity Tolerance Patient tolerated treatment well             Past Medical History:  Diagnosis Date   ADD (attention deficit disorder)    Anxiety    Coronary artery disease    Depression    DVT (deep venous thrombosis) (HCC)    Fatigue    chronic   Fibromyalgia    Hyperlipidemia    Muscle spasm    bad   PE (pulmonary embolism)    PTSD (post-traumatic stress disorder)    Past Surgical History:  Procedure Laterality Date   BUNIONECTOMY     CARDIAC CATHETERIZATION     CATARACT EXTRACTION  01/2020   Right eye   childbirth  1989   x1,NVD   CORONARY STENT INTERVENTION N/A 11/23/2018   Procedure: CORONARY STENT INTERVENTION;  Surgeon: Yates Decamp, MD;  Location: [facility];  Service: Cardiovascular;  Laterality: N/A;  OM   FOOT SURGERY Right    pt states she had bunion surgery right foot.   HAND SURGERY     LEFT HEART CATH AND CORONARY ANGIOGRAPHY N/A 11/23/2018   Procedure: LEFT HEART CATH AND CORONARY ANGIOGRAPHY;  Surgeon: Yates Decamp, MD;  Location: [facility];  Service: Cardiovascular;  Laterality: N/A;   PULMONARY EMBOLISM SURGERY  1983   x9 days   Patient Active Problem List   Diagnosis Date Noted   Atherosclerosis of native coronary artery of native heart without angina pectoris 12/07/2018   Asymptomatic carotid artery stenosis, bilateral 11/16/2018   Dyslipidemia 11/16/2018   Angina pectoris (HCC) 11/14/2018   Abnormal nuclear stress test 11/14/2018    Mixed hyperlipidemia 11/14/2018   Bilateral carotid bruits 11/12/2018   Fibromyalgia 02/27/2017   Primary insomnia 02/27/2017   Chronic midline low back pain with left-sided sciatica 02/27/2017   Long term current use of non-steroidal anti-inflammatories (NSAID) 02/27/2017   History of psychotic reaction/ history of OCD / history of depression  02/27/2017   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) 01/17/2015   Rigors 01/17/2015   Multifocal myoclonus 01/17/2015   Progressive dementia with uncertain etiology (HCC) 01/17/2015   OCD (obsessive compulsive disorder) 10/16/2014   Delusional disorder (HCC) 10/16/2014   Mild neurocognitive disorder 10/10/2014   Mild benzodiazepine use disorder (HCC) 10/10/2014   Anxiety disorder 10/10/2014   Anxiety 08/29/2014   H/O deep venous thrombosis 08/29/2014   Healed or old pulmonary embolism 08/29/2014   Paranoia (HCC)     ONSET DATE: referral date 03/25/2022  REFERRING DIAG: R09.89 (ICD-10-CM) - Choking episode  THERAPY DIAG:  Dysphagia, unspecified type  Rationale for Evaluation and Treatment Rehabilitation  SUBJECTIVE:   SUBJECTIVE STATEMENT: "I have been having trouble for a while" Pt accompanied by: significant other and family member  PERTINENT HISTORY: Pt with previously documented dysphagia (2016). Currently experiencing difficulties with solid PO, in which  she required heimlich x1 and 5-6x of requiring pat on back to assist in anterior propulsion of bolus out of pharynx. 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  PAIN:  Are you having pain? No and denies currently but reports would have pain if began hiccuping  FALLS: Has patient fallen in last 6 months?  Yes, Number of falls: 1  (fell off horse)   LIVING ENVIRONMENT: Lives with: lives with their spouse Lives in: House/apartment  PLOF:  Level of assistance: Independent with IADLs Employment: Retired   PATIENT  GOALS "eat safely"   OBJECTIVE:   RECOMMENDATIONS FROM OBJECTIVE SWALLOW STUDY (MBSS/FEES):  MBSS in 2016 notes "moderate oropharyngeal dysphagia with appearance of neuromuscular, sensorimotor deficits. Swallow initiation is delayed with large boluses of thin liquids being silently aspirated before the swallow. Thre is also generalized weakness of the swallow mechanism with vallecular and pyriform sinus residuals lingering post swallow. Heavier boluses seem to increase epiglottic deflection and UES opening and a second swallow helps clearance of residual. The pt is at risk of silent aspiration with large sips of thin liquids. If the can moderate bolus size and remains relatively healthy from a pulmonay standpoint, would recommend continuing thin liquids and attempt more advanced solids as pt is able to masticate and transit solid boluses."  COGNITION: Overall cognitive status: Impaired Areas of impairment:  Memory: Impaired: Short term Executive function: Impaired: Impulse control and Error awareness Functional deficits: per daughter's report, reporting largely centered around need for verbal cueing during meal times to slow rate, rest between bites when having difficulties passing solids.   ORAL MOTOR EXAMINATION Overall status: WFL Comments: unremarkable   CLINICAL SWALLOW ASSESSMENT:   Current diet: regular Dentition: adequate natural dentition Patient directly observed with POs: Yes: regular, thin liquids, and mixed consistancies  Feeding: able to feed self Liquids provided by: cup and sequential sips Oral phase signs and symptoms:  none demonstrated Pharyngeal phase signs and symptoms: complaints of residue and complaints of globus    PATIENT REPORTED OUTCOME MEASURES (PROM): To be administered first therapy session d/t time constraints    TODAY'S TREATMENT:  SLP provides education on swallow physiology and impact of pharyngeal and esophageal functioning on swallow. SLP provides  swallow compensations and recommendations for supervision with PO intake to aid in safety when eating d/t recent choking event. Pt and family members verbalize understanding. All questions answered to satisfaction. Education and discussion on POC and goals, pt in agreement with plan.    PATIENT EDUCATION: Education details: see above Person educated: Patient, Spouse, and Child(ren) Education method: Explanation, Demonstration, and Handouts Education comprehension: verbalized understanding, returned demonstration, and needs further education   ASSESSMENT:  CLINICAL IMPRESSION: Patient is a 71 y.o. F who was seen today for dysphagia following choking event which required heimlich maneuver. Additional c/o food "sticking" in throat (points to pharyngeal and proximal esophageal areas). Daughter reports 5-6 episodes of needing "pat of back" to dislodge food from throat, prior to previously mentioned choking incident. Denies difficulties with liquids, endorses frequent coughing and throat clearing with solids, endorses some intermittent odynophagia. Is no avoiding food at this time. Difficulties have persisted for "a few years but gotten worse over past year." Based on case history, oral mech exam, and observation PO boluses, I recommend objective swallow eval (MBSS) to assess pt's pharyngeal swallow function, efficacy, and safety. Will utilize results to formulate appropriate plan of care, in addition to compensations trained today, to improve swallow function. I also recommend consideration for GI  consultation to rule of esophageal impact on overall swallow function.   OBJECTIVE IMPAIRMENTS include dysphagia. These impairments are limiting patient from safety when swallowing. Factors affecting potential to achieve goals and functional outcome are  n/a . Patient will benefit from skilled SLP services to address above impairments and improve overall function.  REHAB POTENTIAL: Good   GOALS: Goals  reviewed with patient? Yes  SHORT TERM GOALS: Target date: 04/25/22  Pt will teach back trained swallow compensations and strategies to aid in successful oral intake with rare min-A.  Baseline: Goal status: INITIAL  2.  Pt will demonstrate swallow compensations with no more than 3 verbal cues over 10 minute period, with mod-!.  Baseline:  Goal status: INITIAL  3.  Pt will complete EAT-10 in first therapy visit.  Baseline:  Goal status: INITIAL  4.  Pt will complete MBSS to objectively assess swallow.  Baseline:  Goal status: INITIAL   LONG TERM GOALS: Target date: 05/09/2022  Pt and family members will report ongoing compliance with recommended swallow compensations and strategies, with no occurences of choking, over 1 week period, with mod-I  Baseline:  Goal status: INITIAL  2.  Pt will report adherence with prescribed dysphagia exercises, if indicated per MBSS, at recommended frequency with mod-I, over 1 week period. Baseline:  Goal status: INITIAL  3.  Pt will report subjective improvement in swallow function per EAT-10 PROM by d/c.  Baseline:  Goal status: INITIAL  PLAN: SLP FREQUENCY: 1x/week  SLP DURATION: 6 weeks  PLANNED INTERVENTIONS: Aspiration precaution training, Pharyngeal strengthening exercises, Diet toleration management , Trials of upgraded texture/liquids, SLP instruction and feedback, Compensatory strategies, Patient/family education, and Re-evaluation  Maia Breslow, CCC-SLP 03/28/2022, 2:36 PM

## 2022-03-28 NOTE — Patient Instructions (Signed)
Swallow Recommendations:  Finely chop all food, especially those which have proven to be difficult (leafy greens, hard vegetables, meats) Moisten food  Take small bites and follow with liquid wash Take your time when eating Stay upright 30 minutes following eating   I recommend a Modified Barium Swallow Study (MBSS) and a GI consult (with potential additional Barium Swallow study) based on you reports. I will order the MBSS, they will call you to schedule.   I will recommend GI consultation to referring provider, you may need to contact her to have that completed.

## 2022-03-31 ENCOUNTER — Other Ambulatory Visit (HOSPITAL_COMMUNITY): Payer: Self-pay | Admitting: *Deleted

## 2022-03-31 DIAGNOSIS — R1310 Dysphagia, unspecified: Secondary | ICD-10-CM

## 2022-04-01 DIAGNOSIS — G8929 Other chronic pain: Secondary | ICD-10-CM | POA: Diagnosis not present

## 2022-04-01 DIAGNOSIS — I10 Essential (primary) hypertension: Secondary | ICD-10-CM | POA: Diagnosis not present

## 2022-04-01 DIAGNOSIS — F324 Major depressive disorder, single episode, in partial remission: Secondary | ICD-10-CM | POA: Diagnosis not present

## 2022-04-01 DIAGNOSIS — I2510 Atherosclerotic heart disease of native coronary artery without angina pectoris: Secondary | ICD-10-CM | POA: Diagnosis not present

## 2022-04-01 DIAGNOSIS — F419 Anxiety disorder, unspecified: Secondary | ICD-10-CM | POA: Diagnosis not present

## 2022-04-01 DIAGNOSIS — E785 Hyperlipidemia, unspecified: Secondary | ICD-10-CM | POA: Diagnosis not present

## 2022-04-01 DIAGNOSIS — K219 Gastro-esophageal reflux disease without esophagitis: Secondary | ICD-10-CM | POA: Diagnosis not present

## 2022-04-01 DIAGNOSIS — Z7982 Long term (current) use of aspirin: Secondary | ICD-10-CM | POA: Diagnosis not present

## 2022-04-01 DIAGNOSIS — R32 Unspecified urinary incontinence: Secondary | ICD-10-CM | POA: Diagnosis not present

## 2022-04-03 ENCOUNTER — Other Ambulatory Visit: Payer: Self-pay | Admitting: Cardiology

## 2022-04-07 DIAGNOSIS — F330 Major depressive disorder, recurrent, mild: Secondary | ICD-10-CM | POA: Diagnosis not present

## 2022-04-07 DIAGNOSIS — F419 Anxiety disorder, unspecified: Secondary | ICD-10-CM | POA: Diagnosis not present

## 2022-04-08 ENCOUNTER — Ambulatory Visit (HOSPITAL_COMMUNITY)
Admission: RE | Admit: 2022-04-08 | Discharge: 2022-04-08 | Disposition: A | Discharge status: Home / Self Care | Payer: Medicare PPO | Source: Ambulatory Visit | Attending: Family Medicine | Admitting: Family Medicine

## 2022-04-08 DIAGNOSIS — R1310 Dysphagia, unspecified: Secondary | ICD-10-CM | POA: Diagnosis not present

## 2022-04-08 DIAGNOSIS — R1312 Dysphagia, oropharyngeal phase: Secondary | ICD-10-CM | POA: Insufficient documentation

## 2022-04-08 NOTE — Therapy (Signed)
Modified Barium Swallow Progress Note  Patient Details  Name: Diane Richardson MRN: [id number] Date of Birth: October 20, 1950  Today's Date: 04/08/2022  Modified Barium Swallow completed.  Full report located under Chart Review in the Imaging Section.  Brief recommendations include the following:  Clinical Impression  Patient presents with a mild oropharyngeal dysphagia as per this MBS. During oral phase she exhibited piecemeal swallowing with solids and liquids, premature spillage of liquids into vallecular sinus. During pharyngeal phase, she exhibited swallow initiation delays at level of pyriform sinus with thin liquids and delays at level of vallecular sinus with puree and regular texture solids. Only trace to mild vallecular residuals observed post initial swallows and with full clearance with subsequent swallows. She did exhibit one instance of trace silent aspiration just below vocal cords which occured when she was taking successive straw sips of thin liquids. Cervical esophageal phase appeared [facility]. During esophageal sweep, distal retrograde movement of barium observed. SLP provided patient with information on soft solid diets (IDDSI diets level 6 and 7) for her reference and encouraged her to discuss further when she has next OP SLP appointment.   Swallow Evaluation Recommendations   Recommended Consults: Other (Comment) (continue OP SLP)   SLP Diet Recommendations: Dysphagia 3 (Mech soft) solids;Thin liquid   Liquid Administration via: Cup;No straw   Medication Administration: Whole meds with liquid       Compensations: Small sips/bites   Postural Changes: Seated upright at 90 degrees           Angela Nevin, MA, CCC-SLP Speech Therapy

## 2022-04-09 ENCOUNTER — Other Ambulatory Visit: Payer: Self-pay | Admitting: Cardiology

## 2022-04-10 ENCOUNTER — Other Ambulatory Visit: Payer: Self-pay | Admitting: Physician Assistant

## 2022-04-10 DIAGNOSIS — R1310 Dysphagia, unspecified: Secondary | ICD-10-CM | POA: Diagnosis not present

## 2022-04-10 DIAGNOSIS — K219 Gastro-esophageal reflux disease without esophagitis: Secondary | ICD-10-CM | POA: Diagnosis not present

## 2022-04-10 DIAGNOSIS — F330 Major depressive disorder, recurrent, mild: Secondary | ICD-10-CM | POA: Diagnosis not present

## 2022-04-10 DIAGNOSIS — F0632 Mood disorder due to known physiological condition with major depressive-like episode: Secondary | ICD-10-CM | POA: Diagnosis not present

## 2022-04-16 ENCOUNTER — Ambulatory Visit
Admission: RE | Admit: 2022-04-16 | Discharge: 2022-04-16 | Disposition: A | Discharge status: Home / Self Care | Payer: Medicare PPO | Source: Ambulatory Visit | Attending: Physician Assistant | Admitting: Physician Assistant

## 2022-04-16 ENCOUNTER — Ambulatory Visit: Payer: Medicare PPO | Attending: Family Medicine | Admitting: Speech Pathology

## 2022-04-16 DIAGNOSIS — R1310 Dysphagia, unspecified: Secondary | ICD-10-CM

## 2022-04-16 NOTE — Patient Instructions (Signed)
Recommendations:   Small bites Chew food thoroughly- more than you think you need to Avoid eating challenging foods in distracting environments  Cut food while you're eating to make you put on the breaks Cut into smaller bite sized pieces Take sips of liquid throughout your meal to help moisten your mouth   Practicing mindfulness with eating:  Remember is that this is challenging, it can be hard to change habits  Focus on your eating while you're doing it- taking those small bites and chewing thoroughly  Make food selection choices based on your ability to focus on your eating Don't take next bite until you've swallowed prior bite   IDSSI 6 & 7 are ideas for easier to chew foods. You do not need to restrict your diet to these foods. However, I would make sure you can be mindful about eating when having more challenging foods like steak, nuts, carrots, etc.

## 2022-04-16 NOTE — Therapy (Signed)
OUTPATIENT SPEECH LANGUAGE PATHOLOGY SWALLOW TREATMENT (DISCHARGE)   Patient Name: Diane Richardson MRN: [id number] DOB:06/15/1951, 71 y.o., female Today's Date: 04/16/2022  PCP: Carol Ada, MD REFERRING PROVIDER: Lois Huxley, PA   End of Session - 04/16/22 1316     Visit Number 2    Number of Visits 7    Date for SLP Re-Evaluation 05/09/22    Authorization Type [employer]    Progress Note Due on Visit 10    SLP Start Time 1316    SLP Stop Time  1347    SLP Time Calculation (min) 31 min    Activity Tolerance Patient tolerated treatment well              Past Medical History:  Diagnosis Date   ADD (attention deficit disorder)    Anxiety    Coronary artery disease    Depression    DVT (deep venous thrombosis) (HCC)    Fatigue    chronic   Fibromyalgia    Hyperlipidemia    Muscle spasm    bad   PE (pulmonary embolism)    PTSD (post-traumatic stress disorder)    Past Surgical History:  Procedure Laterality Date   BUNIONECTOMY     CARDIAC CATHETERIZATION     CATARACT EXTRACTION  01/2020   Right eye   childbirth  1989   x1,NVD   CORONARY STENT INTERVENTION N/A 11/23/2018   Procedure: CORONARY STENT INTERVENTION;  Surgeon: Adrian Prows, MD;  Location: [place] [facility];  Service: Cardiovascular;  Laterality: N/A;  OM   FOOT SURGERY Right    pt states she had bunion surgery right foot.   HAND SURGERY     LEFT HEART CATH AND CORONARY ANGIOGRAPHY N/A 11/23/2018   Procedure: LEFT HEART CATH AND CORONARY ANGIOGRAPHY;  Surgeon: Adrian Prows, MD;  Location: [place] [facility];  Service: Cardiovascular;  Laterality: N/A;   PULMONARY EMBOLISM SURGERY  1983   x9 days   Patient Active Problem List   Diagnosis Date Noted   Atherosclerosis of native coronary artery of native heart without angina pectoris 12/07/2018   Asymptomatic carotid artery stenosis, bilateral 11/16/2018   Dyslipidemia 11/16/2018   Angina pectoris ([place]) 11/14/2018   Abnormal nuclear stress  test 11/14/2018   Mixed hyperlipidemia 11/14/2018   Bilateral carotid bruits 11/12/2018   Fibromyalgia 02/27/2017   Primary insomnia 02/27/2017   Chronic midline low back pain with left-sided sciatica 02/27/2017   Long term current use of non-steroidal anti-inflammatories (NSAID) 02/27/2017   History of psychotic reaction/ history of OCD / history of depression  02/27/2017   Other psychotic disorder not due to substance or known physiological condition ([place]) 01/17/2015   Rigors 01/17/2015   Multifocal myoclonus 01/17/2015   Progressive dementia with uncertain etiology ([place]) 01/17/2015   OCD (obsessive compulsive disorder) 10/16/2014   Delusional disorder ([place]) 10/16/2014   Mild neurocognitive disorder 10/10/2014   Mild benzodiazepine use disorder ([place]) 10/10/2014   Anxiety disorder 10/10/2014   Anxiety 08/29/2014   H/O deep venous thrombosis 08/29/2014   Healed or old pulmonary embolism 08/29/2014   Paranoia ([place])     ONSET DATE: referral date 03/25/2022  REFERRING DIAG: R09.89 (ICD-10-CM) - Choking episode  THERAPY DIAG:  Dysphagia, unspecified type  Rationale for Evaluation and Treatment Rehabilitation  SUBJECTIVE:   SUBJECTIVE STATEMENT: "I got that test done" Pt accompanied by: significant other  PAIN:  Are you having pain? No  SPEECH THERAPY DISCHARGE SUMMARY  Visits from Start of Care: 2  Current functional level related to goals / functional outcomes: Understanding demonstrated of swallow compensations and strategies to aid in safe PO intake   Remaining deficits: Mild oropharyngeal dysphagia   Education / Equipment: Swallow anatomy/physiology, swallow compensations/strategies, MBSS results    Patient agrees to discharge. Patient goals were partially met. Patient is being discharged due to maximized rehab potential.    OBJECTIVE:   RECOMMENDATIONS FROM OBJECTIVE SWALLOW STUDY (MBSS/FEES): Patient presents with a mild oropharyngeal dysphagia as per this  MBS. During oral phase she exhibited piecemeal swallowing with solids and liquids, premature spillage of liquids into vallecular sinus. During pharyngeal phase, she exhibited swallow initiation delays at level of pyriform sinus with thin liquids and delays at level of vallecular sinus with puree and regular texture solids. Only trace to mild vallecular residuals observed post initial swallows and with full clearance with subsequent swallows. She did exhibit one instance of trace silent aspiration just below vocal cords which occured when she was taking successive straw sips of thin liquids. Cervical esophageal phase appeared [facility]. During esophageal sweep, distal retrograde movement of barium observed.    TODAY'S TREATMENT:  04-16-22: Reviewed MBSS results, see above. SLP provided extensive education on swallow compensations and strategies to maximize swallow function and safety. Explanations of all recommendations in which pt able to teach back with rare min-A. See instructions. Pt and husband verbalize understanding and all questions are answered to pt's satisfaction. Pt agreeable to d/c, satisfied with education and believes self able to implement at home with supervision from husband and daughter.   03-28-22: SLP provides education on swallow physiology and impact of pharyngeal and esophageal functioning on swallow. SLP provides swallow compensations and recommendations for supervision with PO intake to aid in safety when eating d/t recent choking event. Pt and family members verbalize understanding. All questions answered to satisfaction. Education and discussion on POC and goals, pt in agreement with plan.    PATIENT EDUCATION: Education details: see above Person educated: Patient, Spouse, and Child(ren) Education method: Explanation, Demonstration, and Handouts Education comprehension: verbalized understanding, returned demonstration, and needs further education   ASSESSMENT:  CLINICAL  IMPRESSION: Patient is a 71 y.o. F with mild oropharyngeal dysphagia likely 2/2 incomplete mastication and increased rate while eating. SLP educated pt on strategies and compensations to aid in improved safety with PO intake and to limit future choking events. Pt and spouse verbalize understanding of all material presented and report they'll be able to implement at home based on education provided. Agreeable to d/c and will continue to f/u with GI per scheduled procedures.   OBJECTIVE IMPAIRMENTS include dysphagia. These impairments are limiting patient from safety when swallowing. Factors affecting potential to achieve goals and functional outcome are  n/a . Patient will benefit from skilled SLP services to address above impairments and improve overall function.  REHAB POTENTIAL: Good   GOALS: Goals reviewed with patient? Yes  SHORT TERM GOALS: Target date: 04/25/22  Pt will teach back trained swallow compensations and strategies to aid in successful oral intake with rare min-A.  Baseline: 04-16-22 Goal status: MET  2.  Pt will demonstrate swallow compensations with no more than 3 verbal cues over 10 minute period, with mod-!.  Baseline:  Goal status: NOT MET  3.  Pt will complete EAT-10 in first therapy visit.  Baseline:  Goal status: REVISED  4.  Pt will complete MBSS to objectively assess swallow.  Baseline: 04-16-22 Goal status: MET   LONG TERM GOALS: Target date: 05/09/2022  Pt and  family members will report ongoing compliance with recommended swallow compensations and strategies, with no occurences of choking, over 1 week period, with mod-I  Baseline:  Goal status: NOT MET  2.  Pt will report adherence with prescribed dysphagia exercises, if indicated per MBSS, at recommended frequency with mod-I, over 1 week period. Baseline:  Goal status: NOT MET  3.  Pt will report subjective improvement in swallow function per EAT-10 PROM by d/c.  Baseline:  Goal status: NOT  MET  PLAN: SLP FREQUENCY: 1x/week  SLP DURATION: 6 weeks  PLANNED INTERVENTIONS: Aspiration precaution training, Pharyngeal strengthening exercises, Diet toleration management , Trials of upgraded texture/liquids, SLP instruction and feedback, Compensatory strategies, Patient/family education, and Re-evaluation  Su Monks, [place] 04/16/2022, 1:17 PM

## 2022-05-01 ENCOUNTER — Other Ambulatory Visit: Payer: Self-pay

## 2022-05-01 NOTE — Patient Outreach (Signed)
[facility]) Care Management  05/01/2022  Diane Richardson [birth year] [id number]   Telephone call to patient for nurse call. She states she is doing well. Considering bunion surgery other than that she is doing well.  Discussed [facility] services and support. Patient declined services at this time.  Discussed AWV with patient.  She says she has met with the PA but not the nurse.  Discussed importance of AWV.  Patient states she will call the office herself.     Plan: RN CM will close case.  Jone Baseman, RN, MSN [facility] Care Management Care Management Coordinator Direct Line [phone] Toll Free: [phone]  Fax: [phone]

## 2022-05-30 DIAGNOSIS — K219 Gastro-esophageal reflux disease without esophagitis: Secondary | ICD-10-CM | POA: Diagnosis not present

## 2022-05-30 DIAGNOSIS — K317 Polyp of stomach and duodenum: Secondary | ICD-10-CM | POA: Diagnosis not present

## 2022-05-30 DIAGNOSIS — K3189 Other diseases of stomach and duodenum: Secondary | ICD-10-CM | POA: Diagnosis not present

## 2022-05-30 DIAGNOSIS — R1310 Dysphagia, unspecified: Secondary | ICD-10-CM | POA: Diagnosis not present

## 2022-05-30 DIAGNOSIS — K319 Disease of stomach and duodenum, unspecified: Secondary | ICD-10-CM | POA: Diagnosis not present

## 2022-05-30 DIAGNOSIS — K2970 Gastritis, unspecified, without bleeding: Secondary | ICD-10-CM | POA: Diagnosis not present

## 2022-05-30 DIAGNOSIS — K2289 Other specified disease of esophagus: Secondary | ICD-10-CM | POA: Diagnosis not present

## 2022-06-03 DIAGNOSIS — K2289 Other specified disease of esophagus: Secondary | ICD-10-CM | POA: Diagnosis not present

## 2022-06-03 DIAGNOSIS — K319 Disease of stomach and duodenum, unspecified: Secondary | ICD-10-CM | POA: Diagnosis not present

## 2022-06-25 ENCOUNTER — Other Ambulatory Visit (HOSPITAL_BASED_OUTPATIENT_CLINIC_OR_DEPARTMENT_OTHER): Payer: Self-pay

## 2022-06-25 MED ORDER — COMIRNATY 30 MCG/0.3ML IM SUSY
PREFILLED_SYRINGE | INTRAMUSCULAR | 0 refills | Status: DC
  Filled 2022-06-25: qty 0.3, 1d supply, fill #0

## 2022-07-05 ENCOUNTER — Other Ambulatory Visit: Payer: Self-pay | Admitting: Cardiology

## 2022-07-09 DIAGNOSIS — F330 Major depressive disorder, recurrent, mild: Secondary | ICD-10-CM | POA: Diagnosis not present

## 2022-08-11 DIAGNOSIS — F330 Major depressive disorder, recurrent, mild: Secondary | ICD-10-CM | POA: Diagnosis not present

## 2022-08-11 DIAGNOSIS — M7918 Myalgia, other site: Secondary | ICD-10-CM | POA: Diagnosis not present

## 2022-08-11 DIAGNOSIS — F22 Delusional disorders: Secondary | ICD-10-CM | POA: Diagnosis not present

## 2022-08-11 DIAGNOSIS — F419 Anxiety disorder, unspecified: Secondary | ICD-10-CM | POA: Diagnosis not present

## 2022-08-20 DIAGNOSIS — S76302A Unspecified injury of muscle, fascia and tendon of the posterior muscle group at thigh level, left thigh, initial encounter: Secondary | ICD-10-CM | POA: Diagnosis not present

## 2022-08-23 ENCOUNTER — Emergency Department (HOSPITAL_BASED_OUTPATIENT_CLINIC_OR_DEPARTMENT_OTHER): Payer: Medicare PPO | Admitting: Radiology

## 2022-08-23 ENCOUNTER — Emergency Department (HOSPITAL_BASED_OUTPATIENT_CLINIC_OR_DEPARTMENT_OTHER)
Admission: EM | Admit: 2022-08-23 | Discharge: 2022-08-23 | Disposition: A | Discharge status: Home / Self Care | Payer: Medicare PPO | Attending: Emergency Medicine | Admitting: Emergency Medicine

## 2022-08-23 ENCOUNTER — Encounter (HOSPITAL_BASED_OUTPATIENT_CLINIC_OR_DEPARTMENT_OTHER): Payer: Self-pay

## 2022-08-23 ENCOUNTER — Other Ambulatory Visit: Payer: Self-pay

## 2022-08-23 DIAGNOSIS — Z79899 Other long term (current) drug therapy: Secondary | ICD-10-CM | POA: Diagnosis not present

## 2022-08-23 DIAGNOSIS — W01198A Fall on same level from slipping, tripping and stumbling with subsequent striking against other object, initial encounter: Secondary | ICD-10-CM | POA: Diagnosis not present

## 2022-08-23 DIAGNOSIS — S5001XA Contusion of right elbow, initial encounter: Secondary | ICD-10-CM | POA: Insufficient documentation

## 2022-08-23 DIAGNOSIS — Z7982 Long term (current) use of aspirin: Secondary | ICD-10-CM | POA: Insufficient documentation

## 2022-08-23 DIAGNOSIS — M25511 Pain in right shoulder: Secondary | ICD-10-CM | POA: Diagnosis not present

## 2022-08-23 DIAGNOSIS — Y92009 Unspecified place in unspecified non-institutional (private) residence as the place of occurrence of the external cause: Secondary | ICD-10-CM | POA: Diagnosis not present

## 2022-08-23 DIAGNOSIS — S52021A Displaced fracture of olecranon process without intraarticular extension of right ulna, initial encounter for closed fracture: Secondary | ICD-10-CM

## 2022-08-23 DIAGNOSIS — S59901A Unspecified injury of right elbow, initial encounter: Secondary | ICD-10-CM | POA: Diagnosis present

## 2022-08-23 DIAGNOSIS — S52031A Displaced fracture of olecranon process with intraarticular extension of right ulna, initial encounter for closed fracture: Secondary | ICD-10-CM | POA: Diagnosis not present

## 2022-08-23 MED ORDER — KETOROLAC TROMETHAMINE 15 MG/ML IJ SOLN
30.0000 mg | Freq: Once | INTRAMUSCULAR | Status: AC
  Administered 2022-08-23: 30 mg via INTRAMUSCULAR
  Filled 2022-08-23: qty 2

## 2022-08-23 NOTE — ED Triage Notes (Signed)
Patient here POV from Home.  Endorses stepping off Sidewalk accidentally today shortly PTA and falling onto Right Arm. Endorses Pain to Right Elbow and Right Clavicle.   No Head Injury. No LOC. No Anticoagulants.   NAD Noted during Triage. A&Ox4. GCS 15. BIB Wheelchair.

## 2022-08-23 NOTE — ED Provider Notes (Signed)
[facility] EMERGENCY DEPT Provider Note   CSN: [id number] Arrival date & time: 08/23/22  1315     History Chief Complaint  Patient presents with   Diane Richardson Diane Richardson is a 71 y.o. female.  Patient presents after a witnessed fall from standing height at home.  She stepped off the sidewalk and fell onto her right side.  Per her husband's report, her elbow hit the ground first.  She is having some pain in that elbow and in the right-sided clavicle.  She notes that she has a remote history of a right-sided clavicle fracture on that side.  She did not hit her head or lose consciousness.  She is not on any blood thinners.        Home Medications Prior to Admission medications   Medication Sig Start Date End Date Taking? Authorizing Provider  aspirin 325 MG tablet Take 1 tablet by mouth daily.    [provider]  bismuth subsalicylate (PEPTO BISMOL) 262 MG/15ML suspension Take 30 mLs by mouth every 6 (six) hours as needed for indigestion.    [provider]  Cholecalciferol (VITAMIN D) 50 MCG (2000 UT) CAPS Take 2,000 Units by mouth daily.    [provider]  Coenzyme Q10 (COQ-10) 200 MG CAPS Take 200 mg by mouth daily.    [provider]  COVID-19 mRNA vaccine [phone] (COMIRNATY) syringe Inject into the muscle. 06/25/22     DULoxetine (CYMBALTA) 60 MG capsule Take 60 mg by mouth daily.    [provider]  ezetimibe (ZETIA) 10 MG tablet Take 1 tablet (10 mg total) by mouth daily. 11/08/21 12/13/22  Adrian Prows, MD  LORazepam (ATIVAN) 0.5 MG tablet Take 1 tablet (0.5 mg total) by mouth 2 (two) times daily. Patient taking differently: Take 0.5 mg by mouth See admin instructions. Take 0.5 mg in the morning, may take a second 0.5 mg dose as needed for anxiety 10/12/14   Kerrie Buffalo, NP  Magnesium 300 MG CAPS 1 capsule with a meal    [provider]  Menthol-Zinc Oxide (GOLD BOND EX) Apply 1 application topically daily  as needed (pain/itching).    [provider]  metoprolol succinate (TOPROL-XL) 25 MG 24 hr tablet TAKE 1 TABLET BY MOUTH EVERY NIGHT AT BEDTIME 07/07/22   Adrian Prows, MD  nitroGLYCERIN (NITROSTAT) 0.4 MG SL tablet DISSOLVE 1 TABLET UNDER THE TONGUE EVERY 5 MINS AS NEEDED FOR CHEST PAIN 10/09/21   Adrian Prows, MD  omeprazole (PRILOSEC) 40 MG capsule Take 40 mg by mouth daily.    [provider]  rosuvastatin (CRESTOR) 20 MG tablet TAKE 1 TABLET(20 MG) BY MOUTH DAILY 07/07/22   Adrian Prows, MD  TURMERIC PO Take by mouth.    [provider]  VRAYLAR capsule Take 1.5 mg by mouth daily. Patient not taking: Reported on 05/01/2022 10/12/19   [provider]      Allergies    Penicillins, Codeine, Donnatal [belladonna alk-phenobarb er], and Percocet [oxycodone-acetaminophen]    Review of Systems   Review of Systems  Musculoskeletal:  Positive for joint swelling.  Neurological:  Negative for syncope, weakness, numbness and headaches.    Physical Exam Updated Vital Signs BP 135/66 (BP Location: Left Arm)   Pulse (!) 59   Temp 98.2 F (36.8 C) (Oral)   Resp 20   Ht _0  (1.549 m)   Wt 58.2 kg   LMP  (LMP Unknown)   SpO2 100%   BMI  24.24 kg/m  Physical Exam Vitals reviewed.  Constitutional:      General: She is not in acute distress. HENT:     Mouth/Throat:     Mouth: Mucous membranes are moist.  Cardiovascular:     Rate and Rhythm: Normal rate and regular rhythm.  Pulmonary:     Effort: Pulmonary effort is normal. No respiratory distress.     Breath sounds: Normal breath sounds.  Musculoskeletal:     Cervical back: Neck supple.     Comments: Marked edema and ecchymoses to the right elbow.  Collateral ligaments seem to be intact though exam is limited by pain.  She is able to extend the joint though with great difficulty secondary to pain. Radial, median, ulnar nerve function all intact.  Distal pulses swift and strong. Full range of motion about the  right shoulder, albeit with pain, no crepitus.     ED Results / Procedures / Treatments   Labs (all labs ordered are listed, but only abnormal results are displayed) Labs Reviewed - No data to display  EKG None  Radiology DG Clavicle Right  Result Date: 08/23/2022 CLINICAL DATA:  Right clavicle pain following a fall today. EXAM: RIGHT CLAVICLE - 2+ VIEWS COMPARISON:  Right shoulder dated 12/05/2019 FINDINGS: Again demonstrated is an old, healed fracture of the distal right clavicle. No acute fracture or dislocation seen. Diffuse osteopenia. IMPRESSION: No acute fracture. Old, healed distal right clavicle fracture. Electronically Signed   By: Claudie Revering M.D.   On: 08/23/2022 14:11   DG Elbow Complete Right  Result Date: 08/23/2022 CLINICAL DATA:  Right elbow pain following a fall today. EXAM: RIGHT ELBOW - COMPLETE 3+ VIEW COMPARISON:  12/05/2019 FINDINGS: Acute fracture of the olecranon with mild distraction of the fragments. There is an associated elbow effusion and posterior soft tissue swelling. IMPRESSION: Acute olecranon fracture. Electronically Signed   By: Claudie Revering M.D.   On: 08/23/2022 14:10    Procedures Procedures    Medications Ordered in ED Medications - No data to display  ED Course/ Medical Decision Making/ A&P                           Medical Decision Making Patient coming in after a ground-level mechanical fall.  Right elbow imaging and exam consistent with acute olecranon fracture.  On review of imaging, it is minimally distracted/displaced.  Extensor function and distal nerve and vasculature intact on exam.  Patient is not able to take narcotics due to adverse effects.  Given a dose of Toradol in the ED for pain control.  Placed in a long-arm splint and discharged with instructions for close Ortho follow-up.    Final Clinical Impression(s) / ED Diagnoses Final diagnoses:  None    Rx / DC Orders ED Discharge Orders     None         Eppie Gibson, MD 08/23/22 1751    Fransico Meadow, MD 08/25/22 [phone]

## 2022-08-23 NOTE — ED Notes (Signed)
Discharge instructions, pain management, and follow up care reviewed and explained. Pt verbalized understanding and had no further questions on d/c. Pt was taken to POV by wheelchair.

## 2022-08-23 NOTE — Discharge Instructions (Addendum)
Ms Langelier, [place] so sorry that you had this fall. It does look like you had broke your elbow. You can continue taking Tylenol for pain control. It would also be okay to add on some naproxen (Aleve). Please call Dr. Odis Hollingshead who's number is above to schedule an appt early next week.   Dorothyann Gibbs, MD

## 2022-08-27 DIAGNOSIS — S52031A Displaced fracture of olecranon process with intraarticular extension of right ulna, initial encounter for closed fracture: Secondary | ICD-10-CM | POA: Diagnosis not present

## 2022-08-27 DIAGNOSIS — M25521 Pain in right elbow: Secondary | ICD-10-CM | POA: Diagnosis not present

## 2022-09-04 ENCOUNTER — Encounter: Payer: Self-pay | Admitting: Cardiology

## 2022-09-05 DIAGNOSIS — G8918 Other acute postprocedural pain: Secondary | ICD-10-CM | POA: Diagnosis not present

## 2022-09-05 DIAGNOSIS — S52031A Displaced fracture of olecranon process with intraarticular extension of right ulna, initial encounter for closed fracture: Secondary | ICD-10-CM | POA: Diagnosis not present

## 2022-09-05 HISTORY — PX: ELBOW SURGERY: SHX618

## 2022-09-16 DIAGNOSIS — F419 Anxiety disorder, unspecified: Secondary | ICD-10-CM | POA: Diagnosis not present

## 2022-09-16 DIAGNOSIS — F330 Major depressive disorder, recurrent, mild: Secondary | ICD-10-CM | POA: Diagnosis not present

## 2022-09-18 DIAGNOSIS — M25621 Stiffness of right elbow, not elsewhere classified: Secondary | ICD-10-CM | POA: Diagnosis not present

## 2022-09-18 DIAGNOSIS — S52031D Displaced fracture of olecranon process with intraarticular extension of right ulna, subsequent encounter for closed fracture with routine healing: Secondary | ICD-10-CM | POA: Diagnosis not present

## 2022-09-23 ENCOUNTER — Other Ambulatory Visit (HOSPITAL_COMMUNITY): Payer: Self-pay

## 2022-09-24 ENCOUNTER — Other Ambulatory Visit: Payer: Self-pay | Admitting: Cardiology

## 2022-09-24 DIAGNOSIS — E7800 Pure hypercholesterolemia, unspecified: Secondary | ICD-10-CM

## 2022-09-24 DIAGNOSIS — M25621 Stiffness of right elbow, not elsewhere classified: Secondary | ICD-10-CM | POA: Diagnosis not present

## 2022-10-02 DIAGNOSIS — S52031A Displaced fracture of olecranon process with intraarticular extension of right ulna, initial encounter for closed fracture: Secondary | ICD-10-CM | POA: Diagnosis not present

## 2022-10-02 DIAGNOSIS — M25621 Stiffness of right elbow, not elsewhere classified: Secondary | ICD-10-CM | POA: Diagnosis not present

## 2022-10-09 DIAGNOSIS — M5451 Vertebrogenic low back pain: Secondary | ICD-10-CM | POA: Diagnosis not present

## 2022-10-10 DIAGNOSIS — M25621 Stiffness of right elbow, not elsewhere classified: Secondary | ICD-10-CM | POA: Diagnosis not present

## 2022-10-16 DIAGNOSIS — M25621 Stiffness of right elbow, not elsewhere classified: Secondary | ICD-10-CM | POA: Diagnosis not present

## 2022-10-20 DIAGNOSIS — M5451 Vertebrogenic low back pain: Secondary | ICD-10-CM | POA: Diagnosis not present

## 2022-10-22 DIAGNOSIS — M5451 Vertebrogenic low back pain: Secondary | ICD-10-CM | POA: Diagnosis not present

## 2022-10-23 DIAGNOSIS — S52031D Displaced fracture of olecranon process with intraarticular extension of right ulna, subsequent encounter for closed fracture with routine healing: Secondary | ICD-10-CM | POA: Diagnosis not present

## 2022-10-23 DIAGNOSIS — M25621 Stiffness of right elbow, not elsewhere classified: Secondary | ICD-10-CM | POA: Diagnosis not present

## 2022-10-27 DIAGNOSIS — M5451 Vertebrogenic low back pain: Secondary | ICD-10-CM | POA: Diagnosis not present

## 2022-10-29 DIAGNOSIS — M5451 Vertebrogenic low back pain: Secondary | ICD-10-CM | POA: Diagnosis not present

## 2022-10-30 DIAGNOSIS — M25621 Stiffness of right elbow, not elsewhere classified: Secondary | ICD-10-CM | POA: Diagnosis not present

## 2022-11-03 DIAGNOSIS — M5451 Vertebrogenic low back pain: Secondary | ICD-10-CM | POA: Diagnosis not present

## 2022-11-05 DIAGNOSIS — M5451 Vertebrogenic low back pain: Secondary | ICD-10-CM | POA: Diagnosis not present

## 2022-11-06 DIAGNOSIS — F0632 Mood disorder due to known physiological condition with major depressive-like episode: Secondary | ICD-10-CM | POA: Diagnosis not present

## 2022-11-06 DIAGNOSIS — F330 Major depressive disorder, recurrent, mild: Secondary | ICD-10-CM | POA: Diagnosis not present

## 2022-11-07 DIAGNOSIS — M25621 Stiffness of right elbow, not elsewhere classified: Secondary | ICD-10-CM | POA: Diagnosis not present

## 2022-11-12 ENCOUNTER — Ambulatory Visit: Payer: Medicare PPO | Admitting: Cardiology

## 2022-11-13 DIAGNOSIS — M25621 Stiffness of right elbow, not elsewhere classified: Secondary | ICD-10-CM | POA: Diagnosis not present

## 2022-11-19 ENCOUNTER — Ambulatory Visit: Payer: Medicare PPO | Admitting: Cardiology

## 2022-11-19 ENCOUNTER — Encounter: Payer: Self-pay | Admitting: Cardiology

## 2022-11-19 VITALS — BP 105/57 | HR 60 | Ht 61.0 in | Wt 135.0 lb

## 2022-11-19 DIAGNOSIS — I2510 Atherosclerotic heart disease of native coronary artery without angina pectoris: Secondary | ICD-10-CM

## 2022-11-19 DIAGNOSIS — E7800 Pure hypercholesterolemia, unspecified: Secondary | ICD-10-CM | POA: Diagnosis not present

## 2022-11-19 DIAGNOSIS — I25118 Atherosclerotic heart disease of native coronary artery with other forms of angina pectoris: Secondary | ICD-10-CM | POA: Diagnosis not present

## 2022-11-19 DIAGNOSIS — M25621 Stiffness of right elbow, not elsewhere classified: Secondary | ICD-10-CM | POA: Diagnosis not present

## 2022-11-19 NOTE — Progress Notes (Signed)
Primary Physician/Referring:  Carol Ada, MD  Patient ID: Diane Richardson, female    DOB: Jan 24, 1951, 72 y.o.   MRN: [id number]  Chief Complaint  Patient presents with   Coronary Artery Disease   Hyperlipidemia   Follow-up    1 year   HPI:    Diane Richardson  is a 72 y.o. female  with history of fibromyalgia, depression, bipolar disorder, hyperlipidemia, hyperlipidemia, and CAD with stenting to OM1 in 2020 and bilateral carotid atherosclerosis but no significant stenosis.  Had seen her 2 months ago and there was a discrepancy in the carotid duplex performed previously and in December 2022 and so obtain repeat carotid duplex in external facility which again confirmed mild diffuse sclerosis without high-grade stenosis.  This is a annual visit.  She remains asymptomatic from cardiac standpoint.   Past Medical History:  Diagnosis Date   ADD (attention deficit disorder)    Anxiety    Coronary artery disease    Depression    DVT (deep venous thrombosis) (HCC)    Fatigue    chronic   Fibromyalgia    Hyperlipidemia    Muscle spasm    bad   PE (pulmonary embolism)    PTSD (post-traumatic stress disorder)      Social History   Tobacco Use   Smoking status: Never   Smokeless tobacco: Never  Substance Use Topics   Alcohol use: Yes    Comment: 7 glasses of wine weekly   Marital Status: Married   ROS  Review of Systems  Cardiovascular:  Negative for chest pain, claudication, dyspnea on exertion, leg swelling and syncope.  Gastrointestinal:  Negative for melena.   Objective  Blood pressure (!) 105/57, pulse 60, height '5\' 1"'$  (1.549 m), weight 135 lb (61.2 kg), SpO2 98 %.     11/19/2022    3:56 PM 08/23/2022    6:03 PM 08/23/2022    4:12 PM  Vitals with BMI  Height '5\' 1"'$     Weight 135 lbs    BMI [id number]    Systolic [id number]  Diastolic 57 80 66  Pulse 60 60 59     Physical Exam Constitutional:      General: She is not in acute distress.    Appearance: She is  well-developed.  Neck:     Vascular: Carotid bruit (Bilateral right greater than left) present. No JVD.  Cardiovascular:     Rate and Rhythm: Normal rate and regular rhythm.     Pulses: Intact distal pulses.     Heart sounds: No murmur heard.    No gallop.  Pulmonary:     Effort: Pulmonary effort is normal. No accessory muscle usage.     Breath sounds: Normal breath sounds.  Abdominal:     General: Bowel sounds are normal.     Palpations: Abdomen is soft.    Laboratory examination:   External labs:   Labs 03/25/2022:  Hb 12.6/HCT 38.6, platelets 209.  Normal indicis.  Serum glucose 94 mg, BUN 21, creatinine 0.83, EGFR 76,, LFTs essentially normal with minimally elevated ALT at 47 (0-32).  Total cholesterol 120, triglycerides 65, HDL 57, LDL 49.  Medications and allergies   Allergies  Allergen Reactions   Penicillins Hives and Diarrhea    Did it involve swelling of the face/tongue/throat, SOB, or low BP? No Did it involve sudden or severe rash/hives, skin peeling, or any reaction on the inside of your mouth or nose? Yes Did you need to seek  medical attention at a hospital or doctor's office? Yes When did it last happen?      childhood allergy If all above answers are "NO", may proceed with cephalosporin use.    Other Other (See Comments)   Oxycodone-Acetaminophen Other (See Comments)   Codeine Hives and Diarrhea   Donnatal [Belladonna Alk-Phenobarb Er] Rash   Percocet [Oxycodone-Acetaminophen] Other (See Comments)    delusional     Current Outpatient Medications:    aspirin 325 MG tablet, Take 1 tablet by mouth daily., Disp: , Rfl:    bismuth subsalicylate (PEPTO BISMOL) 262 MG/15ML suspension, Take 30 mLs by mouth every 6 (six) hours as needed for indigestion., Disp: , Rfl:    Cholecalciferol (VITAMIN D) 50 MCG (2000 UT) CAPS, Take 2,000 Units by mouth daily., Disp: , Rfl:    Coenzyme Q10 (COQ-10) 200 MG CAPS, Take 200 mg by mouth daily., Disp: , Rfl:    DULoxetine  (CYMBALTA) 60 MG capsule, Take 60 mg by mouth daily., Disp: , Rfl:    ezetimibe (ZETIA) 10 MG tablet, TAKE 1 TABLET(10 MG) BY MOUTH DAILY, Disp: 90 tablet, Rfl: 0   LORazepam (ATIVAN) 0.5 MG tablet, Take 1 tablet (0.5 mg total) by mouth 2 (two) times daily. (Patient taking differently: Take 0.5 mg by mouth See admin instructions. Take 0.5 mg in the morning, may take a second 0.5 mg dose as needed for anxiety), Disp: 30 tablet, Rfl: 0   Magnesium 300 MG CAPS, 1 capsule with a meal, Disp: , Rfl:    Menthol-Zinc Oxide (GOLD BOND EX), Apply 1 application topically daily as needed (pain/itching)., Disp: , Rfl:    metoprolol succinate (TOPROL-XL) 25 MG 24 hr tablet, Take 25 mg by mouth at bedtime., Disp: , Rfl:    nitroGLYCERIN (NITROSTAT) 0.4 MG SL tablet, DISSOLVE 1 TABLET UNDER THE TONGUE EVERY 5 MINS AS NEEDED FOR CHEST PAIN, Disp: 25 tablet, Rfl: 3   pantoprazole (PROTONIX) 40 MG tablet, Take 40 mg by mouth daily., Disp: , Rfl:    rosuvastatin (CRESTOR) 20 MG tablet, TAKE 1 TABLET(20 MG) BY MOUTH DAILY, Disp: 90 tablet, Rfl: 1   COVID-19 mRNA vaccine 2023-2024 (COMIRNATY) syringe, Inject into the muscle., Disp: 0.3 mL, Rfl: 0    Radiology:   CT Head Wo Contrast 12/05/2019: Chronic atrophic and ischemic changes without acute abnormality.  Cardiac Studies:   Exercise sestamibi stress test 10/25/2018: 1. The patient performed treadmill exercise using Bruce protocol, completing 5:03 minutes. The patient completed an estimated workload of 7 METS, reaching 86% of the maximum predicted heart rate. Exercise capacity was low. Hemodynamic response was normal. Stress symptoms included exercise induced chest pain, relieved with rest.   2. The overall quality of the study is good. There is no evidence of abnormal lung activity. Stress and rest SPECT images demonstrate homogeneous tracer distribution throughout the myocardium. TID was mildly increased at 1.1. Gated SPECT imaging reveals normal myocardial  thickening and wall motion. The left ventricular ejection fraction was normal (66%).   3. Intermediate risk study given symptoms and EKG changes. No SPECT evidence of ischemia or infarction see. Clinical correlation recommended.  Coronary angiogram 11/23/2018: Left dominant circulation, focal high-grade 99% stenosis in the very large OM1, otherwise tortuous LAD and circumflex coronary artery, no significant disease. S/P stenting of OM1 with 2.5 x 15 mm Orsiro DES at 11 atmospheric pressure giving a 2.75 mm lumen.  Stenosis reduced from 99% to 0% with maintenance of TIMI-3 to TIMI-3 flow.  Echocardiogram 03/20/2020: Left ventricle  cavity is normal in size and wall thickness. Normal global wall motion. Normal LV systolic function with EF 60%. Doppler evidence of grade I (impaired) diastolic dysfunction, normal LAP.  Moderate (Grade II) mitral regurgitation. Normal right atrial pressure.  No significant change compared to previous study on 10/29/2018.   Carotid artery duplex 10/16/2021: Bilateral carotid atherosclerosis. Negative for significant stenosis. Degree of narrowing less than 50% bilaterally by ultrasound criteria. Patent antegrade vertebral flow bilaterally Study correlates with the findings on 08/20/2021, previously noted high-grade stenosis in the right ICA and moderate stenosis in the left ICA on 01/22/2021 could not be confirmed.  Follow-up studies if clinically indicated.  EKG EKG 10/21/2022: Normal sinus rhythm at the rate of 59 bpm, incomplete right bundle branch block.  No evidence of ischemia, normal EKG.  No change from 09/04/2021.  Assessment     ICD-10-CM   1. Atherosclerosis of native coronary artery of native heart without angina pectoris  I25.10 EKG 12-Lead    2. Pure hypercholesterolemia  E78.00       No orders of the defined types were placed in this encounter.   Medications Discontinued During This Encounter  Medication Reason   omeprazole (PRILOSEC) 40 MG capsule  Patient Preference   metoprolol succinate (TOPROL-XL) 25 MG 24 hr tablet Change in therapy   TURMERIC PO Patient Preference   VRAYLAR capsule Patient Preference   metoprolol succinate (TOPROL-XL) 25 MG 24 hr tablet Change in therapy    Recommendations:   Diane Richardson  is a 72 y.o. female  with history of fibromyalgia, depression, bipolar disorder, hyperlipidemia, hyperlipidemia, and CAD with stenting to OM1 in 2020 and bilateral carotid atherosclerosis but no significant stenosis.  Had seen her 2 months ago and there was a discrepancy in the carotid duplex performed previously and in December 2022 and so obtain repeat carotid duplex in external facility which again confirmed mild diffuse sclerosis without high-grade stenosis.  This is a annual visit.  She remains asymptomatic from cardiac standpoint.   1. Atherosclerosis of native coronary artery of native heart without angina pectoris [facility]) Patient without recurrence of angina pectoris, no chest pain, no shortness of breath, continues to remain active and continues to swim regularly as well.  Recently had a fall and had broken right elbow but otherwise had been swimming prior to that.  No changes on the EKG.  Continue present medical management.  2. Pure hypercholesterolemia Lipids under excellent control, she is on Crestor and also on Zetia.  Continue the same for now.  Normal blood pressure, normal physical examination.  No changes in the medications were done today.  I will see him back on annual basis.  I may consider repeating a carotid duplex in a couple years.  All her risk factors are well-controlled.   Adrian Prows, MD, [facility] 11/19/2022, 4:33 PM Office: [phone] Pager: [phone]

## 2022-11-28 DIAGNOSIS — M25621 Stiffness of right elbow, not elsewhere classified: Secondary | ICD-10-CM | POA: Diagnosis not present

## 2022-12-03 DIAGNOSIS — M5451 Vertebrogenic low back pain: Secondary | ICD-10-CM | POA: Diagnosis not present

## 2022-12-04 DIAGNOSIS — S52031A Displaced fracture of olecranon process with intraarticular extension of right ulna, initial encounter for closed fracture: Secondary | ICD-10-CM | POA: Diagnosis not present

## 2022-12-08 DIAGNOSIS — M5451 Vertebrogenic low back pain: Secondary | ICD-10-CM | POA: Diagnosis not present

## 2022-12-10 DIAGNOSIS — M5451 Vertebrogenic low back pain: Secondary | ICD-10-CM | POA: Diagnosis not present

## 2022-12-17 DIAGNOSIS — M5451 Vertebrogenic low back pain: Secondary | ICD-10-CM | POA: Diagnosis not present

## 2022-12-19 DIAGNOSIS — M5451 Vertebrogenic low back pain: Secondary | ICD-10-CM | POA: Diagnosis not present

## 2022-12-19 DIAGNOSIS — M25621 Stiffness of right elbow, not elsewhere classified: Secondary | ICD-10-CM | POA: Diagnosis not present

## 2022-12-24 DIAGNOSIS — M5451 Vertebrogenic low back pain: Secondary | ICD-10-CM | POA: Diagnosis not present

## 2022-12-29 ENCOUNTER — Other Ambulatory Visit: Payer: Self-pay | Admitting: Cardiology

## 2022-12-31 DIAGNOSIS — M5451 Vertebrogenic low back pain: Secondary | ICD-10-CM | POA: Diagnosis not present

## 2023-01-02 DIAGNOSIS — M25621 Stiffness of right elbow, not elsewhere classified: Secondary | ICD-10-CM | POA: Diagnosis not present

## 2023-01-02 DIAGNOSIS — M5451 Vertebrogenic low back pain: Secondary | ICD-10-CM | POA: Diagnosis not present

## 2023-01-11 ENCOUNTER — Other Ambulatory Visit: Payer: Self-pay | Admitting: Cardiology

## 2023-01-26 DIAGNOSIS — F330 Major depressive disorder, recurrent, mild: Secondary | ICD-10-CM | POA: Diagnosis not present

## 2023-02-04 DIAGNOSIS — M8588 Other specified disorders of bone density and structure, other site: Secondary | ICD-10-CM | POA: Diagnosis not present

## 2023-02-09 ENCOUNTER — Telehealth: Payer: Self-pay | Admitting: Cardiology

## 2023-02-09 DIAGNOSIS — I6523 Occlusion and stenosis of bilateral carotid arteries: Secondary | ICD-10-CM

## 2023-02-09 NOTE — Telephone Encounter (Signed)
ICD-10-CM   1. Atherosclerosis of both carotid arteries  I65.23 PCV CAROTID DUPLEX (BILATERAL)

## 2023-02-23 ENCOUNTER — Other Ambulatory Visit: Payer: Self-pay | Admitting: Cardiology

## 2023-02-23 ENCOUNTER — Telehealth: Payer: Self-pay

## 2023-02-23 DIAGNOSIS — E7800 Pure hypercholesterolemia, unspecified: Secondary | ICD-10-CM

## 2023-02-23 NOTE — Telephone Encounter (Signed)
Totally left to her PCP

## 2023-02-23 NOTE — Telephone Encounter (Signed)
Patient was recommended to start taking calcium she asked if you could recommend a certain dose

## 2023-02-24 NOTE — Telephone Encounter (Signed)
Pt aware.

## 2023-03-05 ENCOUNTER — Ambulatory Visit: Payer: Medicare PPO

## 2023-03-05 DIAGNOSIS — I6523 Occlusion and stenosis of bilateral carotid arteries: Secondary | ICD-10-CM

## 2023-03-08 NOTE — Progress Notes (Signed)
Carotid artery duplex 03/05/2023: Duplex suggests stenosis in the right internal carotid artery (minimal). <50% stenosis in the right external carotid artery. Duplex suggests stenosis in the left internal carotid artery (minimal). Antegrade right vertebral artery flow. Antegrade left vertebral artery flow. Compared to the study done on 08/20/2021, left ICA stenosis of 16 to 49% not present. There is further regression of stenosis severity on the left compared to prior studies.  Further studies if clinically indicated.

## 2023-03-09 DIAGNOSIS — F330 Major depressive disorder, recurrent, mild: Secondary | ICD-10-CM | POA: Diagnosis not present

## 2023-03-24 DIAGNOSIS — K219 Gastro-esophageal reflux disease without esophagitis: Secondary | ICD-10-CM | POA: Diagnosis not present

## 2023-03-24 DIAGNOSIS — I6523 Occlusion and stenosis of bilateral carotid arteries: Secondary | ICD-10-CM | POA: Diagnosis not present

## 2023-03-24 DIAGNOSIS — I2510 Atherosclerotic heart disease of native coronary artery without angina pectoris: Secondary | ICD-10-CM | POA: Diagnosis not present

## 2023-03-24 DIAGNOSIS — M8580 Other specified disorders of bone density and structure, unspecified site: Secondary | ICD-10-CM | POA: Diagnosis not present

## 2023-03-24 DIAGNOSIS — Z0000 Encounter for general adult medical examination without abnormal findings: Secondary | ICD-10-CM | POA: Diagnosis not present

## 2023-03-24 DIAGNOSIS — F319 Bipolar disorder, unspecified: Secondary | ICD-10-CM | POA: Diagnosis not present

## 2023-03-24 DIAGNOSIS — Z79899 Other long term (current) drug therapy: Secondary | ICD-10-CM | POA: Diagnosis not present

## 2023-03-24 DIAGNOSIS — R202 Paresthesia of skin: Secondary | ICD-10-CM | POA: Diagnosis not present

## 2023-03-24 DIAGNOSIS — E785 Hyperlipidemia, unspecified: Secondary | ICD-10-CM | POA: Diagnosis not present

## 2023-03-24 DIAGNOSIS — E559 Vitamin D deficiency, unspecified: Secondary | ICD-10-CM | POA: Diagnosis not present

## 2023-03-24 NOTE — Progress Notes (Signed)
Very mild disease in carotid and stable

## 2023-03-24 NOTE — Progress Notes (Signed)
 Called patient, Na, LMAM.

## 2023-03-25 NOTE — Progress Notes (Signed)
 2nd attempt : Called patient, Na, LMAM

## 2023-03-31 NOTE — Progress Notes (Signed)
Called patient no answer.

## 2023-05-18 ENCOUNTER — Telehealth: Payer: Self-pay | Admitting: Neurology

## 2023-05-18 ENCOUNTER — Encounter: Payer: Self-pay | Admitting: Neurology

## 2023-05-18 NOTE — Telephone Encounter (Signed)
LVM and sent letter in mail informing pt of need to reschedule 06/23/23 appt - MD out

## 2023-05-20 DIAGNOSIS — L821 Other seborrheic keratosis: Secondary | ICD-10-CM | POA: Diagnosis not present

## 2023-05-20 DIAGNOSIS — L988 Other specified disorders of the skin and subcutaneous tissue: Secondary | ICD-10-CM | POA: Diagnosis not present

## 2023-05-20 DIAGNOSIS — B351 Tinea unguium: Secondary | ICD-10-CM | POA: Diagnosis not present

## 2023-05-20 DIAGNOSIS — L2089 Other atopic dermatitis: Secondary | ICD-10-CM | POA: Diagnosis not present

## 2023-05-20 DIAGNOSIS — L570 Actinic keratosis: Secondary | ICD-10-CM | POA: Diagnosis not present

## 2023-06-15 DIAGNOSIS — F419 Anxiety disorder, unspecified: Secondary | ICD-10-CM | POA: Diagnosis not present

## 2023-06-23 ENCOUNTER — Ambulatory Visit: Payer: Medicare PPO | Admitting: Neurology

## 2023-07-15 ENCOUNTER — Ambulatory Visit: Payer: Medicare PPO | Admitting: Neurology

## 2023-07-15 ENCOUNTER — Encounter: Payer: Self-pay | Admitting: Neurology

## 2023-07-15 VITALS — BP 113/67 | HR 71 | Ht 61.0 in | Wt 136.8 lb

## 2023-07-15 DIAGNOSIS — M21612 Bunion of left foot: Secondary | ICD-10-CM | POA: Diagnosis not present

## 2023-07-15 NOTE — Progress Notes (Signed)
Provider:  Melvyn Novas, MD  Primary Care Physician:  Merri Brunette, MD [phone] [address] [place] [address]     Referring Provider: Rayne Du [id number] [place],  [place] [address]          Chief Complaint according to patient   Patient presents with:     New Problem Patient (Initial Visit)           HISTORY OF PRESENT ILLNESS:  Diane Richardson is a 72 y.o. female patient who is seen upon referral on 07/15/2023 from PCP for a new pain in the foot.  Wether or not she has neuropathy?  Her podiatrist wants to know to see how the yield of bunion/ hammertoe surgery.   Chief concern according to patient :  see above .   I had seen this patient 2017 for insomnia and deferred.     Diane Richardson on 07/15/23  Review of Systems: Out of a complete 14 system review, the patient complains of only the following symptoms, and all other reviewed systems are negative.:    Pain is getting worse with longer standing and walking.   Weight gain due to inactivity, pain related.   Social History   Socioeconomic History   Marital status: Married    Spouse name: Not on file   Number of children: 1   Years of education: PHD   Highest education level: Not on file  Occupational History   Occupation: disabled    Comment: FORMER TEACHER  Tobacco Use   Smoking status: Never   Smokeless tobacco: Never  Vaping Use   Vaping status: Never Used  Substance and Sexual Activity   Alcohol use: Yes    Comment: 7 glasses of wine weekly   Drug use: Never   Sexual activity: Yes  Other Topics Concern   Not on file  Social History Narrative   Drinks less than one cup of caffeine daily.   Social Determinants of Health   Financial Resource Strain: Low Risk  (03/22/2019)   Overall Financial Resource Strain (CARDIA)    Difficulty of Paying Living Expenses: Not hard at all  Food Insecurity: No Food Insecurity (03/22/2019)   Hunger Vital Sign     Worried About Running Out of Food in the Last Year: Never true    Ran Out of Food in the Last Year: Never true  Transportation Needs: No Transportation Needs (05/01/2022)   PRAPARE - Administrator, Civil Service (Medical): No    Lack of Transportation (Non-Medical): No  Physical Activity: Insufficiently Active (03/22/2019)   Exercise Vital Sign    Days of Exercise per Week: 4 days    Minutes of Exercise per Session: 30 min  Stress: Stress Concern Present (03/22/2019)   [employer] of Occupational Health - Occupational Stress Questionnaire    Feeling of Stress : To some extent  Social Connections: Not on file    Family History  Problem Relation Age of Onset   Cancer Mother        ovarian    Peripheral vascular disease Mother 40   Hyperlipidemia Father    Hypertension Father    Multiple myeloma Father    CAD Father        CABG age 68   Heart disease Father        Pacer age 36   Diabetes Maternal Grandmother    Cancer Maternal Grandmother  stomach   Arthritis Brother    Dementia Maternal Grandfather    Colon polyps Maternal Grandfather    CAD Paternal Grandmother    Diabetes Paternal Grandmother    Dementia Paternal Grandfather     Past Medical History:  Diagnosis Date   ADD (attention deficit disorder)    Anxiety    Coronary artery disease    Depression    DVT (deep venous thrombosis) (HCC)    Fatigue    chronic   Fibromyalgia    Hyperlipidemia    Muscle spasm    bad   PE (pulmonary embolism)    PTSD (post-traumatic stress disorder)     Past Surgical History:  Procedure Laterality Date   BUNIONECTOMY     CARDIAC CATHETERIZATION     CATARACT EXTRACTION  01/2020   Right eye   childbirth  1989   x1,NVD   CORONARY STENT INTERVENTION N/A 11/23/2018   Procedure: CORONARY STENT INTERVENTION;  Surgeon: Yates Decamp, MD;  Location: [facility];  Service: Cardiovascular;  Laterality: N/A;  OM   ELBOW SURGERY Right 09/05/2022   FOOT  SURGERY Right    pt states she had bunion surgery right foot.   HAND SURGERY     LEFT HEART CATH AND CORONARY ANGIOGRAPHY N/A 11/23/2018   Procedure: LEFT HEART CATH AND CORONARY ANGIOGRAPHY;  Surgeon: Yates Decamp, MD;  Location: [facility];  Service: Cardiovascular;  Laterality: N/A;   PULMONARY EMBOLISM SURGERY  1983   x9 days     Current Outpatient Medications on File Prior to Visit  Medication Sig Dispense Refill   aspirin 325 MG tablet Take 1 tablet by mouth daily.     bismuth subsalicylate (PEPTO BISMOL) 262 MG/15ML suspension Take 30 mLs by mouth every 6 (six) hours as needed for indigestion.     buPROPion (WELLBUTRIN XL) 150 MG 24 hr tablet Take 150 mg by mouth daily.     Cholecalciferol (VITAMIN D) 50 MCG (2000 UT) CAPS Take 2,000 Units by mouth daily.     Coenzyme Q10 (COQ-10) 200 MG CAPS Take 200 mg by mouth daily.     COVID-19 mRNA vaccine 2023-2024 (COMIRNATY) syringe Inject into the muscle. 0.3 mL 0   DULoxetine (CYMBALTA) 60 MG capsule Take 60 mg by mouth daily.     ezetimibe (ZETIA) 10 MG tablet TAKE 1 TABLET(10 MG) BY MOUTH DAILY 90 tablet 3   LORazepam (ATIVAN) 0.5 MG tablet Take 1 tablet (0.5 mg total) by mouth 2 (two) times daily. (Patient taking differently: Take 0.5 mg by mouth See admin instructions. Take 0.5 mg in the morning, may take a second 0.5 mg dose as needed for anxiety) 30 tablet 0   Magnesium 300 MG CAPS 1 capsule with a meal     Menthol-Zinc Oxide (GOLD BOND EX) Apply 1 application topically daily as needed (pain/itching).     metoprolol succinate (TOPROL-XL) 25 MG 24 hr tablet TAKE 1 TABLET BY MOUTH EVERY NIGHT AT BEDTIME 90 tablet 3   nitroGLYCERIN (NITROSTAT) 0.4 MG SL tablet DISSOLVE 1 TABLET UNDER THE TONGUE EVERY 5 MINS AS NEEDED FOR CHEST PAIN 25 tablet 3   pantoprazole (PROTONIX) 40 MG tablet Take 40 mg by mouth daily.     rosuvastatin (CRESTOR) 20 MG tablet TAKE 1 TABLET(20 MG) BY MOUTH DAILY 90 tablet 1   No current facility-administered  medications on file prior to visit.    Allergies  Allergen Reactions   Penicillins Hives and Diarrhea    Did  it involve swelling of the face/tongue/throat, SOB, or low BP? No Did it involve sudden or severe rash/hives, skin peeling, or any reaction on the inside of your mouth or nose? Yes Did you need to seek medical attention at a hospital or doctor's office? Yes When did it last happen?      childhood allergy If all above answers are "NO", may proceed with cephalosporin use.    Other Other (See Comments)   Oxycodone-Acetaminophen Other (See Comments)   Codeine Hives and Diarrhea   Donnatal [Belladonna Alk-Phenobarb Er] Rash   Percocet [Oxycodone-Acetaminophen] Other (See Comments)    delusional     DIAGNOSTIC DATA (LABS, IMAGING, TESTING) - I reviewed patient records, labs, notes, testing and imaging myself where available.  Lab Results  Component Value Date   WBC 7.3 03/19/2020   HGB 12.1 03/19/2020   HCT 37.3 03/19/2020   MCV 93.3 03/19/2020   PLT 264 03/19/2020      Component Value Date/Time   NA 137 03/19/2020 0959   NA 142 01/27/2020 1616   K 4.0 03/19/2020 0959   CL 103 03/19/2020 0959   CO2 23 03/19/2020 0959   GLUCOSE 115 (H) 03/19/2020 0959   BUN 21 03/19/2020 0959   BUN 21 01/27/2020 1616   CREATININE 0.92 03/19/2020 0959   CREATININE 0.85 03/01/2018 1126   CALCIUM 9.4 03/19/2020 0959   PROT 6.3 01/27/2020 1616   ALBUMIN 4.7 01/27/2020 1616   AST 23 01/27/2020 1616   ALT 24 01/27/2020 1616   ALKPHOS 72 01/27/2020 1616   BILITOT <0.2 01/27/2020 1616   GFRNONAA >60 03/19/2020 0959   GFRNONAA 71 03/01/2018 1126   GFRAA >60 03/19/2020 0959   GFRAA 83 03/01/2018 1126   Lab Results  Component Value Date   CHOL 172 09/04/2021   HDL 71 09/04/2021   LDLCALC 84 09/04/2021   LDLDIRECT 78 09/04/2021   TRIG 93 09/04/2021   CHOLHDL 2.1 01/27/2020   No results found for: "HGBA1C" Lab Results  Component Value Date   VITAMINB12 624 10/03/2014   Lab  Results  Component Value Date   TSH 2.710 09/29/2014    PHYSICAL EXAM:  Today's Vitals   07/15/23 1350  BP: 113/67  Pulse: 71  Weight: 136 lb 12.8 oz (62.1 kg)  Height: 5\' 1"  (1.549 m)   Body mass index is 25.85 kg/m.   Wt Readings from Last 3 Encounters:  07/15/23 136 lb 12.8 oz (62.1 kg)  11/19/22 135 lb (61.2 kg)  08/23/22 128 lb 4.9 oz (58.2 kg)     Ht Readings from Last 3 Encounters:  07/15/23 5\' 1"  (1.549 m)  11/19/22 5\' 1"  (1.549 m)  08/23/22 5\' 1"  (1.549 m)      General: The patient is awake, alert and appears not in acute distress. The patient is well groomed. Head: Normocephalic, atraumatic. Neck is supple.  Cardiovascular:  Regular rate and cardiac rhythm by pulse,  without distended neck veins. Respiratory: Lungs are clear to auscultation.  Skin:  Without evidence of ankle edema, or rash. Trunk: The patient's posture is erect.   NEUROLOGIC EXAM: The patient is awake and alert, oriented to place and time.   Memory subjective described as intact.  Attention span & concentration ability appears normal.  Speech is fluent,  with dental plate induced dysphonia .  Mood and affect are appropriate.   Cranial nerves: no loss of smell or taste reported  Pupils are equal and briskly reactive to light. Hearing was intact to soft  voice and finger rubbing.    Facial motor strength is symmetric and tongue and uvula move midline.  Neck ROM : rotation, tilt and flexion extension were normal .   Motor exam:  Symmetric bulk, tone and ROM.   Normal tone without cog- wheeling, she is st lightly trembling- right over left grip strength- both thenar eminences atrophied.    Sensory:  Fine touch and vibration were tested and normal.  The pain is directly above the hallux and at the bunion  and between halluc and second toe- with visible skin irritation. N NO REDIATION of pain, no numbness at knee cap or knee.  Proprioception tested in the upper extremities was normal.   Gait  and station: Patient could rise unassisted from a seated position, walked without assistive device.  Stance is of normal width/ base and the patient turned with 3 steps.  Toe and heel walk were deferred.  Deep tendon reflexes: in the  upper and lower extremities are symmetric and intact.  Babinski response was normal     ASSESSMENT AND PLAN 46 -year old female patient referred in preparation for bunionectomy/ rule out Neuropathy , absent of DM or thyroid disease.  Presenting here with:    1) deformed bunion and hallux of the left foot with localized pain in the exposed areas,  formed callus over the second toe and area of chafing between them.   2)no evidence of peripheral neuropathy, no motor weakness for heel or toe movements. I will not order a NCV/ EMG as this would take months to be completed.   3) No Neurologic follow up needed      CC: Rayne Du [address] [place],  [place] [address] for allowing me to meet with and to take care of this pleasant patient.    After spending a total time of  21  minutes face to face and additional time for physical and neurologic examination, review of laboratory studies,  personal review of imaging studies, reports and results of other testing and review of referral information / records as far as provided in visit,   Electronically signed by: Melvyn Novas, MD 07/15/2023 2:01 PM  [facility] Board certified by The [employer] of Sleep Medicine and Diplomate of the [employer] of Sleep Medicine. Board certified In Neurology through the ABPN, Fellow of the [employer] of Neurology.

## 2023-08-17 DIAGNOSIS — F330 Major depressive disorder, recurrent, mild: Secondary | ICD-10-CM | POA: Diagnosis not present

## 2023-08-19 DIAGNOSIS — S52031D Displaced fracture of olecranon process with intraarticular extension of right ulna, subsequent encounter for closed fracture with routine healing: Secondary | ICD-10-CM | POA: Diagnosis not present

## 2023-09-22 ENCOUNTER — Other Ambulatory Visit: Payer: Self-pay | Admitting: Cardiology

## 2023-09-23 DIAGNOSIS — F330 Major depressive disorder, recurrent, mild: Secondary | ICD-10-CM | POA: Diagnosis not present

## 2023-10-19 DIAGNOSIS — S61459A Open bite of unspecified hand, initial encounter: Secondary | ICD-10-CM | POA: Diagnosis not present

## 2023-11-19 ENCOUNTER — Ambulatory Visit: Payer: Self-pay | Admitting: Cardiology

## 2023-11-24 DIAGNOSIS — F330 Major depressive disorder, recurrent, mild: Secondary | ICD-10-CM | POA: Diagnosis not present

## 2023-11-30 DIAGNOSIS — R4189 Other symptoms and signs involving cognitive functions and awareness: Secondary | ICD-10-CM | POA: Diagnosis not present

## 2023-12-15 DIAGNOSIS — R4189 Other symptoms and signs involving cognitive functions and awareness: Secondary | ICD-10-CM | POA: Diagnosis not present

## 2023-12-19 ENCOUNTER — Other Ambulatory Visit: Payer: Self-pay | Admitting: Cardiology

## 2023-12-21 ENCOUNTER — Other Ambulatory Visit: Payer: Self-pay | Admitting: Cardiology

## 2024-01-11 DIAGNOSIS — F3289 Other specified depressive episodes: Secondary | ICD-10-CM | POA: Diagnosis not present

## 2024-01-19 ENCOUNTER — Telehealth: Payer: Self-pay | Admitting: Neurology

## 2024-01-19 NOTE — Telephone Encounter (Signed)
 Pt called in regards to Schedule appt  Appt Scheduled

## 2024-03-07 ENCOUNTER — Telehealth: Payer: Self-pay | Admitting: Pharmacist

## 2024-03-07 NOTE — Progress Notes (Signed)
   03/07/2024  Patient ID: Diane Richardson, female   DOB: 1951/01/14, 73 y.o.   MRN: [id number]  Patient appeared on insurance report for at-risk for failing 2025 metric: Medication Adherence for Cholesterol (MAC)    Medication: Rosuvastatin  20mg  Last fill date: 1/14 90DS  Fail date: 02/29/24  Only 1 fill. If refilled, will fail the metric for 2025. 30DS refill sent on 12/21/23 by Dr. Ladona prior to appointment on 03/18/24. However, patient never picked up and restarted.   Will leave to address at upcoming visit.   Aloysius Lewis, PharmD [facility]  Phone Number: [phone]

## 2024-03-18 ENCOUNTER — Ambulatory Visit: Attending: Cardiology | Admitting: Cardiology

## 2024-03-18 ENCOUNTER — Encounter: Payer: Self-pay | Admitting: Cardiology

## 2024-03-18 VITALS — BP 109/67 | HR 63 | Resp 16 | Ht 61.0 in | Wt 145.3 lb

## 2024-03-18 DIAGNOSIS — K219 Gastro-esophageal reflux disease without esophagitis: Secondary | ICD-10-CM | POA: Diagnosis not present

## 2024-03-18 DIAGNOSIS — E7800 Pure hypercholesterolemia, unspecified: Secondary | ICD-10-CM | POA: Diagnosis not present

## 2024-03-18 DIAGNOSIS — R0989 Other specified symptoms and signs involving the circulatory and respiratory systems: Secondary | ICD-10-CM | POA: Diagnosis not present

## 2024-03-18 DIAGNOSIS — I6523 Occlusion and stenosis of bilateral carotid arteries: Secondary | ICD-10-CM | POA: Diagnosis not present

## 2024-03-18 MED ORDER — EZETIMIBE 10 MG PO TABS: 10.0000 mg | ORAL_TABLET | Freq: Every day | ORAL | 3 refills | Status: AC

## 2024-03-18 MED ORDER — ROSUVASTATIN CALCIUM 20 MG PO TABS: ORAL_TABLET | ORAL | 3 refills | Status: AC

## 2024-03-18 MED ORDER — PANTOPRAZOLE SODIUM 40 MG PO TBEC: 40.0000 mg | DELAYED_RELEASE_TABLET | Freq: Every day | ORAL | 0 refills | Status: AC

## 2024-03-18 MED ORDER — ASPIRIN 81 MG PO CHEW
81.0000 mg | CHEWABLE_TABLET | Freq: Every day | ORAL | Status: AC
Start: 2024-03-18 — End: ?

## 2024-03-18 NOTE — Progress Notes (Signed)
 Cardiology Office Note:  .   Date:  03/18/2024  ID:  Diane Richardson, DOB Feb 06, 1951, MRN [id number] PCP: Claudene Pellet, MD  [facility] Providers Cardiologist:  None   History of Present Illness: .   Diane Richardson is a 73 y.o. female with history of fibromyalgia, depression, bipolar disorder, hyperlipidemia, hyperlipidemia, and CAD with stenting to OM1 in 2020 and bilateral carotid atherosclerosis but no significant stenosis.  This is an annual visit.  She remains asymptomatic from cardiac standpoint.  She has normal LVEF and moderate MR by echocardiogram on 03/20/2020.  Discussed the use of AI scribe software for clinical note transcription with the patient, who gave verbal consent to proceed.  History of Present Illness Diane Richardson is a 73 year old female with coronary artery disease who presents for a cardiovascular follow-up.  She experiences increased fatigue over the past year, which she associates with lifestyle changes. Despite regular exercise, including swimming twice a week, she has gained approximately 20 pounds over the past year but believes she has recently lost about 10 pounds. She has a history of coronary artery disease with a stent placed in 2020. She is currently taking Zetia  and a daily 325 mg aspirin  but has recently run out of rosuvastatin . She denies chest pain or shortness of breath during swimming.  Labs   External Labs:  Care Everywhere PCP labs 03/25/2023:  TSH normal at 2.24.  Hb 12.2/HCT 39.9, platelets 220.  Serum glucose 84 mg, BUN 27, creatinine 0.75, EGFR 85 mL, potassium 5.1, LFTs normal.  Vitamin D  61.2.  Total cholesterol 135, triglycerides 99, HDL 61, LDL 55.  ROS  Review of Systems  Constitutional: Positive for malaise/fatigue.  Cardiovascular:  Negative for chest pain, dyspnea on exertion and leg swelling.   Physical Exam:   VS:  BP 109/67 (BP Location: Left Arm, Patient Position: Sitting, Cuff Size: Normal)   Pulse 63    Resp 16   Ht 5' 1 (1.549 m)   Wt 145 lb 4.8 oz (65.9 kg)   LMP  (LMP Unknown)   SpO2 99%   BMI 27.45 kg/m    Wt Readings from Last 3 Encounters:  03/18/24 145 lb 4.8 oz (65.9 kg)  07/15/23 136 lb 12.8 oz (62.1 kg)  11/19/22 135 lb (61.2 kg)    Physical Exam Neck:     Vascular: Carotid bruit (bilateral prominant) present. No JVD.  Cardiovascular:     Rate and Rhythm: Normal rate and regular rhythm.     Pulses: Intact distal pulses.     Heart sounds: Normal heart sounds. No murmur heard.    No gallop.  Pulmonary:     Effort: Pulmonary effort is normal.     Breath sounds: Normal breath sounds.  Abdominal:     General: Bowel sounds are normal.     Palpations: Abdomen is soft.  Musculoskeletal:     Right lower leg: No edema.     Left lower leg: No edema.    Studies Reviewed: SABRA    Coronary angiogram 11/23/2018: Left dominant circulation, focal high-grade 99% stenosis in the very large OM1, otherwise tortuous LAD and circumflex coronary artery, no significant disease. S/P stenting of OM1 with 2.5 x 15 mm Orsiro DES at 11 atmospheric pressure giving a 2.75 mm lumen.  Stenosis reduced from 99% to 0% with maintenance of TIMI-3 to TIMI-3 flow.   EKG:    EKG Interpretation Date/Time:  Friday March 18 2024 16:14:08 EDT Ventricular Rate:  17  PR Interval:  166 QRS Duration:  74 QT Interval:  444 QTC Calculation: 454 R Axis:   52  Text Interpretation: EKG 03/18/2024: Normal sinus rhythm at rate of 63 bpm, normal axis, nonspecific ST-T abnormality, cannot exclude anterolateral ischemia.  Compared to 10/21/2022, ST-T changes new in V3-V6. Confirmed by Alyssamarie Mounsey, Jagadeesh ([id number]) on 03/18/2024 4:20:01 PM    Medications ordered    Meds ordered this encounter  Medications   rosuvastatin  (CRESTOR ) 20 MG tablet    Sig: TAKE 1 TABLET(20 MG) BY MOUTH DAILY    Dispense:  90 tablet    Refill:  3    Pt must schedule an overdue followup appt with Cardiology for any more refills. [phone]  1st attempt Thank You   ezetimibe  (ZETIA ) 10 MG tablet    Sig: Take 1 tablet (10 mg total) by mouth daily.    Dispense:  90 tablet    Refill:  3   aspirin  (ASPIRIN  CHILDRENS) 81 MG chewable tablet    Sig: Chew 1 tablet (81 mg total) by mouth daily.   pantoprazole  (PROTONIX ) 40 MG tablet    Sig: Take 1 tablet (40 mg total) by mouth daily.    Dispense:  90 tablet    Refill:  0    Refills to Dr. Alberta Sharps     ASSESSMENT AND PLAN: .      ICD-10-CM   1. Atherosclerosis of both carotid arteries  I65.23 EKG 12-Lead    aspirin  (ASPIRIN  CHILDRENS) 81 MG chewable tablet    2. Pure hypercholesterolemia  E78.00 rosuvastatin  (CRESTOR ) 20 MG tablet    ezetimibe  (ZETIA ) 10 MG tablet    3. Bilateral carotid bruits  R09.89     4. Gastroesophageal reflux disease without esophagitis  K21.9 pantoprazole  (PROTONIX ) 40 MG tablet     Assessment & Plan Coronary Artery Disease with Stent Coronary artery disease with stent placement in 2020. Currently asymptomatic with no chest pain or dyspnea. EKG shows mild nonspecific changes in anterior leads, but no significant concerns. Excellent exercise tolerance with regular swimming. - Continue current cardiac medications including rosuvastatin  and Zetia . - Follow up in one year unless symptoms develop.  Hyperlipidemia Hyperlipidemia well-controlled with current medication regimen. Recent blood work shows excellent cholesterol levels: total cholesterol 135, triglycerides 99, HDL 61, LDL 55. Currently taking Zetia  and has been off rosuvastatin  for three weeks. - Restart rosuvastatin  20 mg daily with 90-day supply and three refills. - Continue Zetia  10 mg daily. - Change aspirin  to 81 mg daily, preferably chewable, to reduce gastrointestinal discomfort.  Constipation Chronic constipation with associated bloating and discomfort. Symptoms may be exacerbated by current use of Dulcolax, causing severe nocturnal abdominal pain. No current use of fiber  supplements like Benefiber or Metamucil. Potential for improvement with Miralax. - Start Miralax daily, one spoon before bed. - Discontinue Dulcolax and Colace. - If symptoms persist, follow up with primary care physician, Dr. Alberta Sharps.  Carpal Tunnel Syndrome Carpal tunnel syndrome with persistent hand pain, especially nocturnal. Pain may be related to previous carpal tunnel surgery and is exacerbated by activities involving hand use. Neurologist has suggested possible atrophy as a cause. - Continue current pain management with Tylenol  as needed. - In spite of patient having stopped Crestor  for 3 to 4 weeks or more, she has not had any resolution of the pain and hence her discomfort is unrelated to statin.  Resume statin therapy.     Signed,  Gordy Bergamo, MD, [facility] 03/18/2024, 4:49 PM [facility]  [address] [place], [place] [address] Phone: [phone]. Fax:  [phone]

## 2024-03-18 NOTE — Patient Instructions (Addendum)
 Medication Instructions:  Your physician has recommended you make the following change in your medication: Start Pantoprazole  40 mg by mouth daily Decrease aspirin  to 81 mg by mouth daily  Resume Rosuvastatin  20 mg by mouth daily   *If you need a refill on your cardiac medications before your next appointment, please call your pharmacy*  Lab Work: none If you have labs (blood work) drawn today and your tests are completely normal, you will receive your results only by: MyChart Message (if you have MyChart) OR A paper copy in the mail If you have any lab test that is abnormal or we need to change your treatment, we will call you to review the results.  Testing/Procedures: none  Follow-Up: At [facility], you and your health needs are our priority.  As part of our continuing mission to provide you with exceptional heart care, our providers are all part of one team.  This team includes your primary Cardiologist (physician) and Advanced Practice Providers or APPs (Physician Assistants and Nurse Practitioners) who all work together to provide you with the care you need, when you need it.  Your next appointment:   12 month(s)  Provider:   Dr Ladona   We recommend signing up for the patient portal called MyChart.  Sign up information is provided on this After Visit Summary.  MyChart is used to connect with patients for Virtual Visits (Telemedicine).  Patients are able to view lab/test results, encounter notes, upcoming appointments, etc.  Non-urgent messages can be sent to your provider as well.   To learn more about what you can do with MyChart, go to [email or web address].   Other Instructions     Constipation, Adult Constipation is when a person has trouble pooping (having a bowel movement). When you have this condition, you may poop fewer than 3 times a week. Your poop (stool) may also be dry, hard, or bigger than normal. Follow these instructions at home: Eating and  drinking  Eat foods that have a lot of fiber, such as: Fresh fruits and vegetables. Whole grains. Beans. Eat less of foods that are low in fiber and high in fat and sugar, such as: [place] fries. Hamburgers. Cookies. Candy. Soda. Drink enough fluid to keep your pee (urine) pale yellow. General instructions Exercise regularly or as told by your doctor. Try to do 150 minutes of exercise each week. Go to the restroom when you feel like you need to poop. Do not hold it in. Take over-the-counter and prescription medicines only as told by your doctor. These include any fiber supplements. When you poop: Do deep breathing while relaxing your lower belly (abdomen). Relax your pelvic floor. The pelvic floor is a group of muscles that support the rectum, bladder, and intestines (as well as the uterus in women). Watch your condition for any changes. Tell your doctor if you notice any. Keep all follow-up visits as told by your doctor. This is important. Contact a doctor if: You have pain that gets worse. You have a fever. You have not pooped for 4 days. You vomit. You are not hungry. You lose weight. You are bleeding from the opening of the butt (anus). You have thin, pencil-like poop. Get help right away if: You have a fever, and your symptoms suddenly get worse. You leak poop or have blood in your poop. Your belly feels hard or bigger than normal (bloated). You have very bad belly pain. You feel dizzy or you faint. Summary Constipation is when a person  poops fewer than 3 times a week, has trouble pooping, or has poop that is dry, hard, or bigger than normal. Eat foods that have a lot of fiber. Drink enough fluid to keep your pee (urine) pale yellow. Take over-the-counter and prescription medicines only as told by your doctor. These include any fiber supplements. This information is not intended to replace advice given to you by your health care provider. Make sure you discuss any  questions you have with your health care provider. Document Revised: 07/09/2022 Document Reviewed: 07/09/2022 Elsevier Patient Education  2024 [employer].

## 2024-03-20 ENCOUNTER — Other Ambulatory Visit: Payer: Self-pay | Admitting: Cardiology

## 2024-03-20 DIAGNOSIS — E7800 Pure hypercholesterolemia, unspecified: Secondary | ICD-10-CM

## 2024-03-21 ENCOUNTER — Other Ambulatory Visit: Payer: Self-pay | Admitting: Cardiology

## 2024-03-21 DIAGNOSIS — I25118 Atherosclerotic heart disease of native coronary artery with other forms of angina pectoris: Secondary | ICD-10-CM

## 2024-03-28 DIAGNOSIS — F330 Major depressive disorder, recurrent, mild: Secondary | ICD-10-CM | POA: Diagnosis not present

## 2024-03-30 ENCOUNTER — Ambulatory Visit: Admitting: Neurology

## 2024-03-30 ENCOUNTER — Encounter: Payer: Self-pay | Admitting: Neurology

## 2024-03-30 VITALS — BP 118/56 | HR 72 | Ht 61.0 in | Wt 146.4 lb

## 2024-03-30 DIAGNOSIS — Z9181 History of falling: Secondary | ICD-10-CM | POA: Diagnosis not present

## 2024-03-30 DIAGNOSIS — R2689 Other abnormalities of gait and mobility: Secondary | ICD-10-CM | POA: Diagnosis not present

## 2024-03-30 DIAGNOSIS — R635 Abnormal weight gain: Secondary | ICD-10-CM | POA: Diagnosis not present

## 2024-03-30 DIAGNOSIS — F0390 Unspecified dementia without behavioral disturbance: Secondary | ICD-10-CM

## 2024-03-30 DIAGNOSIS — R4189 Other symptoms and signs involving cognitive functions and awareness: Secondary | ICD-10-CM | POA: Diagnosis not present

## 2024-03-30 NOTE — Patient Instructions (Signed)
 Management of Memory Problems  There are some general things you can do to help manage your memory problems.  Your memory may not in fact recover, but by using techniques and strategies you will be able to manage your memory difficulties better.  1)  Establish a routine. Try to establish and then stick to a regular routine.  By doing this, you will get used to what to expect and you will reduce the need to rely on your memory.  Also, try to do things at the same time of day, such as taking your medication or checking your calendar first thing in the morning. Think about think that you can do as a part of a regular routine and make a list.  Then enter them into a daily planner to remind you.  This will help you establish a routine.  2)  Organize your environment. Organize your environment so that it is uncluttered.  Decrease visual stimulation.  Place everyday items such as keys or cell phone in the same place every day (ie.  Basket next to front door) Use post it notes with a brief message to yourself (ie. Turn off light, lock the door) Use labels to indicate where things go (ie. Which cupboards are for food, dishes, etc.) Keep a notepad and pen by the telephone to take messages  3)  Memory Aids A diary or journal/notebook/daily planner Making a list (shopping list, chore list, to do list that needs to be done) Using an alarm as a reminder (kitchen timer or cell phone alarm) Using cell phone to store information (Notes, Calendar, Reminders) Calendar/White board placed in a prominent position Post-it notes  In order for memory aids to be useful, you need to have good habits.  It's no good remembering to make a note in your journal if you don't remember to look in it.  Try setting aside a certain time of day to look in journal.  4)  Improving mood and managing fatigue. There may be other factors that contribute to memory difficulties.  Factors, such as anxiety, depression and tiredness can  affect memory. Regular gentle exercise can help improve your mood and give you more energy. Simple relaxation techniques may help relieve symptoms of anxiety Try to get back to completing activities or hobbies you enjoyed doing in the past. Learn to pace yourself through activities to decrease fatigue. Find out about some local support groups where you can share experiences with others. Try and achieve 7-8 hours of sleep at night.Memory Compensation Strategies  Use WARM strategy.  W= write it down  A= associate it  R= repeat it  M= make a mental note  2.   You can keep a Glass blower/designer.  Use a 3-ring notebook with sections for the following: calendar, important names and phone numbers,  medications, doctors' names/phone numbers, lists/reminders, and a section to journal what you did  each day.   3.    Use a calendar to write appointments down.  4.    Write yourself a schedule for the day.  This can be placed on the calendar or in a separate section of the Memory Notebook.  Keeping a  regular schedule can help memory.  5.    Use medication organizer with sections for each day or morning/evening pills.  You may need help loading it  6.    Keep a basket, or pegboard by the door.  Place items that you need to take out with you in the basket  or on the pegboard.  You may also want to  include a message board for reminders.  7.    Use sticky notes.  Place sticky notes with reminders in a place where the task is performed.  For example:  turn off the  stove placed by the stove, lock the door placed on the door at eye level,  take your medications on  the bathroom mirror or by the place where you normally take your medications.  8.    Use alarms/timers.  Use while cooking to remind yourself to check on food or as a reminder to take your medicine, or as a  reminder to make a call, or as a reminder to perform another task, etc. Problems With Thinking and Memory (Mild Neurocognitive  Disorder): What to Know Mild neurocognitive disorder, formerly known as mild cognitive impairment, is a disorder where your memory doesn't work as well as it should. It may also affect other mental abilities like thinking, communicating, behavior, and being able to finish tasks. These problems can be noticed and measured. But they usually don't stop you from doing daily activities or living on your own. Mild neurocognitive disorder usually happens after 73 years of age. But it can also happen at younger ages. It's not as serious as major neurocognitive disorder, also known as dementia, but it may be the first sign of it. In general, the symptoms of this condition get worse over time. In rare cases, symptoms can get better. What are the causes? This condition may be caused by: Brain disorders like Alzheimer's disease, Parkinson's disease, and other conditions that slowly damage nerve cells. Diseases that affect the blood vessels in the brain and cause small strokes. Certain infections, like HIV. Traumatic brain injury. Other medical conditions, such as brain tumors, underactive thyroid (hypothyroidism), and not having enough vitamin B12. Using certain drugs or medicines. What increases the risk? Being older than 72 years of age. Being female. Having a lower level of education. Diabetes, high blood pressure, high cholesterol, and other conditions that raise the risk for blood vessel diseases. Untreated or undertreated sleep apnea. Having a certain type of gene that can be inherited, or passed down from parent to child. Long-term health problems like heart disease, lung disease, liver disease, kidney disease, or depression. What are the signs or symptoms? Trouble remembering things. You may: Forget names, phone numbers, or details of recent events. Forget about social events and appointments. Often forget where you put your car keys or other items. Trouble thinking and solving problems. You may  have trouble with complex tasks like: Paying bills. Driving in places you don't know well. Trouble communicating. You may have trouble: Finding the right word or naming an object. Forming a sentence that makes sense. Understanding what you read or hear. Changes in your behavior or personality. When this happens, you may: Lose interest in the things you used to enjoy. Avoid being around people. Get angry more easily than usual. Act before thinking. How is this diagnosed? This condition is diagnosed based on: Your symptoms. Your health care provider may ask you and the people you spend time with, like family and friends, about your symptoms. Memory tests and other tests to check how your brain is working. Your provider may refer you to a provider called a neurologist or a mental health specialist. To try to find out the cause of your condition, your provider may: Get a detailed medical history. Ask about use of alcohol, drugs, and medicines. Do  a physical exam. Order blood tests and brain imaging tests. How is this treated? Mild neurocognitive disorder that's caused by medicine use, drug use, infection, or another medical condition may get better when the cause is treated, or when medicines or drugs are stopped. If this disorder has another cause, it usually doesn't improve and may get worse. In these cases, the goal of treatment is to help you manage the symptoms. This may include: Medicines to help with memory and behavior symptoms. Talk therapy. This provides education, emotional support, memory aids, and other ways of making up for problems with mental tasks. Lifestyle changes. These may include: Getting regular exercise. Eating a healthy diet that includes omega-3 fatty acids. Doing things to challenge your thinking and memory skills. Spending more time being with and talking to other people. Using routines like having regular times for meals and going to bed. Follow these  instructions at home: Eating and drinking  Drink more fluids as told. Eat a healthy diet that includes omega-3 fatty acids. These can be found in: Fish. Nuts. Leafy vegetables. Vegetable oils. If you drink alcohol: Limit how much you have to: 0-1 drink a day if you're female. 0-2 drinks a day if you're female. Know how much alcohol is in your drink. In the [place] one drink is one 12 oz bottle of beer (355 mL), one 5 oz glass of wine (148 mL), or one 1 oz glass of hard liquor (44 mL). Lifestyle  Get regular exercise as told by your provider. Do not smoke, vape, or use nicotine or tobacco. Use healthy ways to manage stress. If you need help managing stress, ask your provider. Keep spending time with other people. Keep your mind active by doing activities you enjoy, like reading or playing games. Make sure you get good sleep at night. These tips can help: Try not to take naps during the day. Keep your bedroom dark and cool. Do not exercise in the few hours before you go to bed. Do not have foods or drinks with caffeine at night. General instructions Take medicines only as told. Your provider may tell you to avoid taking medicines that can affect thinking. These include some medicines for pain or sleeping. Work with your provider to find out: What things you need help with. What your safety needs are. Where to find more information [employer] on Aging: [email or web address] Contact a health care provider if: You have any new symptoms. Get help right away if: You have new confusion or your confusion gets worse. You act in ways that put you or your family in danger. This information is not intended to replace advice given to you by your health care provider. Make sure you discuss any questions you have with your health care provider. Document Revised: 02/17/2023 Document Reviewed: 02/17/2023 Elsevier Patient Education  2024 [employer].

## 2024-03-30 NOTE — Progress Notes (Addendum)
 [facility]  Provider:  Dr Rayme Bui Referring Provider: Claudene Pellet, MD Primary Care Physician:  Claudene Pellet, MD  Chief Complaint  Patient presents with   Memory Loss    RM 1, Pt w/spouse. Pt referred for memory issues. Pt states she feels it is hard for her to get into a routine to make her day easier.    HPI:  Diane Richardson is a 73 y.o. female and  has a masters degree, retired runner, broadcasting/film/video, seen here on 03/30/2024 upon referral from Dr. Tasia  for a Consultation/ Evaluation of  neurocognitive disorder. I am meeting this established patient today to discuss testing results from Dr Authur and Dr Luevenia notes, asking for a memory specific evaluation and  to consider a neurocognitive disorder.   Here with husband :  Slowed thinking and speaking.  Quoted were slowed semantic understanding and memory.  Multitasking is difficult.  She has been approached by scammers.   Subjective memory issues: Pt states she feels it is hard for her to get into a routine to make her day easier. she  then proceeded to tell me she can drive, and  is reporting enjoying taking care of her horses. She is reporting subjective  little deficits, in contrast to her family and her psychiatrist.  She endorsed on the ADL questionnaire  independence in all areas, whereas her husband clearly stated she has been limited in her financial decision making, her communication skills have declined and she does not plan her own day, nor follows routine sleep and wake times, meal times.   She reports weight gain.   (ADL; she drives still a car, but only in familiar and residential area. Finances have been taken over by her husband - after she got deeply involved in online scam activity).    MOCA:  A nurse took today a Montreal cognitive assessment test over a duration of over 20 minutes.  The patient did have visual-spatial difficulties she drew the clock face which was not quite round and she placed the  numbers in equal spacing around the clock face but she drew instead of 10 of the 11:00 towards 10 before 11.  The cube instead of pointing to the front right is pointing to the front left when she called with the image the Trail Making Test was broken off after she connected all numbers but did not connect any of the letters.    On the other hand naming 4 animals was intact, the reading of the digits forward and backwards was intact, the serial 7 only went to the second step.  She repeated the sentences correctly.  She achieved to name 10 words beginning with the letter F instead of 11 abstraction was intact, delayed recall was impaired not one of the 5 words was recalled.   On the other hand the patient was oriented to month year day of the week place and city.  ADLs were independent except for cooking, but managing finances and transportation have been assisted by her husband or her daughter, communication including phone use has been limited.  So instrumental activities also free after being affected.  So so what I would like to achieved today is to draw the lab tests for biomarkers, I will also look if we need to repeat MRI scans if an MRI scan is older than 2 years I would definitely want to repeated.  My other goal will be depending on the biomarkers if needed to add PET scans these will  look at either dopamine distribution, at amyloid deposition, or at metabolic rates of different brain areas.  That can help to further differentiate different forms of neurocognitive function loss.  There is impairment in a variety of cognitive domains.   Here today to start process of obtaining biomarkers, MRI - possibly PET scan depending on bio markers.   Review of Systems: Out of a complete 14 system review, the patient complains of only the following symptoms, and all other reviewed systems are negative.    Social History   Socioeconomic History   Marital status: Married    Spouse name: Not on file    Number of children: 1   Years of education: PHD   Highest education level: Not on file  Occupational History   Occupation: disabled    Comment: FORMER TEACHER  Tobacco Use   Smoking status: Never   Smokeless tobacco: Never  Vaping Use   Vaping status: Never Used  Substance and Sexual Activity   Alcohol use: Yes    Alcohol/week: 3.0 standard drinks of alcohol    Types: 2 Glasses of wine, 1 Standard drinks or equivalent per week    Comment: 3 drinks/week   Drug use: Never   Sexual activity: Yes  Other Topics Concern   Not on file  Social History Narrative   Drinks less than one cup of caffeine daily.   Social Drivers of Corporate Investment Banker Strain: Low Risk  (03/22/2019)   Overall Financial Resource Strain (CARDIA)    Difficulty of Paying Living Expenses: Not hard at all  Food Insecurity: No Food Insecurity (03/22/2019)   Hunger Vital Sign    Worried About Running Out of Food in the Last Year: Never true    Ran Out of Food in the Last Year: Never true  Transportation Needs: No Transportation Needs (05/01/2022)   PRAPARE - Administrator, Civil Service (Medical): No    Lack of Transportation (Non-Medical): No  Physical Activity: Insufficiently Active (03/22/2019)   Exercise Vital Sign    Days of Exercise per Week: 4 days    Minutes of Exercise per Session: 30 min  Stress: Stress Concern Present (03/22/2019)   [employer] of Occupational Health - Occupational Stress Questionnaire    Feeling of Stress : To some extent  Social Connections: Not on file  Intimate Partner Violence: Not on file    Family History  Problem Relation Age of Onset   Cancer Mother        ovarian    Peripheral vascular disease Mother 88   Hyperlipidemia Father    Hypertension Father    Multiple myeloma Father    CAD Father        CABG age 19   Heart disease Father        Pacer age 20   Diabetes Maternal Grandmother    Cancer Maternal Grandmother        stomach    Arthritis Brother    Dementia Maternal Grandfather    Colon polyps Maternal Grandfather    CAD Paternal Grandmother    Diabetes Paternal Grandmother    Dementia Paternal Grandfather     Past Medical History:  Diagnosis Date   ADD (attention deficit disorder)    Anxiety    Coronary artery disease    Depression    DVT (deep venous thrombosis) (HCC)    Fatigue    chronic   Fibromyalgia    Hyperlipidemia    Muscle spasm  bad   PE (pulmonary embolism)    PTSD (post-traumatic stress disorder)     Past Surgical History:  Procedure Laterality Date   BUNIONECTOMY     CARDIAC CATHETERIZATION     CATARACT EXTRACTION  01/2020   Right eye   childbirth  1989   x1,NVD   CORONARY STENT INTERVENTION N/A 11/23/2018   Procedure: CORONARY STENT INTERVENTION;  Surgeon: Ladona Heinz, MD;  Location: [facility] INVASIVE CV [facility];  Service: Cardiovascular;  Laterality: N/A;  OM   ELBOW SURGERY Right 09/05/2022   FOOT SURGERY Right    pt states she had bunion surgery right foot.   HAND SURGERY     LEFT HEART CATH AND CORONARY ANGIOGRAPHY N/A 11/23/2018   Procedure: LEFT HEART CATH AND CORONARY ANGIOGRAPHY;  Surgeon: Ladona Heinz, MD;  Location: [facility] INVASIVE CV LAB;  Service: Cardiovascular;  Laterality: N/A;   PULMONARY EMBOLISM SURGERY  1983   x9 days    Current Outpatient Medications  Medication Sig Dispense Refill   aspirin  (ASPIRIN  CHILDRENS) 81 MG chewable tablet Chew 1 tablet (81 mg total) by mouth daily.     bismuth subsalicylate (PEPTO BISMOL) 262 MG/15ML suspension Take 30 mLs by mouth every 6 (six) hours as needed for indigestion.     Cholecalciferol (VITAMIN D ) 50 MCG (2000 UT) CAPS Take 2,000 Units by mouth daily.     Coenzyme Q10 (COQ-10) 200 MG CAPS Take 200 mg by mouth daily.     DULoxetine  (CYMBALTA ) 60 MG capsule Take 60 mg by mouth daily. (Patient taking differently: Take 120 mg by mouth daily.)     ezetimibe  (ZETIA ) 10 MG tablet Take 1 tablet (10 mg total) by mouth daily. 90 tablet 3    LORazepam  (ATIVAN ) 0.5 MG tablet Take 1 tablet (0.5 mg total) by mouth 2 (two) times daily. (Patient taking differently: Take 0.5 mg by mouth See admin instructions. Take 0.5 mg in the morning, may take a second 0.5 mg dose as needed for anxiety) 30 tablet 0   Magnesium  300 MG CAPS 1 capsule with a meal     Menthol-Zinc Oxide (GOLD BOND EX) Apply 1 application topically daily as needed (pain/itching).     metoprolol  succinate (TOPROL -XL) 25 MG 24 hr tablet Take 1 tablet (25 mg total) by mouth at bedtime. Please call [phone] to schedule a June appointment for future refills. (Patient taking differently: Take 12.5 mg by mouth in the morning and at bedtime.) 60 tablet 0   nitroGLYCERIN  (NITROSTAT ) 0.4 MG SL tablet DISSOLVE 1 TABLET UNDER THE TONGUE EVERY 5 MINUTES AS NEEDED FOR CHEST PAIN 25 tablet 3   pantoprazole  (PROTONIX ) 40 MG tablet Take 1 tablet (40 mg total) by mouth daily. 90 tablet 0   QUEtiapine  (SEROQUEL ) 100 MG tablet Take 300 mg by mouth at bedtime.     rosuvastatin  (CRESTOR ) 20 MG tablet TAKE 1 TABLET(20 MG) BY MOUTH DAILY 90 tablet 3   tizanidine (ZANAFLEX) 2 MG capsule Take 2 mg by mouth 3 (three) times daily as needed for muscle spasms.     No current facility-administered medications for this visit.    Allergies as of 03/30/2024 - Review Complete 03/30/2024  Allergen Reaction Noted   Penicillins Hives and Diarrhea 01/23/2013   Other Other (See Comments) 11/19/2022   Oxycodone-acetaminophen  Other (See Comments) 11/19/2022   Codeine Hives and Diarrhea 02/03/2013   Donnatal [belladonna alk-phenobarb er] Rash 02/03/2013   Percocet [oxycodone-acetaminophen ] Other (See Comments) 02/03/2013    Vitals: BP (!) 118/56 (BP Location: Left Arm,  Patient Position: Sitting, Cuff Size: Normal)   Pulse 72   Ht 5' 1 (1.549 m)   Wt 146 lb 6.4 oz (66.4 kg)   LMP  (LMP Unknown)   BMI 27.66 kg/m   @No  data found.     @VITALSLAST3  [[id number]]@ Physical exam:  General: The patient  is awake, alert and appears not in acute distress.  The patient is well groomed. Head: Normocephalic, atraumatic.  Neck is supple. Cardiovascular:  Regular rate and palpable peripheral pulse:  Respiratory: clear to auscultation.  Mallampati, Skin:  With evidence of edema, or rash Trunk:  obese    Neurologic exam : The patient is awake and alert,  but appears apathic , she  is oriented to place and time.   Memory subjective  described as intact.     03/30/2024    3:39 PM 04/24/2016    2:48 PM 10/16/2014   12:00 PM  Montreal Cognitive Assessment   Visuospatial/ Executive (0/5) 2 5 4   Naming (0/3) 3 3 3   Attention: Read list of digits (0/2) 2 2 2   Attention: Read list of letters (0/1) 1 1 1   Attention: Serial 7 subtraction starting at 100 (0/3) 2 2 3   Language: Repeat phrase (0/2) 2 2 2   Language : Fluency (0/1) 0 1 1  Abstraction (0/2) 2 2 2   Delayed Recall (0/5) 0 4 5  Orientation (0/6) 5 6 6   Total 19 28 29     There is a limit attention span & concentration ability.  Speech is hesitant , slow fluent with dysphonia or aphasia.  Mood and affect are depressed   Cranial nerves: NORMAL SENSE OF SMELL.  Pupils are equal and briskly reactive to light.   Extraocular movements  in vertical and horizontal planes intact and without nystagmus. Visual fields by finger perimetry are intact. Hearing to finger rub intact.  Facial sensation intact to fine touch.  Facial motor strength is symmetric and tongue and uvula move midline. FACIAL masking.   Motor exam:   Normal tone and normal muscle bulk and symmetric normal strength in all extremities. Grip Strength equal . Proximal strength of shoulder muscles and hip flexors was normal/ intact. .  Sensory:  Fine touch and vibration were tested ND REDUCED IN THE ANKLES.SABRA  Proprioception was tested in the upper extremities only and was  normal.  Coordination: Rapid alternating movements in the fingers/hands were slowed -  Finger-to-nose  maneuver was tested and slow- very slow, but showed no evidence of ataxia, dysmetria or tremor.  Gait and station: Patient walked without assistive device .  Core Strength within normal limits.  The patient took small steps, had arm swing but  was slow- she turned to the right with 3.5 steps and to the left with 4.5 steps.  Toe walk was normal,  Tandem gait was impaired -  clearly  off- very insecure.   Deep tendon reflexes: in the  upper and lower extremities are symmetric/  Addendum :   Assessment: Total time for face to face interview and examination, for review of  images and laboratory testing, neurophysiology testing and pre-existing records, including out-of -network , was 50 minutes. Assessment is as follows here:  Dr Authur noted a pattern in the patients deficits , neurocognitive - that's she felt was organic and likely neurodegenerative in origin.   1)   neurocognitive changes and progressive decline in many aspects over years/ certainly over 18 months, .  2)   slowed motor function, speech and impaired  balance.  3)  peculiar affect, friendly apathy 4)  answers to general questions were off- target, and evolved into unrelated territory. 5)  paranoid ideas were confirmed by husband and denied by patient who feels overall that she has less trouble with memory than her family thinks.   PS :  MOCA showed no deficiency of  visio-spatial function but she reversed images , reversed the hands on the clock face.  Executive function is clearly impaired.   Plan:  Treatment plan:   1)  ATN ,  apolipoprotein testing, genetic .   2) [facility] dementia  lab- panel.   3) MRI brain with and without.  4 ) EEG - Dr Gregg to read.   RV after results are available.  Likely 3 months from now. Not on memory specific medications, has been on Seroquel ,  Ativan , Cymbalta  , and Welllbutrin at the time of this visit.  Wellbutrin has been d/c in October 2025    The patient's condition requires  frequent monitoring and adjustments in the treatment plan, reflecting the ongoing complexity of care.   This provider is the continuing focal point for all needed services for this condition.  Addendum signed on 07-31-2024    Dedra Gores, MD  [facility] Sleep Board certified by The [employer] of Sleep Medicine and Diplomate of the [employer] of Sleep Medicine. Board certified In Neurology through the ABPN, Fellow of the [employer] of Neurology.

## 2024-04-01 ENCOUNTER — Ambulatory Visit: Payer: Self-pay | Admitting: Neurology

## 2024-04-01 ENCOUNTER — Ambulatory Visit: Admitting: *Deleted

## 2024-04-01 DIAGNOSIS — R4189 Other symptoms and signs involving cognitive functions and awareness: Secondary | ICD-10-CM

## 2024-04-01 DIAGNOSIS — F0390 Unspecified dementia without behavioral disturbance: Secondary | ICD-10-CM

## 2024-04-01 DIAGNOSIS — R4182 Altered mental status, unspecified: Secondary | ICD-10-CM | POA: Diagnosis not present

## 2024-04-01 NOTE — Procedures (Signed)
    History:  73 year old man with cognitive impairment   EEG classification: Awake and drowsy  Duration: 28 minutes   Technical aspects: This EEG study was done with scalp electrodes positioned according to the 10-20 International system of electrode placement. Electrical activity was reviewed with band pass filter of 1-70Hz , sensitivity of 7 uV/mm, display speed of 33mm/sec with a 60Hz  notched filter applied as appropriate. EEG data were recorded continuously and digitally stored.   Description of the recording: The background rhythms of this recording consists of a fairly well modulated medium amplitude alpha rhythm of 8 Hz that is reactive to eye opening and closure. Present in the anterior head region is a 15-20 Hz beta activity. Photic stimulation was performed, did not show any abnormalities. Hyperventilation was also performed, did not show any abnormalities. Drowsiness was manifested by background fragmentation. No abnormal epileptiform discharges seen during this recording. There was no focal slowing. There were no electrographic seizure identified.   Abnormality: None   Impression: This is essentially a normal awake and drowsy EEG. No evidence of interictal epileptiform discharges. Normal EEGs, however, do not rule out epilepsy.    Leler Brion, MD [facility]

## 2024-04-03 DIAGNOSIS — M25532 Pain in left wrist: Secondary | ICD-10-CM | POA: Diagnosis not present

## 2024-04-03 DIAGNOSIS — S6992XA Unspecified injury of left wrist, hand and finger(s), initial encounter: Secondary | ICD-10-CM | POA: Diagnosis not present

## 2024-04-06 ENCOUNTER — Telehealth: Payer: Self-pay | Admitting: Neurology

## 2024-04-06 NOTE — Telephone Encounter (Signed)
 Diane Richardson: [id number] exp. 04/06/24-06/05/24 sent to [facility] [phone]

## 2024-04-08 LAB — CBC WITH DIFFERENTIAL/PLATELET
Basophils Absolute: 0 x10E3/uL (ref 0.0–0.2)
Basos: 1 %
EOS (ABSOLUTE): 0.1 x10E3/uL (ref 0.0–0.4)
Eos: 3 %
Hematocrit: 37.7 % (ref 34.0–46.6)
Hemoglobin: 12.3 g/dL (ref 11.1–15.9)
Immature Grans (Abs): 0 x10E3/uL (ref 0.0–0.1)
Immature Granulocytes: 0 %
Lymphocytes Absolute: 1.2 x10E3/uL (ref 0.7–3.1)
Lymphs: 24 %
MCH: 31.3 pg (ref 26.6–33.0)
MCHC: 32.6 g/dL (ref 31.5–35.7)
MCV: 96 fL (ref 79–97)
Monocytes Absolute: 0.4 x10E3/uL (ref 0.1–0.9)
Monocytes: 9 %
Neutrophils Absolute: 3 x10E3/uL (ref 1.4–7.0)
Neutrophils: 63 %
Platelets: 240 x10E3/uL (ref 150–450)
RBC: 3.93 x10E6/uL (ref 3.77–5.28)
RDW: 11.9 % (ref 11.7–15.4)
WBC: 4.7 x10E3/uL (ref 3.4–10.8)

## 2024-04-08 LAB — PROTEIN ELECTROPHORESIS, SERUM
A/G Ratio: 1.7 (ref 0.7–1.7)
Albumin ELP: 4 g/dL (ref 2.9–4.4)
Alpha 1: 0.2 g/dL (ref 0.0–0.4)
Alpha 2: 0.8 g/dL (ref 0.4–1.0)
Beta: 1 g/dL (ref 0.7–1.3)
Gamma Globulin: 0.4 g/dL (ref 0.4–1.8)
Globulin, Total: 2.4 g/dL (ref 2.2–3.9)

## 2024-04-08 LAB — COMPREHENSIVE METABOLIC PANEL WITH GFR
ALT: 13 IU/L (ref 0–32)
AST: 19 IU/L (ref 0–40)
Albumin: 4.4 g/dL (ref 3.8–4.8)
Alkaline Phosphatase: 66 IU/L (ref 44–121)
BUN/Creatinine Ratio: 28 (ref 12–28)
BUN: 21 mg/dL (ref 8–27)
Bilirubin Total: <0.2 mg/dL (ref 0.0–1.2)
CO2: 21 mmol/L (ref 20–29)
Calcium: 9.7 mg/dL (ref 8.7–10.3)
Chloride: 101 mmol/L (ref 96–106)
Creatinine, Ser: 0.76 mg/dL (ref 0.57–1.00)
Globulin, Total: 2 g/dL (ref 1.5–4.5)
Glucose: 93 mg/dL (ref 70–99)
Potassium: 4.8 mmol/L (ref 3.5–5.2)
Sodium: 138 mmol/L (ref 134–144)
Total Protein: 6.4 g/dL (ref 6.0–8.5)
eGFR: 83 mL/min/1.73 (ref >59)

## 2024-04-08 LAB — APOE ALZHEIMER'S RISK

## 2024-04-08 LAB — VITAMIN B12: Vitamin B-12: 282 pg/mL (ref 232–1245)

## 2024-04-08 LAB — METHYLMALONIC ACID, SERUM: Methylmalonic Acid: 184 nmol/L (ref 0–378)

## 2024-04-08 LAB — SEDIMENTATION RATE: Sed Rate: 11 mm/h (ref 0–40)

## 2024-04-08 LAB — HEMOGLOBIN A1C
Est. average glucose Bld gHb Est-mCnc: 105 mg/dL
Hgb A1c MFr Bld: 5.3 % (ref 4.8–5.6)

## 2024-04-08 LAB — TSH+FREE T4
Free T4: 0.8 ng/dL — ABNORMAL LOW (ref 0.82–1.77)
TSH: 1.59 u[IU]/mL (ref 0.450–4.500)

## 2024-04-08 LAB — ANA W/REFLEX: Anti Nuclear Antibody (ANA): NEGATIVE

## 2024-04-08 LAB — HOMOCYSTEINE: Homocysteine: 11.1 umol/L (ref 0.0–19.2)

## 2024-04-08 LAB — HIV ANTIBODY (ROUTINE TESTING W REFLEX): HIV Screen 4th Generation wRfx: NONREACTIVE

## 2024-04-08 LAB — RPR: RPR Ser Ql: NONREACTIVE

## 2024-04-11 DIAGNOSIS — M25532 Pain in left wrist: Secondary | ICD-10-CM | POA: Diagnosis not present

## 2024-04-12 NOTE — Telephone Encounter (Signed)
 Called the pt and reviewed lab work and eeg results. Pt verbalized understanding. Pt had no questions at this time but was encouraged to call back if questions arise.

## 2024-04-13 NOTE — Addendum Note (Signed)
 Addended by: CHALICE SAUNAS on: 04/13/2024 03:10 PM   Modules accepted: Orders

## 2024-04-14 NOTE — Telephone Encounter (Signed)
 Attempted to contact pt home and cell, LVM rq call back.

## 2024-04-14 NOTE — Telephone Encounter (Addendum)
 Pt returned call, informed spouse Borderline low T4, no autoimmune cause identified. Advised that we will need to have her ATN profile drawn as well. She will come into the office 8/8.  I have reordered labs to be completed as future.

## 2024-04-14 NOTE — Addendum Note (Signed)
 Addended by: ROBBERT GOSLING D on: 04/14/2024 04:07 PM   Modules accepted: Orders

## 2024-04-15 ENCOUNTER — Other Ambulatory Visit (INDEPENDENT_AMBULATORY_CARE_PROVIDER_SITE_OTHER): Payer: Self-pay

## 2024-04-15 DIAGNOSIS — F0390 Unspecified dementia without behavioral disturbance: Secondary | ICD-10-CM

## 2024-04-15 DIAGNOSIS — Z0289 Encounter for other administrative examinations: Secondary | ICD-10-CM

## 2024-04-15 DIAGNOSIS — R4189 Other symptoms and signs involving cognitive functions and awareness: Secondary | ICD-10-CM | POA: Diagnosis not present

## 2024-04-19 ENCOUNTER — Other Ambulatory Visit: Payer: Self-pay | Admitting: Neurology

## 2024-04-19 ENCOUNTER — Ambulatory Visit: Payer: Self-pay | Admitting: Neurology

## 2024-04-19 DIAGNOSIS — G309 Alzheimer's disease, unspecified: Secondary | ICD-10-CM

## 2024-04-19 LAB — ATN PROFILE
A -- Beta-amyloid 42/40 Ratio: 0.111 (ref >0.102)
Beta-amyloid 40: 176.01 pg/mL
Beta-amyloid 42: 19.53 pg/mL
N -- NfL, Plasma: 3.17 pg/mL (ref 0.00–6.04)
T -- p-tau181: 1.99 pg/mL — ABNORMAL HIGH (ref 0.00–0.97)

## 2024-04-25 DIAGNOSIS — M25532 Pain in left wrist: Secondary | ICD-10-CM | POA: Diagnosis not present

## 2024-04-25 NOTE — Telephone Encounter (Signed)
 Called the patient and advised of the lab results in regard to the ATN profile completed. Advised Dr Dohmeier would recommend a PET imaging scan to review and asses further. Pt verbalized understanding. Pt had no questions at this time but was encouraged to call back if questions arise.

## 2024-04-25 NOTE — Telephone Encounter (Signed)
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    A metabolic  PET scan will now be ordered to see if a part of the brain is less active than normal.  ----- Message ----- From: Interface, [facility] Lab Results In Sent: 04/18/2024   7:36 AM EDT To: Dedra Gores, MD

## 2024-04-27 ENCOUNTER — Encounter (HOSPITAL_COMMUNITY)

## 2024-05-02 ENCOUNTER — Telehealth: Payer: Self-pay | Admitting: Neurology

## 2024-05-02 DIAGNOSIS — F22 Delusional disorders: Secondary | ICD-10-CM

## 2024-05-02 DIAGNOSIS — G3184 Mild cognitive impairment, so stated: Secondary | ICD-10-CM

## 2024-05-02 NOTE — Telephone Encounter (Signed)
 Her insurance denied the pet scan stating: We need more information about your memory problems. Your doctor needs to send us  notes that show you  were told recently that you have memory loss. We need the date when your memory problems first started. We  need to see that your memory loss has been getting worse for at least six months. We need to know if you have  two types of dementia called Alzheimer's and frontotemporal dementia. Alzheimer's is a disease that slowly  changes how a person thinks, remembers things, and acts. Over time, it can make it hard for them to remember  people, places, or how to do everyday things. Frontotemporal dementia, FTD, is when the front part of your  brain changes. The front of your brain controls how you act, your emotions, and how you speak. We need to see that the way your memory or thinking problems started or got worse makes the doctor think it might be FTD  instead of Alzheimer's. We need to see results from a brain scan, like an MRI or CT scan. An MRI is a type of  camera that uses magnets to take clear pictures inside your body. A CT scan is a computed tomography scan  that takes pictures of the inside of your body from different angles. We need to see if any brain testing, called  neuropsychological testing, was done to check how well you think, remember, and solve problems. We also  need to see the results of a Mini Mental Status Exam, MMSE, or a similar test. This test checks your memory,  thinking, and how well your brain is working. We didn't see this information in your notes.   Her MRI is scheduled for 8/29, if you update your notes I can resubmit the PA when we have the MRI results.

## 2024-05-02 NOTE — Telephone Encounter (Signed)
 I put this on my list to redo after her MRI.

## 2024-05-04 DIAGNOSIS — F99 Mental disorder, not otherwise specified: Secondary | ICD-10-CM | POA: Diagnosis not present

## 2024-05-04 DIAGNOSIS — E785 Hyperlipidemia, unspecified: Secondary | ICD-10-CM | POA: Diagnosis not present

## 2024-05-04 DIAGNOSIS — F319 Bipolar disorder, unspecified: Secondary | ICD-10-CM | POA: Diagnosis not present

## 2024-05-04 DIAGNOSIS — K219 Gastro-esophageal reflux disease without esophagitis: Secondary | ICD-10-CM | POA: Diagnosis not present

## 2024-05-04 DIAGNOSIS — I2510 Atherosclerotic heart disease of native coronary artery without angina pectoris: Secondary | ICD-10-CM | POA: Diagnosis not present

## 2024-05-04 DIAGNOSIS — Z1331 Encounter for screening for depression: Secondary | ICD-10-CM | POA: Diagnosis not present

## 2024-05-04 DIAGNOSIS — M8588 Other specified disorders of bone density and structure, other site: Secondary | ICD-10-CM | POA: Diagnosis not present

## 2024-05-04 DIAGNOSIS — Z0000 Encounter for general adult medical examination without abnormal findings: Secondary | ICD-10-CM | POA: Diagnosis not present

## 2024-05-04 DIAGNOSIS — I6523 Occlusion and stenosis of bilateral carotid arteries: Secondary | ICD-10-CM | POA: Diagnosis not present

## 2024-05-04 DIAGNOSIS — E559 Vitamin D deficiency, unspecified: Secondary | ICD-10-CM | POA: Diagnosis not present

## 2024-05-06 ENCOUNTER — Ambulatory Visit
Admission: RE | Admit: 2024-05-06 | Discharge: 2024-05-06 | Disposition: A | Discharge status: Home / Self Care | Source: Ambulatory Visit | Attending: Neurology | Admitting: Neurology

## 2024-05-06 DIAGNOSIS — R4189 Other symptoms and signs involving cognitive functions and awareness: Secondary | ICD-10-CM | POA: Diagnosis not present

## 2024-05-06 MED ORDER — GADOPICLENOL 0.5 MMOL/ML IV SOLN
6.5000 mL | Freq: Once | INTRAVENOUS | Status: AC | PRN
  Administered 2024-05-06: 6.5 mL via INTRAVENOUS

## 2024-05-10 ENCOUNTER — Ambulatory Visit: Payer: Self-pay | Admitting: Cardiology

## 2024-05-10 NOTE — Telephone Encounter (Signed)
 I need to redo this again and leave off amyloid-withdraw the one on file they said the CPT code is wrong.

## 2024-05-10 NOTE — Progress Notes (Signed)
 Labs 05/04/2024:  Total cholesterol 254, triglycerides 97, HDL 82, LDL 155.  Non-HDL cholesterol 172.

## 2024-05-10 NOTE — Telephone Encounter (Signed)
 I resubmitted the MRI prior authorization: Tracking #[id number]

## 2024-05-11 NOTE — Telephone Encounter (Signed)
 I resubmitted the pet scan PA again with different info and will see what they say. Tracking #[id number]  Her husband came into the office after they called this morning with the denial letter and asked Olivia to give us  a copy of it. She let him know that we are working on resubmitting the PA to her insurance and see if they will approve it and let them know.

## 2024-05-11 NOTE — Telephone Encounter (Signed)
 Patient said insurance denied to cover the scan. Want to make sure Dr. Chalice has been notified and what needs to be done.

## 2024-05-12 NOTE — Telephone Encounter (Signed)
 Called the patient and reviewed the results from the MRI brain with her. Pt is still having authorization issues regarding PET imaging. Advised once we have approval on that someone will be in touch to schedule and we will call with those results.

## 2024-05-12 NOTE — Telephone Encounter (Signed)
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 This has progressed compared to the CT scan from 2021.   This dominantly  medial temporal brain atrophy can be associated with cognitive decline, STM loss but is not diagnostic for dementia.  Further testing  (based on the negative amyloid AD bio-marker) by pending metabolic PET scan is needed.  ----- Message ----- From: Vear Charlie LABOR, MD Sent: 05/07/2024   5:29 PM EDT To: Dedra Gores, MD

## 2024-05-16 NOTE — Telephone Encounter (Signed)
 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 She has no positive genetic marker for late onset AD either.

## 2024-05-16 NOTE — Telephone Encounter (Signed)
 Her insurance won't approve the pet metabolic because they denied it already in the last 65 days. We can wait until then to try again or try a pet amyloid. There's no peer to peer or appeal options.

## 2024-05-26 NOTE — Telephone Encounter (Signed)
 I spoke to the patient's husband and explained that we will resubmit the pet scan PA on October 28 and see if they will approve it. I said that I could make her a follow up appointment with Dr. Chalice if they would like, but she recommends waiting until after we see if we can get the pet scan done. He understood agreed to this. No follow-up made at this time.

## 2024-06-13 DIAGNOSIS — F0632 Mood disorder due to known physiological condition with major depressive-like episode: Secondary | ICD-10-CM | POA: Diagnosis not present

## 2024-06-13 DIAGNOSIS — F330 Major depressive disorder, recurrent, mild: Secondary | ICD-10-CM | POA: Diagnosis not present

## 2024-07-14 NOTE — Telephone Encounter (Signed)
 I tried resubmitted the pet scan prior authorization today. Tracking [id number]

## 2024-07-20 ENCOUNTER — Telehealth: Payer: Self-pay | Admitting: Neurology

## 2024-07-20 NOTE — Telephone Encounter (Signed)
 Pt's husband called and requested a call back but would not say why he wanted the call back. I informed him that I would need just a basic idea of why he was needing the call back so that I can inform the nurse so she would be prepared with an answer for him and he stated that the provider would know. I again asked to give an idea and he said just put in your note that its about the Alzheimer's. Please advise.

## 2024-07-20 NOTE — Telephone Encounter (Signed)
 This has been denied again stating that the last denial was on 9/9 so it hasn't been 65 days since then so maybe we can try again in a couple of months.

## 2024-07-21 NOTE — Telephone Encounter (Signed)
 I called the husband back. He wanted an update on the pet scan auth. I let him know it was denied again unfortunately and they said the last denial was 9/9 and we had to wait 65 days. The pt's husband is concerned and asked if there is anything that can be done because something is wrong with his wife. He is asking if there is someone he can call. I told him I wish I had an answer at this moment for him but I would send out a message to see. He verbalized appreciation.

## 2024-07-21 NOTE — Telephone Encounter (Signed)
 I called the pt's husband back. I will document this in the pet scan phone note.

## 2024-07-21 NOTE — Telephone Encounter (Signed)
 I counted on the calendar and today is 65 days from 9/9 so it's too late to try an appeal and maybe I can try again next week. The denial from 9/9 states: After looking at the rules, our doctor has found that your request can't be approved. We need doctor notes showing that you have had memory problems for at least six (6) months. We need test  results that show you might have both Alzheimer's disease and frontotemporal dementia. Alzheimer's disease is  a sickness that hurts the brain. It makes it hard for people to remember things and think clearly. Frontotemporal  dementia is a brain sickness that changes how a person acts and talks. It can make someone say or do things that seem strange or mean, even if they didn't act that way before. We also need notes showing that your doctor  thinks frontotemporal dementia might be causing your memory problems instead of other brain diseases. We  need your memory test scores, like the Mini Mental State Exam or similar tests. We need the date when your  symptoms first started. We also need any brain and memory testing results that were done.   This was after I had sent them the office visit notes, the MRI results and labs.

## 2024-07-21 NOTE — Addendum Note (Signed)
 Addended by: CHALICE SAUNAS on: 07/21/2024 06:07 PM   Modules accepted: Orders

## 2024-07-26 NOTE — Telephone Encounter (Signed)
 I tried submitted the pet scan again to [facility]. Tracking #[id number]

## 2024-07-28 NOTE — Telephone Encounter (Signed)
 After submitting everything we have, Her insurance is requesting:  The most recent office visit note(s) in reference to the submitted authorization  Date of onset of symptoms  MoCA/MMSE score  Medications for memory loss

## 2024-08-01 ENCOUNTER — Telehealth: Payer: Self-pay | Admitting: *Deleted

## 2024-08-01 NOTE — Telephone Encounter (Signed)
 Received neuropsych evaluation to Dr. Chalice to review.

## 2024-08-01 NOTE — Telephone Encounter (Signed)
 Pt notes from Dr Andriette Renfroe in nurse pod.

## 2024-08-03 NOTE — Telephone Encounter (Signed)
 I cannot see a reason for [employer] to deny the test we ordered, based on her cognitive test in the office and Dr Renfroe's documentation, we are looking for an organic neurodegenerative  disorder.   I hope the addendum on her visit this summer helps to get PET scan through.    Cc Richerd Lukes   Cc PCP Alberta Sharps, MD

## 2024-08-15 DIAGNOSIS — E538 Deficiency of other specified B group vitamins: Secondary | ICD-10-CM | POA: Diagnosis not present

## 2024-08-19 ENCOUNTER — Encounter: Payer: Self-pay | Admitting: Psychology

## 2024-08-22 NOTE — Telephone Encounter (Signed)
 I received a voice mail from [place] at the [facility] appeal and grievances department that my appeal was accepted and the denial was overturned.  shara: [id number] exp. 07/26/24-11/17/24  I let Jolynn Pack know they can call her to schedule the pet scan. It doesn't show as updated online but they said we will received a new approval letter in the mail.

## 2024-09-06 ENCOUNTER — Encounter (HOSPITAL_COMMUNITY): Admission: RE | Admit: 2024-09-06 | Source: Ambulatory Visit

## 2024-09-06 DIAGNOSIS — G309 Alzheimer's disease, unspecified: Secondary | ICD-10-CM | POA: Diagnosis present

## 2024-09-06 LAB — GLUCOSE, CAPILLARY: Glucose-Capillary: 92 mg/dL (ref 70–99)

## 2024-09-06 MED ORDER — FLUDEOXYGLUCOSE F - 18 (FDG) INJECTION
10.0000 | Freq: Once | INTRAVENOUS | Status: AC | PRN
  Administered 2024-09-06: 9.96 via INTRAVENOUS

## 2024-09-13 ENCOUNTER — Ambulatory Visit: Payer: Self-pay | Admitting: Neurology

## 2024-09-21 NOTE — Telephone Encounter (Signed)
 Contacted pt, spoke to spouse per DPR. Advised Normal brain metabolism, no asymmetry or focal decrease.  This is a normal brain PET scan. He verbally understood and was appreciative. Advised to call the office back with any questions or concerns as he had none at this time and was appreciative.

## 2024-10-13 ENCOUNTER — Encounter: Admitting: Psychology

## 2024-10-13 NOTE — Progress Notes (Incomplete)
 "  NEUROPSYCHOLOGICAL EVALUATION [place]  Physical Medicine and Rehabilitation     Patient: Diane Richardson  DOB: 1951/05/08  Age: 74 y.o. Sex: female  Race/Ethnicity: White or Caucasian *** Years of Ed.: ***  Collateral Source: ***  Referring Provider: Dohmeier, Dedra, MD Provider / Neuropsychologist: Evalene DOROTHA Riff, PsyD  Date of Service: @DOS @ Start: *** End: *** Location:  [facility] Physical Medicine & Rehabilitation Department [address], [place], [place] [address] Billing Code/Service: [phone] (1 Unit), [phone] (1 Unit) 1 hour and 15 minutes spent in face-to-face clinical interview and remaining 45 minutes was spent in record review, documentation, and testing protocol construction.   Individuals Present: The patient was seen by the provider, in-person, in the provider's outpatient office. The patient was ***  PATIENT CONSENT AND CONFIDENTIALITY The patient's understanding of the reason for referral was intact. Discussed limits of confidentiality including, but not limited to, posting of final evaluation report in the patient's electronic medical record for both the patient and for the referring provider and appropriate medical professionals. Patient was given the opportunity to have their questions answered. The neuropsychological evaluation process was discussed with the patient and they consented to proceed with the evaluation.  Consent for Evaluation and Treatment: Signed: Yes Explanation of Privacy Policies: Signed: Yes Discussion of Confidentiality Limits: Yes  REASON FOR REFERRAL & RECORD REVIEW The patient was referred for neuropsychological evaluation by ***   Upon interview, the patient ***    HISTORY OF PRESENTING CONCERNS:  Cognitive Symptom Onset & Course:  Patient endorsed changes in cognition, maybe ten years. Definitely five   Collateral: Offers maybe related to prior.... had a  problems 2014, she thought people were trying to harm the family... hospitalized twice. The psychiatrist at the time put her on a medication that began with an A, had a bad reaction, could hardly walk.   Dr. Tasia eventually got her off that, also within 2014. Parkinonism....   It was this one medication. Resolved after discontinuaing the medication.   Not sure if she returned ot baseline.   Since then she has had memory issues, he thinks started before she took the medication. Maybe medication exacerbated it.   Probably was gradual onset. Because I didn't notice that until really sick... around 2014 was when became noticed.   Not clearly progressing cognitively since that time.   Trouble short term memory has been very steady since that...   Steady for about ten years.   Last couple months has been a little bit better, but still has periods when she goes into ... (I think sometimes gets confused and can't remember, theres several events causing me frustration, and dont get stuff done that I want to get done, ....   Any changes in the last couple months.   Used ot like to have glass or twoof wine before going to bed...  Kinda let it go. Wasn't like it bothered me. Glas or two a night, usually just in evening .SABRA... Always helped me go to sleep  But Dr Santos put me on other medicine that seems to helped me take care of that.   Good days/bad days?   Retired early due to fibromyalgia. 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 Was worried would make a mistake.   Current Cognitive Complaints:  Memory:  Patient: Remembering to close the gate to  keep the dog out of the room. Details of conversations sometimes lost. Suspects would remember past events, if not details, well. Takes medication on own. Has pill organizer but doesn't use. Able to remember to take medications alright. Help from spouse with medical  appointments from spouse since 2014. What part most challenging. Thinks problems with remembering details, and possibly with remembering the appointments. Is able to drive. Lost in familiar places, couple years ago, one occasion.   Collateral: Little more than once a week with conversations. For most part is okay with recent events. No problems with medications.    Processing Speed:  Patient: Endorsed slowed processing. Depends on what it is.  Collateral: Endorsed.    Attention & Concentration: Patient: Attention and concentration varies depend on level of interest. Always been a bit like that. 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 Can get hyperfocused at times.  Collateral: Little trouble with attention and concentration. Doesn't feel like it is necessarily lifelong.  Language:  Patient: No frequent word finding trouble. No changes in understanding.  Collateral: Not noticing significant word finding. Maybe a little more difficult with processing.  Visual-Spatial:  Patient: Notice if picture cooked.  Collateral: No concerns.    Executive Functioning:  Patient: Trouble staying organized, which has been that way for a while. 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  Collateral: No impulsivity. Regarding personality or behavior, one thing that has changed is with respect to the political stuff she gets involved with. Daughter and husband concerned a bit no marked decline in judgement, but some increased believability. No social comportment.     Motor/Sensory Complaints:  Sensory changes: Hasn't had hearing checked for quite a while. Maybe some hearing. Few years ago, lenses implacted to correct cataract, vions good. No changes smell or taste.   Balance/coordination difficulties: Several falls, ten years ago even. Does ride horses. Two falls in the last year. One hit the head. Didn't see stars,  little headache, about a month ago.  Frequent instances of dizziness/vertigo: No dizzi or lightheadad freauently.  Other motor difficulties: No tremors.   Emotional and Behavioral Functioning:  Psychiatry, medication for treatment of. Medicine for bipolar disorder, not taking that though. Side effects were the issue. Depression, and bipolar.   Dr. Bonnielee was trying to treat fibromyalgia, pain. Meds got out of control, and that's what led to the first hosptilization.  Was maybe a couple years before 2014. Hostpiliation for psychosis. Once in [place] and once here. Both for the same thing.   Things been stable overall.  Depression Symptomatology: Not current, not sad most of the time. No Anhedonia. Not suicidal, sometimes feel like want to run away - Angery about apps. Mad a long time.  Anxiety Symptomatology:  Having mike around more helps. 2017 he reited. No difficulty relaxing. Maybe a little trouble often feeling tense. Not generalized worry.  Other Symptomatology: Haven't had a panic attack in a while. Not for a long time.   No suiide attempt.  No Hi.  AH/VH? For and while hosptilized. Nothing for last 10 years.   Nothing earlier in life.   History of trauma. Most of time was staying with family because mom's mental health.   Was in individual therapy, until Dr. Bonnielee. About 4 years ago did connect with another therapist, but didn't feel was a good fit.   Sleep: 9-10 now, sometimes a little  slow getting going in the morning. Mild issues falling asleep. No problems returning. No RBDs. No sleep apnea.  Appetite: Appetite not suggestive of psych.  Caffeine: Usually a cup, but maybe.   Alcohol Use: No Heavy drinking.  Tobacco Use: No.  Recreational Substance Use: No.    Academic/Vocational History: No clear learning difficulties. Primarily A and B student. Went to college was a little. Masters degree.   Psychosocial: Marital Status: 45   Children/Grandchildren: one daughter. No  grandchildren.    Living Situation: Two in the home with dog. And cat.    Daily Activities/Hobbies: ***    Was lifelong exercising. Maybe not a lot of exercise the last month or so, prior to that was swimming ocnsistently.  No clear mania episodes.   Level of Functional Independence: The patient is *** with basic activities of daily living.  Finances: Husband took over until 2012.    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     Medical History/Record Review: Per records and patient report, History of traumatic brain injury/concussion: No LOC head injury. Probably 2 concussions.    History of stroke: No CVA. PE long time ago.    History of heart attack: *** History of cancer/chemotherapy: no   History of seizure activity: No   Symptoms of chronic pain: 7/8 out of 10, for falls.    Experience of frequent headaches/migraines: NO frequent headaches.     Imaging/Lab Results: *** Past Medical History:  Diagnosis Date   ADD (attention deficit disorder)    Anxiety    Coronary artery disease    Depression    DVT (deep venous thrombosis) (HCC)    Fatigue    chronic   Fibromyalgia    Hyperlipidemia    Muscle spasm    bad   PE (pulmonary embolism)    PTSD (post-traumatic stress disorder)    Patient Active Problem List   Diagnosis Date Noted   Bunion of great toe of left foot 07/15/2023   Atherosclerosis of native coronary artery of native heart without angina pectoris 12/07/2018   Asymptomatic carotid artery stenosis, bilateral 11/16/2018   Dyslipidemia 11/16/2018   Angina pectoris 11/14/2018   Abnormal nuclear stress test 11/14/2018   Mixed hyperlipidemia 11/14/2018   Bilateral carotid bruits 11/12/2018   Fibromyalgia 02/27/2017   Primary insomnia 02/27/2017   Chronic midline low back pain with left-sided sciatica 02/27/2017   Long  term current use of non-steroidal anti-inflammatories (NSAID) 02/27/2017   History of psychotic reaction/ history of OCD / history of depression  02/27/2017   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) 01/17/2015   Rigors 01/17/2015   Multifocal myoclonus 01/17/2015   Progressive dementia with uncertain etiology (HCC) 01/17/2015   OCD (obsessive compulsive disorder) 10/16/2014   Delusional disorder (HCC) 10/16/2014   Mild neurocognitive disorder 10/10/2014   Mild benzodiazepine use disorder (HCC) 10/10/2014   Anxiety disorder 10/10/2014   Anxiety 08/29/2014   H/O deep venous thrombosis 08/29/2014   Healed or old pulmonary embolism 08/29/2014   Paranoia (HCC)    Family Neurologic/Medical Hx: *** Family History  Problem Relation Age of Onset   Cancer Mother        ovarian    Peripheral vascular disease Mother 78   Hyperlipidemia Father    Hypertension Father    Multiple myeloma Father  CAD Father        CABG age 34   Heart disease Father        Pacer age 25   Diabetes Maternal Grandmother    Cancer Maternal Grandmother        stomach   Arthritis Brother    Dementia Maternal Grandfather    Colon polyps Maternal Grandfather    CAD Paternal Grandmother    Diabetes Paternal Grandmother    Dementia Paternal Grandfather     Medications:  aspirin  (ASPIRIN  CHILDRENS) 81 MG chewable tablet bismuth subsalicylate (PEPTO BISMOL) 262 MG/15ML suspension Cholecalciferol (VITAMIN D ) 50 MCG (2000 UT) CAPS Coenzyme Q10 (COQ-10) 200 MG CAPS DULoxetine  (CYMBALTA ) 60 MG capsule ezetimibe  (ZETIA ) 10 MG tablet LORazepam  (ATIVAN ) 0.5 MG tablet - cut back on it, usually. just one at night. Couple months cutting back.  Magnesium  300 MG CAPS Menthol-Zinc Oxide (GOLD BOND EX)  metoprolol  succinate (TOPROL -XL) 25 MG 24 hr tablet -   nitroGLYCERIN  (NITROSTAT ) 0.4 MG SL tablet  pantoprazole  (PROTONIX ) 40 MG tablet (discontinued)  QUEtiapine   (SEROQUEL ) 100 MG tablet (was a change).   rosuvastatin  (CRESTOR ) 20 MG tablet tizanidine (ZANAFLEX) 2 MG capsule      Mental Status/Behavioral Observations: The patient was seen on an outpatient basis in the [facility] PM&R office for the clinical interview *** Sensorium/Arousal: ***   Orientation: ***   Appearance: ***   Behavior: ***   Speech/Language: ***   Motor: ***   Social Comportment: ***   Mood: ***   Affect: ***   Thought Process/Content: ***   Ability to Participate in Interview: ***   Insight: ***    SUMMARY / CLINICAL IMPRESSIONS ***  DISPOSITION / PLAN The patient has been set up for a formal neuropsychological assessment to objectively assess her cognitive functioning across domains to establish the patient's cognitive profile. This data, in conjunction with information obtained via clinical interview and medical record review, will help clarify likely etiology and guide treatment recommendations. Once data collection and interpretation have been completed, the findings / diagnosis and recommendations will be reviewed and discussed with the patient during a feedback appointment with the neuropsychologist. Based on the collaborative dialogue with the patient during the feedback, recommendations may be adjusted / tailored as needed. A formal report will be produced and provided to the patient and the referring provider.   Diagnosis:    FULL REPORT TO FOLLOW   Evalene DOROTHA Riff, PsyD [facility] [address] [place], [place] [address] Main: [phone] Fax: [phone] [place] License # [id number]  This report was generated using voice recognition software. While this document has been carefully reviewed, transcription errors may be present. I apologize in advance for any inconvenience. Please contact me if further clarification is needed.  "

## 2024-12-05 ENCOUNTER — Encounter

## 2024-12-12 ENCOUNTER — Encounter: Admitting: Psychology

## 2024-12-19 ENCOUNTER — Encounter: Admitting: Psychology
